# Patient Record
Sex: Male | Born: 1935
Health system: Southern US, Community
[De-identification: ages and names within clinical notes are randomized; demographics above are authoritative.]

## PROBLEM LIST (undated history)

## (undated) DIAGNOSIS — I10 Essential (primary) hypertension: Secondary | ICD-10-CM

## (undated) DIAGNOSIS — J479 Bronchiectasis, uncomplicated: Secondary | ICD-10-CM

## (undated) DIAGNOSIS — M75101 Unspecified rotator cuff tear or rupture of right shoulder, not specified as traumatic: Secondary | ICD-10-CM

## (undated) DIAGNOSIS — K649 Unspecified hemorrhoids: Secondary | ICD-10-CM

## (undated) DIAGNOSIS — K219 Gastro-esophageal reflux disease without esophagitis: Secondary | ICD-10-CM

## (undated) DIAGNOSIS — M7501 Adhesive capsulitis of right shoulder: Secondary | ICD-10-CM

## (undated) DIAGNOSIS — Z8619 Personal history of other infectious and parasitic diseases: Secondary | ICD-10-CM

## (undated) DIAGNOSIS — E119 Type 2 diabetes mellitus without complications: Secondary | ICD-10-CM

## (undated) DIAGNOSIS — M1990 Unspecified osteoarthritis, unspecified site: Secondary | ICD-10-CM

## (undated) DIAGNOSIS — I499 Cardiac arrhythmia, unspecified: Secondary | ICD-10-CM

## (undated) DIAGNOSIS — N402 Nodular prostate without lower urinary tract symptoms: Secondary | ICD-10-CM

## (undated) DIAGNOSIS — F419 Anxiety disorder, unspecified: Secondary | ICD-10-CM

## (undated) DIAGNOSIS — E785 Hyperlipidemia, unspecified: Secondary | ICD-10-CM

## (undated) HISTORY — DX: Nodular prostate without lower urinary tract symptoms: N40.2

## (undated) HISTORY — DX: Type 2 diabetes mellitus without complications: E11.9

## (undated) HISTORY — DX: Unspecified hemorrhoids: K64.9

## (undated) HISTORY — DX: Bronchiectasis, uncomplicated: J47.9

## (undated) HISTORY — DX: Unspecified rotator cuff tear or rupture of right shoulder, not specified as traumatic: M75.101

## (undated) HISTORY — DX: Anxiety disorder, unspecified: F41.9

## (undated) HISTORY — PX: HAND RECONSTRUCTION: SHX1730

## (undated) HISTORY — DX: Essential (primary) hypertension: I10

## (undated) HISTORY — DX: Personal history of other infectious and parasitic diseases: Z86.19

## (undated) HISTORY — DX: Adhesive capsulitis of right shoulder: M75.01

## (undated) HISTORY — DX: Hyperlipidemia, unspecified: E78.5

## (undated) HISTORY — DX: Unspecified osteoarthritis, unspecified site: M19.90

## (undated) HISTORY — DX: Gastro-esophageal reflux disease without esophagitis: K21.9

---

## 1993-06-23 HISTORY — PX: ROTATOR CUFF REPAIR: SHX139

## 1996-06-23 HISTORY — PX: COLONOSCOPY: SHX174

## 1998-04-27 ENCOUNTER — Other Ambulatory Visit: Admission: RE | Admit: 1998-04-27 | Discharge: 1998-04-27 | Payer: Self-pay | Admitting: Urology

## 2003-06-21 ENCOUNTER — Emergency Department (HOSPITAL_COMMUNITY): Admission: EM | Admit: 2003-06-21 | Discharge: 2003-06-21 | Payer: Self-pay | Admitting: Family Medicine

## 2004-06-23 HISTORY — PX: INGUINAL HERNIA REPAIR: SUR1180

## 2004-06-27 ENCOUNTER — Ambulatory Visit: Payer: Self-pay | Admitting: Pulmonary Disease

## 2004-10-24 ENCOUNTER — Ambulatory Visit (HOSPITAL_COMMUNITY): Admission: RE | Admit: 2004-10-24 | Discharge: 2004-10-24 | Payer: Self-pay | Admitting: General Surgery

## 2004-12-12 ENCOUNTER — Ambulatory Visit: Payer: Self-pay | Admitting: Pulmonary Disease

## 2005-05-01 ENCOUNTER — Ambulatory Visit: Payer: Self-pay | Admitting: Pulmonary Disease

## 2006-01-13 ENCOUNTER — Ambulatory Visit: Payer: Self-pay | Admitting: Pulmonary Disease

## 2006-10-21 ENCOUNTER — Ambulatory Visit: Payer: Self-pay | Admitting: Pulmonary Disease

## 2006-10-23 ENCOUNTER — Ambulatory Visit: Payer: Self-pay | Admitting: Pulmonary Disease

## 2006-10-23 LAB — CONVERTED CEMR LAB
ALT: 43 units/L — ABNORMAL HIGH (ref 0–40)
Alkaline Phosphatase: 60 units/L (ref 39–117)
Chloride: 109 meq/L (ref 96–112)
Creatinine, Ser: 1.1 mg/dL (ref 0.4–1.5)
Eosinophils Relative: 2.9 % (ref 0.0–5.0)
GFR calc Af Amer: 84 mL/min
Glucose, Bld: 121 mg/dL — ABNORMAL HIGH (ref 70–99)
HCT: 37.6 % — ABNORMAL LOW (ref 39.0–52.0)
Hemoglobin: 13.2 g/dL (ref 13.0–17.0)
MCHC: 35.2 g/dL (ref 30.0–36.0)
Monocytes Relative: 9 % (ref 3.0–11.0)
Neutro Abs: 3.2 10*3/uL (ref 1.4–7.7)
Neutrophils Relative %: 64.6 % (ref 43.0–77.0)
PSA: 1.91 ng/mL (ref 0.10–4.00)
Potassium: 4.3 meq/L (ref 3.5–5.1)
RBC: 4.1 M/uL — ABNORMAL LOW (ref 4.22–5.81)
RDW: 11.9 % (ref 11.5–14.6)
TSH: 3.03 microintl units/mL (ref 0.35–5.50)
Total Bilirubin: 0.8 mg/dL (ref 0.3–1.2)
Total Protein: 6.7 g/dL (ref 6.0–8.3)
VLDL: 8 mg/dL (ref 0–40)
WBC: 4.8 10*3/uL (ref 4.5–10.5)

## 2007-08-11 ENCOUNTER — Encounter: Payer: Self-pay | Admitting: Pulmonary Disease

## 2007-10-13 ENCOUNTER — Ambulatory Visit: Payer: Self-pay | Admitting: Internal Medicine

## 2007-10-13 DIAGNOSIS — I10 Essential (primary) hypertension: Secondary | ICD-10-CM | POA: Insufficient documentation

## 2007-10-13 DIAGNOSIS — E119 Type 2 diabetes mellitus without complications: Secondary | ICD-10-CM | POA: Insufficient documentation

## 2007-10-13 DIAGNOSIS — Z87898 Personal history of other specified conditions: Secondary | ICD-10-CM | POA: Insufficient documentation

## 2007-11-22 ENCOUNTER — Encounter: Payer: Self-pay | Admitting: Adult Health

## 2007-11-22 ENCOUNTER — Ambulatory Visit: Payer: Self-pay | Admitting: Internal Medicine

## 2007-11-22 LAB — CONVERTED CEMR LAB
Bilirubin Urine: NEGATIVE
Crystals: NEGATIVE
Ketones, ur: NEGATIVE mg/dL
Nitrite: NEGATIVE
Specific Gravity, Urine: 1.02 (ref 1.000–1.03)
pH: 5.5 (ref 5.0–8.0)

## 2007-12-21 ENCOUNTER — Ambulatory Visit: Payer: Self-pay | Admitting: Pulmonary Disease

## 2007-12-21 ENCOUNTER — Telehealth (INDEPENDENT_AMBULATORY_CARE_PROVIDER_SITE_OTHER): Payer: Self-pay | Admitting: *Deleted

## 2007-12-25 DIAGNOSIS — J42 Unspecified chronic bronchitis: Secondary | ICD-10-CM | POA: Insufficient documentation

## 2007-12-25 DIAGNOSIS — K649 Unspecified hemorrhoids: Secondary | ICD-10-CM | POA: Insufficient documentation

## 2007-12-25 DIAGNOSIS — N402 Nodular prostate without lower urinary tract symptoms: Secondary | ICD-10-CM | POA: Insufficient documentation

## 2007-12-25 LAB — CONVERTED CEMR LAB
AST: 29 units/L (ref 0–37)
Albumin: 4 g/dL (ref 3.5–5.2)
Alkaline Phosphatase: 53 units/L (ref 39–117)
BUN: 20 mg/dL (ref 6–23)
Basophils Relative: 0.2 % (ref 0.0–1.0)
Bilirubin, Direct: 0.1 mg/dL (ref 0.0–0.3)
Cholesterol: 130 mg/dL (ref 0–200)
Creatinine, Ser: 1.1 mg/dL (ref 0.4–1.5)
Eosinophils Absolute: 0.2 10*3/uL (ref 0.0–0.7)
Eosinophils Relative: 2.5 % (ref 0.0–5.0)
Glucose, Bld: 96 mg/dL (ref 70–99)
HCT: 39.1 % (ref 39.0–52.0)
Hemoglobin: 13.3 g/dL (ref 13.0–17.0)
Hgb A1c MFr Bld: 6.3 % — ABNORMAL HIGH (ref 4.6–6.0)
Monocytes Absolute: 0.4 10*3/uL (ref 0.1–1.0)
Monocytes Relative: 7 % (ref 3.0–12.0)
Neutro Abs: 4.7 10*3/uL (ref 1.4–7.7)
Neutrophils Relative %: 73.8 % (ref 43.0–77.0)
RDW: 12.8 % (ref 11.5–14.6)
TSH: 1.57 microintl units/mL (ref 0.35–5.50)
Total Protein: 7.3 g/dL (ref 6.0–8.3)

## 2008-07-11 ENCOUNTER — Telehealth (INDEPENDENT_AMBULATORY_CARE_PROVIDER_SITE_OTHER): Payer: Self-pay | Admitting: *Deleted

## 2008-10-13 ENCOUNTER — Encounter: Payer: Self-pay | Admitting: Pulmonary Disease

## 2009-02-12 ENCOUNTER — Ambulatory Visit: Payer: Self-pay | Admitting: Internal Medicine

## 2009-02-22 ENCOUNTER — Ambulatory Visit (HOSPITAL_COMMUNITY): Admission: RE | Admit: 2009-02-22 | Discharge: 2009-02-22 | Payer: Self-pay | Admitting: Orthopedic Surgery

## 2009-06-21 ENCOUNTER — Emergency Department (HOSPITAL_COMMUNITY): Admission: EM | Admit: 2009-06-21 | Discharge: 2009-06-21 | Payer: Self-pay | Admitting: Family Medicine

## 2009-07-09 ENCOUNTER — Telehealth (INDEPENDENT_AMBULATORY_CARE_PROVIDER_SITE_OTHER): Payer: Self-pay | Admitting: *Deleted

## 2009-07-10 ENCOUNTER — Ambulatory Visit: Payer: Self-pay | Admitting: Pulmonary Disease

## 2009-07-11 ENCOUNTER — Encounter: Payer: Self-pay | Admitting: Adult Health

## 2009-07-12 LAB — CONVERTED CEMR LAB
Alkaline Phosphatase: 63 units/L (ref 39–117)
Bilirubin Urine: NEGATIVE
Bilirubin, Direct: 0.1 mg/dL (ref 0.0–0.3)
Calcium: 9.3 mg/dL (ref 8.4–10.5)
Eosinophils Absolute: 0.1 10*3/uL (ref 0.0–0.7)
GFR calc non Af Amer: 83.44 mL/min (ref >60)
HDL: 40.3 mg/dL (ref >39.00)
Leukocytes, UA: NEGATIVE
Lymphocytes Relative: 16.9 % (ref 12.0–46.0)
Lymphs Abs: 0.8 10*3/uL (ref 0.7–4.0)
MCHC: 33.7 g/dL (ref 30.0–36.0)
MCV: 92.9 fL (ref 78.0–100.0)
Neutrophils Relative %: 70.5 % (ref 43.0–77.0)
Platelets: 196 10*3/uL (ref 150.0–400.0)
RBC: 4.04 M/uL — ABNORMAL LOW (ref 4.22–5.81)
Sodium: 139 meq/L (ref 135–145)
Total Bilirubin: 0.8 mg/dL (ref 0.3–1.2)
Total Protein, Urine: NEGATIVE mg/dL
Total Protein: 7.6 g/dL (ref 6.0–8.3)
Triglycerides: 141 mg/dL (ref 0.0–149.0)
Urine Glucose: NEGATIVE mg/dL
Urobilinogen, UA: 0.2 (ref 0.0–1.0)
VLDL: 28.2 mg/dL (ref 0.0–40.0)

## 2009-09-11 ENCOUNTER — Encounter: Payer: Self-pay | Admitting: Pulmonary Disease

## 2009-10-08 ENCOUNTER — Encounter: Admission: RE | Admit: 2009-10-08 | Discharge: 2010-01-06 | Payer: Self-pay | Admitting: Pulmonary Disease

## 2009-10-09 ENCOUNTER — Encounter: Payer: Self-pay | Admitting: Pulmonary Disease

## 2010-02-26 ENCOUNTER — Telehealth (INDEPENDENT_AMBULATORY_CARE_PROVIDER_SITE_OTHER): Payer: Self-pay | Admitting: *Deleted

## 2010-02-28 ENCOUNTER — Ambulatory Visit: Payer: Self-pay | Admitting: Pulmonary Disease

## 2010-03-01 ENCOUNTER — Ambulatory Visit: Payer: Self-pay | Admitting: Pulmonary Disease

## 2010-03-02 LAB — CONVERTED CEMR LAB
ALT: 26 units/L (ref 0–53)
Albumin: 4.1 g/dL (ref 3.5–5.2)
BUN: 24 mg/dL — ABNORMAL HIGH (ref 6–23)
Basophils Absolute: 0 10*3/uL (ref 0.0–0.1)
Bilirubin Urine: NEGATIVE
Chloride: 106 meq/L (ref 96–112)
Creatinine, Ser: 1.1 mg/dL (ref 0.4–1.5)
Eosinophils Relative: 2.7 % (ref 0.0–5.0)
HCT: 36.6 % — ABNORMAL LOW (ref 39.0–52.0)
Hemoglobin, Urine: NEGATIVE
Hemoglobin: 12.6 g/dL — ABNORMAL LOW (ref 13.0–17.0)
Ketones, ur: NEGATIVE mg/dL
Lymphocytes Relative: 22.6 % (ref 12.0–46.0)
Lymphs Abs: 1.2 10*3/uL (ref 0.7–4.0)
MCHC: 34.3 g/dL (ref 30.0–36.0)
MCV: 92.3 fL (ref 78.0–100.0)
Monocytes Relative: 8.8 % (ref 3.0–12.0)
Neutro Abs: 3.4 10*3/uL (ref 1.4–7.7)
Potassium: 4.7 meq/L (ref 3.5–5.1)
RBC: 3.96 M/uL — ABNORMAL LOW (ref 4.22–5.81)
Sodium: 140 meq/L (ref 135–145)
Specific Gravity, Urine: 1.025 (ref 1.000–1.030)
TSH: 1.3 microintl units/mL (ref 0.35–5.50)
Total Protein: 7.1 g/dL (ref 6.0–8.3)
Triglycerides: 66 mg/dL (ref 0.0–149.0)
Urine Glucose: NEGATIVE mg/dL

## 2010-03-04 ENCOUNTER — Encounter: Payer: Self-pay | Admitting: Pulmonary Disease

## 2010-05-02 ENCOUNTER — Telehealth (INDEPENDENT_AMBULATORY_CARE_PROVIDER_SITE_OTHER): Payer: Self-pay | Admitting: *Deleted

## 2010-05-03 ENCOUNTER — Ambulatory Visit: Payer: Self-pay | Admitting: Pulmonary Disease

## 2010-07-01 ENCOUNTER — Encounter: Payer: Self-pay | Admitting: Pulmonary Disease

## 2010-07-23 NOTE — Letter (Signed)
Summary: [facility]   Imported By: Lennie Odor 10/16/2009 14:00:32  _____________________________________________________________________  External Attachment:    Type:   Image     Comment:   External Document

## 2010-07-23 NOTE — Letter (Signed)
Summary: [facility]  [facility]   Imported By: Lester Lititz 03/12/2010 09:41:24  _____________________________________________________________________  External Attachment:    Type:   Image     Comment:   External Document

## 2010-07-23 NOTE — Letter (Signed)
Summary: [facility]   Imported By: Sherian Rein 09/19/2009 13:56:51  _____________________________________________________________________  External Attachment:    Type:   Image     Comment:   External Document

## 2010-07-23 NOTE — Assessment & Plan Note (Signed)
Summary: rov/ mbw   CC:  follow up for health assesment for [facility] insurance.  c/o dry cough x2days.  Pt is fasting today.Marland Kitchen  History of Present Illness: 77   year old male with known history of DM, HTN, GERD    07/10/09--Presents for routine follow up. Needs work [employer] education [employer]. Has not been in for some time. Says he has been doing very well. Still working full time. Loves to work he says. Does not check blood sugars at home. Labs are due today- he is fasting. Denies chest pain, dyspnea, orthopnea, hemoptysis, fever, n/v/d, edema, headache.   Current Medications (verified): 1)  Bayer Childrens Aspirin 81 Mg Chew (Aspirin) .... Once Daily 2)  Lisinopril 10 Mg  Tabs (Lisinopril) .Marland Kitchen.. 1 By Mouth Once Daily 3)  Lipitor 20 Mg  Tabs (Atorvastatin Calcium) .... Take 1 Tablet By Mouth Once A Day 4)  Glucophage 500 Mg  Tabs (Metformin Hcl) .... Take 1 Tablet By Mouth Two Times A Day 5)  Gnp Omeprazole 20 Mg Tbec (Omeprazole) .... Take 1 Tablet By Mouth Once A Day 6)  Multivitamins   Tabs (Multiple Vitamin) .... Once Daily  Allergies (verified): No Known Drug Allergies  Past History:  Past Surgical History: Last updated: 12/21/2007 S/P left rotator cuff surgery in 1995 by DrPresson S/P right inguinal hernia repair in 2006 by DrBallen  Family History: Last updated: 02/12/2009 emphysema - cousin heart disease - father died from MI at 46  Social History: Last updated: 02/12/2009 Works for security, [employer] full time Married  never smoked no alcohol no caffeine  Risk Factors: Smoking Status: never (10/13/2007)  Past Medical History: Hx of BRONCHITIS, RECURRENT (ICD-491.9) - he is a never smoker... no recent bronchitic symptoms...  HYPERTENSION (ICD-401.9) - on LISINOPRIL 10mg /d...    HYPERLIPIDEMIA (ICD-272.4) - on LIPITOR 20mg /d... tolerating well...  ~  FLP 7/07 showed TChol 114, TG 47, HDL 41, LDL 64...  ~  FLP 5/08 showed TChol 107, TG 39, HDL 37, LDL 63...  DIABETES  MELLITUS (ICD-250.00) - on METFORMIN 500mg Bid...   ~  labs 6/06 showed BS= 126, HgA1c= 6.1.Marland Kitchen.  ~  labs 5/08 showed BS= 121, HgA1c= 6.8...  GERD (ICD-530.81) - on PRILOSEC 20mg /d...   Hx of HEMORRHOIDS (ICD-455.6) - last colonoscopy 6/98 by Dorris Singh was a normal study and f/u suggested in 10 yrs.  Hx of NODULAR PROSTATE WITHOUT URINARY OBSTRUCTION (ICD-600.10) - hx of nodular prostate w/ neg biopsies by DrNesi in 1999...  DEGENERATIVE JOINT DISEASE (ICD-715.90)  ~  s/p left rotator cuff repair 1995 by DrPresson...  ~  hx of distal left radius fracture in 2000 set by DrDuda...  ~  eval for back pain by DrBrooks 8/08... rec phys therapy & improved...  ANXIETY (ICD-300.00)  SHINGLES, HX OF (ICD-V13.8) - hx of right L1 shingles in 2004... also hx of seb dermatitis [facility] in [facility]...    Review of Systems      See HPI  Vital Signs:  Patient profile:   77 year old male Height:      71 inches Weight:      179.13 pounds BMI:     25.07 O2 Sat:      98 % on Room air Temp:     97.6 degrees F oral Pulse rate:   81 / minute BP sitting:   126 / 70  (left arm) Cuff size:   regular  Vitals Entered By: Gweneth Dimitri RN (July 10, 2009 2:59 PM)  O2 Flow:  Room air CC: follow up for health assesment for [facility] insurance.  c/o dry cough x2days.  Pt is fasting today. Is Patient Diabetic? Yes Comments Medications reviewed with patient phone number verified with pt Gweneth Dimitri RN  July 10, 2009 3:00 PM    Physical Exam  Additional Exam:  WD, WN, 77y/o BM in NAD... GENERAL:  Alert & oriented; [place] & cooperative... HEENT:  [place]/AT, EOM-wnl, PERRLA, EACs-clear, TMs-wnl, NOSE-clear, THROAT-clear & wnl. NECK:  Supple w/ full ROM; no JVD; normal carotid impulses w/o bruits; no thyromegaly or nodules palpated; no lymphadenopathy. CHEST:  Clear to P & A; without wheezes/ rales/ or rhonchi. HEART:  Regular Rhythm; without murmurs/ rubs/ or gallops. ABDOMEN:  Soft & nontender; normal  bowel sounds; no organomegaly or masses detected.  EXT: without deformities, mild arthritic changes; no varicose veins/ venous insuffic/ or edema. NEURO:  CN's intact; no focal neuro deficits... DERM:  No lesions noted; no rash etc...     Impression & Recommendations:  Problem # 1:  DIABETES MELLITUS (ICD-250.00) diet rx  labs pending.  health assessment sheet completed.  His updated medication list for this problem includes:    Bayer Childrens Aspirin 81 Mg Chew (Aspirin) ..... Once daily    Glucophage 500 Mg Tabs (Metformin hcl) .Marland Kitchen... Take 1 tablet by mouth two times a day  Orders: TLB-A1C / Hgb A1C (Glycohemoglobin) (83036-A1C) TLB-Udip ONLY (81003-UDIP) TLB-PSA (Prostate Specific Antigen) (84153-PSA) Est. Patient Level IV (78295)  Problem # 2:  HYPERTENSION (ICD-401.9) controlled on rx  His updated medication list for this problem includes:    Lisinopril 10 Mg Tabs (Lisinopril) .Marland Kitchen... 1 by mouth once daily  Orders: TLB-BMP (Basic Metabolic Panel-BMET) (80048-METABOL) TLB-CBC Platelet - w/Differential (85025-CBCD) TLB-PSA (Prostate Specific Antigen) (84153-PSA) Est. Patient Level IV (62130)  BP today: 126/70 Prior BP: 134/74 (02/12/2009)  Labs Reviewed: K+: 4.2 (12/21/2007) Creat: : 1.1 (12/21/2007)   Chol: 130 (12/21/2007)   HDL: 51.8 (12/21/2007)   LDL: 69 (12/21/2007)   TG: 46 (12/21/2007)  Problem # 3:  HYPERLIPIDEMIA (ICD-272.4) LDL goal <70-100 at goal cont on same meds along diet and exercise.  His updated medication list for this problem includes:    Lipitor 20 Mg Tabs (Atorvastatin calcium) .Marland Kitchen... Take 1 tablet by mouth once a day  Orders: TLB-Hepatic/Liver Function Pnl (80076-HEPATIC) TLB-Lipid Panel (80061-LIPID) TLB-PSA (Prostate Specific Antigen) (84153-PSA) Est. Patient Level IV (86578)  Labs Reviewed: SGOT: 29 (12/21/2007)   SGPT: 29 (12/21/2007)   HDL:51.8 (12/21/2007), 36.6 (10/23/2006)  LDL:69 (12/21/2007), 63 (10/23/2006)  Chol:130  (12/21/2007), 107 (10/23/2006)  Trig:46 (12/21/2007), 39 (10/23/2006)  Problem # 4:  DIABETES MELLITUS (ICD-250.00) at goal labs pending  diet and exercise.  His updated medication list for this problem includes:    Bayer Childrens Aspirin 81 Mg Chew (Aspirin) ..... Once daily    Glucophage 500 Mg Tabs (Metformin hcl) .Marland Kitchen... Take 1 tablet by mouth two times a day  Orders: TLB-A1C / Hgb A1C (Glycohemoglobin) (83036-A1C) TLB-Udip ONLY (81003-UDIP) TLB-PSA (Prostate Specific Antigen) (84153-PSA) Est. Patient Level IV (46962)  Labs Reviewed: Creat: 1.1 (12/21/2007)    Reviewed HgBA1c results: 6.3 (12/21/2007)  6.8 (10/23/2006)  Medications Added to Medication List This Visit: 1)  Bayer Childrens Aspirin 81 Mg Chew (Aspirin) .... Once daily  Complete Medication List: 1)  Bayer Childrens Aspirin 81 Mg Chew (Aspirin) .... Once daily 2)  Lisinopril 10 Mg Tabs (Lisinopril) .Marland Kitchen.. 1 by mouth once daily 3)  Lipitor 20 Mg Tabs (Atorvastatin calcium) .... Take 1 tablet by mouth  once a day 4)  Glucophage 500 Mg Tabs (Metformin hcl) .... Take 1 tablet by mouth two times a day 5)  Gnp Omeprazole 20 Mg Tbec (Omeprazole) .... Take 1 tablet by mouth once a day 6)  Multivitamins Tabs (Multiple vitamin) .... Once daily  Patient Instructions: 1)  Continue on same meds.  2)  Mucinex DM two times a day as needed cough/congestion 3)  I will call with lab results.  4)  Please contact office for sooner follow up if symptoms do not improve or worsen

## 2010-07-23 NOTE — Progress Notes (Signed)
Summary: ELEVATED bun  Phone Note Call from Patient   Caller: Patient Call For: NADEL Summary of Call: PT CALLED BUT HAD HIS CO-WORKER LYNN BERRYHIL SPEAK FOR HIM RE: RECENT LABS DONE AT [facility] (A MONTH AGO) FOR EMPLOYEE HEALTH SCREENING. BUN WAS ELEVATED- 120.0. CALL PT ON HIS CELL [phone]. LYNN CAN BE REACHED AT [phone] OR CELL [phone] (AGAIN- SHE IS NOT A NURSE BUT PT'S CO WORKER WHO ENCOURAGED HIM TO GET DR NADEL TO CHECK THIS OUT).  Initial call taken by: Tivis Ringer, CNA,  May 02, 2010 10:36 AM  Follow-up for Phone Call        Pt reports that his BUN was 120 at his health screening lab appt for [facility].  Last BUN in 02/2010 was 24.  Pt denies problems.  Pt sees Dr Brunilda Payor for BPH .  Please advise if pt to see Kriste Basque or needs f/u with Nesi. Abigail Miyamoto RN  May 02, 2010 10:50 AM   Additional Follow-up for Phone Call Additional follow up Details #1::        per SN---recs are for pt to bring [place] a copy of his health screening and while he is here we will send him to the lab for repeat BMP--401.9  thanks Randell Loop [facility]  May 02, 2010 12:13 PM     Additional Follow-up for Phone Call Additional follow up Details #2::    Clarification:  Pt had health screening labs faxed to our office (see attached) and BUN was ony 28 and Glucose was 120.  Pt got the two mixed up.  Please advise if pt should still have repeat BMP. Abigail Miyamoto RN  May 02, 2010 1:28 PM   Per Dr Kriste Basque - No need to have labs repeated.  Levels okay.  Pt informed. Abigail Miyamoto RN  May 02, 2010 1:33 PM

## 2010-07-23 NOTE — Progress Notes (Signed)
Summary: needs an apt to see nadel  Phone Note Call from Patient Call back at Home Phone [phone]   Caller: Patient Call For: nadel Summary of Call: patient works for [employer]. he did not get the health assesment through [place] (the buss). his insurance went up. he was told he had to come here and get his health assement. is there any way we can get him in any sooner than march.  Initial call taken by: Valinda Hoar,  July 09, 2009 12:16 PM  Follow-up for Phone Call        Please advise thanks Vernie Murders  July 09, 2009 12:18 PM'   Additional Follow-up for Phone Call Additional follow up Details #1::        ok to come in on 2-21 at 3---tel him to call 1 week prior to ov and he can come in for labs. Randell Loop [facility]  July 09, 2009 12:31 PM   pt states he has to have paperwork in to [employer] by Feb. 3rd. Is there and earlier appt. Thanks. Carron Curie CMA  July 09, 2009 12:54 PM     Additional Follow-up for Phone Call Additional follow up Details #2::    the only other day that i can offer is 2-2 at 3:30 unless he wants to see TP.  thanks Randell Loop CMA  July 09, 2009 1:00 PM   Called to offer appt.  Pt's spouse had already sched appt for tommorrow 1/18 with TP. Follow-up by: Vernie Murders,  July 09, 2009 1:54 PM

## 2010-07-23 NOTE — Assessment & Plan Note (Signed)
Summary: ov to get meds refilled//td   CC:  2 year ROV & review of medical problems....  History of Present Illness: 77 y/o BM here for a follow up visit... he has multiple medical problems as noted below...    ~  March 01, 2010:  2 yr ROV- doing well he says w/o new complaints or concerns... mild arthritic symptoms but still works full time at 58 as [employer]- walking, stairs, etc... denies CP, palpit, SOB, cough, phlegm, GI or GU symptoms... he states that BP, Chol, BS, etc are all doing well... he will ret for fasting labs & CXR>    Current Problem List:  Hx of BRONCHITIS, RECURRENT (ICD-491.9) - he is a never smoker... no recent bronchitic symptoms...  ~  CXR 6/09 showed hyperaeration, clear, NAD- DJD in spine.  ~  CXR 9/11> pt forgot to go to basement for film (too busy & had to get back to work).  HYPERTENSION (ICD-401.9) - on LISINOPRIL 10mg /d... BP= 142/70 and doing well... denies HA, fatigue, visual changes, CP, palipit, dizziness, syncope, dyspnea, edema, etc...  HYPERLIPIDEMIA (ICD-272.4) - on LIPITOR 20mg /d... tolerating well...  ~  FLP 7/07 showed TChol 114, TG 47, HDL 41, LDL 64  ~  FLP 5/08 showed TChol 107, TG 39, HDL 37, LDL 63  ~  FLP 6/09 showed TChol 130, TG 46, HDL 52, LDL 69  ~  FLP 1/11 showed TChol 160, TG 141, HDL 40, LDL 92  ~  FLP 9/11 showed TChol 123, TG 66, HDL 37, LDL 73  DIABETES MELLITUS (ICD-250.00) - on METFORMIN 500mg Bid...   ~  labs 6/06 showed BS= 126, HgA1c= 6.1  ~  labs 5/08 showed BS= 121, HgA1c= 6.8  ~  labs 6/09 showed BS= 96, A1c= 6.3  ~  labs 1/11 showed BS= 111, A1c= 7.2  ~  labs 9/11 showed BS= 94, A1c= 7.5.Marland KitchenMarland Kitchen needs better diet, get wt down, or incr meds!  GERD (ICD-530.81) - prev Prilosec OTC changed to NEXIUM 40mg /d ("it's free at hosp pharm").  Hx of HEMORRHOIDS (ICD-455.6) - last colonoscopy 6/98 by Dorris Singh was a normal study and f/u suggested in 10 yrs & he is overdue> refer to GI.  Hx of NODULAR PROSTATE  WITHOUT URINARY OBSTRUCTION (ICD-600.10) - hx of nodular prostate w/ neg biopsies by DrNesi in 1999...   ~  9/11: he reports yearly f/u DrNesi due next week, ?on FLOMAX 0.4mg /d- we don't have records.  DEGENERATIVE JOINT DISEASE (ICD-715.90) - he uses OTC anti-inflamm meds Prn.  ~  s/p left rotator cuff repair 1995 by DrPresson...  ~  hx of distal left radius fracture in 2000 set by DrDuda...  ~  eval for back pain by DrBrooks 8/08... rec phys therapy & improved...  ANXIETY (ICD-300.00)  SHINGLES, HX OF (ICD-V13.8) - hx of right L1 shingles in 2004... also hx of seb dermatitis eval at [facility] in 2002...   Preventive Screening-Counseling & Management  Alcohol-Tobacco     Smoking Status: never  Allergies (verified): No Known Drug Allergies  Comments:  Nurse/Medical Assistant: The patient's medications and allergies were reviewed with the patient and were updated in the Medication and Allergy Lists.  Past History:  Past Medical History: Hx of BRONCHITIS, RECURRENT (ICD-491.9) HYPERTENSION (ICD-401.9) HYPERLIPIDEMIA (ICD-272.4) DIABETES MELLITUS (ICD-250.00) GERD (ICD-530.81) Hx of HEMORRHOIDS (ICD-455.6) Hx of NODULAR PROSTATE WITHOUT URINARY OBSTRUCTION (ICD-600.10) DEGENERATIVE JOINT DISEASE (ICD-715.90) ANXIETY (ICD-300.00) SHINGLES, HX OF (ICD-V13.8)  Past Surgical History: S/P left rotator cuff surgery in 1995 by  DrPresson S/P right inguinal hernia repair in 2006 by DrBallen  Family History: Reviewed history from 02/12/2009 and no changes required. emphysema - cousin heart disease - father died from MI at 57  Social History: Reviewed history from 02/12/2009 and no changes required. Works for security, [employer] full time Married  never smoked no alcohol no caffeine  Review of Systems       The patient complains of nocturia and arthritis.  The patient denies fever, chills, sweats, anorexia, fatigue, weakness, malaise, weight loss, sleep disorder, blurring,  diplopia, eye irritation, eye discharge, vision loss, eye pain, photophobia, earache, ear discharge, tinnitus, decreased hearing, nasal congestion, nosebleeds, sore throat, hoarseness, chest pain, palpitations, syncope, dyspnea on exertion, orthopnea, PND, peripheral edema, cough, dyspnea at rest, excessive sputum, hemoptysis, wheezing, pleurisy, nausea, vomiting, diarrhea, constipation, change in bowel habits, abdominal pain, melena, hematochezia, jaundice, gas/bloating, indigestion/heartburn, dysphagia, odynophagia, dysuria, hematuria, urinary frequency, urinary hesitancy, incontinence, back pain, joint pain, joint swelling, muscle cramps, muscle weakness, stiffness, sciatica, restless legs, leg pain at night, leg pain with exertion, rash, itching, dryness, suspicious lesions, paralysis, paresthesias, seizures, tremors, vertigo, transient blindness, frequent falls, frequent headaches, difficulty walking, depression, anxiety, memory loss, confusion, cold intolerance, heat intolerance, polydipsia, polyphagia, polyuria, unusual weight change, abnormal bruising, bleeding, enlarged lymph nodes, urticaria, allergic rash, hay fever, and recurrent infections.    Vital Signs:  Patient profile:   77 year old male Height:      71 inches Weight:      178.50 pounds BMI:     24.99 O2 Sat:      97 % on Room air Temp:     97.0 degrees F oral Pulse rate:   95 / minute BP sitting:   142 / 70  (left arm) Cuff size:   regular  Vitals Entered By: Randell Loop CMA (February 28, 2010 11:37 AM)  O2 Sat at Rest %:  97 O2 Flow:  Room air CC: 2 year ROV & review of medical problems... Is Patient Diabetic? Yes Pain Assessment Patient in pain? no      Comments meds updated today with pt   Physical Exam  Additional Exam:  WD, WN, 77 y/o BM in NAD... GENERAL:  Alert & oriented; [place] & cooperative... HEENT:  [place]/AT, EOM-wnl, PERRLA, EACs-clear, TMs-wnl, NOSE-clear, THROAT-clear & wnl. NECK:  Supple w/  fairROM; no JVD; normal carotid impulses w/o bruits; no thyromegaly or nodules palpated; no lymphadenopathy. CHEST:  Clear to P & A; without wheezes/ rales/ or rhonchi. HEART:  Regular Rhythm; without murmurs/ rubs/ or gallops. ABDOMEN:  Soft & nontender; normal bowel sounds; no organomegaly or masses detected.  EXT: without deformities, mild arthritic changes; no varicose veins/ venous insuffic/ or edema. NEURO:  CN's intact; no focal neuro deficits... DERM:  No lesions noted; no rash etc...    MISC. Report  Procedure date:  03/01/2010  Findings:      Lipid Panel (LIPID)   Cholesterol               123 mg/dL                   9-563   Triglycerides             66.0 mg/dL                  8.7-564.3   HDL                  [L]  32.95 mg/dL                 >  39.00   LDL Cholesterol           73 mg/dL                    1-69  Hepatic/Liver Function Panel (HEPATIC)   Total Bilirubin           1.0 mg/dL                   6.7-8.9   Direct Bilirubin          0.2 mg/dL                   3.8-1.0   Alkaline Phosphatase      59 U/L                      39-117   AST                       28 U/L                      0-37   ALT                       26 U/L                      0-53   Total Protein             7.1 g/dL                    1.7-5.1   Albumin                   4.1 g/dL                    0.2-5.8  BMP (METABOL)   Sodium                    140 mEq/L                   135-145   Potassium                 4.7 mEq/L                   3.5-5.1   Chloride                  106 mEq/L                   96-112   Carbon Dioxide            27 mEq/L                    19-32   Glucose                   94 mg/dL                    52-77   BUN                  [H]  24 mg/dL                    8-24   Creatinine                1.1 mg/dL  0.4-1.5   Calcium                   9.2 mg/dL                   1.6-10.9   GFR                       79.94 mL/min                >60   Hemoglobin A1C  (A1C)   Hemoglobin A1C       [H]  7.5 %                       4.6-6.5  Comments:      CBC Platelet w/Diff (CBCD)   White Cell Count          5.2 K/uL                    4.5-10.5   Red Cell Count       [L]  3.96 Mil/uL                 4.22-5.81   Hemoglobin           [L]  12.6 g/dL                   60.4-54.0   Hematocrit           [L]  36.6 %                      39.0-52.0   MCV                       92.3 fl                     78.0-100.0   Platelet Count            176.0 K/uL                  150.0-400.0   Neutrophil %              65.5 %                      43.0-77.0   Lymphocyte %              22.6 %                      12.0-46.0   Monocyte %                8.8 %                       3.0-12.0   Eosinophils%              2.7 %                       0.0-5.0   Basophils %               0.4 %                       0.0-3.0   TSH (TSH)   FastTSH  1.30 uIU/mL                 0.35-5.50  Prostate Specific Antigen (PSA)   PSA-Hyb                   1.27 ng/mL                  0.10-4.00  UDip Only (UDIP)   Color                     LT. YELLOW   Clarity                   CLEAR                       Clear   Specific Gravity          1.025                       1.000 - 1.030   Urine Ph                  5.5                         5.0-8.0   Protein                   NEGATIVE                    Negative   Urine Glucose             NEGATIVE                    Negative   Ketones                   NEGATIVE                    Negative   Urine Bilirubin           NEGATIVE                    Negative   Blood                     NEGATIVE                    Negative   Urobilinogen              0.2                         0.0 - 1.0   Leukocyte Esterace        NEGATIVE                    Negative   Nitrite                   NEGATIVE                    Negative   Impression & Recommendations:  Problem # 1:  DEGENERATIVE JOINT DISEASE (ICD-715.90) 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 he uses OTC anti-inflamm Prn & doesn't want stronger med. His updated medication list for this problem includes:    Bayer Childrens Aspirin 81 Mg Chew (Aspirin) .Marland KitchenMarland KitchenMarland KitchenMarland Kitchen  Once daily  Problem # 2:  HYPERTENSION (ICD-401.9) Controlled>  same meds + diet (no salt, get wt down)... His updated medication list for this problem includes:    Lisinopril 10 Mg Tabs (Lisinopril) .Marland Kitchen... 1 by mouth once daily  Problem # 3:  HYPERLIPIDEMIA (ICD-272.4) Lipids well controlled on diet + Lip20... His updated medication list for this problem includes:    Lipitor 20 Mg Tabs (Atorvastatin calcium) .Marland Kitchen... Take 1 tablet by mouth once a day  Problem # 4:  DIABETES MELLITUS (ICD-250.00) His BS/ A1c were up w/ weight gain> hopefully won't need incr meds, just diet/ exerc/ get wt down... His updated medication list for this problem includes:    Bayer Childrens Aspirin 81 Mg Chew (Aspirin) ..... Once daily    Lisinopril 10 Mg Tabs (Lisinopril) .Marland Kitchen... 1 by mouth once daily    Glucophage 500 Mg Tabs (Metformin hcl) .Marland Kitchen... Take 1 tablet by mouth two times a day  Problem # 5:  GERD (ICD-530.81) Stable>  continue Nexium... His updated medication list for this problem includes:    Nexium 40 Mg Cpdr (Esomeprazole magnesium) .Marland Kitchen... Take one tablet by mouth once daily  Problem # 6:  Hx of NODULAR PROSTATE WITHOUT URINARY OBSTRUCTION (ICD-600.10) Followed by Thermon Leyland w/ appt due next week he says... asked to request notes to [place] for review. His updated medication list for this problem includes:    Tamsulosin Hcl 0.4 Mg Caps (Tamsulosin hcl) .Marland Kitchen... Take 1 tab by mouth once daily as directed by drnesi...  Problem # 7:  ANXIETY (ICD-300.00) He manages quite well & does not want anxiolytic med.  Problem # 8:  OTHER MEDICAL PROBLEMS AS NOTED>>>  Complete Medication List: 1)  Bayer Childrens Aspirin 81 Mg Chew (Aspirin) .... Once daily 2)  Lisinopril 10 Mg Tabs (Lisinopril) .Marland Kitchen.. 1 by mouth once  daily 3)  Lipitor 20 Mg Tabs (Atorvastatin calcium) .... Take 1 tablet by mouth once a day 4)  Glucophage 500 Mg Tabs (Metformin hcl) .... Take 1 tablet by mouth two times a day 5)  Nexium 40 Mg Cpdr (Esomeprazole magnesium) .... Take one tablet by mouth once daily 6)  Multivitamins Tabs (Multiple vitamin) .... Once daily 7)  Vitamin D 1000 Unit Tabs (Cholecalciferol) .... Take 1 tablet by mouth once a day 8)  Fluocinonide 0.05 % Gel (Fluocinonide) .... Apply as directed 9)  Tamsulosin Hcl 0.4 Mg Caps (Tamsulosin hcl) .... Take 1 tab by mouth once daily as directed by drnesi...  Patient Instructions: 1)  Today we updated your med list- see below.... 2)  We refilled your meds per request... 3)  Please return to our lab [facility] for your FASTING blood work, and your CXR.Marland KitchenMarland Kitchen  4)  Thwen please call the "phone tree" in a few days for your lab results.Marland KitchenMarland Kitchen  5)  Let's work on getting those 20 lbs back off... 6)  Call for any questions.Marland KitchenMarland Kitchen 7)  Please schedule a follow-up appointment in 1 year, sooner as needed... Prescriptions: NEXIUM 40 MG CPDR (ESOMEPRAZOLE MAGNESIUM) take one tablet by mouth once daily  #90 x 4   Entered and Authorized by:   Michele Mcalpine MD   Signed by:   Michele Mcalpine MD on 02/28/2010   Method used:   Print then Give to Patient   RxID:   [id number] GLUCOPHAGE 500 MG  TABS (METFORMIN HCL) Take 1 tablet by mouth two times a day  #180 x 4   Entered and Authorized by:   Lonzo Cloud  Kriste Basque MD   Signed by:   Michele Mcalpine MD on 02/28/2010   Method used:   Print then Give to Patient   RxID:   [id number] LIPITOR 20 MG  TABS (ATORVASTATIN CALCIUM) Take 1 tablet by mouth once a day  #90 x 4   Entered and Authorized by:   Michele Mcalpine MD   Signed by:   Michele Mcalpine MD on 02/28/2010   Method used:   Print then Give to Patient   RxID:   [id number] LISINOPRIL 10 MG  TABS (LISINOPRIL) 1 by mouth once daily  #90 x 4   Entered and Authorized by:   Michele Mcalpine MD   Signed  by:   Michele Mcalpine MD on 02/28/2010   Method used:   Print then Give to Patient   RxID:   [id number]    Immunization History:  Influenza Immunization History:    Influenza:  historical (05/16/2009)  Pneumovax Immunization History:    Pneumovax:  historical (03/18/2006)

## 2010-07-23 NOTE — Letter (Signed)
Summary: [facility]  [facility]   Imported By: Lester Grasston 07/17/2009 12:25:52  _____________________________________________________________________  External Attachment:    Type:   Image     Comment:   External Document

## 2010-07-23 NOTE — Progress Notes (Signed)
Summary: refill appt needed asap  Phone Note Call from Patient Call back at [phone]   Caller: Patient Call For: nadel Summary of Call: pt needs an appt to see nadel cant do morning of 02/27/2010  needs refills on his diabetic meds Initial call taken by: Lacinda Axon,  February 26, 2010 12:18 PM  Follow-up for Phone Call        Pt was last seen by TP 07/10/2009 but hasn't seen SN since 12/21/2007.  Per append to refill request on Metformin, pt needs ov with SN before refills will be given.  No appts avail with SN.  Please advise where we can add pt on to his schedule.  Thank you.  Aundra Millet Reynolds LPN  February 26, 2010 12:24 PM   Additional Follow-up for Phone Call Additional follow up Details #1::        SN has 2 openings on thursday of this week at 11:30 and 12.  either one is fine to use.  thanks Randell Loop CMA  February 26, 2010 12:28 PM   scheduled pt to see dr Kriste Basque thursday at 11:30 Additional Follow-up by: Philipp Deputy CMA,  February 26, 2010 12:34 PM

## 2010-07-25 NOTE — Letter (Signed)
Summary: [facility]  [facility]   Imported By: Sherian Rein 07/10/2010 08:01:26  _____________________________________________________________________  External Attachment:    Type:   Image     Comment:   External Document

## 2010-10-18 ENCOUNTER — Other Ambulatory Visit: Payer: Self-pay | Admitting: Pulmonary Disease

## 2010-12-02 ENCOUNTER — Other Ambulatory Visit: Payer: Self-pay | Admitting: Pulmonary Disease

## 2011-01-16 ENCOUNTER — Other Ambulatory Visit (INDEPENDENT_AMBULATORY_CARE_PROVIDER_SITE_OTHER): Payer: 59

## 2011-01-16 ENCOUNTER — Encounter: Payer: Self-pay | Admitting: Adult Health

## 2011-01-16 ENCOUNTER — Ambulatory Visit (INDEPENDENT_AMBULATORY_CARE_PROVIDER_SITE_OTHER): Payer: 59 | Admitting: Adult Health

## 2011-01-16 DIAGNOSIS — E119 Type 2 diabetes mellitus without complications: Secondary | ICD-10-CM

## 2011-01-16 DIAGNOSIS — T148 Other injury of unspecified body region: Secondary | ICD-10-CM

## 2011-01-16 DIAGNOSIS — T148XXA Other injury of unspecified body region, initial encounter: Secondary | ICD-10-CM

## 2011-01-16 DIAGNOSIS — E785 Hyperlipidemia, unspecified: Secondary | ICD-10-CM

## 2011-01-16 DIAGNOSIS — I10 Essential (primary) hypertension: Secondary | ICD-10-CM

## 2011-01-16 DIAGNOSIS — W57XXXA Bitten or stung by nonvenomous insect and other nonvenomous arthropods, initial encounter: Secondary | ICD-10-CM

## 2011-01-16 LAB — BASIC METABOLIC PANEL
CO2: 25 mEq/L (ref 19–32)
Calcium: 8.9 mg/dL (ref 8.4–10.5)
Chloride: 104 mEq/L (ref 96–112)
Creatinine, Ser: 1.4 mg/dL (ref 0.4–1.5)
GFR: 62.92 mL/min (ref >60.00)
Glucose, Bld: 139 mg/dL — ABNORMAL HIGH (ref 70–99)
Potassium: 4.5 mEq/L (ref 3.5–5.1)
Sodium: 135 mEq/L (ref 135–145)

## 2011-01-16 LAB — LIPID PANEL
LDL Cholesterol: 77 mg/dL (ref 0–99)
Total CHOL/HDL Ratio: 4
Triglycerides: 89 mg/dL (ref 0.0–149.0)
VLDL: 17.8 mg/dL (ref 0.0–40.0)

## 2011-01-16 LAB — HEPATIC FUNCTION PANEL
Albumin: 4.2 g/dL (ref 3.5–5.2)
Total Bilirubin: 1.2 mg/dL (ref 0.3–1.2)

## 2011-01-16 LAB — CBC WITH DIFFERENTIAL/PLATELET
Eosinophils Absolute: 0.1 10*3/uL (ref 0.0–0.7)
HCT: 37 % — ABNORMAL LOW (ref 39.0–52.0)
Hemoglobin: 12.6 g/dL — ABNORMAL LOW (ref 13.0–17.0)
Lymphocytes Relative: 20.9 % (ref 12.0–46.0)
MCV: 92 fl (ref 78.0–100.0)
Neutro Abs: 2.6 10*3/uL (ref 1.4–7.7)
Neutrophils Relative %: 59.9 % (ref 43.0–77.0)

## 2011-01-16 LAB — HEMOGLOBIN A1C: Hgb A1c MFr Bld: 7.9 % — ABNORMAL HIGH (ref 4.6–6.5)

## 2011-01-16 MED ORDER — DOXYCYCLINE HYCLATE 100 MG PO CAPS: 100.0000 mg | ORAL_CAPSULE | Freq: Two times a day (BID) | ORAL | Status: AC

## 2011-01-16 NOTE — Patient Instructions (Signed)
Doxycycline 100mg  Twice daily  For 7 days  I will call with lab results.  Low sweet diet . Low cholestrol  Follow up with Dr. Kriste Basque  In 3 months and As needed

## 2011-01-16 NOTE — Assessment & Plan Note (Signed)
Diet and exercise discussed  Labs pending.

## 2011-01-16 NOTE — Assessment & Plan Note (Signed)
Labs pending Diet and exercise discussed.

## 2011-01-16 NOTE — Assessment & Plan Note (Signed)
Controlled on rx  Labs pending  

## 2011-01-16 NOTE — Assessment & Plan Note (Signed)
No obvious rash  Will tx empirically with doxycycline x 7 days

## 2011-01-16 NOTE — Progress Notes (Signed)
Subjective:    Patient ID: Dale Anderson, male    DOB: [birth year], 77 y.o.   MRN: [id number]  HPI 77 yo AAM with known hx of HTN, GERD, and DM   01/16/2011 Acute OV  Pt complains of pt states he removed 2 ticks- R shoulder and R side of abd. Was working in garden. Tick was not engorged but attached.  Pt states bite on abd is red  but denies pain or swelling.  C/o body "stiffness/weakness" and body chills.  Denies fever or sweats.  He says he is feeling better. Wife wants him checked out. Has not had labs since last year. Says blood sugars average ~100.  No rash , joint swelling or fever. No neck stiffness.    PMH:   Hx of BRONCHITIS, RECURRENT (ICD-491.9) - he is a never smoker... no recent bronchitic symptoms...  ~ CXR 6/09 showed hyperaeration, clear, NAD- DJD in spine.  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   HYPERTENSION (ICD-401.9) - on LISINOPRIL 10mg /d...    HYPERLIPIDEMIA (ICD-272.4) - on LIPITOR 20mg /d... tolerating well...  ~ FLP 7/07 showed TChol 114, TG 47, HDL 41, LDL 64  ~ FLP 5/08 showed TChol 107, TG 39, HDL 37, LDL 63  ~ FLP 6/09 showed TChol 130, TG 46, HDL 52, LDL 69  ~ FLP 1/11 showed TChol 160, TG 141, HDL 40, LDL 92  ~ FLP 9/11 showed TChol 123, TG 66, HDL 37, LDL 73   DIABETES MELLITUS (ICD-250.00) - on METFORMIN 500mg Bid...  ~ labs 6/06 showed BS= 126, HgA1c= 6.1  ~ labs 5/08 showed BS= 121, HgA1c= 6.8  ~ labs 6/09 showed BS= 96, A1c= 6.3  ~ labs 1/11 showed BS= 111, A1c= 7.2  ~ labs 9/11 showed BS= 94, A1c= 7.5.Marland KitchenMarland Kitchen needs better diet, get wt down, or incr meds!   GERD (ICD-530.81) - prev Prilosec OTC changed to NEXIUM 40mg /d ("it's free at hosp pharm").   Hx of HEMORRHOIDS (ICD-455.6) - last colonoscopy 6/98 by Dorris Singh was a normal study and f/u suggested in 10 yrs & he is overdue> refer to GI.   Hx of NODULAR PROSTATE WITHOUT URINARY OBSTRUCTION (ICD-600.10) - hx of nodular prostate w/ neg biopsies by  DrNesi in 1999...  ~ 9/11: he reports yearly f/u DrNesi due next week, ?on FLOMAX 0.4mg /d- we don't have records.   DEGENERATIVE JOINT DISEASE (ICD-715.90) - he uses OTC anti-inflamm meds Prn.  ~ s/p left rotator cuff repair 1995 by DrPresson...  ~ hx of distal left radius fracture in 2000 set by DrDuda...  ~ eval for back pain by DrBrooks 8/08... rec phys therapy & improved...     Review of Systems Constitutional:   No  weight loss, night sweats,  Fevers, chills, fatigue, or  lassitude.  HEENT:   No headaches,  Difficulty swallowing,  Tooth/dental problems, or  Sore throat,                No sneezing, itching, ear ache, nasal congestion, post nasal drip,   CV:  No chest pain,  Orthopnea, PND, swelling in lower extremities, anasarca, dizziness, palpitations, syncope.   GI  No heartburn, indigestion, abdominal pain, nausea, vomiting, diarrhea, change in bowel habits, loss of appetite, bloody stools.   Resp: No shortness of breath with exertion or at rest.  No excess mucus, no productive cough,  No non-productive cough,  No coughing up of blood.  No change in  color of mucus.  No wheezing.  No chest wall deformity  Skin: no rash or lesions.  GU: no dysuria, change in color of urine, no urgency or frequency.  No flank pain, no hematuria   MS:  No joint   swelling.  No decreased range of motion.  No back pain.  Psych:  No change in mood or affect. No depression or anxiety.  No memory loss.         Objective:   Physical Exam GEN: A/Ox3; [place] , NAD, thin male   HEENT:  [place]/AT,  EACs-clear, TMs-wnl, NOSE-clear, THROAT-clear, no lesions, no postnasal drip or exudate noted.   NECK:  Supple w/ fair ROM; no JVD; normal carotid impulses w/o bruits; no thyromegaly or nodules palpated; no lymphadenopathy.  RESP  Clear  P & A; w/o, wheezes/ rales/ or rhonchi.no accessory muscle use, no dullness to percussion  CARD:  RRR, no m/r/g  , no peripheral edema, pulses intact, no cyanosis or  clubbing.  GI:   Soft & nt; nml bowel sounds; no organomegaly or masses detected.  Musco: Warm bil, no deformities or joint swelling noted.   Neuro: alert, no focal deficits noted.    Skin: Warm, along right upper back/shoulder blade area is a small papule, no significant redness or streaking, no induration Along right lower waistline w/ small papule no sign redness or swelling.          Assessment & Plan:

## 2011-02-10 ENCOUNTER — Other Ambulatory Visit: Payer: Self-pay | Admitting: Pulmonary Disease

## 2011-03-07 ENCOUNTER — Other Ambulatory Visit: Payer: Self-pay | Admitting: Pulmonary Disease

## 2011-04-16 ENCOUNTER — Other Ambulatory Visit (INDEPENDENT_AMBULATORY_CARE_PROVIDER_SITE_OTHER): Payer: 59

## 2011-04-16 ENCOUNTER — Encounter: Payer: Self-pay | Admitting: Pulmonary Disease

## 2011-04-16 ENCOUNTER — Ambulatory Visit (INDEPENDENT_AMBULATORY_CARE_PROVIDER_SITE_OTHER): Payer: 59 | Admitting: Pulmonary Disease

## 2011-04-16 DIAGNOSIS — E119 Type 2 diabetes mellitus without complications: Secondary | ICD-10-CM

## 2011-04-16 DIAGNOSIS — N402 Nodular prostate without lower urinary tract symptoms: Secondary | ICD-10-CM

## 2011-04-16 DIAGNOSIS — K219 Gastro-esophageal reflux disease without esophagitis: Secondary | ICD-10-CM

## 2011-04-16 DIAGNOSIS — I10 Essential (primary) hypertension: Secondary | ICD-10-CM

## 2011-04-16 DIAGNOSIS — M1990 Unspecified osteoarthritis, unspecified site: Secondary | ICD-10-CM

## 2011-04-16 DIAGNOSIS — E785 Hyperlipidemia, unspecified: Secondary | ICD-10-CM

## 2011-04-16 MED ORDER — FLUOCINONIDE 0.05 % EX SOLN: CUTANEOUS | Status: DC

## 2011-04-16 NOTE — Patient Instructions (Signed)
Today we updated your med list in our EPIC system...    Continue your current medications the same...  Today we did your follow up A1c...    Please call the PHONE TREE in a few days for your results...    Dial [id number] & when prompted enter your patient number followed by the # symbol...    Your patient number is:  [id number]#  If your A1c is continuing to rise then we will add additional medication...    If it is improved we will continue the current Metformin & stress DIET & EXERCISE for better control...  Call for any questions...  Let's plan a follow up visit w/ fasting blood work next year.Marland KitchenMarland Kitchen

## 2011-04-16 NOTE — Progress Notes (Signed)
Subjective:    Patient ID: Dale Anderson, male    DOB: 1929/10/29, 77 y.o.   MRN: [id number]  HPI 77 y/o BM here for a follow up visit... he has multiple medical problems as noted below...   ~  March 01, 2010:  2 yr ROV- doing well he says w/o new complaints or concerns... mild arthritic symptoms but still works full time at 38 as [employer]- walking, stairs, etc... denies CP, palpit, SOB, cough, phlegm, GI or GU symptoms... he states that BP, Chol, BS, etc are all doing well... he will ret for fasting labs & CXR>   ~  April 16, 2011:  48mo ROV & he reports that he is doing well & feels good "all the time" just some stiffness in his joints; he continues to work full time security at [employer], very active & doing well;  He saw TP in July after tick exposure, had lab work done==> all looked good (see below) x A1c was 7.9 on his MetforminBid (no changes made other than rec for better low carb diet)...          Problem List:  Hx of BRONCHITIS, RECURRENT (ICD-491.9) - he is a never smoker... no recent bronchitic symptoms... ~  CXR 6/09 showed hyperaeration, clear, NAD- DJD in spine. ~  CXR 9/11> pt forgot to go to basement for film (too busy & had to get back to work).  HYPERTENSION (ICD-401.9) - on LISINOPRIL 10mg /d... BP= 116/68 and doing well... denies HA, fatigue, visual changes, CP, palipit, dizziness, syncope, dyspnea, edema, etc...  HYPERLIPIDEMIA (ICD-272.4) - on LIPITOR 20mg /d... tolerating well... ~  FLP 7/07 showed TChol 114, TG 47, HDL 41, LDL 64 ~  FLP 5/08 showed TChol 107, TG 39, HDL 37, LDL 63 ~  FLP 6/09 showed TChol 130, TG 46, HDL 52, LDL 69 ~  FLP 1/11 showed TChol 160, TG 141, HDL 40, LDL 92 ~  FLP 9/11 showed TChol 123, TG 66, HDL 37, LDL 73 ~  FLP 7/12 showed TChol 133, TG 89, HDL 38, LDL 77  DIABETES MELLITUS (ICD-250.00) - on METFORMIN 500mg Bid...  ~  labs 6/06 showed BS= 126, HgA1c= 6.1 ~  labs 5/08 showed BS= 121, HgA1c= 6.8 ~  labs 6/09  showed BS= 96, A1c= 6.3 ~  labs 1/11 showed BS= 111, A1c= 7.2 ~  labs 9/11 showed BS= 94, A1c= 7.5.Marland KitchenMarland Kitchen needs better diet, get wt down, or incr meds! ~  Labs 7/12 showed BS= 139, A1c= 7.9 ~  Labs 10/12 showed A1c= 7.2... Ok to continue Metform monotherapy but needs better diet.  GERD (ICD-530.81) - prev Prilosec OTC changed to NEXIUM 40mg /d ("it's free at hosp pharm").  Hx of HEMORRHOIDS (ICD-455.6) - last colonoscopy 6/98 by Dorris Singh was a normal study and f/u suggested in 10 yrs & he is overdue> refer to GI.  Hx of NODULAR PROSTATE WITHOUT URINARY OBSTRUCTION (ICD-600.10) - hx of nodular prostate w/ neg biopsies by DrNesi in 1999...  ~  9/11: he reports yearly f/u DrNesi due next week, ?on FLOMAX 0.4mg /d- but he only uses it Prn... ~  10/12:  He reports recent f/u DrNesi was "good" but we don't have records...  DEGENERATIVE JOINT DISEASE (ICD-715.90) - he uses OTC anti-inflamm meds Prn. ~  s/p left rotator cuff repair 1995 by DrPresson... ~  hx of distal left radius fracture in 2000 set by DrDuda... ~  eval for back pain by DrBrooks 8/08... rec phys therapy & improved...  ANXIETY (  ICD-300.00)  SHINGLES, HX OF (ICD-V13.8) - hx of right L1 shingles in 2004... also hx of seb dermatitis eval at [facility] in 2002...  HEALTH MAINTENANCE: ~  GI:  Followed by Dorris Singh & he is overdue for f/u colonoscopy... ~  GU:  Followed by [facility] w/ PSAs but we don't have any recent notes... ~  Immuniz:  He gets the seasonal flu vaccine yearly at the [facility]; Employee health should be keeping up w/ his vaccinations, he says...   Past Surgical History  Procedure Date  . Rotator cuff repair 1995    Left.  Dr. Meade Maw  . Inguinal hernia repair 2006    Right.  Dr. Lurene Shadow    Outpatient Encounter Prescriptions as of 04/16/2011  Medication Sig Dispense Refill  . ASPIRIN LOW DOSE 81 MG EC tablet TAKE 1 TABLET BY MOUTH EVERY DAY  90 tablet  3  . atorvastatin (LIPITOR) 20 MG tablet Take 20 mg by mouth daily.          . Cholecalciferol (VITAMIN D) 1000 UNITS capsule Take 1,000 Units by mouth daily.        Marland Kitchen esomeprazole (NEXIUM) 40 MG capsule Take 40 mg by mouth daily before breakfast.        . fluocinonide (LIDEX) 0.05 % external solution APPLY TO THE AFFECTED AREA AS DIRECTED  60 mL  0  . lisinopril (PRINIVIL,ZESTRIL) 10 MG tablet Take 10 mg by mouth daily.        . metFORMIN (GLUCOPHAGE) 500 MG tablet Take 500 mg by mouth 2 (two) times daily.        . Multiple Vitamin (MULTIVITAMIN) capsule Take 1 capsule by mouth daily.        . Tamsulosin HCl (FLOMAX) 0.4 MG CAPS Take 0.4 mg by mouth daily.        Gilman Schmidt TEST test strip TEST BLOOD SUGAR ONCE DAILY  1 each  3    No Known Allergies   Current Medications, Allergies, Past Medical History, Past Surgical History, Family History, and Social History were reviewed in [facility] record.    Review of Systems        The patient complains of nocturia and arthritis.  The patient denies fever, chills, sweats, anorexia, fatigue, weakness, malaise, weight loss, sleep disorder, blurring, diplopia, eye irritation, eye discharge, vision loss, eye pain, photophobia, earache, ear discharge, tinnitus, decreased hearing, nasal congestion, nosebleeds, sore throat, hoarseness, chest pain, palpitations, syncope, dyspnea on exertion, orthopnea, PND, peripheral edema, cough, dyspnea at rest, excessive sputum, hemoptysis, wheezing, pleurisy, nausea, vomiting, diarrhea, constipation, change in bowel habits, abdominal pain, melena, hematochezia, jaundice, gas/bloating, indigestion/heartburn, dysphagia, odynophagia, dysuria, hematuria, urinary frequency, urinary hesitancy, incontinence, back pain, joint pain, joint swelling, muscle cramps, muscle weakness, stiffness, sciatica, restless legs, leg pain at night, leg pain with exertion, rash, itching, dryness, suspicious lesions, paralysis, paresthesias, seizures, tremors, vertigo, transient blindness, frequent  falls, frequent headaches, difficulty walking, depression, anxiety, memory loss, confusion, cold intolerance, heat intolerance, polydipsia, polyphagia, polyuria, unusual weight change, abnormal bruising, bleeding, enlarged lymph nodes, urticaria, allergic rash, hay fever, and recurrent infections.     Objective:   Physical Exam     WD, WN, 77 y/o BM in NAD... GENERAL:  Alert & oriented; Demeo & cooperative... HEENT:  [place]/AT, EOM-wnl, PERRLA, EACs-clear, TMs-wnl, NOSE-clear, THROAT-clear & wnl. NECK:  Supple w/ fairROM; no JVD; normal carotid impulses w/o bruits; no thyromegaly or nodules palpated; no lymphadenopathy. CHEST:  Clear to P & A; without wheezes/ rales/ or rhonchi.  HEART:  Regular Rhythm; without murmurs/ rubs/ or gallops. ABDOMEN:  Soft & nontender; normal bowel sounds; no organomegaly or masses detected.  EXT: without deformities, mild arthritic changes; no varicose veins/ venous insuffic/ or edema. NEURO:  CN's intact; no focal neuro deficits... DERM:  No lesions noted; no rash etc...   Assessment & Plan:   HBP>  Controlled on Lisinopril + diet;  Continue same...  HYPERLIPID>  FLP looks good on Lip20 + diet;  Continue same...  DM>  On Metformin monotherapy + diet; his A1c is improved to 7.2 & we can hold off on new meds for now; admonished to stay on diet + exercise, etc...  GERD>  Stable on PPI Rx  Hx Hems>  He is actually overdue for his routine screening colonoscopy; we will send referral to DrKaplan to review...  GU> nodular prostate, neg bx, BPH>  Followed by [facility] & we will call for his notes...  DJD>  Aware; he uses OTC analgesics, going satis...  Other medical problems as noted.Marland KitchenMarland KitchenMarland Kitchen

## 2011-04-26 ENCOUNTER — Encounter: Payer: Self-pay | Admitting: Pulmonary Disease

## 2011-04-28 ENCOUNTER — Other Ambulatory Visit: Payer: Self-pay | Admitting: Pulmonary Disease

## 2011-04-28 DIAGNOSIS — K649 Unspecified hemorrhoids: Secondary | ICD-10-CM

## 2011-04-28 DIAGNOSIS — K219 Gastro-esophageal reflux disease without esophagitis: Secondary | ICD-10-CM

## 2011-05-07 ENCOUNTER — Telehealth: Payer: Self-pay | Admitting: Pulmonary Disease

## 2011-05-07 MED ORDER — ATORVASTATIN CALCIUM 20 MG PO TABS: 20.0000 mg | ORAL_TABLET | Freq: Every day | ORAL | Status: DC

## 2011-05-07 MED ORDER — ASPIRIN 81 MG PO TBEC: 81.0000 mg | DELAYED_RELEASE_TABLET | Freq: Every day | ORAL | Status: DC

## 2011-05-07 MED ORDER — LISINOPRIL 10 MG PO TABS: 10.0000 mg | ORAL_TABLET | Freq: Every day | ORAL | Status: DC

## 2011-05-07 MED ORDER — METFORMIN HCL 500 MG PO TABS: 500.0000 mg | ORAL_TABLET | Freq: Two times a day (BID) | ORAL | Status: DC

## 2011-05-07 NOTE — Telephone Encounter (Signed)
Left message that Rx's have been sent to pharmacy-if any questions or concerns please call the office.

## 2011-05-08 ENCOUNTER — Other Ambulatory Visit: Payer: Self-pay | Admitting: *Deleted

## 2011-05-08 ENCOUNTER — Telehealth: Payer: Self-pay | Admitting: Pulmonary Disease

## 2011-05-08 MED ORDER — ESOMEPRAZOLE MAGNESIUM 40 MG PO CPDR: 40.0000 mg | DELAYED_RELEASE_CAPSULE | Freq: Every day | ORAL | Status: DC

## 2011-05-08 MED ORDER — METFORMIN HCL 500 MG PO TABS: 500.0000 mg | ORAL_TABLET | Freq: Two times a day (BID) | ORAL | Status: DC

## 2011-05-08 MED ORDER — LISINOPRIL 10 MG PO TABS: 10.0000 mg | ORAL_TABLET | Freq: Every day | ORAL | Status: DC

## 2011-05-08 NOTE — Telephone Encounter (Signed)
Called and spoke with pt.  Informed him rx sent to pharmacy.

## 2011-05-26 ENCOUNTER — Ambulatory Visit (AMBULATORY_SURGERY_CENTER): Payer: 59 | Admitting: *Deleted

## 2011-05-26 VITALS — Ht 71.0 in | Wt 166.3 lb

## 2011-05-26 DIAGNOSIS — Z1211 Encounter for screening for malignant neoplasm of colon: Secondary | ICD-10-CM

## 2011-05-26 MED ORDER — PEG-KCL-NACL-NASULF-NA ASC-C 100 G PO SOLR: ORAL | Status: DC

## 2011-06-09 ENCOUNTER — Ambulatory Visit (AMBULATORY_SURGERY_CENTER): Payer: 59 | Admitting: Gastroenterology

## 2011-06-09 ENCOUNTER — Encounter: Payer: Self-pay | Admitting: Gastroenterology

## 2011-06-09 VITALS — BP 141/80 | HR 67 | Temp 97.9°F | Resp 20 | Ht 71.0 in | Wt 166.0 lb

## 2011-06-09 DIAGNOSIS — D126 Benign neoplasm of colon, unspecified: Secondary | ICD-10-CM

## 2011-06-09 DIAGNOSIS — Z1211 Encounter for screening for malignant neoplasm of colon: Secondary | ICD-10-CM

## 2011-06-09 LAB — HM COLONOSCOPY: HM Colonoscopy: 2

## 2011-06-09 LAB — GLUCOSE, CAPILLARY
Glucose-Capillary: 91 mg/dL (ref 70–99)
Glucose-Capillary: 99 mg/dL (ref 70–99)

## 2011-06-09 MED ORDER — SODIUM CHLORIDE 0.9 % IV SOLN: 500.0000 mL | INTRAVENOUS | Status: DC

## 2011-06-09 NOTE — Progress Notes (Signed)
Patient did not experience any of the following events: a burn prior to discharge; a fall within the facility; wrong site/side/patient/procedure/implant event; or a hospital transfer or hospital admission upon discharge from the facility. (G8907) Patient did not have preoperative order for IV antibiotic SSI prophylaxis. (G8918)  

## 2011-06-09 NOTE — Op Note (Signed)
[facility] [address] [place], [place]  [address]  COLONOSCOPY PROCEDURE REPORT  PATIENT:  Dale, Anderson  MR#:  [id number] BIRTHDATE:  10/03/1929, 81 yrs. old  GENDER:  male ENDOSCOPIST:  Barbette Hair. Arlyce Dice, MD REF. BY: PROCEDURE DATE:  06/09/2011 PROCEDURE:  Colon with cold biopsy polypectomy ASA CLASS:  Class II INDICATIONS:  Routine Risk Screening MEDICATIONS:   These medications were titrated to patient response per physician's verbal order, Fentanyl 50 mcg IV, Versed 5 mg IV  DESCRIPTION OF PROCEDURE:   After the risks benefits and alternatives of the procedure were thoroughly explained, informed consent was obtained.  Digital rectal exam was performed and revealed no abnormalities.   The LB 180AL [id number] endoscope was introduced through the anus and advanced to the cecum, which was identified by both the appendix and ileocecal valve, without limitations.  The quality of the prep was excellent, using MoviPrep.  The instrument was then slowly withdrawn as the colon was fully examined. <<PROCEDUREIMAGES>>  FINDINGS:  A diminutive polyp was found in the sigmoid colon. It was 1 - 2 mm in size. It was found 18 cm from the point of entry. The polyp was removed using cold biopsy forceps.  Mild diverticulosis was found in the sigmoid colon (see image1).  This was otherwise a normal examination of the colon (see image3 and image4).   Retroflexed views in the rectum revealed no abnormalities.    The time to cecum =  1) 3.75  minutes. The scope was then withdrawn in  1) 7.0  minutes from the cecum and the procedure completed. COMPLICATIONS:  None ENDOSCOPIC IMPRESSION: 1) 1 - 2 mm diminutive polyp in the sigmoid colon 2) Mild diverticulosis in the sigmoid colon 3) Otherwise normal examination RECOMMENDATIONS: 1) If the polyp(s) removed today are proven to be adenomatous (pre-cancerous) polyps, you will need a repeat colonoscopy in 5 years. Otherwise you should  continue to follow colorectal cancer screening guidelines for "routine risk" patients with colonoscopy in 10 years. REPEAT EXAM:   You will receive a letter from Dr. Arlyce Dice in 1-2 weeks, after reviewing the final pathology, with followup recommendations.  ______________________________ Barbette Hair Arlyce Dice, MD  CC:  Michele Mcalpine, MD  n. Rosalie Doctor:   Barbette Hair. Jandel Patriarca at 06/09/2011 11:05 AM  Klipfel, Janann August, [phone]

## 2011-06-09 NOTE — Patient Instructions (Signed)
Please refer to blue and green discharge instruction sheets. 

## 2011-06-10 ENCOUNTER — Telehealth: Payer: Self-pay | Admitting: *Deleted

## 2011-06-10 NOTE — Telephone Encounter (Signed)

## 2011-06-13 ENCOUNTER — Encounter: Payer: Self-pay | Admitting: Internal Medicine

## 2011-06-20 ENCOUNTER — Encounter (HOSPITAL_COMMUNITY): Payer: Self-pay

## 2011-06-20 ENCOUNTER — Emergency Department (INDEPENDENT_AMBULATORY_CARE_PROVIDER_SITE_OTHER)
Admission: EM | Admit: 2011-06-20 | Discharge: 2011-06-20 | Disposition: A | Discharge status: Home / Self Care | Payer: 59 | Source: Home / Self Care | Attending: Emergency Medicine | Admitting: Emergency Medicine

## 2011-06-20 DIAGNOSIS — S46001A Unspecified injury of muscle(s) and tendon(s) of the rotator cuff of right shoulder, initial encounter: Secondary | ICD-10-CM

## 2011-06-20 DIAGNOSIS — S4980XA Other specified injuries of shoulder and upper arm, unspecified arm, initial encounter: Secondary | ICD-10-CM

## 2011-06-20 MED ORDER — TRAMADOL HCL 50 MG PO TABS: 50.0000 mg | ORAL_TABLET | Freq: Every evening | ORAL | Status: AC | PRN

## 2011-06-20 NOTE — ED Provider Notes (Signed)
History     CSN: [id number]  Arrival date & time 06/20/11  1235   First MD Initiated Contact with Patient 06/20/11 1247      Chief Complaint  Patient presents with  . Shoulder Pain    (Consider location/radiation/quality/duration/timing/severity/associated sxs/prior treatment) HPI Comments: Pt states he was lifting some cases of water 3 days ago stacking them. He lifted the last one and as he placed it on top of the others he suddenly had pain in his Rt shoulder. Since then the pain has persisted and he has weakness lifting his arm. He has to use his Lt arm to help raise his Rt arm over his head. No numbness or tingling. He states the pain is tolerable during the day but is worse at night and disruptive to his sleep. He is not taking anything for pain.    Past Medical History  Diagnosis Date  . Recurrent bronchiectasis   . Hypertension   . Hyperlipidemia   . Diabetes mellitus   . GERD (gastroesophageal reflux disease)   . Hemorrhoid   . Nodular prostate without urinary obstruction   . DJD (degenerative joint disease)   . Anxiety   . History of shingles     Past Surgical History  Procedure Date  . Rotator cuff repair 1995    Left.  Dr. Meade Maw  . Inguinal hernia repair 2006    Right.  Dr. Lurene Shadow  . Colonoscopy 1998  . Hand reconstruction     right    Family History  Problem Relation Age of Onset  . Emphysema Other     cousin  . Heart disease Father     MI at age 56  . Colon cancer Neg Hx     History  Substance Use Topics  . Smoking status: Never Smoker   . Smokeless tobacco: Never Used  . Alcohol Use: No      Review of Systems  Musculoskeletal: Negative for myalgias, back pain and joint swelling.  Neurological: Positive for weakness. Negative for numbness.    Allergies  Review of patient's allergies indicates no known allergies.  Home Medications   Current Outpatient Rx  Name Route Sig Dispense Refill  . ASPIRIN 81 MG PO TBEC Oral Take 1 tablet  (81 mg total) by mouth daily. Swallow whole. 90 tablet 3  . ATORVASTATIN CALCIUM 20 MG PO TABS Oral Take 1 tablet (20 mg total) by mouth daily. 90 tablet 3  . VITAMIN D 1000 UNITS PO CAPS Oral Take 1,000 Units by mouth daily.      Marland Kitchen ESOMEPRAZOLE MAGNESIUM 40 MG PO CPDR Oral Take 1 capsule (40 mg total) by mouth daily before breakfast. 90 capsule 3  . FLUOCINONIDE 0.05 % EX SOLN  Apply to the affected area as directed 80 mL 11  . LISINOPRIL 10 MG PO TABS Oral Take 1 tablet (10 mg total) by mouth daily. 90 tablet 3  . METFORMIN HCL 500 MG PO TABS Oral Take 1 tablet (500 mg total) by mouth 2 (two) times daily. 180 tablet 3  . MULTIVITAMINS PO CAPS Oral Take 1 capsule by mouth daily.      Marland Kitchen TAMSULOSIN HCL 0.4 MG PO CAPS Oral Take 0.4 mg by mouth daily.      Gilman Schmidt TEST VI STRP  TEST BLOOD SUGAR ONCE DAILY 1 each 3  . ZIRGAN 0.15 % OP GEL Apply externally Apply 1 application topically Daily.    . TRAMADOL HCL 50 MG PO TABS Oral Take 1  tablet (50 mg total) by mouth at bedtime as needed for pain. Maximum dose= 8 tablets per day 10 tablet 0    BP 121/87  Pulse 95  Temp(Src) 97.9 F (36.6 C) (Oral)  Resp 18  SpO2 100%  Physical Exam  Nursing note and vitals reviewed. Constitutional: He appears well-developed and well-nourished. No distress.  Cardiovascular: Normal rate, regular rhythm and normal heart sounds.   Pulses:      Radial pulses are 2+ on the right side, and 2+ on the left side.  Pulmonary/Chest: Effort normal and breath sounds normal. No respiratory distress.  Musculoskeletal:       Right shoulder: He exhibits decreased range of motion (Pt able to abduct Rt shoulder to approx 70 degrees actively.  Full passive ROM), tenderness (TTP anterior joint line.) and decreased strength. He exhibits no swelling, no effusion, no crepitus, no deformity, no laceration, no spasm and normal pulse.  Neurological: He is alert. No sensory deficit.  Reflex Scores:      Bicep reflexes are 2+ on the  right side and 2+ on the left side. Skin: Skin is warm and dry. No rash noted. No erythema.  Psychiatric: He has a normal mood and affect.    ED Course  Procedures (including critical care time)  Labs Reviewed - No data to display No results found.   1. Injury of tendon of right rotator cuff       MDM   Probable Rt rotator cuff injury. Pt referred to ortho for follow up.        Melody Comas, [place] 06/20/11 [phone]

## 2011-06-20 NOTE — ED Notes (Signed)
Pt states he was lifting cases of water 3 weeks ago and felt pain in the rt shoulder.  Rt shoulder continues to be painful and he has limited ability to raise rt arm.

## 2011-06-24 NOTE — ED Provider Notes (Signed)
Medical screening examination/treatment/procedure(s) were performed by non-physician practitioner and as supervising physician I was immediately available for consultation/collaboration.   KINDL,JAMES DOUGLAS MD.    James Douglas Kindl, MD 06/24/11 1401 

## 2011-07-07 ENCOUNTER — Other Ambulatory Visit (HOSPITAL_COMMUNITY): Payer: Self-pay | Admitting: Orthopedic Surgery

## 2011-07-07 DIAGNOSIS — M25511 Pain in right shoulder: Secondary | ICD-10-CM

## 2011-07-14 ENCOUNTER — Ambulatory Visit (HOSPITAL_COMMUNITY)
Admission: RE | Admit: 2011-07-14 | Discharge: 2011-07-14 | Disposition: A | Discharge status: Home / Self Care | Payer: 59 | Source: Ambulatory Visit | Attending: Orthopedic Surgery | Admitting: Orthopedic Surgery

## 2011-07-14 DIAGNOSIS — M25511 Pain in right shoulder: Secondary | ICD-10-CM

## 2011-07-14 DIAGNOSIS — X58XXXA Exposure to other specified factors, initial encounter: Secondary | ICD-10-CM | POA: Insufficient documentation

## 2011-07-14 DIAGNOSIS — S43429A Sprain of unspecified rotator cuff capsule, initial encounter: Secondary | ICD-10-CM | POA: Insufficient documentation

## 2011-11-12 ENCOUNTER — Other Ambulatory Visit: Payer: Self-pay | Admitting: Pulmonary Disease

## 2012-03-10 ENCOUNTER — Other Ambulatory Visit (INDEPENDENT_AMBULATORY_CARE_PROVIDER_SITE_OTHER): Payer: 59

## 2012-03-10 ENCOUNTER — Other Ambulatory Visit: Payer: Self-pay | Admitting: Pulmonary Disease

## 2012-03-10 DIAGNOSIS — N402 Nodular prostate without lower urinary tract symptoms: Secondary | ICD-10-CM

## 2012-03-10 DIAGNOSIS — E785 Hyperlipidemia, unspecified: Secondary | ICD-10-CM

## 2012-03-10 DIAGNOSIS — I10 Essential (primary) hypertension: Secondary | ICD-10-CM

## 2012-03-10 DIAGNOSIS — F411 Generalized anxiety disorder: Secondary | ICD-10-CM

## 2012-03-10 DIAGNOSIS — E119 Type 2 diabetes mellitus without complications: Secondary | ICD-10-CM

## 2012-03-10 LAB — CBC WITH DIFFERENTIAL/PLATELET
Basophils Absolute: 0 10*3/uL (ref 0.0–0.1)
HCT: 35.4 % — ABNORMAL LOW (ref 39.0–52.0)
Lymphs Abs: 1.4 10*3/uL (ref 0.7–4.0)
Monocytes Absolute: 0.5 10*3/uL (ref 0.1–1.0)
Monocytes Relative: 10.2 % (ref 3.0–12.0)
Platelets: 185 10*3/uL (ref 150.0–400.0)
RDW: 13.8 % (ref 11.5–14.6)

## 2012-03-10 LAB — HEPATIC FUNCTION PANEL
AST: 21 U/L (ref 0–37)
Albumin: 3.9 g/dL (ref 3.5–5.2)
Alkaline Phosphatase: 54 U/L (ref 39–117)
Total Protein: 6.8 g/dL (ref 6.0–8.3)

## 2012-03-10 LAB — BASIC METABOLIC PANEL
CO2: 28 mEq/L (ref 19–32)
Chloride: 106 mEq/L (ref 96–112)
GFR: 89.4 mL/min (ref >60.00)
Glucose, Bld: 110 mg/dL — ABNORMAL HIGH (ref 70–99)
Potassium: 4.2 mEq/L (ref 3.5–5.1)
Sodium: 141 mEq/L (ref 135–145)

## 2012-03-10 LAB — TSH: TSH: 1.71 u[IU]/mL (ref 0.35–5.50)

## 2012-04-12 ENCOUNTER — Encounter: Payer: Self-pay | Admitting: *Deleted

## 2012-04-13 ENCOUNTER — Encounter: Payer: Self-pay | Admitting: Pulmonary Disease

## 2012-04-13 ENCOUNTER — Ambulatory Visit (INDEPENDENT_AMBULATORY_CARE_PROVIDER_SITE_OTHER): Payer: 59 | Admitting: Pulmonary Disease

## 2012-04-13 VITALS — BP 132/62 | HR 85 | Temp 96.9°F | Ht 71.0 in | Wt 165.5 lb

## 2012-04-13 DIAGNOSIS — M1990 Unspecified osteoarthritis, unspecified site: Secondary | ICD-10-CM

## 2012-04-13 DIAGNOSIS — E119 Type 2 diabetes mellitus without complications: Secondary | ICD-10-CM

## 2012-04-13 DIAGNOSIS — K219 Gastro-esophageal reflux disease without esophagitis: Secondary | ICD-10-CM

## 2012-04-13 DIAGNOSIS — N402 Nodular prostate without lower urinary tract symptoms: Secondary | ICD-10-CM

## 2012-04-13 DIAGNOSIS — E785 Hyperlipidemia, unspecified: Secondary | ICD-10-CM

## 2012-04-13 DIAGNOSIS — F411 Generalized anxiety disorder: Secondary | ICD-10-CM

## 2012-04-13 DIAGNOSIS — K649 Unspecified hemorrhoids: Secondary | ICD-10-CM

## 2012-04-13 DIAGNOSIS — I10 Essential (primary) hypertension: Secondary | ICD-10-CM

## 2012-04-13 MED ORDER — ALPRAZOLAM 0.5 MG PO TABS: ORAL_TABLET | ORAL | Status: DC

## 2012-04-13 NOTE — Progress Notes (Signed)
Subjective:    Patient ID: Dale Anderson, male    DOB: Oct 19, 1929, 78 y.o.   MRN: [id number]  HPI 78 y/o BM here for a follow up visit... he has multiple medical problems as noted below...   ~  March 01, 2010:  2 yr ROV- doing well he says w/o new complaints or concerns... mild arthritic symptoms but still works full time at 43 [employer]- walking, stairs, etc... denies CP, palpit, SOB, cough, phlegm, GI or GU symptoms... he states that BP, Chol, BS, etc are all doing well... 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  ~  April 13, 2012:  Yearly ROV & Dale Anderson tells me that DrWainer is planning rotator cuff surg 12/13... His CC is anxiety 7 jitteriness that keeps him from sleeping, requests Rx for Xanax to take at night for sleep- OK...     HxBronchitis> no recent exac & he does not require regular meds...    HBP> on Lisin10mg /d w/ good control & BP= 132/62 today; he denies HA, CP, palpit, SOB, edema, etc; still works full time security at [employer] no prob w/ walking, stairs, etc...    Others> he remains on Lip20& his FLP 9/13 showed TChol 128, TG 40, HDL 44, LDL 76;  Still on DM diet + Metform500Bid w/ BS=110, A1c=7.0;  He remains on Nexium for the GERD & Flomax for his Prostate (see below)...    We reviewed prob list, meds, xrays and labs> see below for updates >> he had the Flu shot recently & requests Rx for Shingles vaccine.          Problem List:  Hx of BRONCHITIS, RECURRENT (ICD-491.9) - he is a never smoker... no recent bronchitic symptoms... ~  CXR 6/09 showed hyperaeration,  clear, NAD- DJD in spine. ~  CXR 9/11> pt forgot to go to basement for film (too busy & had to get back to work).  HYPERTENSION (ICD-401.9) - on LISINOPRIL 10mg /d...  ~  10/12:  BP= 116/68 and doing well... denies HA, fatigue, visual changes, CP, palipit, dizziness, syncope, dyspnea, edema, etc... ~  10/13:  BP= 1342/62 & he remains largely asymptomatic...  HYPERLIPIDEMIA (ICD-272.4) - on LIPITOR 20mg /d... tolerating well... ~  FLP 7/07 showed TChol 114, TG 47, HDL 41, LDL 64 ~  FLP 5/08 showed TChol 107, TG 39, HDL 37, LDL 63 ~  FLP 6/09 showed TChol 130, TG 46, HDL 52, LDL 69 ~  FLP 1/11 showed TChol 160, TG 141, HDL 40, LDL 92 ~  FLP 9/11 showed TChol 123, TG 66, HDL 37, LDL 73 ~  FLP 7/12 showed TChol 133, TG 89, HDL 38, LDL 77 ~  FLP 9/13 on Lip20 showed TChol 128, TG 40, HDL 44, LDL 76   DIABETES MELLITUS (ICD-250.00) - on METFORMIN 500mg Bid...  ~  labs 6/06 showed BS= 126, HgA1c= 6.1 ~  labs 5/08 showed BS= 121, HgA1c= 6.8 ~  labs 6/09 showed BS= 96, A1c= 6.3 ~  labs 1/11 showed BS= 111,  A1c= 7.2 ~  labs 9/11 showed BS= 94, A1c= 7.5.Marland KitchenMarland Kitchen needs better diet, get wt down, or incr meds! ~  Labs 7/12 showed BS= 139, A1c= 7.9 ~  Labs 10/12 showed A1c= 7.2... Ok to continue Metform monotherapy but needs better diet. ~  Labs 9/13 on Metform500Bid showed BS=110, A1c=7.0  GERD (ICD-530.81) - prev Prilosec OTC changed to NEXIUM 40mg /d ("it's free at hosp pharm").  Hx of HEMORRHOIDS (ICD-455.6) - last colonoscopy 6/98 by Dorris Singh was a normal study and f/u suggested in 10 yrs & he is overdue> refer to GI. ~  12/12: he had f/u colonoscopy by DrKaplan w/ mild divertics & 1-4mm diminutive polyp in sigmoid= hyperplastic...  Hx of NODULAR PROSTATE WITHOUT URINARY OBSTRUCTION (ICD-600.10) - hx of nodular prostate w/ neg biopsies by DrNesi in 1999...  ~  9/11: he reports yearly f/u DrNesi due next week, ?on FLOMAX 0.4mg /d- but he only uses it Prn... ~  10/12:  He reports recent f/u DrNesi was  "good" but we don't have records... ~  10/13:  He tells me recent check up by [facility] was good but we still don't have their records...  DEGENERATIVE JOINT DISEASE (ICD-715.90) - he uses OTC anti-inflamm meds Prn. ~  s/p left rotator cuff repair 1995 by DrPresson... ~  hx of distal left radius fracture in 2000 set by DrDuda... ~  eval for back pain by DrBrooks 8/08... rec phys therapy & improved... ~  Right rotator cuff tear & DrWainer plans repair 12/13...  ANXIETY (ICD-300.00) - he requests ALPRAZOLAM 0.5mg - 1/2 to 1 tab Tid as needed...  SHINGLES, HX OF (ICD-V13.8) - hx of right L1 shingles in 2004... also hx of seb dermatitis eval at [facility] in 2002... Rx written for vaccine at local pharm...  HEALTH MAINTENANCE: ~  GI:  Followed by Dorris Singh & he is overdue for f/u colonoscopy... ~  GU:  Followed by [facility] w/ PSAs but we don't have any recent notes... ~  Immuniz:  He gets the seasonal flu vaccine yearly at the [facility]; Employee health should be keeping up w/ his vaccinations, he says...   Past Surgical History  Procedure Date  . Rotator cuff repair 1995    Left.  Dr. Meade Maw  . Inguinal hernia repair 2006    Right.  Dr. Lurene Shadow  . Colonoscopy 1998  . Hand reconstruction     right    Outpatient Encounter Prescriptions as of 04/13/2012  Medication Sig Dispense Refill  . ASPIRIN LOW DOSE 81 MG EC tablet TAKE 1 TABLET BY MOUTH EVERY DAY  90 tablet  3  . atorvastatin (LIPITOR) 20 MG tablet Take 1 tablet (20 mg total) by mouth daily.  90 tablet  3  . Cholecalciferol (VITAMIN D) 1000 UNITS capsule Take 1,000 Units by mouth daily.        Marland Kitchen esomeprazole (NEXIUM) 40 MG capsule Take 1 capsule (40 mg total) by mouth daily before breakfast.  90 capsule  3  . fluocinonide (LIDEX) 0.05 % external solution Apply to the affected area as directed  80 mL  11  . lisinopril (PRINIVIL,ZESTRIL) 10 MG tablet Take 1 tablet (10 mg total) by mouth daily.  90 tablet  3  . metFORMIN (GLUCOPHAGE) 500 MG tablet  Take 1 tablet (500 mg total) by mouth 2 (two) times daily.  180 tablet  3  . Multiple Vitamin (MULTIVITAMIN) capsule Take 1 capsule by mouth daily.        . Tamsulosin HCl (FLOMAX) 0.4 MG CAPS Take 0.4  mg by mouth daily.        Gilman Schmidt TEST test strip TEST BLOOD SUGAR ONCE DAILY  1 each  3  . ZIRGAN 0.15 % GEL Apply 1 application topically Daily.      Marland Kitchen ALPRAZolam (XANAX) 0.5 MG tablet Take 1/2 to 1 tablet by mouth three times daily as needed for nerves  90 tablet  5   Facility-Administered Encounter Medications as of 04/13/2012  Medication Dose Route Frequency Provider Last Rate Last Dose  . 0.9 %  sodium chloride infusion  500 mL Intravenous Continuous Louis Meckel, MD        No Known Allergies   Current Medications, Allergies, Past Medical History, Past Surgical History, Family History, and Social History were reviewed in [employer] record.    Review of Systems        The patient complains of nocturia and arthritis.  The patient denies fever, chills, sweats, anorexia, fatigue, weakness, malaise, weight loss, sleep disorder, blurring, diplopia, eye irritation, eye discharge, vision loss, eye pain, photophobia, earache, ear discharge, tinnitus, decreased hearing, nasal congestion, nosebleeds, sore throat, hoarseness, chest pain, palpitations, syncope, dyspnea on exertion, orthopnea, PND, peripheral edema, cough, dyspnea at rest, excessive sputum, hemoptysis, wheezing, pleurisy, nausea, vomiting, diarrhea, constipation, change in bowel habits, abdominal pain, melena, hematochezia, jaundice, gas/bloating, indigestion/heartburn, dysphagia, odynophagia, dysuria, hematuria, urinary frequency, urinary hesitancy, incontinence, back pain, joint pain, joint swelling, muscle cramps, muscle weakness, stiffness, sciatica, restless legs, leg pain at night, leg pain with exertion, rash, itching, dryness, suspicious lesions, paralysis, paresthesias, seizures, tremors, vertigo,  transient blindness, frequent falls, frequent headaches, difficulty walking, depression, anxiety, memory loss, confusion, cold intolerance, heat intolerance, polydipsia, polyphagia, polyuria, unusual weight change, abnormal bruising, bleeding, enlarged lymph nodes, urticaria, allergic rash, hay fever, and recurrent infections.     Objective:   Physical Exam     WD, WN, 78 y/o BM in NAD... GENERAL:  Alert & oriented; [place] & cooperative... HEENT:  [place]/AT, EOM-wnl, PERRLA, EACs-clear, TMs-wnl, NOSE-clear, THROAT-clear & wnl. NECK:  Supple w/ fairROM; no JVD; normal carotid impulses w/o bruits; no thyromegaly or nodules palpated; no lymphadenopathy. CHEST:  Clear to P & A; without wheezes/ rales/ or rhonchi. HEART:  Regular Rhythm; without murmurs/ rubs/ or gallops. ABDOMEN:  Soft & nontender; normal bowel sounds; no organomegaly or masses detected. EXT: without deformities, mild arthritic changes; decr ROM shoulders; no varicose veins/ venous insuffic/ or edema. NEURO:  CN's intact; no focal neuro deficits... DERM:  No lesions noted; no rash etc...  RADIOLOGY DATA:  Reviewed in the EPIC EMR & discussed w/ the patient...  LABORATORY DATA:  Reviewed in the EPIC EMR & discussed w/ the patient...   Assessment & Plan:    Rotator cuff tear>  OK for surg...  HBP>  Controlled on Lisinopril + diet;  Continue same...  HYPERLIPID>  FLP looks good on Lip20 + diet;  Continue same...  DM>  On Metformin monotherapy + diet; his A1c is improved to 7.0 & we can hold off on new meds for now; admonished to stay on diet + exercise, etc...  GERD>  Stable on PPI Rx  Hx Hems, Divertics, Polyp>  DrKaplan did f/u colon 12/12 w/ divertics & diminutive hyperplastic polyp removed...  GU> nodular prostate, neg bx, BPH>  Followed by [facility] & pt reports all neg- doing well...  DJD>  Aware; he uses OTC analgesics, going satis...  Other medical problems as noted....   Patient's Medications  New  Prescriptions  ALPRAZOLAM (XANAX) 0.5 MG TABLET    Take 1/2 to 1 tablet by mouth three times daily as needed for nerves  Previous Medications   ASPIRIN LOW DOSE 81 MG EC TABLET    TAKE 1 TABLET BY MOUTH EVERY DAY   ATORVASTATIN (LIPITOR) 20 MG TABLET    Take 1 tablet (20 mg total) by mouth daily.   CHOLECALCIFEROL (VITAMIN D) 1000 UNITS CAPSULE    Take 1,000 Units by mouth daily.     ESOMEPRAZOLE (NEXIUM) 40 MG CAPSULE    Take 1 capsule (40 mg total) by mouth daily before breakfast.   FLUOCINONIDE (LIDEX) 0.05 % EXTERNAL SOLUTION    Apply to the affected area as directed   LISINOPRIL (PRINIVIL,ZESTRIL) 10 MG TABLET    Take 1 tablet (10 mg total) by mouth daily.   METFORMIN (GLUCOPHAGE) 500 MG TABLET    Take 1 tablet (500 mg total) by mouth 2 (two) times daily.   MULTIPLE VITAMIN (MULTIVITAMIN) CAPSULE    Take 1 capsule by mouth daily.     TAMSULOSIN HCL (FLOMAX) 0.4 MG CAPS    Take 0.4 mg by mouth daily.     TRUETRACK TEST TEST STRIP    TEST BLOOD SUGAR ONCE DAILY   ZIRGAN 0.15 % GEL    Apply 1 application topically Daily.  Modified Medications   No medications on file  Discontinued Medications   No medications on file

## 2012-04-13 NOTE — Patient Instructions (Addendum)
Today we updated your med list in our EPIC system...    Continue your current medications the same...    We wrote a new prescription for Alprazolam 0.5mg - take 1/2 to 1 tab as needed for nerves or sleep...  Good luck w/ the rotator cuff surg...  Call for any questions..  Let's continue our yearly check ups, but call sooner anytime if needed for problems.Marland KitchenMarland Kitchen

## 2012-05-19 ENCOUNTER — Other Ambulatory Visit: Payer: Self-pay | Admitting: Pulmonary Disease

## 2012-05-19 ENCOUNTER — Encounter (HOSPITAL_BASED_OUTPATIENT_CLINIC_OR_DEPARTMENT_OTHER)
Admission: RE | Admit: 2012-05-19 | Discharge: 2012-05-19 | Disposition: A | Discharge status: Home / Self Care | Payer: 59 | Source: Ambulatory Visit | Attending: Orthopedic Surgery | Admitting: Orthopedic Surgery

## 2012-05-19 ENCOUNTER — Other Ambulatory Visit: Payer: Self-pay

## 2012-05-19 LAB — BASIC METABOLIC PANEL
Chloride: 104 mEq/L (ref 96–112)
Creatinine, Ser: 1.19 mg/dL (ref 0.50–1.35)
GFR calc Af Amer: 65 mL/min — ABNORMAL LOW (ref >90)
Potassium: 4.3 mEq/L (ref 3.5–5.1)
Sodium: 140 mEq/L (ref 135–145)

## 2012-05-19 NOTE — Progress Notes (Signed)
Dr Gypsy Balsam reviewed EKG. Asked that a repeat EKG be done the morning of surgery.

## 2012-05-19 NOTE — Progress Notes (Signed)
Here for bmet-ekg-to bring all meds dos and overnight bag just in case he needs to stay clearance  for surgery dr Kriste Basque

## 2012-05-25 ENCOUNTER — Encounter (HOSPITAL_BASED_OUTPATIENT_CLINIC_OR_DEPARTMENT_OTHER): Payer: Self-pay | Admitting: *Deleted

## 2012-05-25 ENCOUNTER — Encounter: Payer: Self-pay | Admitting: Physician Assistant

## 2012-05-25 ENCOUNTER — Encounter (HOSPITAL_BASED_OUTPATIENT_CLINIC_OR_DEPARTMENT_OTHER)
Admission: RE | Disposition: A | Discharge status: Home / Self Care | Payer: Self-pay | Source: Ambulatory Visit | Attending: Orthopedic Surgery

## 2012-05-25 ENCOUNTER — Other Ambulatory Visit: Payer: Self-pay | Admitting: Physician Assistant

## 2012-05-25 ENCOUNTER — Encounter (HOSPITAL_BASED_OUTPATIENT_CLINIC_OR_DEPARTMENT_OTHER): Payer: Self-pay | Admitting: Anesthesiology

## 2012-05-25 ENCOUNTER — Ambulatory Visit (HOSPITAL_BASED_OUTPATIENT_CLINIC_OR_DEPARTMENT_OTHER): Payer: 59 | Admitting: Anesthesiology

## 2012-05-25 ENCOUNTER — Ambulatory Visit (HOSPITAL_BASED_OUTPATIENT_CLINIC_OR_DEPARTMENT_OTHER)
Admission: RE | Admit: 2012-05-25 | Discharge: 2012-05-25 | Disposition: A | Discharge status: Home / Self Care | Payer: 59 | Source: Ambulatory Visit | Attending: Orthopedic Surgery | Admitting: Orthopedic Surgery

## 2012-05-25 DIAGNOSIS — M75101 Unspecified rotator cuff tear or rupture of right shoulder, not specified as traumatic: Secondary | ICD-10-CM | POA: Diagnosis present

## 2012-05-25 DIAGNOSIS — M7501 Adhesive capsulitis of right shoulder: Secondary | ICD-10-CM

## 2012-05-25 DIAGNOSIS — M24119 Other articular cartilage disorders, unspecified shoulder: Secondary | ICD-10-CM | POA: Insufficient documentation

## 2012-05-25 DIAGNOSIS — M7500 Adhesive capsulitis of unspecified shoulder: Secondary | ICD-10-CM | POA: Insufficient documentation

## 2012-05-25 DIAGNOSIS — E118 Type 2 diabetes mellitus with unspecified complications: Secondary | ICD-10-CM | POA: Insufficient documentation

## 2012-05-25 DIAGNOSIS — M66329 Spontaneous rupture of flexor tendons, unspecified upper arm: Secondary | ICD-10-CM | POA: Insufficient documentation

## 2012-05-25 DIAGNOSIS — Z01812 Encounter for preprocedural laboratory examination: Secondary | ICD-10-CM | POA: Insufficient documentation

## 2012-05-25 DIAGNOSIS — Z8619 Personal history of other infectious and parasitic diseases: Secondary | ICD-10-CM

## 2012-05-25 DIAGNOSIS — K219 Gastro-esophageal reflux disease without esophagitis: Secondary | ICD-10-CM

## 2012-05-25 DIAGNOSIS — M1990 Unspecified osteoarthritis, unspecified site: Secondary | ICD-10-CM

## 2012-05-25 DIAGNOSIS — M25819 Other specified joint disorders, unspecified shoulder: Secondary | ICD-10-CM | POA: Insufficient documentation

## 2012-05-25 DIAGNOSIS — F419 Anxiety disorder, unspecified: Secondary | ICD-10-CM

## 2012-05-25 DIAGNOSIS — M719 Bursopathy, unspecified: Secondary | ICD-10-CM | POA: Insufficient documentation

## 2012-05-25 DIAGNOSIS — Z01810 Encounter for preprocedural cardiovascular examination: Secondary | ICD-10-CM | POA: Insufficient documentation

## 2012-05-25 DIAGNOSIS — M67919 Unspecified disorder of synovium and tendon, unspecified shoulder: Secondary | ICD-10-CM | POA: Insufficient documentation

## 2012-05-25 DIAGNOSIS — E785 Hyperlipidemia, unspecified: Secondary | ICD-10-CM

## 2012-05-25 DIAGNOSIS — I10 Essential (primary) hypertension: Secondary | ICD-10-CM

## 2012-05-25 DIAGNOSIS — E119 Type 2 diabetes mellitus without complications: Secondary | ICD-10-CM | POA: Diagnosis present

## 2012-05-25 DIAGNOSIS — K649 Unspecified hemorrhoids: Secondary | ICD-10-CM | POA: Insufficient documentation

## 2012-05-25 HISTORY — PX: SHOULDER ARTHROSCOPY WITH ROTATOR CUFF REPAIR AND SUBACROMIAL DECOMPRESSION: SHX5686

## 2012-05-25 LAB — GLUCOSE, CAPILLARY
Glucose-Capillary: 112 mg/dL — ABNORMAL HIGH (ref 70–99)
Glucose-Capillary: 110 mg/dL — ABNORMAL HIGH (ref 70–99)

## 2012-05-25 LAB — POCT HEMOGLOBIN-HEMACUE: Hemoglobin: 13.5 g/dL (ref 13.0–17.0)

## 2012-05-25 SURGERY — SHOULDER ARTHROSCOPY WITH ROTATOR CUFF REPAIR AND SUBACROMIAL DECOMPRESSION
Anesthesia: General | Site: Shoulder | Laterality: Right | Wound class: Clean

## 2012-05-25 MED ORDER — EPINEPHRINE HCL 1 MG/ML IJ SOLN
INTRAMUSCULAR | Status: DC | PRN
  Administered 2012-05-25: 1 mg

## 2012-05-25 MED ORDER — MIDAZOLAM HCL 2 MG/2ML IJ SOLN
1.0000 mg | INTRAMUSCULAR | Status: DC | PRN
  Administered 2012-05-25: 2 mg via INTRAVENOUS

## 2012-05-25 MED ORDER — FENTANYL CITRATE 0.05 MG/ML IJ SOLN
INTRAMUSCULAR | Status: DC | PRN
  Administered 2012-05-25: 100 ug via INTRAVENOUS

## 2012-05-25 MED ORDER — SUCCINYLCHOLINE CHLORIDE 20 MG/ML IJ SOLN
INTRAMUSCULAR | Status: DC | PRN
  Administered 2012-05-25: 100 mg via INTRAVENOUS

## 2012-05-25 MED ORDER — EPHEDRINE SULFATE 50 MG/ML IJ SOLN
INTRAMUSCULAR | Status: DC | PRN
  Administered 2012-05-25 (×3): 10 mg via INTRAVENOUS

## 2012-05-25 MED ORDER — ONDANSETRON HCL 4 MG/2ML IJ SOLN
INTRAMUSCULAR | Status: DC | PRN
  Administered 2012-05-25: 4 mg via INTRAVENOUS

## 2012-05-25 MED ORDER — LACTATED RINGERS IV SOLN
INTRAVENOUS | Status: DC
  Administered 2012-05-25 (×2): via INTRAVENOUS

## 2012-05-25 MED ORDER — HYDROMORPHONE HCL PF 1 MG/ML IJ SOLN: 0.2500 mg | INTRAMUSCULAR | Status: DC | PRN

## 2012-05-25 MED ORDER — CHLORHEXIDINE GLUCONATE 4 % EX LIQD: 60.0000 mL | Freq: Once | CUTANEOUS | Status: DC

## 2012-05-25 MED ORDER — PROMETHAZINE HCL 25 MG/ML IJ SOLN: 6.2500 mg | INTRAMUSCULAR | Status: DC | PRN

## 2012-05-25 MED ORDER — CEFAZOLIN SODIUM-DEXTROSE 2-3 GM-% IV SOLR: 2.0000 g | INTRAVENOUS | Status: DC

## 2012-05-25 MED ORDER — MIDAZOLAM HCL 2 MG/2ML IJ SOLN: 0.5000 mg | Freq: Once | INTRAMUSCULAR | Status: DC | PRN

## 2012-05-25 MED ORDER — PROPOFOL 10 MG/ML IV BOLUS
INTRAVENOUS | Status: DC | PRN
  Administered 2012-05-25: 150 mg via INTRAVENOUS

## 2012-05-25 MED ORDER — BUPIVACAINE-EPINEPHRINE PF 0.5-1:200000 % IJ SOLN
INTRAMUSCULAR | Status: DC | PRN
  Administered 2012-05-25: 30 mL

## 2012-05-25 MED ORDER — SODIUM CHLORIDE 0.9 % IR SOLN
Status: DC | PRN
  Administered 2012-05-25: 3000 mL

## 2012-05-25 MED ORDER — MIDAZOLAM HCL 2 MG/ML PO SYRP: 0.5000 mg/kg | ORAL_SOLUTION | Freq: Once | ORAL | Status: DC | PRN

## 2012-05-25 MED ORDER — OXYCODONE-ACETAMINOPHEN 10-325 MG PO TABS: ORAL_TABLET | ORAL | Status: DC

## 2012-05-25 MED ORDER — LACTATED RINGERS IV SOLN
INTRAVENOUS | Status: DC
  Administered 2012-05-25 (×2): via INTRAVENOUS

## 2012-05-25 MED ORDER — MEPERIDINE HCL 25 MG/ML IJ SOLN: 6.2500 mg | INTRAMUSCULAR | Status: DC | PRN

## 2012-05-25 MED ORDER — OXYCODONE HCL 5 MG PO TABS: 5.0000 mg | ORAL_TABLET | Freq: Once | ORAL | Status: DC | PRN

## 2012-05-25 MED ORDER — DEXAMETHASONE SODIUM PHOSPHATE 4 MG/ML IJ SOLN: INTRAMUSCULAR | Status: DC | PRN

## 2012-05-25 MED ORDER — OXYCODONE HCL 5 MG/5ML PO SOLN: 5.0000 mg | Freq: Once | ORAL | Status: DC | PRN

## 2012-05-25 MED ORDER — FENTANYL CITRATE 0.05 MG/ML IJ SOLN
50.0000 ug | INTRAMUSCULAR | Status: DC | PRN
  Administered 2012-05-25: 50 ug via INTRAVENOUS

## 2012-05-25 SURGICAL SUPPLY — 81 items
ANCH SUT SWLK 19.1X5.5 CLS EL (Anchor) ×1 IMPLANT
ANCHOR PEEK SWIVEL LOCK 5.5 (Anchor) ×1 IMPLANT
APL SKNCLS STERI-STRIP NONHPOA (GAUZE/BANDAGES/DRESSINGS)
BENZOIN TINCTURE PRP APPL 2/3 (GAUZE/BANDAGES/DRESSINGS) IMPLANT
BLADE CUDA 5.5 (BLADE) IMPLANT
BLADE CUTTER GATOR 3.5 (BLADE) ×2 IMPLANT
BLADE GREAT WHITE 4.2 (BLADE) IMPLANT
BLADE SURG 15 STRL LF DISP TIS (BLADE) IMPLANT
BLADE SURG 15 STRL SS (BLADE)
BLADE SURG ROTATE 9660 (MISCELLANEOUS) IMPLANT
BNDG COHESIVE 4X5 TAN STRL (GAUZE/BANDAGES/DRESSINGS) ×2 IMPLANT
BUR OVAL 6.0 (BURR) ×3 IMPLANT
CANISTER OMNI JUG 16 LITER (MISCELLANEOUS) ×2 IMPLANT
CANISTER SUCTION 2500CC (MISCELLANEOUS) IMPLANT
CANNULA TWIST IN 8.25X7CM (CANNULA) ×2 IMPLANT
DECANTER SPIKE VIAL GLASS SM (MISCELLANEOUS) IMPLANT
DRAPE SHOULDER BEACH CHAIR (DRAPES) ×2 IMPLANT
DRAPE U-SHAPE 47X51 STRL (DRAPES) ×4 IMPLANT
DRSG PAD ABDOMINAL 8X10 ST (GAUZE/BANDAGES/DRESSINGS) ×2 IMPLANT
DURAPREP 26ML APPLICATOR (WOUND CARE) ×2 IMPLANT
ELECT REM PT RETURN 9FT ADLT (ELECTROSURGICAL) ×2
ELECTRODE REM PT RTRN 9FT ADLT (ELECTROSURGICAL) ×1 IMPLANT
GAUZE XEROFORM 1X8 LF (GAUZE/BANDAGES/DRESSINGS) ×2 IMPLANT
GLOVE BIO SURGEON STRL SZ7 (GLOVE) ×1 IMPLANT
GLOVE BIOGEL M 7.0 STRL (GLOVE) ×1 IMPLANT
GLOVE BIOGEL PI IND STRL 7.0 (GLOVE) IMPLANT
GLOVE BIOGEL PI IND STRL 7.5 (GLOVE) ×1 IMPLANT
GLOVE BIOGEL PI INDICATOR 7.0 (GLOVE) ×1
GLOVE BIOGEL PI INDICATOR 7.5 (GLOVE) ×2
GLOVE SS BIOGEL STRL SZ 7.5 (GLOVE) ×1 IMPLANT
GLOVE SUPERSENSE BIOGEL SZ 7.5 (GLOVE) ×1
GOWN PREVENTION PLUS XLARGE (GOWN DISPOSABLE) ×3 IMPLANT
GOWN PREVENTION PLUS XXLARGE (GOWN DISPOSABLE) ×1 IMPLANT
LOOP 2 FIBERLINK CLOSED (SUTURE) ×1 IMPLANT
NDL 1/2 CIR CATGUT .05X1.09 (NEEDLE) IMPLANT
NDL SAFETY ECLIPSE 18X1.5 (NEEDLE) ×1 IMPLANT
NDL SCORPION MULTI FIRE (NEEDLE) IMPLANT
NDL SUT 6 .5 CRC .975X.05 MAYO (NEEDLE) IMPLANT
NEEDLE 1/2 CIR CATGUT .05X1.09 (NEEDLE) IMPLANT
NEEDLE HYPO 18GX1.5 SHARP (NEEDLE) ×2
NEEDLE MAYO TAPER (NEEDLE)
NEEDLE SCORPION MULTI FIRE (NEEDLE) ×2 IMPLANT
PACK ARTHROSCOPY DSU (CUSTOM PROCEDURE TRAY) ×2 IMPLANT
PACK BASIN DAY SURGERY FS (CUSTOM PROCEDURE TRAY) ×2 IMPLANT
PAD ALCOHOL SWAB (MISCELLANEOUS) ×4 IMPLANT
PENCIL BUTTON HOLSTER BLD 10FT (ELECTRODE) IMPLANT
SET ARTHROSCOPY TUBING (MISCELLANEOUS) ×2
SET ARTHROSCOPY TUBING LN (MISCELLANEOUS) ×1 IMPLANT
SHEET MEDIUM DRAPE 40X70 STRL (DRAPES) IMPLANT
SLEEVE SCD COMPRESS KNEE MED (MISCELLANEOUS) ×1 IMPLANT
SLING ARM FOAM STRAP LRG (SOFTGOODS) IMPLANT
SLING ARM FOAM STRAP MED (SOFTGOODS) IMPLANT
SLING ARM FOAM STRAP XLG (SOFTGOODS) IMPLANT
SLING ARM IMMOBILIZER MED (SOFTGOODS) IMPLANT
SLING ULTRA II LARGE (SOFTGOODS) ×1 IMPLANT
SLING ULTRA III MED (ORTHOPEDIC SUPPLIES) IMPLANT
SPONGE GAUZE 4X4 12PLY (GAUZE/BANDAGES/DRESSINGS) ×2 IMPLANT
SPONGE LAP 4X18 X RAY DECT (DISPOSABLE) IMPLANT
STRIP CLOSURE SKIN 1/2X4 (GAUZE/BANDAGES/DRESSINGS) IMPLANT
SUCTION FRAZIER TIP 10 FR DISP (SUCTIONS) IMPLANT
SUT ETHILON 3 0 PS 1 (SUTURE) ×2 IMPLANT
SUT FIBERWIRE #2 38 T-5 BLUE (SUTURE)
SUT PDS AB 2-0 CT2 27 (SUTURE) IMPLANT
SUT PROLENE 3 0 PS 2 (SUTURE) IMPLANT
SUT TIGER TAPE 7 IN WHITE (SUTURE) ×1 IMPLANT
SUT VIC AB 0 SH 27 (SUTURE) IMPLANT
SUT VIC AB 2-0 PS2 27 (SUTURE) IMPLANT
SUT VIC AB 2-0 SH 27 (SUTURE)
SUT VIC AB 2-0 SH 27XBRD (SUTURE) IMPLANT
SUTURE FIBERWR #2 38 T-5 BLUE (SUTURE) IMPLANT
SYR 20CC LL (SYRINGE) IMPLANT
SYR 5ML LL (SYRINGE) ×2 IMPLANT
SYR BULB 3OZ (MISCELLANEOUS) IMPLANT
TAPE CLOTH SURG 6X10 WHT LF (GAUZE/BANDAGES/DRESSINGS) ×1 IMPLANT
TAPE FIBER 2MM 7IN #2 BLUE (SUTURE) IMPLANT
TAPE HYPAFIX 6X30 (GAUZE/BANDAGES/DRESSINGS) IMPLANT
TAPE STRIPS DRAPE STRL (GAUZE/BANDAGES/DRESSINGS) ×2 IMPLANT
TOWEL OR 17X24 6PK STRL BLUE (TOWEL DISPOSABLE) ×2 IMPLANT
TUBE CONNECTING 20X1/4 (TUBING) IMPLANT
WAND STAR VAC 90 (SURGICAL WAND) ×2 IMPLANT
WATER STERILE IRR 1000ML POUR (IV SOLUTION) ×2 IMPLANT

## 2012-05-25 NOTE — Brief Op Note (Signed)
05/25/2012  2:15 PM  PATIENT:  Dale Anderson  78 y.o. male  PRE-OPERATIVE DIAGNOSIS:  right shoulder impingement syndrome, degenerative arthritis shoulder A/C joint complete rupture of rotator cuff adhesive capsulitis frozen shoulder   POST-OPERATIVE DIAGNOSIS:  * No post-op diagnosis entered *  PROCEDURE:  Right shoulder EUA  Scope with manipulation with RTC repair with lysis of adhesions and partial labrum tear debridement and SAD  SURGEON:  Surgeon(s) and Role:    * Nilda Simmer, MD - Primary  PHYSICIAN ASSISTANT: Kirstin Shepperson PA-C  ASSISTANTS: Kirstin Shepperson PA-C   ANESTHESIA:   general  EBL:  Total I/O In: 1000 [I.V.:1000] Out: -   BLOOD ADMINISTERED:none  DRAINS: none   LOCAL MEDICATIONS USED:  NONE  SPECIMEN:  No Specimen  DISPOSITION OF SPECIMEN:  N/A  COUNTS:  YES  TOURNIQUET:  * No tourniquets in log *  DICTATION: .Note written in EPIC  PLAN OF CARE: Discharge to home after PACU  PATIENT DISPOSITION:  PACU - hemodynamically stable.   Delay start of Pharmacological VTE agent (>24hrs) due to surgical blood loss or risk of bleeding: not applicable

## 2012-05-25 NOTE — Anesthesia Procedure Notes (Addendum)
Anesthesia Regional Block:  Interscalene brachial plexus block  Pre-Anesthetic Checklist: ,, timeout performed, Correct Patient, Correct Site, Correct Laterality, Correct Procedure, Correct Position, site marked, Risks and benefits discussed,  Surgical consent,  Pre-op evaluation,  At surgeon's request and post-op pain management  Laterality: Right  Prep: chloraprep       Needles:  Injection technique: Single-shot  Needle Type: Stimulator Needle - 40     Needle Length: 4cm  Needle Gauge: 22 and 22 G    Additional Needles:  Procedures: nerve stimulator Interscalene brachial plexus block  Nerve Stimulator or Paresthesia:  Response: forearm twitch, 0.4 mA, 0.1 ms,   Additional Responses:   Narrative:  Start time: 05/25/2012 11:40 AM End time: 05/25/2012 11:47 AM Injection made incrementally with aspirations every 5 mL.  Performed by: Personally  Anesthesiologist: Sandford Craze, MD  Additional Notes: Pt identified in Holding room.  Monitors applied. Working IV access confirmed. Sterile prep R neck.  #22ga PNS to forearm twitch at 0.42mA threshold.  30cc 0.5% Bupivacaine with 1:200k epi injected incrementally after negative test dose.  Patient asymptomatic, VSS, no heme aspirated, tolerated well.   Sandford Craze, MD  Interscalene brachial plexus block Procedure Name: Intubation Date/Time: 05/25/2012 1:00 PM Performed by: Zenia Resides D Pre-anesthesia Checklist: Patient identified, Emergency Drugs available, Suction available and Patient being monitored Patient Re-evaluated:Patient Re-evaluated prior to inductionOxygen Delivery Method: Circle System Utilized Preoxygenation: Pre-oxygenation with 100% oxygen Intubation Type: IV induction Ventilation: Mask ventilation without difficulty Laryngoscope Size: Mac and 3 Grade View: Grade I Tube type: Oral Tube size: 8.0 mm Number of attempts: 1 Airway Equipment and Method: stylet and oral airway Placement Confirmation: ETT inserted  through vocal cords under direct vision,  positive ETCO2 and breath sounds checked- equal and bilateral Tube secured with: Tape Dental Injury: Teeth and Oropharynx as per pre-operative assessment

## 2012-05-25 NOTE — H&P (Signed)
Dale Anderson is an 78 y.o. male.   Chief Complaint: right shoulder pain, weakness, and stiffness HPI: Dale Anderson is a 78 year old seen for follow-up from his persistent right shoulder pain. He had a right shoulder MRI on 07/14/11 that showed a high grade partial versus complete rotator cuff tear without muscular atrophy with impingement He's been trying to live with this. We injected his shoulder previously and placed him on a Sterapred dose pack in June but this did not help and he's having decreased range of motion and increased pain.  Pain is worse with overhead activity and relieved by rest but he has night pain as well  Past Medical History  Diagnosis Date  . Recurrent bronchiectasis   . Hypertension   . Hyperlipidemia   . Diabetes mellitus   . GERD (gastroesophageal reflux disease)   . Hemorrhoid   . Nodular prostate without urinary obstruction   . DJD (degenerative joint disease)   . Anxiety   . History of shingles   . Tear of right rotator cuff   . Adhesive capsulitis of right shoulder     Past Surgical History  Procedure Date  . Rotator cuff repair 1995    Left.  Dr. Meade Maw  . Inguinal hernia repair 2006    Right.  Dr. Lurene Shadow  . Colonoscopy 1998  . Hand reconstruction     right    Family History  Problem Relation Age of Onset  . Emphysema Other     cousin  . Heart disease Father     MI at age 49  . Colon cancer Neg Hx    Social History:  reports that he has never smoked. He has never used smokeless tobacco. He reports that he does not drink alcohol or use illicit drugs.  Allergies: No Known Allergies  Current Outpatient Prescriptions on File Prior to Visit  Medication Sig Dispense Refill  . ALPRAZolam (XANAX) 0.5 MG tablet Take 1/2 to 1 tablet by mouth three times daily as needed for nerves  90 tablet  5  . ASPIRIN LOW DOSE 81 MG EC tablet TAKE 1 TABLET BY MOUTH EVERY DAY  90 tablet  3  . atorvastatin (LIPITOR) 20 MG tablet TAKE 1 TABLET BY MOUTH DAILY.   90 tablet  3  . Cholecalciferol (VITAMIN D) 1000 UNITS capsule Take 1,000 Units by mouth daily.        . fluocinonide (LIDEX) 0.05 % external solution Apply to the affected area as directed  80 mL  11  . lisinopril (PRINIVIL,ZESTRIL) 10 MG tablet Take 1 tablet (10 mg total) by mouth daily.  90 tablet  3  . metFORMIN (GLUCOPHAGE) 500 MG tablet Take 1 tablet (500 mg total) by mouth 2 (two) times daily.  180 tablet  3  . metFORMIN (GLUCOPHAGE) 500 MG tablet TAKE 1 TABLET BY MOUTH 2 TIMES DAILY.  180 tablet  3  . Multiple Vitamin (MULTIVITAMIN) capsule Take 1 capsule by mouth daily.        Marland Kitchen NEXIUM 40 MG capsule TAKE 1 CAPSULE BY MOUTH ONCE DAILY BEFORE BREAKFAST  90 capsule  3  . Tamsulosin HCl (FLOMAX) 0.4 MG CAPS Take 0.4 mg by mouth daily.        Gilman Schmidt TEST test strip TEST BLOOD SUGAR ONCE DAILY  1 each  3  . ZIRGAN 0.15 % GEL Apply 1 application topically Daily.       Current Facility-Administered Medications on File Prior to Visit  Medication Dose  Route Frequency Provider Last Rate Last Dose  . [DISCONTINUED] 0.9 %  sodium chloride infusion  500 mL Intravenous Continuous Louis Meckel, MD        (Not in a hospital admission)  No results found for this or any previous visit (from the past 48 hour(s)). No results found.  Review of Systems  Constitutional: Negative.   HENT: Negative.   Eyes: Negative.   Respiratory: Negative.   Cardiovascular: Negative.   Gastrointestinal: Negative.   Genitourinary: Negative.   Musculoskeletal: Positive for joint pain.       Right shoulder pain and stiffness  Skin: Negative.   Neurological: Negative.   Endo/Heme/Allergies: Negative.   Psychiatric/Behavioral: Negative.     Blood pressure 151/80, pulse 70, height 5\' 11"  (1.803 m), weight 73.936 kg (163 lb). Physical Exam  Constitutional: He is oriented to person, place, and time. He appears well-developed and well-nourished.  HENT:  Head: Normocephalic and atraumatic.  Mouth/Throat:  Oropharynx is clear and moist.  Eyes: EOM are normal. Pupils are equal, round, and reactive to light.  Neck: Neck supple.  Cardiovascular: Normal rate and regular rhythm.   Respiratory: Effort normal.  GI: Soft.  Genitourinary:       Not pertinent to current symptomatology therefore not examined.  Musculoskeletal:       Examination of his right shoulder reveals forward flexion of 160 abduction of 150, internal and external rotation of 50 degrees with pain and mild weakness, no instability. Exam of the left shoulder reveals full range of motion without pain swelling weakness or instability. Vascular exam: pulses 2+ and symmetric.  Neurological: He is alert and oriented to person, place, and time.  Skin: Skin is warm and dry.  Psychiatric: He has a normal mood and affect. His behavior is normal. Judgment and thought content normal.     Assessment Patient Active Problem List  Diagnosis  . DIABETES MELLITUS  . HYPERLIPIDEMIA  . ANXIETY  . HYPERTENSION  . HEMORRHOIDS  . BRONCHITIS, RECURRENT  . GERD  . URETHRITIS  . NODULAR PROSTATE WITHOUT URINARY OBSTRUCTION  . DEGENERATIVE JOINT DISEASE  . SHINGLES, HX OF  . Tick bite  . Hypertension  . Hyperlipidemia  . Diabetes mellitus  . GERD (gastroesophageal reflux disease)  . Hemorrhoid  . DJD (degenerative joint disease)  . Anxiety  . History of shingles  . Tear of right rotator cuff  . Adhesive capsulitis of right shoulder     Plan I talk to him and his wife about this in detail.  I do think with these findings and lack of response to conservative treatment that we need to proceed with right shoulder exam under anesthesia manipulation with arthroscopic rotator cuff tear versus debridement with subacromial decompression and distal clavicle excision. Discussed risks benefits and possible complications in detail and they understand this completely. He has been cleared preoperatively by Dr. Kriste Basque. We'll plan on setting him up for this  when he is cleared.   Dale Anderson J 05/25/2012, 10:19 AM

## 2012-05-25 NOTE — Anesthesia Postprocedure Evaluation (Signed)
  Anesthesia Post-op Note  Patient: Dale Anderson  Procedure(s) Performed: Procedure(s) (LRB) with comments: SHOULDER ARTHROSCOPY WITH ROTATOR CUFF REPAIR AND SUBACROMIAL DECOMPRESSION (Right) - Right Shoulder Arthroscopy shoulder decompression subacromial partial acromioplasty with coracoaromial release, arthroscopy shoulder distal claviculectomy, arthroscopy shoulder with rotator cuff repair, manipulation shoulder under anesthesia   Patient Location: PACU  Anesthesia Type:GA combined with regional for post-op pain  Level of Consciousness: awake, alert , oriented and patient cooperative  Airway and Oxygen Therapy: Patient Spontanous Breathing  Post-op Pain: none  Post-op Assessment: Post-op Vital signs reviewed, Patient's Cardiovascular Status Stable, Respiratory Function Stable, Patent Airway, No signs of Nausea or vomiting and Pain level controlled  Post-op Vital Signs: Reviewed and stable  Complications: No apparent anesthesia complications

## 2012-05-25 NOTE — Progress Notes (Signed)
Assisted Dr. Jackson with right, ultrasound guided, interscalene  block. Side rails up, monitors on throughout procedure. See vital signs in flow sheet. Tolerated Procedure well.  

## 2012-05-25 NOTE — Anesthesia Preprocedure Evaluation (Signed)
Anesthesia Evaluation  Patient identified by MRN, date of birth, ID band Patient awake    Reviewed: Allergy & Precautions, H&P , NPO status , Patient's Chart, lab work & pertinent test results  History of Anesthesia Complications Negative for: history of anesthetic complications  Airway Mallampati: II TM Distance: >3 FB Neck ROM: Full    Dental  (+) Teeth Intact, Implants, Caps, Dental Advisory Given and Missing   Pulmonary neg pulmonary ROS,  breath sounds clear to auscultation  Pulmonary exam normal       Cardiovascular hypertension, Pt. on medications Rhythm:Regular Rate:Normal     Neuro/Psych negative neurological ROS     GI/Hepatic Neg liver ROS, GERD-  Medicated and Controlled,  Endo/Other  diabetes (glu 112), Well Controlled, Type 2, Oral Hypoglycemic Agents  Renal/GU negative Renal ROS     Musculoskeletal   Abdominal   Peds  Hematology negative hematology ROS (+)   Anesthesia Other Findings   Reproductive/Obstetrics                           Anesthesia Physical Anesthesia Plan  ASA: II  Anesthesia Plan: General   Post-op Pain Management:    Induction: Intravenous  Airway Management Planned: Oral ETT  Additional Equipment:   Intra-op Plan:   Post-operative Plan: Extubation in OR  Informed Consent: I have reviewed the patients History and Physical, chart, labs and discussed the procedure including the risks, benefits and alternatives for the proposed anesthesia with the patient or authorized representative who has indicated his/her understanding and acceptance.   Dental advisory given  Plan Discussed with: Surgeon and CRNA  Anesthesia Plan Comments: (Plan routine monitors, GETA with interscalene block for post op analgesia)        Anesthesia Quick Evaluation

## 2012-05-25 NOTE — H&P (View-Only) (Signed)
Dale Anderson is an 78 y.o. male.   Chief Complaint: right shoulder pain, weakness, and stiffness HPI: Dale Anderson is a 78 year old seen for follow-up from his persistent right shoulder pain. He had a right shoulder MRI on 07/14/11 that showed a high grade partial versus complete rotator cuff tear without muscular atrophy with impingement He's been trying to live with this. We injected his shoulder previously and placed him on a Sterapred dose pack in June but this did not help and he's having decreased range of motion and increased pain.  Pain is worse with overhead activity and relieved by rest but he has night pain as well  Past Medical History  Diagnosis Date  . Recurrent bronchiectasis   . Hypertension   . Hyperlipidemia   . Diabetes mellitus   . GERD (gastroesophageal reflux disease)   . Hemorrhoid   . Nodular prostate without urinary obstruction   . DJD (degenerative joint disease)   . Anxiety   . History of shingles   . Tear of right rotator cuff   . Adhesive capsulitis of right shoulder     Past Surgical History  Procedure Date  . Rotator cuff repair 1995    Left.  Dr. Presson  . Inguinal hernia repair 2006    Right.  Dr. Ballen  . Colonoscopy 1998  . Hand reconstruction     right    Family History  Problem Relation Age of Onset  . Emphysema Other     cousin  . Heart disease Father     MI at age 89  . Colon cancer Neg Hx    Social History:  reports that he has never smoked. He has never used smokeless tobacco. He reports that he does not drink alcohol or use illicit drugs.  Allergies: No Known Allergies  Current Outpatient Prescriptions on File Prior to Visit  Medication Sig Dispense Refill  . ALPRAZolam (XANAX) 0.5 MG tablet Take 1/2 to 1 tablet by mouth three times daily as needed for nerves  90 tablet  5  . ASPIRIN LOW DOSE 81 MG EC tablet TAKE 1 TABLET BY MOUTH EVERY DAY  90 tablet  3  . atorvastatin (LIPITOR) 20 MG tablet TAKE 1 TABLET BY MOUTH DAILY.   90 tablet  3  . Cholecalciferol (VITAMIN D) 1000 UNITS capsule Take 1,000 Units by mouth daily.        . fluocinonide (LIDEX) 0.05 % external solution Apply to the affected area as directed  80 mL  11  . lisinopril (PRINIVIL,ZESTRIL) 10 MG tablet Take 1 tablet (10 mg total) by mouth daily.  90 tablet  3  . metFORMIN (GLUCOPHAGE) 500 MG tablet Take 1 tablet (500 mg total) by mouth 2 (two) times daily.  180 tablet  3  . metFORMIN (GLUCOPHAGE) 500 MG tablet TAKE 1 TABLET BY MOUTH 2 TIMES DAILY.  180 tablet  3  . Multiple Vitamin (MULTIVITAMIN) capsule Take 1 capsule by mouth daily.        . NEXIUM 40 MG capsule TAKE 1 CAPSULE BY MOUTH ONCE DAILY BEFORE BREAKFAST  90 capsule  3  . Tamsulosin HCl (FLOMAX) 0.4 MG CAPS Take 0.4 mg by mouth daily.        . TRUETRACK TEST test strip TEST BLOOD SUGAR ONCE DAILY  1 each  3  . ZIRGAN 0.15 % GEL Apply 1 application topically Daily.       Current Facility-Administered Medications on File Prior to Visit  Medication Dose   Route Frequency Provider Last Rate Last Dose  . [DISCONTINUED] 0.9 %  sodium chloride infusion  500 mL Intravenous Continuous Robert D Kaplan, MD        (Not in a hospital admission)  No results found for this or any previous visit (from the past 48 hour(s)). No results found.  Review of Systems  Constitutional: Negative.   HENT: Negative.   Eyes: Negative.   Respiratory: Negative.   Cardiovascular: Negative.   Gastrointestinal: Negative.   Genitourinary: Negative.   Musculoskeletal: Positive for joint pain.       Right shoulder pain and stiffness  Skin: Negative.   Neurological: Negative.   Endo/Heme/Allergies: Negative.   Psychiatric/Behavioral: Negative.     Blood pressure 151/80, pulse 70, height 5' 11" (1.803 m), weight 73.936 kg (163 lb). Physical Exam  Constitutional: He is oriented to person, place, and time. He appears well-developed and well-nourished.  HENT:  Head: Normocephalic and atraumatic.  Mouth/Throat:  Oropharynx is clear and moist.  Eyes: EOM are normal. Pupils are equal, round, and reactive to light.  Neck: Neck supple.  Cardiovascular: Normal rate and regular rhythm.   Respiratory: Effort normal.  GI: Soft.  Genitourinary:       Not pertinent to current symptomatology therefore not examined.  Musculoskeletal:       Examination of his right shoulder reveals forward flexion of 160 abduction of 150, internal and external rotation of 50 degrees with pain and mild weakness, no instability. Exam of the left shoulder reveals full range of motion without pain swelling weakness or instability. Vascular exam: pulses 2+ and symmetric.  Neurological: He is alert and oriented to person, place, and time.  Skin: Skin is warm and dry.  Psychiatric: He has a normal mood and affect. His behavior is normal. Judgment and thought content normal.     Assessment Patient Active Problem List  Diagnosis  . DIABETES MELLITUS  . HYPERLIPIDEMIA  . ANXIETY  . HYPERTENSION  . HEMORRHOIDS  . BRONCHITIS, RECURRENT  . GERD  . URETHRITIS  . NODULAR PROSTATE WITHOUT URINARY OBSTRUCTION  . DEGENERATIVE JOINT DISEASE  . SHINGLES, HX OF  . Tick bite  . Hypertension  . Hyperlipidemia  . Diabetes mellitus  . GERD (gastroesophageal reflux disease)  . Hemorrhoid  . DJD (degenerative joint disease)  . Anxiety  . History of shingles  . Tear of right rotator cuff  . Adhesive capsulitis of right shoulder     Plan I talk to him and his wife about this in detail.  I do think with these findings and lack of response to conservative treatment that we need to proceed with right shoulder exam under anesthesia manipulation with arthroscopic rotator cuff tear versus debridement with subacromial decompression and distal clavicle excision. Discussed risks benefits and possible complications in detail and they understand this completely. He has been cleared preoperatively by Dr. Nadel. We'll plan on setting him up for this  when he is cleared.   Thoma Paulsen J 05/25/2012, 10:19 AM    

## 2012-05-25 NOTE — Interval H&P Note (Signed)
History and Physical Interval Note:  05/25/2012 12:29 PM  Dale Anderson  has presented today for surgery, with the diagnosis of right shoulder impingement syndrome shoulder degenerative arthristis shoulder A/C joint complete rupture of rotator cuff adhesive capsulitis frozen shoulder   The various methods of treatment have been discussed with the patient and family. After consideration of risks, benefits and other options for treatment, the patient has consented to  Procedure(s) (LRB) with comments: SHOULDER ARTHROSCOPY WITH ROTATOR CUFF REPAIR AND SUBACROMIAL DECOMPRESSION (Right) - Right Shoulder Arthroscopy shoulder decompression subacromial partial acromioplasty with coracoaromial release, arthroscopy shoulder distal claviculectomy, arthroscopy shoulder with rotator cuff repair, manipulation shoulder under anesthesia  as a surgical intervention .  The patient's history has been reviewed, patient examined, no change in status, stable for surgery.  I have reviewed the patient's chart and labs.  Questions were answered to the patient's satisfaction.     Salvatore Marvel A

## 2012-05-25 NOTE — Transfer of Care (Signed)
Immediate Anesthesia Transfer of Care Note  Patient: Dale Anderson  Procedure(s) Performed: Procedure(s) (LRB) with comments: SHOULDER ARTHROSCOPY WITH ROTATOR CUFF REPAIR AND SUBACROMIAL DECOMPRESSION (Right) - Right Shoulder Arthroscopy shoulder decompression subacromial partial acromioplasty with coracoaromial release, arthroscopy shoulder distal claviculectomy, arthroscopy shoulder with rotator cuff repair, manipulation shoulder under anesthesia   Patient Location: PACU  Anesthesia Type:General and Regional  Level of Consciousness: awake, alert  and oriented  Airway & Oxygen Therapy: Patient Spontanous Breathing and Patient connected to face mask oxygen  Post-op Assessment: Report given to PACU RN and Post -op Vital signs reviewed and stable  Post vital signs: Reviewed and stable  Complications: No apparent anesthesia complications

## 2012-05-26 ENCOUNTER — Encounter (HOSPITAL_BASED_OUTPATIENT_CLINIC_OR_DEPARTMENT_OTHER): Payer: Self-pay | Admitting: Orthopedic Surgery

## 2012-05-27 NOTE — Op Note (Signed)
NAMEJAWANZA, ZAMBITO            ACCOUNT NO.:  [id number]  MEDICAL RECORD NO.:  [id number]  LOCATION:                                 FACILITY:  PHYSICIAN:  Elana Alm. Thurston Hole, M.D. DATE OF BIRTH:  26-Oct-1929  DATE OF PROCEDURE:  05/25/2012 DATE OF DISCHARGE:                              OPERATIVE REPORT   PREOPERATIVE DIAGNOSES: 1. Right shoulder rotator cuff tear. 2. Right shoulder partial labrum tear. 3. Right shoulder biceps tendon rupture. 4. Right shoulder impingement. 5. Right shoulder adhesive capsulitis.  POSTOPERATIVE DIAGNOSES: 1. Right shoulder rotator cuff tear. 2. Right shoulder partial labrum tear. 3. Right shoulder biceps tendon rupture. 4. Right shoulder impingement. 5. Right shoulder adhesive capsulitis.  PROCEDURE: 1. Right shoulder EUA followed by manipulation. 2. Right shoulder arthroscopic lysis of adhesions. 3. Right shoulder rotator cuff repair using Arthrex swivel lock x1+     fiber tape and suture link. 4. Right shoulder debridement, partial labrum tear. 5. Right shoulder subacromial decompression.  SURGEON:  Elana Alm. Thurston Hole, MD  ASSISTANT:  Julien Girt, PA-C  ANESTHESIA:  General.  OPERATIVE TIME:  One hour and 15 minutes.  COMPLICATIONS:  None.  INDICATION FOR PROCEDURE:  Mr. Maring is an 78 year old gentleman who has had significant right shoulder pain for a year increasing in nature with exam and MRI documenting rotator cuff tear, adhesive capsulitis, partial labrum tear, biceps tendon rupture with impingement.  He has failed multiple conservative modalities and is now to undergo arthroscopy.  DESCRIPTION:  Mr. Turay was brought to the operating room on May 25, 2012 after an interscalene block was placed in the holding room by anesthesia.  He was placed on operative table in supine position.  He received antibiotics preoperatively for prophylaxis.  After being placed under general anesthesia, his right shoulder was  examined.  Initial range of motion showed forward flexion of 160, abduction of 150, internal and rotation of 60 degrees.  A gentle manipulation was carried out breaking up soft adhesions and improving range of motion to near full range of motion.  Shoulder remained stable ligamentous exam.  He was then placed in beach chair position and his shoulder and arm was prepped using sterile DuraPrep and draped using sterile technique.  Time- out procedure was called and the correct right shoulder identified. Initially, through a posterior arthroscopic portal, the arthroscope with a pump attached was placed into an anterior portal and arthroscopic probe was placed.  On initial inspection, the articular cartilage in the glenohumeral joint showed grade 1 and 2 chondromalacia.  He had partial tearing of the anterior, superior, and posterior labrum 25-30%, which was debrided.  The anterior-inferior labrum and anterior-inferior glenohumeral ligament complex was intact.  The biceps tendon was non- existent in the joint and a small stump was debrided.  The superior labrum was otherwise intact.  Rotator cuff showed a complete tear of the supraspinatus and the anterior 50% in the infraspinatus.  The rest of rotator cuff was intact.  Inferior capsular recess free of pathology. Subacromial space was entered and a lateral arthroscopic portal was made.  Moderately thickened bursitis was resected.  Impingement was noted and a subacromial decompression was carried out removing 6-8 mm of  the undersurface of the anterior, anterolateral, and anteromedial acromion and CA ligament release carried out as well.  The [facility] joint was not pathologic and thus was not resected.  The rotator cuff was then repaired through an accessory lateral portal using Arthrex fiber tape in a mattress suture technique and a fiber link.  All of these were placed through the swivel lock which was deployed in the lateral aspect of the greater  tuberosity thus giving a firm and tight fixation to the rotator cuff tear back to its near anatomic position on the greater tuberosity with firm and tight fixation.  The shoulder could be brought to a satisfactory range of motion with no excessive tension but excellent stability on the repair.  At this point, it is felt that all pathology have been satisfactorily addressed.  The instruments removed.  Portals closed with 3-0 nylon suture.  Sterile dressings and a sling applied and the patient awakened and taken to recovery room in stable condition.  FOLLOWUP CARE:  Mr. Hoeffner will be followed as an outpatient on Percocet and Valium.  He will be seen back in the office in a week for sutures out and followup.     Cheskel Silverio A. Thurston Hole, M.D.     RAW/MEDQ  D:  05/26/2012  T:  05/26/2012  Job:  [id number]

## 2012-06-04 ENCOUNTER — Ambulatory Visit (INDEPENDENT_AMBULATORY_CARE_PROVIDER_SITE_OTHER): Payer: Self-pay | Admitting: Family Medicine

## 2012-06-04 DIAGNOSIS — E119 Type 2 diabetes mellitus without complications: Secondary | ICD-10-CM

## 2012-06-04 NOTE — Progress Notes (Signed)
Patient presents today for 3 month DM follow-up as part of the employer-sponsored Link to Wellness program. Medications and glucose readings have been reviewed. I have also discussed with patient lifestyle interventions such as healthy eating choices and physical activity. Details of this visit can be found in [facility] documenting program available through [facility] ([facility]). Patient has set a series of personal goals and will follow-up in 3 months for further review of DM.  

## 2012-06-14 ENCOUNTER — Encounter: Payer: Self-pay | Admitting: Family Medicine

## 2012-06-14 NOTE — Progress Notes (Signed)
Patient ID: Dale Anderson, male   DOB: 18-Nov-1929, 78 y.o.   MRN: [id number] Reviewed: Agree with documentation and management.

## 2012-06-15 ENCOUNTER — Other Ambulatory Visit: Payer: Self-pay | Admitting: Pulmonary Disease

## 2012-09-03 ENCOUNTER — Ambulatory Visit (INDEPENDENT_AMBULATORY_CARE_PROVIDER_SITE_OTHER): Payer: Self-pay | Admitting: Family Medicine

## 2012-09-03 DIAGNOSIS — E119 Type 2 diabetes mellitus without complications: Secondary | ICD-10-CM

## 2012-09-03 NOTE — Progress Notes (Signed)
Patient presents today for 3 month DM follow-up as part of the employer-sponsored Link to Wellness program. He states that he has been doing well since his last appointment with me in December. He had rotator cuff surgery on his right arm in December. He attends physical therapy once or twice a week. New medications- he is taking alprazolam and oxycodone prior to physical therapy. He feels like he has come a long way but still does not have full range of motion with that arm. His last appointment with Dr. Kriste Basque was in October 2013.   Physical Activity- Since he had surgery on his arm, most of his physical activity is the prescribed exercises from the physical therapist. He hasn't been using the treadmill as much as he was previously. 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 Since the weather has been bad he hasn't been walking outside as much.   Nutrition- 24 hour recall B- gravy biscuit at the hospital cafeteria L- bowl of fruit  D- [employer] hamburger, rice pudding (states that he doesn't eat this very often) He states that this is a typical day for him, except that he doesn't always get fast food. He acknowledges that he didn't eat much yesterday. He feels like he is getting enough to eat.  He avoids sweet snacks. When he goes to the [employer] in [place] he will sometimes get dessert. Drinking water and milk.   Disease Assessments:  Diabetes: Type of Diabetes: Type 2; Year of diagnosis 2007; Sees Diabetes Chrissie Dacquisto 1 time a year; MD managing Diabetes Dr. Alroy Dust; checks feet daily; checks blood glucose 2-6 times a week; uses glucometer; takes medications as prescribed; takes an aspirin a day; hypoglycemia frequency never; Highest CBG 138; Lowest CBG 78; Other Diabetes History: He is still checking his blood sugar three times a week. Usually this is a fasting check. He did not bring his blood glucose meter with him.   Patient reported- highest CBG- 138 (this is  after eating pie; he states that this will return to around 100 when he checks it again) lowest CBG- around 78.   Preventive Care:  Hemoglobin A1c: 09/03/12  Dilated Eye Exam: 07/25/2011  Flu vaccine: 03/23/2012  Foot Exam: 04/13/2012  Other Preventive Care Notes:   He states that he is due for an eye exam. His last dental exam was February 2014.   DM Assessment: A1C today was 6.6% (drop from 7.0% in September 2013). He is meeting glycemic goal of A1C less than 7%. It seems like patient has decreased the amount of food he is eating, and based on a 24 hour recall, he is not getting a variety of foods in his diet. He states he feels like he is getting enough to eat. I encouraged him to eat a variety of foods, including vegetables.   Physical activity has decreased following his surgery and also secondary to weather concerns. Encouraged patient to restart physical activity and return to walking outside and on the treadmill, with a goal of 150 mintues weekly.   Will fax A1C results to Dr. Jodelle Green office. Follow up with patient in 3 months.   Goals for Next Visit-  Great job with lowering your A1C!  1. As the weather improves, start walking again and also start riding your bicycle. Aim for 3-4 days a week for 60 minutes. Goal is to get 150 minutes of physical activity each week.  2. Continue making healthy eating choices and get a  variety of foods, including vegetables.   Next appointment is Friday June 20th at 8:30 AM.  Ellender Hose. Vivia Ewing, PharmD, BCPS

## 2012-09-21 NOTE — Progress Notes (Signed)
Patient ID: Dale Anderson, male   DOB: 1929-08-17, 79 y.o.   MRN: [id number] ATTENDING PHYSICIAN NOTE: I have reviewed the chart and agree with the plan as detailed above. Denny Levy MD Pager [phone]

## 2012-11-11 ENCOUNTER — Telehealth: Payer: Self-pay | Admitting: Pulmonary Disease

## 2012-11-11 ENCOUNTER — Other Ambulatory Visit: Payer: Self-pay | Admitting: Pulmonary Disease

## 2012-11-11 NOTE — Telephone Encounter (Signed)
lmntcb

## 2012-11-12 ENCOUNTER — Telehealth: Payer: Self-pay | Admitting: Pulmonary Disease

## 2012-11-12 ENCOUNTER — Other Ambulatory Visit: Payer: Self-pay | Admitting: Pulmonary Disease

## 2012-11-12 MED ORDER — PANTOPRAZOLE SODIUM 40 MG PO TBEC: 40.0000 mg | DELAYED_RELEASE_TABLET | Freq: Every day | ORAL | Status: DC

## 2012-11-12 MED ORDER — ASPIRIN 81 MG PO TBEC: DELAYED_RELEASE_TABLET | ORAL | Status: DC

## 2012-11-12 MED ORDER — ALPRAZOLAM 0.5 MG PO TABS: ORAL_TABLET | ORAL | Status: DC

## 2012-11-12 NOTE — Telephone Encounter (Signed)
Pt came to office to check on his refills he can be reached at [phone] and [phone].Dale Anderson

## 2012-11-12 NOTE — Telephone Encounter (Signed)
LMOM TCB x2 Only cell and work number on file for pt

## 2012-11-12 NOTE — Telephone Encounter (Signed)
Called spoke with patient, verified that he is needing refills on the BASA, protonix 40mg , xanax 0.5mg  Last ov 10.22.13, follow up in 1 yr >> scheduled for 10.23.14 for yearly follow up Per Leigh, ok to fill the xanax as before Pt stated no call back needed [facility], spoke with pharmacist Thayer Ohm and gave verbal order for the: Aspirin 81mg  1 po QD  #90 with 3 refills Protonix 40mg  1 po QD #90 with 3 refills Alprazolam 0.5mg  1/2 - 1 po TID prn #90 with 5 refills  Med list updated Nothing further needed; will sign off

## 2012-11-12 NOTE — Telephone Encounter (Signed)
Error.Dale Anderson ° °

## 2013-01-14 ENCOUNTER — Ambulatory Visit (INDEPENDENT_AMBULATORY_CARE_PROVIDER_SITE_OTHER): Payer: Self-pay | Admitting: Family Medicine

## 2013-01-14 VITALS — BP 154/67 | HR 66 | Ht 71.0 in | Wt 171.0 lb

## 2013-01-14 DIAGNOSIS — E119 Type 2 diabetes mellitus without complications: Secondary | ICD-10-CM

## 2013-01-14 NOTE — Progress Notes (Signed)
Subjective:  Patient presents today for 3 month diabetes follow-up as part of the employer-sponsored Link to Wellness program. Current diabetes regimen includes metformin 500 mg twice daily. Patient also continues on daily ASA, ACEi, and statin. Most recent MD follow-up was several months ago. Patient has a pending appt for October with Dr. Kriste Basque. No med changes or major health changes at this time.   Patient has gained approximately 10 pounds since his last appointment with me. He attributes this to decreased physical activity and an increased appetite. He has been travelling frequently over the past few weeks and he admits to eating more.    Disease Assessments:  Diabetes:  Type of Diabetes: Type 2; Year of diagnosis 2007; Sees Diabetes provider 1 time a year; MD managing Diabetes Dr. Alroy Dust; checks feet daily; checks blood glucose 2-6 times a week; uses glucometer; takes medications as prescribed; takes an aspirin a day; hypoglycemia frequency never;   Highest CBG 133; Lowest CBG 89;   Other Diabetes History: He states that usually he is checking his CBG a handful of times each week. Since he has returned from vacation he has been checking his blood sugar every morning because it has been elevated. Before going on vacation he was averaging 100 fasting; since he has returned he is averaging 115-133. He states that he is worried that his blood sugar has been elevated.   Patient did not bring meter with him to this appointment.      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 Previously he was walking on the treadmill.   Nutrition- He states that he has been eating more lately because of the travelling and the parties he has attended.     Learning Preference Assessment: Learner: Patient Readiness to Learn Barriers: None Teaching Method: Explanation Evaluation of Learning: Can function  independently and verbalize knowledge  Preventive Care:    Hemoglobin A1c: 09/03/12    Dilated Eye Exam: 07/25/2011  Flu vaccine: 03/23/2012  Foot Exam: 04/13/2012  Other Preventive Care Notes:  He states that he is due for an eye exam. His last dental exam was February 2014.       Vital Signs:  01/14/2013 10:31 AM (EST) BMI 23.8; Height 5 ft 11 in; Weight 171 lbs    Testing:  Blood Sugar Tests:  Hemoglobin A1c: 7.1    Assessment/Plan: Patient is an 79 year old male with DM2. A1C today was 7.1%. This is higher than his previous reading of 6.6% at his last appointment with me, and is higher than his goal of less than 7%. This increase in A1C is likely secondary to weight gain and patient increasing portion sizes.   Patient states that he knows what he needs to do to get back on track. He agreed to start physical activity again and to cut back on portion sizes.   I will fax A1C reading to Dr. Jodelle Green office.   Follow up with patient in 3 months..    Goals for Next Visit-  1. Make an appointment to get your annual eye appointment.  2. Cut back on portion sizes, especially when you are out at a party. Get back to the way you were eating before.  3. Start exercising again. Aim for 3-4 times a week on the treadmill for 30-45 minutes.   Next appointment with me is Monday October 27th at 8:30 AM.

## 2013-01-25 NOTE — Progress Notes (Signed)
Patient ID: Dale Anderson, male   DOB: 1930/06/15, 79 y.o.   MRN: [id number] ATTENDING PHYSICIAN NOTE: I have reviewed the chart and agree with the plan as detailed above. Denny Levy MD Pager [phone]

## 2013-02-08 ENCOUNTER — Telehealth: Payer: Self-pay | Admitting: Pulmonary Disease

## 2013-02-08 NOTE — Telephone Encounter (Signed)
I spoke with spouse. Since TP if off tomorrow SN had opening at 3 PM. He is scheduled to come in at that time. Nothing further needed

## 2013-02-09 ENCOUNTER — Encounter: Payer: Self-pay | Admitting: Pulmonary Disease

## 2013-02-09 ENCOUNTER — Ambulatory Visit (INDEPENDENT_AMBULATORY_CARE_PROVIDER_SITE_OTHER): Payer: 59 | Admitting: Pulmonary Disease

## 2013-02-09 VITALS — BP 120/70 | HR 66 | Temp 98.0°F | Ht 71.0 in | Wt 166.6 lb

## 2013-02-09 DIAGNOSIS — R21 Rash and other nonspecific skin eruption: Secondary | ICD-10-CM

## 2013-02-09 DIAGNOSIS — K219 Gastro-esophageal reflux disease without esophagitis: Secondary | ICD-10-CM

## 2013-02-09 DIAGNOSIS — E785 Hyperlipidemia, unspecified: Secondary | ICD-10-CM

## 2013-02-09 DIAGNOSIS — M1990 Unspecified osteoarthritis, unspecified site: Secondary | ICD-10-CM

## 2013-02-09 DIAGNOSIS — N402 Nodular prostate without lower urinary tract symptoms: Secondary | ICD-10-CM

## 2013-02-09 DIAGNOSIS — F411 Generalized anxiety disorder: Secondary | ICD-10-CM

## 2013-02-09 DIAGNOSIS — E119 Type 2 diabetes mellitus without complications: Secondary | ICD-10-CM

## 2013-02-09 DIAGNOSIS — F419 Anxiety disorder, unspecified: Secondary | ICD-10-CM

## 2013-02-09 DIAGNOSIS — I10 Essential (primary) hypertension: Secondary | ICD-10-CM

## 2013-02-09 MED ORDER — CLOTRIMAZOLE-BETAMETHASONE 1-0.05 % EX CREA: TOPICAL_CREAM | CUTANEOUS | Status: DC

## 2013-02-09 NOTE — Patient Instructions (Addendum)
Today we updated your med list in our EPIC system...    Continue your current medications the same...    We refilled your meds today...  For the rash in your groin>>    Try the new Lotrisone cream once or twice daily..  Please ret to our lab in the AM for your FASTING blood work...    We will contact you w/ the results when available...   Call for any questions...  Let's plan a follow up visit in 65mo, sooner if needed for problems.Marland KitchenMarland Kitchen

## 2013-02-09 NOTE — Progress Notes (Signed)
Subjective:    Patient ID: Dale Anderson, male    DOB: 05-Dec-1929, 79 y.o.   MRN: [id number]  HPI 79 y/o BM here for a follow up visit... he has multiple medical problems as noted below...   ~  March 01, 2010:  2 yr ROV- doing well he says w/o new complaints or concerns... 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 denies CP, palpit, SOB, cough, phlegm, GI or GU symptoms... he states that BP, Chol, BS, etc are all doing well... 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  ~  April 13, 2012:  Yearly ROV & [place] tells me that DrWainer is planning rotator cuff surg 12/13... His CC is anxiety & jitteriness that keeps him from sleeping, requests Rx for Xanax to take at night for sleep- OK...     HxBronchitis> no recent exac & he does not require regular meds...    HBP> on Lisin10mg /d w/ good control & BP= 132/62 today; he denies HA, CP, palpit, SOB, edema, etc; still works full time security at [employer] no prob w/ walking, stairs, etc...    Others> he remains on Lip20& his FLP 9/13 showed TChol 128, TG 40, HDL 44, LDL 76;  Still on DM diet + Metform500Bid w/ BS=110, A1c=7.0;  He remains on Nexium for the GERD & Flomax for his Prostate (see below)...    We reviewed prob list, meds, xrays and labs> see below for updates >> he had the Flu shot recently & requests Rx for Shingles vaccine.  ~  February 09, 2013:  67mo ROV & Samul is still working full time security at [employer]  a real inspiration to all;  His CC= rash in groin that he wonders may  be from tearing down a fence &using chlorox but more likely an intertriginous rash related to his DM & exam shows mild inguinal candida rash- we discussed Rx w/ Lotrisone cream;  He has been seeing Dr Denny Levy in [facility] as part of their employee sponsored "Link to Wellness" program & doing satis... We reviewed the following medical problems during today's office visit >>     Hx Bronchitis> never smoked, no recent bronchial infection, he denies cough, sput, hemoptysis, SOB, etc...    HBP> on Lisinopril10;  BP= 120/70 & he denies CP, palpit, SOB, dizzy, edema, etc...     Hyperlipidemia> on Lip20;  Labs 8/14 showed TChol 119, TG 42, HDL 47, LDL 64    DM> on Metform500Bid;  Labs show BS= 93, A1c= 7.1; Rec- continue same...    GERD, colon polyp> on Protonix40- takes it daily & asymptomatic w/o abd pain, n/v, c/d, blood seen; last colon was 12/12 by DrKaplan- mild divertics & 1-42mm hyperplastic polyps...    BPH> on Tamsulosin0.4 per [facility] but he only takes it prn voiding problems; states he's been voiding satis recently; PSA=     DJD> he had extensive right shoulder surg 12/13 by DrWainer- much improved after  PT etc & using OTC analgesics as needed now...    Anxiety> on Alpraz0.5 prn which really helps he says... We reviewed prob list, meds, xrays and labs> see below for updates >>  LABS 8/14:  FLP- at goals on Lip20;  Chems- wnl w/ BS=93 A1c=7.1 Cr=1.3;  CBC- wnl;  TSH=2.25;  PSA=2.02...            Problem List:  Hx of BRONCHITIS, RECURRENT (ICD-491.9) - he is a never smoker... no recent bronchitic symptoms... ~  CXR 6/09 showed hyperaeration, clear, NAD- DJD in spine. ~  CXR 9/11> pt forgot to go to basement for film (too busy & had to get back to work).  HYPERTENSION (ICD-401.9) - on LISINOPRIL 10mg /d...  ~  10/12:  BP= 116/68 and doing well... denies HA, fatigue, visual changes, CP, palipit, dizziness, syncope, dyspnea, edema, etc... ~  10/13:  BP= 1342/62 & he remains largely  asymptomatic... ~  8/14:  BP= 120/70 on Lisin10 & pt remains asymptomatic w/o CP, palpit, SOB, dizzy, edema, etc...  HYPERLIPIDEMIA (ICD-272.4) - on LIPITOR 20mg /d... tolerating well... ~  FLP 7/07 showed TChol 114, TG 47, HDL 41, LDL 64 ~  FLP 5/08 showed TChol 107, TG 39, HDL 37, LDL 63 ~  FLP 6/09 showed TChol 130, TG 46, HDL 52, LDL 69 ~  FLP 1/11 showed TChol 160, TG 141, HDL 40, LDL 92 ~  FLP 9/11 showed TChol 123, TG 66, HDL 37, LDL 73 ~  FLP 7/12 showed TChol 133, TG 89, HDL 38, LDL 77 ~  FLP 9/13 on Lip20 showed TChol 128, TG 40, HDL 44, LDL 76  ~  FLP 8/14 on Lip20 showed TChol 119, TG 42, HDL 47, LDL 64  DIABETES MELLITUS (ICD-250.00) - on METFORMIN 500mg Bid...  ~  labs 6/06 showed BS= 126, HgA1c= 6.1 ~  labs 5/08 showed BS= 121, HgA1c= 6.8 ~  labs 6/09 showed BS= 96, A1c= 6.3 ~  labs 1/11 showed BS= 111, A1c= 7.2 ~  labs 9/11 showed BS= 94, A1c= 7.5.Marland KitchenMarland Kitchen needs better diet, get wt down, or incr meds! ~  Labs 7/12 showed BS= 139, A1c= 7.9 ~  Labs 10/12 showed A1c= 7.2... Ok to continue Metform monotherapy but needs better diet. ~  Labs 9/13 on Metform500Bid showed BS=110, A1c=7.0, continue same... ~  Labs 8/14 on Metform500Bid showed BS= 93, A1c= 7.1, continue same...  GERD (ICD-530.81) - prev Prilosec OTC changed to NEXIUM 40mg /d ("it's free at hosp pharm"). ~  Once again changed to PROTONIX 40mg /d based on hosp formulary; he remains asymptomatic on the PPI...  Hx of HEMORRHOIDS (ICD-455.6) - last colonoscopy 6/98 by Dorris Singh was a normal study and f/u suggested in 10 yrs & he is overdue> refer to GI. ~  12/12: he had f/u colonoscopy by DrKaplan w/ mild divertics & 1-64mm diminutive polyp in sigmoid= hyperplastic...  Hx of NODULAR PROSTATE WITHOUT URINARY OBSTRUCTION (ICD-600.10) - hx of nodular prostate w/ neg biopsies by DrNesi in 1999...  ~  9/11: he reports yearly f/u DrNesi due next week, ?on FLOMAX 0.4mg /d- but he only uses it Prn... ~  10/12:  He reports recent f/u  DrNesi was "good" but we don't have records... ~  10/13:  He tells me recent check up by [facility] was good but we still don't have their records... ~  8/14:  He notes voiding satis & only uses the Flomax0.4 as needed; Labs showed PSA= 2.02  DEGENERATIVE JOINT DISEASE (ICD-715.90) - he uses OTC anti-inflamm meds Prn. ~  s/p left rotator cuff repair 1995 by DrPresson... ~  hx of distal left radius fracture in 2000 set by DrDuda... ~  eval for back pain by DrBrooks 8/08... rec phys therapy & improved... ~  Right rotator cuff tear & DrWainer did extensive right shoulder surg 12/13, pt reports much improved...   ANXIETY (ICD-300.00) - he requests ALPRAZOLAM 0.5mg - 1/2 to 1 tab Tid as needed...  SHINGLES, HX OF (ICD-V13.8) - hx of right L1 shingles in 2004... also hx of seb dermatitis eval at [facility] in 2002... Rx written for vaccine at local pharm...  HEALTH MAINTENANCE: ~  GI:  Followed by Dorris Singh & he is overdue for f/u colonoscopy... ~  GU:  Followed by [facility] w/ PSAs but we don't have any recent notes... ~  Immuniz:  He gets the seasonal flu vaccine yearly at [facility]; Employee health should be keeping up w/ his vaccinations, he says...   Past Surgical History  Procedure Laterality Date  . Rotator cuff repair  1995    Left.  Dr. Meade Maw  . Inguinal hernia repair  2006    Right.  Dr. Lurene Shadow  . Colonoscopy  1998  . Hand reconstruction      right  . Shoulder arthroscopy with rotator cuff repair and subacromial decompression  05/25/2012    Procedure: SHOULDER ARTHROSCOPY WITH ROTATOR CUFF REPAIR AND SUBACROMIAL DECOMPRESSION;  Surgeon: Nilda Simmer, MD;  Location: [facility];  Service: Orthopedics;  Laterality: Right;  Right Shoulder Arthroscopy shoulder decompression subacromial partial acromioplasty with coracoaromial release, arthroscopy shoulder distal claviculectomy, arthroscopy shoulder with rotator cuff repai    Outpatient Encounter Prescriptions as of 02/09/2013   Medication Sig Dispense Refill  . ALPRAZolam (XANAX) 0.5 MG tablet Take 1/2 to 1 tablet by mouth three times daily as needed for nerves  90 tablet  5  . aspirin (ASPIRIN LOW DOSE) 81 MG EC tablet TAKE 1 TABLET BY MOUTH EVERY DAY  90 tablet  3  . atorvastatin (LIPITOR) 20 MG tablet TAKE 1 TABLET BY MOUTH DAILY.  90 tablet  3  . Cholecalciferol (VITAMIN D) 1000 UNITS capsule Take 1,000 Units by mouth daily.        . fluocinonide (LIDEX) 0.05 % external solution APPLY TO THE AFFECTED AREA AS DIRECTED  60 mL  5  . lisinopril (PRINIVIL,ZESTRIL) 10 MG tablet Take 10 mg by mouth daily.      . metFORMIN (GLUCOPHAGE) 500 MG tablet TAKE 1 TABLET BY MOUTH 2 TIMES DAILY.  180 tablet  3  . Multiple Vitamin (MULTIVITAMIN) capsule Take 1 capsule by mouth daily.        . pantoprazole (PROTONIX) 40 MG tablet Take 1 tablet (40 mg total) by mouth daily.  90 tablet  3  . TRUETRACK TEST test strip TEST BLOOD SUGAR ONCE DAILY  1 each  3  . clotrimazole-betamethasone (LOTRISONE) cream Apply to rash once or twice daily  30 g  6   No facility-administered encounter medications on file as of 02/09/2013.    No Known Allergies   Current Medications, Allergies, Past Medical History, Past Surgical History, Family History, and Social History were reviewed in [employer] record.    Review of Systems        The patient complains of nocturia and arthritis.  The patient denies fever, chills, sweats, anorexia, fatigue, weakness, malaise, weight loss, sleep disorder, blurring, diplopia, eye irritation, eye discharge, vision loss, eye pain, photophobia, earache, ear discharge, tinnitus, decreased hearing, nasal congestion,  nosebleeds, sore throat, hoarseness, chest pain, palpitations, syncope, dyspnea on exertion, orthopnea, PND, peripheral edema, cough, dyspnea at rest, excessive sputum, hemoptysis, wheezing, pleurisy, nausea, vomiting, diarrhea, constipation, change in bowel habits, abdominal pain,  melena, hematochezia, jaundice, gas/bloating, indigestion/heartburn, dysphagia, odynophagia, dysuria, hematuria, urinary frequency, urinary hesitancy, incontinence, back pain, joint pain, joint swelling, muscle cramps, muscle weakness, stiffness, sciatica, restless legs, leg pain at night, leg pain with exertion, rash, itching, dryness, suspicious lesions, paralysis, paresthesias, seizures, tremors, vertigo, transient blindness, frequent falls, frequent headaches, difficulty walking, depression, anxiety, memory loss, confusion, cold intolerance, heat intolerance, polydipsia, polyphagia, polyuria, unusual weight change, abnormal bruising, bleeding, enlarged lymph nodes, urticaria, allergic rash, hay fever, and recurrent infections.     Objective:   Physical Exam     WD, WN, 79 y/o BM in NAD... GENERAL:  Alert & oriented; [place] & cooperative... HEENT:  [place]/AT, EOM-wnl, PERRLA, EACs-clear, TMs-wnl, NOSE-clear, THROAT-clear & wnl. NECK:  Supple w/ fairROM; no JVD; normal carotid impulses w/o bruits; no thyromegaly or nodules palpated; no lymphadenopathy. CHEST:  Clear to P & A; without wheezes/ rales/ or rhonchi. HEART:  Regular Rhythm; without murmurs/ rubs/ or gallops. ABDOMEN:  Soft & nontender; normal bowel sounds; no organomegaly or masses detected. EXT: without deformities, mild arthritic changes; decr ROM shoulders; no varicose veins/ venous insuffic/ or edema. NEURO:  CN's intact; no focal neuro deficits... DERM:  No lesions noted; no rash etc...  RADIOLOGY DATA:  Reviewed in the EPIC EMR & discussed w/ the patient...  LABORATORY DATA:  Reviewed in the EPIC EMR & discussed w/ the patient...   Assessment & Plan:    HBP>  Controlled on Lisinopril + diet;  Continue same...  HYPERLIPID>  FLP looks good on Lip20 + diet;  Continue same...  DM>  On Metformin monotherapy + diet; his A1c is stable at 7.1 & we can hold off on new meds for now; admonished to stay on diet + exercise,  etc...  GERD>  Stable on PPI Rx  Hx Hems, Divertics, Polyp>  DrKaplan did f/u colon 12/12 w/ divertics & diminutive hyperplastic polyp removed...  GU> nodular prostate, neg bx, BPH>  Followed by Thermon Leyland on Tamsulosin prn & pt reports all neg- doing well...  DJD>  Aware; he uses OTC analgesics, going satis after drWainers last surg 12/13...  Other medical problems as noted....   Patient's Medications  New Prescriptions   CLOTRIMAZOLE-BETAMETHASONE (LOTRISONE) CREAM    Apply to rash once or twice daily  Previous Medications   ALPRAZOLAM (XANAX) 0.5 MG TABLET    Take 1/2 to 1 tablet by mouth three times daily as needed for nerves   ASPIRIN (ASPIRIN LOW DOSE) 81 MG EC TABLET    TAKE 1 TABLET BY MOUTH EVERY DAY   ATORVASTATIN (LIPITOR) 20 MG TABLET    TAKE 1 TABLET BY MOUTH DAILY.   CHOLECALCIFEROL (VITAMIN D) 1000 UNITS CAPSULE    Take 1,000 Units by mouth daily.     FLUOCINONIDE (LIDEX) 0.05 % EXTERNAL SOLUTION    APPLY TO THE AFFECTED AREA AS DIRECTED   LISINOPRIL (PRINIVIL,ZESTRIL) 10 MG TABLET    Take 10 mg by mouth daily.   METFORMIN (GLUCOPHAGE) 500 MG TABLET    TAKE 1 TABLET BY MOUTH 2 TIMES DAILY.   MULTIPLE VITAMIN (MULTIVITAMIN) CAPSULE    Take 1 capsule by mouth daily.     PANTOPRAZOLE (PROTONIX) 40 MG TABLET    Take 1 tablet (40 mg total) by mouth daily.   TRUETRACK TEST TEST STRIP  TEST BLOOD SUGAR ONCE DAILY  Modified Medications   No medications on file  Discontinued Medications   No medications on file

## 2013-02-10 ENCOUNTER — Other Ambulatory Visit (INDEPENDENT_AMBULATORY_CARE_PROVIDER_SITE_OTHER): Payer: 59

## 2013-02-10 DIAGNOSIS — E119 Type 2 diabetes mellitus without complications: Secondary | ICD-10-CM

## 2013-02-10 DIAGNOSIS — N402 Nodular prostate without lower urinary tract symptoms: Secondary | ICD-10-CM

## 2013-02-10 DIAGNOSIS — E785 Hyperlipidemia, unspecified: Secondary | ICD-10-CM

## 2013-02-10 DIAGNOSIS — K219 Gastro-esophageal reflux disease without esophagitis: Secondary | ICD-10-CM

## 2013-02-10 DIAGNOSIS — I10 Essential (primary) hypertension: Secondary | ICD-10-CM

## 2013-02-10 DIAGNOSIS — F419 Anxiety disorder, unspecified: Secondary | ICD-10-CM

## 2013-02-10 DIAGNOSIS — F411 Generalized anxiety disorder: Secondary | ICD-10-CM

## 2013-02-10 LAB — CBC WITH DIFFERENTIAL/PLATELET
Basophils Absolute: 0 10*3/uL (ref 0.0–0.1)
Basophils Relative: 0.4 % (ref 0.0–3.0)
Eosinophils Relative: 3.4 % (ref 0.0–5.0)
HCT: 38.2 % — ABNORMAL LOW (ref 39.0–52.0)
Hemoglobin: 13 g/dL (ref 13.0–17.0)
Lymphocytes Relative: 31.6 % (ref 12.0–46.0)
Monocytes Relative: 8.8 % (ref 3.0–12.0)
Neutro Abs: 2.9 10*3/uL (ref 1.4–7.7)
RBC: 4.14 Mil/uL — ABNORMAL LOW (ref 4.22–5.81)
RDW: 13.6 % (ref 11.5–14.6)
WBC: 5.2 10*3/uL (ref 4.5–10.5)

## 2013-02-10 LAB — LIPID PANEL
HDL: 47 mg/dL (ref >39.00)
Triglycerides: 42 mg/dL (ref 0.0–149.0)
VLDL: 8.4 mg/dL (ref 0.0–40.0)

## 2013-02-10 LAB — BASIC METABOLIC PANEL
CO2: 26 mEq/L (ref 19–32)
Calcium: 9.5 mg/dL (ref 8.4–10.5)
Creatinine, Ser: 1.3 mg/dL (ref 0.4–1.5)
GFR: 70.68 mL/min (ref >60.00)
Glucose, Bld: 93 mg/dL (ref 70–99)

## 2013-02-10 LAB — HEPATIC FUNCTION PANEL
Albumin: 4.2 g/dL (ref 3.5–5.2)
Total Protein: 7.4 g/dL (ref 6.0–8.3)

## 2013-02-10 LAB — PSA: PSA: 2.02 ng/mL (ref 0.10–4.00)

## 2013-04-11 ENCOUNTER — Ambulatory Visit (INDEPENDENT_AMBULATORY_CARE_PROVIDER_SITE_OTHER): Payer: Self-pay | Admitting: Family Medicine

## 2013-04-11 VITALS — BP 151/73 | HR 93 | Ht 71.0 in | Wt 160.4 lb

## 2013-04-11 DIAGNOSIS — E119 Type 2 diabetes mellitus without complications: Secondary | ICD-10-CM

## 2013-04-11 NOTE — Progress Notes (Signed)
Subjective:  Patient presents today for 3 month diabetes follow-up as part of the employer-sponsored Link to Wellness program. Current diabetes regimen includes metformin 500 mg BID. Patient also continues on daily ASA, ACEi, and statin.   Patient reports that he is doing well, except for a mild cold for the past few days. BP is elevated today- he was cutting the grass immediately prior to coming to his appointment and he has not taken his BP medication yet today.   No medication changes since his last appointment with me. His physical was in August with Dr. Kriste Basque. His last A1C was 7.1%.    Disease Assessments:  Diabetes:  Type of Diabetes: Type 2; Year of diagnosis 2007; Sees Diabetes provider 1 time a year; MD managing Diabetes Dr. Alroy Dust; checks feet daily; checks blood glucose 2-6 times a week; uses glucometer; takes medications as prescribed; takes an aspirin a day; hypoglycemia frequency never;   Highest CBG 126; Lowest CBG 89; Other Diabetes History: Patient did not bring meter with him to this appointment. He states that he is checking his blood sugar a few times a week.   He reports that it is usually around 100. He is checking a fasting reading. He states that when he eats sweets (pie) it is elevated. Highs and lows are patient reported. He denies hypoglycemia.   Physical Activity-   He is using the treadmill four times a week for 35-40 minutes. 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   Nutrition- He states he is eating less than he was at his last appointment with me. His weight is down about 10 pounds since July, but his weight in July was increased about 6-7 pounds following a cruise and vacation.     Hemoglobin A1c: 02/10/2013 7.1    Dilated Eye Exam: 03/28/2013  Flu vaccine: 03/23/2012  Foot Exam: 03/16/2013  Other Preventive Care Notes: Dental Visit- he went in August 2014.       Vital Signs:  04/11/2013 8:57 AM (EST) Blood  Pressure 151 / 73 mm/HgBMI 22.4; Height 5 ft 11 in; Pulse Rate 93 bpm; Weight 160.4 lbs    Testing:  Blood Sugar Tests:  Hemoglobin A1c: 7.1 resulted on 02/10/2013    Objective:  Musculoskeletal: feet and toes normal.    Assessment/Plan: Patient is a 79 year old male with DM2. Last A1C was 7.1% and is slightly above goal of less than 7%. Weight has dropped to 160 pounds today, down from 171 pounds at his last appointment with me. Based on self reported blood sugars patient appears to be doing well. He has started physical activity again and is meeting goal of at least 150 minutes of physical activity each week. He has dropped back on his portion sizes from what he was eating at his last appointment with me. I encouraged him to maintain his weight and to not attempt to lose more.   I showed him how to log in to the new wellness site for employees and encouraged him to claim his wellness rewards.   Follow up with patient in 3 months..    Goals for Next Visit-  1. Continue making healthy eating choices. Try to maintain the weight you are now.  2. Continue physical activity- aim for 3-4 times a week.  3. Weigh yourself a few times a week. 4. Log in to the new wellness website (you can get there through [facility]) and claim  your wellness rewards.  Next appointment to see me is Monday January 19th at 8:30 AM.

## 2013-04-14 ENCOUNTER — Ambulatory Visit: Payer: 59 | Admitting: Pulmonary Disease

## 2013-05-04 LAB — HM DIABETES EYE EXAM

## 2013-05-12 ENCOUNTER — Other Ambulatory Visit: Payer: Self-pay | Admitting: *Deleted

## 2013-05-12 MED ORDER — LISINOPRIL 10 MG PO TABS: 10.0000 mg | ORAL_TABLET | Freq: Every day | ORAL | Status: DC

## 2013-05-12 MED ORDER — ATORVASTATIN CALCIUM 20 MG PO TABS: ORAL_TABLET | ORAL | Status: DC

## 2013-05-12 MED ORDER — METFORMIN HCL 500 MG PO TABS: ORAL_TABLET | ORAL | Status: DC

## 2013-07-12 ENCOUNTER — Other Ambulatory Visit: Payer: Self-pay | Admitting: Pulmonary Disease

## 2013-08-10 ENCOUNTER — Ambulatory Visit (INDEPENDENT_AMBULATORY_CARE_PROVIDER_SITE_OTHER): Payer: Medicare Other | Admitting: Pulmonary Disease

## 2013-08-10 ENCOUNTER — Other Ambulatory Visit (INDEPENDENT_AMBULATORY_CARE_PROVIDER_SITE_OTHER): Payer: Medicare Other

## 2013-08-10 ENCOUNTER — Ambulatory Visit (INDEPENDENT_AMBULATORY_CARE_PROVIDER_SITE_OTHER)
Admission: RE | Admit: 2013-08-10 | Discharge: 2013-08-10 | Disposition: A | Discharge status: Home / Self Care | Payer: Medicare Other | Source: Ambulatory Visit | Attending: Pulmonary Disease | Admitting: Pulmonary Disease

## 2013-08-10 ENCOUNTER — Encounter: Payer: Self-pay | Admitting: Pulmonary Disease

## 2013-08-10 VITALS — BP 126/64 | HR 67 | Temp 96.9°F | Ht 71.0 in | Wt 168.4 lb

## 2013-08-10 DIAGNOSIS — E119 Type 2 diabetes mellitus without complications: Secondary | ICD-10-CM

## 2013-08-10 DIAGNOSIS — K219 Gastro-esophageal reflux disease without esophagitis: Secondary | ICD-10-CM

## 2013-08-10 DIAGNOSIS — F411 Generalized anxiety disorder: Secondary | ICD-10-CM

## 2013-08-10 DIAGNOSIS — N402 Nodular prostate without lower urinary tract symptoms: Secondary | ICD-10-CM

## 2013-08-10 DIAGNOSIS — I10 Essential (primary) hypertension: Secondary | ICD-10-CM

## 2013-08-10 DIAGNOSIS — F419 Anxiety disorder, unspecified: Secondary | ICD-10-CM

## 2013-08-10 DIAGNOSIS — E785 Hyperlipidemia, unspecified: Secondary | ICD-10-CM

## 2013-08-10 DIAGNOSIS — M1990 Unspecified osteoarthritis, unspecified site: Secondary | ICD-10-CM

## 2013-08-10 LAB — BASIC METABOLIC PANEL WITH GFR
BUN: 37 mg/dL — ABNORMAL HIGH (ref 6–23)
CO2: 25 meq/L (ref 19–32)
Calcium: 9.8 mg/dL (ref 8.4–10.5)
Chloride: 105 meq/L (ref 96–112)
Creatinine, Ser: 1.4 mg/dL (ref 0.4–1.5)
GFR: 64.64 mL/min
Glucose, Bld: 105 mg/dL — ABNORMAL HIGH (ref 70–99)
Potassium: 5.1 meq/L (ref 3.5–5.1)
Sodium: 136 meq/L (ref 135–145)

## 2013-08-10 LAB — HEMOGLOBIN A1C: HEMOGLOBIN A1C: 7.3 % — AB (ref 4.6–6.5)

## 2013-08-10 MED ORDER — GLUCOSE BLOOD VI STRP: ORAL_STRIP | Status: DC

## 2013-08-10 MED ORDER — FLUOCINONIDE 0.05 % EX SOLN: CUTANEOUS | Status: DC

## 2013-08-10 NOTE — Progress Notes (Signed)
Subjective:    Patient ID: Dale Anderson, male    DOB: 01-21-1930, 80 y.o.   MRN: [id number]  HPI 80 y/o BM here for a follow up visit... he has multiple medical problems as noted below...   ~  April 13, 2012:  Yearly ROV & [place] tells me that DrWainer is planning rotator cuff surg 12/13... His CC is anxiety & jitteriness that keeps him from sleeping, requests Rx for Xanax to take at night for sleep- OK...     HxBronchitis> no recent exac & he does not require regular meds...    HBP> on Lisin10mg /d w/ good control & BP= 132/62 today; he denies HA, CP, palpit, SOB, edema, etc; still works full time security at [employer] no prob w/ walking, stairs, etc...    Others> he remains on Lip20& his FLP 9/13 showed TChol 128, TG 40, HDL 44, LDL 76;  Still on DM diet + Metform500Bid w/ BS=110, A1c=7.0;  He remains on Nexium for the GERD & Flomax for his Prostate (see below)...    We reviewed prob list, meds, xrays and labs> see below for updates >> he had the Flu shot recently & requests Rx for Shingles vaccine.  ~  February 09, 2013:  70mo ROV & Keng is still working full time security at [employer] a real inspiration to all;  His CC= rash in groin that he wonders may be from tearing down a fence &using chlorox but more likely an intertriginous rash related to his DM & exam shows mild inguinal candida rash- we discussed Rx w/ Lotrisone cream;  He has been seeing Dr Dorcas Mcmurray in [place] as part of their employee sponsored "Link to Wellness" program & doing satis... We reviewed the following medical problems during today's office visit >>     Hx Bronchitis> never smoked, no recent bronchial infection, he denies cough, sput, hemoptysis, SOB, etc...    HBP> on Lisinopril10;  BP= 120/70 & he denies CP, palpit, SOB, dizzy, edema, etc...     Hyperlipidemia> on Lip20;  Labs 8/14 showed TChol 119, TG 42, HDL 47, LDL 64    DM> on Metform500Bid;  Labs show BS= 93, A1c= 7.1; Rec- continue same...    GERD,  colon polyp> on Protonix40- takes it daily & asymptomatic w/o abd pain, n/v, c/d, blood seen; last colon was 12/12 by DrKaplan- mild divertics & 1-5mm hyperplastic polyps...    BPH> on Tamsulosin0.4 per [facility] but he only takes it prn voiding problems; states he's been voiding satis recently; PSA=     DJD> he had extensive right shoulder surg 12/13 by DrWainer- much improved after PT etc & using OTC analgesics as needed now...    Anxiety> on Alpraz0.5 prn which really helps he says... We reviewed prob list, meds, xrays and labs> see below for updates >>  LABS 8/14:  FLP- at goals on Lip20;  Chems- wnl w/ BS=93 A1c=7.1 Cr=1.3;  CBC- wnl;  TSH=2.25;  PSA=2.02...   ~  August 10, 2013:  73mo ROV & Dale Anderson is doing well- "I feel good every day" he says, still working full time in [employer]; his CC is "GAS" & he thinks it may be related to milk, we discussed lactose intol & asked him to switch for 81mo to SoyMilk & Rx w/ Mylicon & Phazyme both Qid... He also had some heel pain & went to podiatry for eval w/ heel shots that have helped a lot, we reviewed stretching exercises... We reviewed the  following medical problems during today's office visit >>     Hx Bronchitis> never smoked, no recent bronchial infection, he denies cough, sput, hemoptysis, SOB, etc...    HBP> on Lisinopril10;  BP= 126/64 & he denies CP, palpit, SOB, dizzy, edema, etc...     Hyperlipidemia> on Lip20;  Labs 8/14 showed TChol 119, TG 42, HDL 47, LDL 64    DM> on Metform500Bid;  Labs today showed BS= , A1c= ; Rec- continue same...     GERD, colon polyp> on Protonix40- takes it daily & asymptomatic w/o abd pain, n/v, c/d, blood seen; last colon was 12/12 by DrKaplan- mild divertics & 1-26mm hyperplastic polyps...    BPH> on Tamsulosin0.4 per [facility] but he only takes it prn voiding problems; states he's been voiding satis recently; [id number] PSA= 2.02    DJD> he had extensive right shoulder surg 12/13 by DrWainer- much improved after PT etc &  using OTC analgesics as needed now...    Anxiety> on Alpraz0.5 prn which really helps he says... We reviewed prob list, meds, xrays and labs> see below for updates >>  CXR 2/15 showed norm heart size, calcif toracic Ao, mild peribronch thickening & prob nipple shadow at right base, mild DJD Tspine... LABS 2/15:  Chems- ok x BUN=37 Cr=1.4 BS=105 A1c=7.3           Problem List:  Hx of BRONCHITIS, RECURRENT (ICD-491.9) - he is a never smoker... no recent bronchitic symptoms... ~  CXR 6/09 showed hyperaeration, clear, NAD- DJD in spine. ~  CXR 9/11> pt forgot to go to basement for film (too busy & had to get back to work). ~  CXR 2/15 showed norm heart size, calcif toracic Ao, mild peribronch thickening & prob nipple shadow at right base, mild DJD Tspine  HYPERTENSION (ICD-401.9) - on LISINOPRIL 10mg /d...  ~  10/12:  BP= 116/68 and doing well... denies HA, fatigue, visual changes, CP, palipit, dizziness, syncope, dyspnea, edema, etc... ~  10/13:  BP= 1342/62 & he remains largely asymptomatic... ~  8/14:  BP= 120/70 on Lisin10 & pt remains asymptomatic w/o CP, palpit, SOB, dizzy, edema, etc... ~  2/15:  on Lisinopril10;  BP= 126/64 & he denies CP, palpit, SOB, dizzy, edema, etc...  HYPERLIPIDEMIA (ICD-272.4) - on LIPITOR 20mg /d... tolerating well... ~  Gerrard 7/07 showed TChol 114, TG 47, HDL 41, LDL 64 ~  FLP 5/08 showed TChol 107, TG 39, HDL 37, LDL 63 ~  FLP 6/09 showed TChol 130, TG 46, HDL 52, LDL 69 ~  FLP 1/11 showed TChol 160, TG 141, HDL 40, LDL 92 ~  FLP 9/11 showed TChol 123, TG 66, HDL 37, LDL 73 ~  FLP 7/12 showed TChol 133, TG 89, HDL 38, LDL 77 ~  FLP 9/13 on Lip20 showed TChol 128, TG 40, HDL 44, LDL 76  ~  FLP 8/14 on Lip20 showed TChol 119, TG 42, HDL 47, LDL 64  DIABETES MELLITUS (ICD-250.00) - on METFORMIN 500mg Bid...  ~  labs 6/06 showed BS= 126, HgA1c= 6.1 ~  labs 5/08 showed BS= 121, HgA1c= 6.8 ~  labs 6/09 showed BS= 96, A1c= 6.3 ~  labs 1/11 showed BS= 111, A1c=  7.2 ~  labs 9/11 showed BS= 94, A1c= 7.5.Marland KitchenMarland Kitchen needs better diet, get wt down, or incr meds! ~  Labs 7/12 showed BS= 139, A1c= 7.9 ~  Labs 10/12 showed A1c= 7.2... Ok to continue Metform monotherapy but needs better diet. ~  Labs 9/13 on Metform500Bid showed BS=110, A1c=7.0,  continue same... ~  Labs 8/14 on Metform500Bid showed BS= 93, A1c= 7.1, continue same... ~  He is followed in the [place] [facility] Wellness program by DrSaraNeal & her note from 10/14 is reviewed... ~  Labs 2/15 on Metform500Bid showed BS=105 A1c=7.3  GERD (ICD-530.81) - prev Prilosec OTC changed to [place] 40mg /d ("it's free at [place]"). ~  Once again changed to [place] 40mg /d based on hosp formulary; he remains asymptomatic on the PPI...  Hx of HEMORRHOIDS (ICD-455.6) - last colonoscopy 6/98 by Demetra Shiner was a normal study and f/u suggested in 10 yrs & he is overdue> refer to GI. ~  12/12: he had f/u colonoscopy by DrKaplan w/ mild divertics & 1-65mm diminutive polyp in sigmoid= hyperplastic...  Hx of NODULAR PROSTATE WITHOUT URINARY OBSTRUCTION (ICD-600.10) - hx of nodular prostate w/ neg biopsies by DrNesi in 1999...  ~  9/11: he reports yearly f/u DrNesi due next week, ?on FLOMAX 0.4mg /d- but he only uses it Prn... ~  10/12:  He reports recent f/u DrNesi was "good" but we don't have records... ~  10/13:  He tells me recent check up by [facility] was good but we still don't have their records... ~  8/14:  He notes voiding satis & only uses the Flomax0.4 as needed; Labs showed PSA= 2.02 ~  He saw DrNesi 10/14> BPH & ED- voiding satis, uses Tamsulosin just as needed & says this helps, no change in meds...  DEGENERATIVE JOINT DISEASE (ICD-715.90) - he uses OTC anti-inflamm meds Prn. ~  s/p left rotator cuff repair 1995 by DrPresson... ~  hx of distal left radius fracture in 2000 set by DrDuda... ~  eval for back pain by DrBrooks 8/08... rec phys therapy & improved... ~  Right rotator cuff tear & DrWainer  did extensive right shoulder surg 12/13, pt reports much improved...   ANXIETY (ICD-300.00) - he requests ALPRAZOLAM 0.5mg - 1/2 to 1 tab Tid as needed...  SHINGLES, HX OF (ICD-V13.8) - hx of right L1 shingles in 2004... also hx of seb dermatitis eval at [facility] in 2002... Rx written for vaccine at local pharm...  HEALTH MAINTENANCE: ~  GI:  Followed by Demetra Shiner & he is overdue for f/u colonoscopy... ~  GU:  Followed by [facility] w/ PSAs but we don't have any recent notes... ~  Immuniz:  He gets the seasonal flu vaccine yearly at the [facility]; Employee health should be keeping up w/ his vaccinations, he says...   Past Surgical History  Procedure Laterality Date  . Rotator cuff repair  1995    Left.  Dr. Wonda Olds  . Inguinal hernia repair  2006    Right.  Dr. Bubba Camp  . Colonoscopy  1998  . Hand reconstruction      right  . Shoulder arthroscopy with rotator cuff repair and subacromial decompression  05/25/2012    Procedure: SHOULDER ARTHROSCOPY WITH ROTATOR CUFF REPAIR AND SUBACROMIAL DECOMPRESSION;  Surgeon: Lorn Junes, MD;  Location: [place];  Service: Orthopedics;  Laterality: Right;  Right Shoulder Arthroscopy shoulder decompression subacromial partial acromioplasty with coracoaromial release, arthroscopy shoulder distal claviculectomy, arthroscopy shoulder with rotator cuff repai    Outpatient Encounter Prescriptions as of 08/10/2013  Medication Sig  . ALPRAZolam (XANAX) 0.5 MG tablet Take 1/2 to 1 tablet by mouth three times daily as needed for nerves  . aspirin (ASPIRIN LOW DOSE) 81 MG EC tablet TAKE 1 TABLET BY MOUTH EVERY DAY  . atorvastatin (LIPITOR) 20 MG tablet TAKE 1  TABLET BY MOUTH DAILY.  Marland Kitchen Cholecalciferol (VITAMIN D) 1000 UNITS capsule Take 1,000 Units by mouth daily.    . clotrimazole-betamethasone (LOTRISONE) cream Apply to rash once or twice daily  . fluocinonide (LIDEX) 0.05 % external solution APPLY TO AFFECTED AREA AS DIRECTED  . lisinopril  (PRINIVIL,ZESTRIL) 10 MG tablet Take 1 tablet (10 mg total) by mouth daily.  . metFORMIN (GLUCOPHAGE) 500 MG tablet TAKE 1 TABLET BY MOUTH 2 TIMES DAILY.  . Multiple Vitamin (MULTIVITAMIN) capsule Take 1 capsule by mouth daily.    . pantoprazole (PROTONIX) 40 MG tablet Take 1 tablet (40 mg total) by mouth daily.  Angelia Mould TEST test strip TEST BLOOD SUGAR ONCE DAILY    No Known Allergies   Current Medications, Allergies, Past Medical History, Past Surgical History, Family History, and Social History were reviewed in [employer] record.    Review of Systems        The patient complains of nocturia and arthritis.  The patient denies fever, chills, sweats, anorexia, fatigue, weakness, malaise, weight loss, sleep disorder, blurring, diplopia, eye irritation, eye discharge, vision loss, eye pain, photophobia, earache, ear discharge, tinnitus, decreased hearing, nasal congestion, nosebleeds, sore throat, hoarseness, chest pain, palpitations, syncope, dyspnea on exertion, orthopnea, PND, peripheral edema, cough, dyspnea at rest, excessive sputum, hemoptysis, wheezing, pleurisy, nausea, vomiting, diarrhea, constipation, change in bowel habits, abdominal pain, melena, hematochezia, jaundice, gas/bloating, indigestion/heartburn, dysphagia, odynophagia, dysuria, hematuria, urinary frequency, urinary hesitancy, incontinence, back pain, joint pain, joint swelling, muscle cramps, muscle weakness, stiffness, sciatica, restless legs, leg pain at night, leg pain with exertion, rash, itching, dryness, suspicious lesions, paralysis, paresthesias, seizures, tremors, vertigo, transient blindness, frequent falls, frequent headaches, difficulty walking, depression, anxiety, memory loss, confusion, cold intolerance, heat intolerance, polydipsia, polyphagia, polyuria, unusual weight change, abnormal bruising, bleeding, enlarged lymph nodes, urticaria, allergic rash, hay fever, and recurrent  infections.     Objective:   Physical Exam     WD, WN, 80 y/o BM in NAD... GENERAL:  Alert & oriented; [place] & cooperative... HEENT:  [place]/AT, EOM-wnl, PERRLA, EACs-clear, TMs-wnl, NOSE-clear, THROAT-clear & wnl. NECK:  Supple w/ fairROM; no JVD; normal carotid impulses w/o bruits; no thyromegaly or nodules palpated; no lymphadenopathy. CHEST:  Clear to P & A; without wheezes/ rales/ or rhonchi. HEART:  Regular Rhythm; without murmurs/ rubs/ or gallops. ABDOMEN:  Soft & nontender; normal bowel sounds; no organomegaly or masses detected. EXT: without deformities, mild arthritic changes; decr ROM shoulders; no varicose veins/ venous insuffic/ or edema. NEURO:  CN's intact; no focal neuro deficits... DERM:  No lesions noted; no rash etc...  RADIOLOGY DATA:  Reviewed in the EPIC EMR & discussed w/ the patient...  LABORATORY DATA:  Reviewed in the EPIC EMR & discussed w/ the patient...   Assessment & Plan:    HBP>  Controlled on Lisinopril + diet;  Continue same...  HYPERLIPID>  FLP looks good on Lip20 + diet;  Continue same...  DM>  On Metformin monotherapy + diet; his A1c is stable at 7.1 & we can hold off on new meds for now; admonished to stay on diet + exercise, etc...  GERD>  Stable on PPI Rx  Hx Hems, Divertics, Polyp>  DrKaplan did f/u colon 12/12 w/ divertics & diminutive hyperplastic polyp removed...  GU> nodular prostate, neg bx, BPH>  Followed by Gerilyn Pilgrim on Tamsulosin prn & pt reports all neg- doing well...  DJD>  Aware; he uses OTC analgesics, going satis after drWainers last surg 12/13...  Other medical  problems as noted....   Patient's Medications  New Prescriptions   No medications on file  Previous Medications   ALPRAZOLAM (XANAX) 0.5 MG TABLET    Take 1/2 to 1 tablet by mouth three times daily as needed for nerves   ASPIRIN (ASPIRIN LOW DOSE) 81 MG EC TABLET    TAKE 1 TABLET BY MOUTH EVERY DAY   ATORVASTATIN (LIPITOR) 20 MG TABLET    TAKE 1 TABLET BY MOUTH  DAILY.   CHOLECALCIFEROL (VITAMIN D) 1000 UNITS CAPSULE    Take 1,000 Units by mouth daily.     CLOTRIMAZOLE-BETAMETHASONE (LOTRISONE) CREAM    Apply to rash once or twice daily   LISINOPRIL (PRINIVIL,ZESTRIL) 10 MG TABLET    Take 1 tablet (10 mg total) by mouth daily.   METFORMIN (GLUCOPHAGE) 500 MG TABLET    TAKE 1 TABLET BY MOUTH 2 TIMES DAILY.   MULTIPLE VITAMIN (MULTIVITAMIN) CAPSULE    Take 1 capsule by mouth daily.     PANTOPRAZOLE (PROTONIX) 40 MG TABLET    Take 1 tablet (40 mg total) by mouth daily.  Modified Medications   Modified Medication Previous Medication   FLUOCINONIDE (LIDEX) 0.05 % EXTERNAL SOLUTION fluocinonide (LIDEX) 0.05 % external solution      APPLY TO AFFECTED AREA AS DIRECTED    APPLY TO AFFECTED AREA AS DIRECTED   GLUCOSE BLOOD (TRUETRACK TEST) TEST STRIP TRUETRACK TEST test strip      TEST BLOOD SUGAR ONCE DAILY    TEST BLOOD SUGAR ONCE DAILY  Discontinued Medications   No medications on file

## 2013-08-10 NOTE — Patient Instructions (Signed)
Today we updated your med list in our EPIC system...    Continue your current medications the same...  For your GAS>>    Switch from regular milk to SOY MILK & see if it makes a difference..    Take BOTH- MYLICON tabs and PHAZYME tabs 4 times daily...  Today we did your follow up CXR 7 DM labs...    We will contact you w/ the results when available...   Call for any questions.Marland KitchenMarland Kitchen

## 2013-08-29 ENCOUNTER — Ambulatory Visit (INDEPENDENT_AMBULATORY_CARE_PROVIDER_SITE_OTHER): Payer: Self-pay | Admitting: Family Medicine

## 2013-08-29 ENCOUNTER — Ambulatory Visit (INDEPENDENT_AMBULATORY_CARE_PROVIDER_SITE_OTHER): Payer: Medicare Other | Admitting: Internal Medicine

## 2013-08-29 ENCOUNTER — Encounter: Payer: Self-pay | Admitting: Internal Medicine

## 2013-08-29 VITALS — BP 142/78 | HR 70 | Temp 97.5°F | Resp 16 | Ht 71.0 in | Wt 166.0 lb

## 2013-08-29 VITALS — BP 116/76 | HR 90 | Ht 71.0 in | Wt 167.0 lb

## 2013-08-29 DIAGNOSIS — IMO0001 Reserved for inherently not codable concepts without codable children: Secondary | ICD-10-CM

## 2013-08-29 DIAGNOSIS — E1165 Type 2 diabetes mellitus with hyperglycemia: Secondary | ICD-10-CM

## 2013-08-29 DIAGNOSIS — E119 Type 2 diabetes mellitus without complications: Secondary | ICD-10-CM

## 2013-08-29 DIAGNOSIS — E785 Hyperlipidemia, unspecified: Secondary | ICD-10-CM

## 2013-08-29 DIAGNOSIS — I10 Essential (primary) hypertension: Secondary | ICD-10-CM

## 2013-08-29 LAB — HM DIABETES FOOT EXAM

## 2013-08-29 NOTE — Progress Notes (Signed)
Subjective:  Patient presents today for 3 month diabetes follow-up as part of the employer-sponsored Link to Wellness program. Current diabetes regimen includes metformin 500 mg BID. Patient also continues on daily ASA, ACEi, and statin.   Patient had a complete physical last week with Dr. Lenna Gilford. A1C was 7.3 (increased slightly from 7.1%). According to patient he has to find a new PCP since Dr. Lenna Gilford will be considered a specialist (he is also a pulmonologist). Patient wants to stay with [facility] but hasn't chosen a PCP yet. No changes to his medications or medical history.    Disease Assessments:  Diabetes:  Type of Diabetes: Type 2; Year of diagnosis 2007; Sees Diabetes provider 1 time a year; MD managing Diabetes Dr. Teressa Lower; checks feet daily; checks blood glucose 2-6 times a week; uses glucometer; takes medications as prescribed; takes an aspirin a day; hypoglycemia frequency never;   Highest CBG 115; Lowest CBG 80; Other Diabetes History: He got a refill for testing supplies but they did not fit his old meter (True Track). Patient received a new meter today for the True Result. He had run out of test strips so he hasn't checked his blood sugar for a few days.   He is usually testing once or twice a week in the morning, fasting. He states that it normally runs about 90-110. He states that if he eats a larger portion or sweets it will be greater than 110. I had patient check his blood sugar in the office to demonstrate competency with his new meter and it was 84 mg/dL today.   Patient reported high- 115; low- 80.   Physical Activity-   He is still using the treadmill four times a week for 30-45 minutes. He is also walking quite a bit as his job as a Presenter, broadcasting at [employer].   Nutrition-  States he is eating a lot of vegetables. He goes out to [employer] frequently. Eats fish on Fridays. Doesn't drink sodas. States that he is trying to drink more water recently. His last BMet shows an  elevated BUN and Dr. Lenna Gilford recommended to drink more water.       Hemoglobin A1c: 08/10/2013 via dr. Jeannine Kitten office 7.3    Dilated Eye Exam: 03/28/2013  Flu vaccine: 03/23/2013  Foot Exam: 03/16/2013  Other Preventive Care Notes: Dental Visit- he went in August 2014.       Vital Signs:  08/19/2013 3:06 PM (EST) Blood Pressure 155 / 78 mm/HgBMI 23.3; Height 5 ft 11 in; Pulse Rate 70 bpm; Weight 167 lbs    Testing:  Blood Sugar Tests:  Hemoglobin A1c: 7.3 via dr. Jeannine Kitten office resulted on 08/10/2013    Objective:  Musculoskeletal: feet and toes normal.    Assessment/Plan: Patient is an 80 year old male with DM2. Most recent A1C with Dr. Lenna Gilford in February was 7.3% and is close to meeting goal of less than 7%. Patient continues to do well. He is still exercising each week and continues to make healthy eating choices.   Follow up with patient when I return from maternity leave.  .    Goals for Next Visit-  1. Choose a new Primary Care Doctor and make an appointment to see him/her in the next 6 months. 2. Log into the wellness website- [email or web address] and start to login your physical activity so that you can earn the extra money. 3. Continue physical activity and continue making healthy eating choices.  Next appointment to see me is Friday  July 24th at 10 AM.

## 2013-08-29 NOTE — Assessment & Plan Note (Signed)
The A1C shows good control of the blood sugars

## 2013-08-29 NOTE — Progress Notes (Signed)
Subjective:    Patient ID: Dale Anderson, male    DOB: 1929/10/12, 80 y.o.   MRN: [id number]  Diabetes He presents for his follow-up diabetic visit. He has type 2 diabetes mellitus. His disease course has been stable. There are no hypoglycemic associated symptoms. Pertinent negatives for hypoglycemia include no dizziness. Pertinent negatives for diabetes include no blurred vision, no chest pain, no fatigue, no foot paresthesias, no foot ulcerations, no polydipsia, no polyphagia, no polyuria, no visual change, no weakness and no weight loss. There are no hypoglycemic complications. There are no diabetic complications. Current diabetic treatment includes oral agent (monotherapy). He is compliant with treatment all of the time. His weight is stable. He is following a generally healthy diet. Meal planning includes avoidance of concentrated sweets. He has not had a previous visit with a dietician. He participates in exercise daily. There is no change in his home blood glucose trend. An ACE inhibitor/angiotensin II receptor blocker is being taken. He does not see a podiatrist.Eye exam is current.      Review of Systems  Constitutional: Negative.  Negative for fever, chills, weight loss, diaphoresis, appetite change and fatigue.  HENT: Negative.   Eyes: Negative.  Negative for blurred vision.  Respiratory: Negative.  Negative for cough, choking, chest tightness, shortness of breath and stridor.   Cardiovascular: Negative.  Negative for chest pain, palpitations and leg swelling.  Gastrointestinal: Negative.  Negative for nausea, vomiting, abdominal pain and diarrhea.  Endocrine: Negative.  Negative for polydipsia, polyphagia and polyuria.  Genitourinary: Negative.   Musculoskeletal: Negative.  Negative for arthralgias, back pain, gait problem, joint swelling, myalgias, neck pain and neck stiffness.  Skin: Negative.   Allergic/Immunologic: Negative.   Neurological: Negative.  Negative for  dizziness, syncope, weakness and light-headedness.  Hematological: Negative.  Negative for adenopathy. Does not bruise/bleed easily.       Objective:   Physical Exam  Vitals reviewed. Constitutional: He is oriented to person, place, and time. He appears well-developed and well-nourished. No distress.  HENT:  Head: Normocephalic and atraumatic.  Mouth/Throat: Oropharynx is clear and moist. No oropharyngeal exudate.  Eyes: Conjunctivae are normal. Right eye exhibits no discharge. No scleral icterus.  Neck: Normal range of motion. Neck supple. No JVD present. No tracheal deviation present. No thyromegaly present.  Cardiovascular: Normal rate, regular rhythm, normal heart sounds and intact distal pulses.  Exam reveals no gallop and no friction rub.   No murmur heard. Pulmonary/Chest: Effort normal and breath sounds normal. No stridor. No respiratory distress. He has no wheezes. He has no rales. He exhibits no tenderness.  Abdominal: Soft. Bowel sounds are normal. He exhibits no distension and no mass. There is no tenderness. There is no rebound and no guarding.  Musculoskeletal: Normal range of motion. He exhibits no tenderness.  Lymphadenopathy:    He has no cervical adenopathy.  Neurological: He is oriented to person, place, and time.  Skin: Skin is warm and dry. No rash noted. He is not diaphoretic. No erythema. No pallor.     Lab Results  Component Value Date   WBC 5.2 02/10/2013   HGB 13.0 02/10/2013   HCT 38.2* 02/10/2013   PLT 198.0 02/10/2013   GLUCOSE 105* 08/10/2013   CHOL 119 02/10/2013   TRIG 42.0 02/10/2013   HDL 47.00 02/10/2013   LDLCALC 64 02/10/2013   ALT 23 02/10/2013   AST 25 02/10/2013   NA 136 08/10/2013   K 5.1 08/10/2013   CL 105 08/10/2013  CREATININE 1.4 08/10/2013   BUN 37* 08/10/2013   CO2 25 08/10/2013   TSH 2.25 02/10/2013   PSA 2.02 02/10/2013   HGBA1C 7.3* 08/10/2013       Assessment & Plan:

## 2013-08-29 NOTE — Assessment & Plan Note (Signed)
Goal achieved 

## 2013-08-29 NOTE — Patient Instructions (Signed)
Type 2 Diabetes Mellitus, Adult Type 2 diabetes mellitus, often simply referred to as type 2 diabetes, is a long-lasting (chronic) disease. In type 2 diabetes, the pancreas does not make enough insulin (a hormone), the cells are less responsive to the insulin that is made (insulin resistance), or both. Normally, insulin moves sugars from food into the tissue cells. The tissue cells use the sugars for energy. The lack of insulin or the lack of normal response to insulin causes excess sugars to build up in the blood instead of going into the tissue cells. As a result, high blood sugar (hyperglycemia) develops. The effect of high sugar (glucose) levels can cause many complications. Type 2 diabetes was also previously called adult-onset diabetes but it can occur at any age.  RISK FACTORS  A person is predisposed to developing type 2 diabetes if someone in the family has the disease and also has one or more of the following primary risk factors:  Overweight.  An inactive lifestyle.  A history of consistently eating high-calorie foods. Maintaining a normal weight and regular physical activity can reduce the chance of developing type 2 diabetes. SYMPTOMS  A person with type 2 diabetes may not show symptoms initially. The symptoms of type 2 diabetes appear slowly. The symptoms include:  Increased thirst (polydipsia).  Increased urination (polyuria).  Increased urination during the night (nocturia).  Weight loss. This weight loss may be rapid.  Frequent, recurring infections.  Tiredness (fatigue).  Weakness.  Vision changes, such as blurred vision.  Fruity smell to your breath.  Abdominal pain.  Nausea or vomiting.  Cuts or bruises which are slow to heal.  Tingling or numbness in the hands or feet. DIAGNOSIS Type 2 diabetes is frequently not diagnosed until complications of diabetes are present. Type 2 diabetes is diagnosed when symptoms or complications are present and when blood  glucose levels are increased. Your blood glucose level may be checked by one or more of the following blood tests:  A fasting blood glucose test. You will not be allowed to eat for at least 8 hours before a blood sample is taken.  A random blood glucose test. Your blood glucose is checked at any time of the day regardless of when you ate.  A hemoglobin A1c blood glucose test. A hemoglobin A1c test provides information about blood glucose control over the previous 3 months.  An oral glucose tolerance test (OGTT). Your blood glucose is measured after you have not eaten (fasted) for 2 hours and then after you drink a glucose-containing beverage. TREATMENT   You may need to take insulin or diabetes medicine daily to keep blood glucose levels in the desired range.  You will need to match insulin dosing with exercise and healthy food choices. The treatment goal is to maintain the before meal blood sugar (preprandial glucose) level at 70 130 mg/dL. HOME CARE INSTRUCTIONS   Have your hemoglobin A1c level checked twice a year.  Perform daily blood glucose monitoring as directed by your caregiver.  Monitor urine ketones when you are ill and as directed by your caregiver.  Take your diabetes medicine or insulin as directed by your caregiver to maintain your blood glucose levels in the desired range.  Never run out of diabetes medicine or insulin. It is needed every day.  Adjust insulin based on your intake of carbohydrates. Carbohydrates can raise blood glucose levels but need to be included in your diet. Carbohydrates provide vitamins, minerals, and fiber which are an essential part of   a healthy diet. Carbohydrates are found in fruits, vegetables, whole grains, dairy products, legumes, and foods containing added sugars.    Eat healthy foods. Alternate 3 meals with 3 snacks.  Lose weight if overweight.  Carry a medical alert card or wear your medical alert jewelry.  Carry a 15 gram  carbohydrate snack with you at all times to treat low blood glucose (hypoglycemia). Some examples of 15 gram carbohydrate snacks include:  Glucose tablets, 3 or 4   Glucose gel, 15 gram tube  Raisins, 2 tablespoons (24 grams)  Jelly beans, 6  Animal crackers, 8  Regular pop, 4 ounces (120 mL)  Gummy treats, 9  Recognize hypoglycemia. Hypoglycemia occurs with blood glucose levels of 70 mg/dL and below. The risk for hypoglycemia increases when fasting or skipping meals, during or after intense exercise, and during sleep. Hypoglycemia symptoms can include:  Tremors or shakes.  Decreased ability to concentrate.  Sweating.  Increased heart rate.  Headache.  Dry mouth.  Hunger.  Irritability.  Anxiety.  Restless sleep.  Altered speech or coordination.  Confusion.  Treat hypoglycemia promptly. If you are alert and able to safely swallow, follow the 15:15 rule:  Take 15 20 grams of rapid-acting glucose or carbohydrate. Rapid-acting options include glucose gel, glucose tablets, or 4 ounces (120 mL) of fruit juice, regular soda, or low fat milk.  Check your blood glucose level 15 minutes after taking the glucose.  Take 15 20 grams more of glucose if the repeat blood glucose level is still 70 mg/dL or below.  Eat a meal or snack within 1 hour once blood glucose levels return to normal.    Be alert to polyuria and polydipsia which are early signs of hyperglycemia. An early awareness of hyperglycemia allows for prompt treatment. Treat hyperglycemia as directed by your caregiver.  Engage in at least 150 minutes of moderate-intensity physical activity a week, spread over at least 3 days of the week or as directed by your caregiver. In addition, you should engage in resistance exercise at least 2 times a week or as directed by your caregiver.  Adjust your medicine and food intake as needed if you start a new exercise or sport.  Follow your sick day plan at any time you  are unable to eat or drink as usual.  Avoid tobacco use.  Limit alcohol intake to no more than 1 drink per day for nonpregnant women and 2 drinks per day for men. You should drink alcohol only when you are also eating food. Talk with your caregiver whether alcohol is safe for you. Tell your caregiver if you drink alcohol several times a week.  Follow up with your caregiver regularly.  Schedule an eye exam soon after the diagnosis of type 2 diabetes and then annually.  Perform daily skin and foot care. Examine your skin and feet daily for cuts, bruises, redness, nail problems, bleeding, blisters, or sores. A foot exam by a caregiver should be done annually.  Brush your teeth and gums at least twice a day and floss at least once a day. Follow up with your dentist regularly.  Share your diabetes management plan with your workplace or school.  Stay up-to-date with immunizations.  Learn to manage stress.  Obtain ongoing diabetes education and support as needed.  Participate in, or seek rehabilitation as needed to maintain or improve independence and quality of life. Request a physical or occupational therapy referral if you are having foot or hand numbness or difficulties with grooming,   dressing, eating, or physical activity. SEEK MEDICAL CARE IF:   You are unable to eat food or drink fluids for more than 6 hours.  You have nausea and vomiting for more than 6 hours.  Your blood glucose level is over 240 mg/dL.  There is a change in mental status.  You develop an additional serious illness.  You have diarrhea for more than 6 hours.  You have been sick or have had a fever for a couple of days and are not getting better.  You have pain during any physical activity.  SEEK IMMEDIATE MEDICAL CARE IF:  You have difficulty breathing.  You have moderate to large ketone levels. MAKE SURE YOU:  Understand these instructions.  Will watch your condition.  Will get help right away if  you are not doing well or get worse. Document Released: 06/09/2005 Document Revised: 03/03/2012 Document Reviewed: 01/06/2012 [employer] Patient Information 2014 [employer].  

## 2013-08-29 NOTE — Assessment & Plan Note (Signed)
His BP is well controlled 

## 2013-09-12 ENCOUNTER — Emergency Department (INDEPENDENT_AMBULATORY_CARE_PROVIDER_SITE_OTHER): Payer: Medicare Other

## 2013-09-12 ENCOUNTER — Emergency Department (INDEPENDENT_AMBULATORY_CARE_PROVIDER_SITE_OTHER)
Admission: EM | Admit: 2013-09-12 | Discharge: 2013-09-12 | Disposition: A | Discharge status: Home / Self Care | Payer: Medicare Other | Source: Home / Self Care | Attending: Family Medicine | Admitting: Family Medicine

## 2013-09-12 ENCOUNTER — Encounter (HOSPITAL_COMMUNITY): Payer: Self-pay | Admitting: Emergency Medicine

## 2013-09-12 DIAGNOSIS — J40 Bronchitis, not specified as acute or chronic: Secondary | ICD-10-CM

## 2013-09-12 MED ORDER — AZITHROMYCIN 250 MG PO TABS: 250.0000 mg | ORAL_TABLET | Freq: Every day | ORAL | Status: DC

## 2013-09-12 MED ORDER — ALBUTEROL SULFATE HFA 108 (90 BASE) MCG/ACT IN AERS: 2.0000 | INHALATION_SPRAY | Freq: Four times a day (QID) | RESPIRATORY_TRACT | Status: DC | PRN

## 2013-09-12 MED ORDER — PREDNISONE 10 MG PO TABS: 30.0000 mg | ORAL_TABLET | Freq: Every day | ORAL | Status: DC

## 2013-09-12 MED ORDER — GUAIFENESIN-CODEINE 100-10 MG/5ML PO SOLN: 5.0000 mL | Freq: Every evening | ORAL | Status: DC | PRN

## 2013-09-12 NOTE — ED Notes (Signed)
C/o sick x past 10 days w cough, congestion; minimal relief w OTC medication

## 2013-09-12 NOTE — ED Provider Notes (Signed)
Dale Anderson is a 80 y.o. male who presents to Urgent Care today for cough. Patient has productive cough congestion runny nose for the past 10 days. He has tried multiple over-the-counter medications which have not helped. He denies any significant shortness of breath or wheezing. He has a history of recurrent bronchiectasis. He denies any smoking history. No nausea vomiting diarrhea. No chest pains or palpitations.   Past Medical History  Diagnosis Date  . Recurrent bronchiectasis   . Hypertension   . Hyperlipidemia   . Diabetes mellitus   . GERD (gastroesophageal reflux disease)   . Hemorrhoid   . Nodular prostate without urinary obstruction   . DJD (degenerative joint disease)   . Anxiety   . History of shingles   . Tear of right rotator cuff   . Adhesive capsulitis of right shoulder    History  Substance Use Topics  . Smoking status: Never Smoker   . Smokeless tobacco: Never Used  . Alcohol Use: No   ROS as above Medications: No current facility-administered medications for this encounter.   Current Outpatient Prescriptions  Medication Sig Dispense Refill  . albuterol (PROVENTIL HFA;VENTOLIN HFA) 108 (90 BASE) MCG/ACT inhaler Inhale 2 puffs into the lungs every 6 (six) hours as needed for wheezing or shortness of breath.  1 Inhaler  2  . ALPRAZolam (XANAX) 0.5 MG tablet Take 1/2 to 1 tablet by mouth three times daily as needed for nerves  90 tablet  5  . aspirin (ASPIRIN LOW DOSE) 81 MG EC tablet TAKE 1 TABLET BY MOUTH EVERY DAY  90 tablet  3  . atorvastatin (LIPITOR) 20 MG tablet TAKE 1 TABLET BY MOUTH DAILY.  90 tablet  3  . azithromycin (ZITHROMAX) 250 MG tablet Take 1 tablet (250 mg total) by mouth daily. Take first 2 tablets together, then 1 every day until finished.  6 tablet  0  . Cholecalciferol (VITAMIN D) 1000 UNITS capsule Take 1,000 Units by mouth daily.        . clotrimazole-betamethasone (LOTRISONE) cream Apply to rash once or twice daily  30 g  6  .  fluocinonide (LIDEX) 0.05 % external solution APPLY TO AFFECTED AREA AS DIRECTED  60 mL  1  . glucose blood (TRUETRACK TEST) test strip TEST BLOOD SUGAR ONCE DAILY  50 each  3  . guaiFENesin-codeine 100-10 MG/5ML syrup Take 5 mLs by mouth at bedtime as needed for cough.  120 mL  0  . ketoconazole (NIZORAL) 2 % shampoo       . lisinopril (PRINIVIL,ZESTRIL) 10 MG tablet Take 1 tablet (10 mg total) by mouth daily.  90 tablet  3  . metFORMIN (GLUCOPHAGE) 500 MG tablet TAKE 1 TABLET BY MOUTH 2 TIMES DAILY.  180 tablet  3  . Multiple Vitamin (MULTIVITAMIN) capsule Take 1 capsule by mouth daily.        . pantoprazole (PROTONIX) 40 MG tablet Take 1 tablet (40 mg total) by mouth daily.  90 tablet  3  . predniSONE (DELTASONE) 10 MG tablet Take 3 tablets (30 mg total) by mouth daily.  15 tablet  0  . triamcinolone cream (KENALOG) 0.1 %         Exam:  BP 151/71  Pulse 77  Temp(Src) 98.1 F (36.7 C) (Oral)  Resp 18  SpO2 96% Gen: Well NAD HEENT: EOMI,  MMM Lungs: Normal work of breathing. CTABL Heart: RRR no MRG Abd: NABS, Soft. NT, ND Exts: Brisk capillary refill, warm and well  perfused.   No results found for this or any previous visit (from the past 24 hour(s)). Dg Chest 2 View  09/12/2013   CLINICAL DATA:  History of recurrent bronchitis. Hypertension. Cough and congestion with sore throat for 10 days. Diabetic.  EXAM: CHEST  2 VIEW  COMPARISON:  08/10/2013 and 12/21/2007.  FINDINGS: Minimal peribronchial thickening stable suggestive of minimal chronic bronchitis type changes.  No infiltrate, congestive heart failure or pneumothorax.  Nodularity right lung base similar to 2009 examination and most likely nipple shadow. This could be confirmed with nipple marker view.  Calcified mildly tortuous aorta.  Heart size within normal limits.  Mild degenerative changes thoracic spine.  IMPRESSION: Minimal peribronchial thickening stable suggestive of minimal chronic bronchitis type changes.  Probable  nipple shadow right lung base.  Calcified mildly tortuous aorta.  Please see above.   Electronically Signed   By: Chauncey Cruel M.D.   On: 09/12/2013 10:03    Assessment and Plan: 80 y.o. male with bronchitis. Plan to treat with albuterol, prednisone, and azithromycin, and codeine containing cough medication. Recommend repeat chest x-ray with primary care provider in about a month.  Discussed warning signs or symptoms. Please see discharge instructions. Patient expresses understanding.    Gregor Hams, MD 09/12/13 1034

## 2013-09-12 NOTE — Discharge Instructions (Signed)
Thank you for coming in today. Take prednisone and azithromycin daily for 5 days Use the albuterol inhaler as needed. Take cough medication as needed at bedtime. Followup with your Dr. in a few weeks for repeat chest x-ray. Call or go to the emergency room if you get worse, have trouble breathing, have chest pains, or palpitations.   Bronchitis Bronchitis is inflammation of the airways that extend from the windpipe into the lungs (bronchi). The inflammation often causes mucus to develop, which leads to a cough. If the inflammation becomes severe, it may cause shortness of breath. CAUSES  Bronchitis may be caused by:   Viral infections.   Bacteria.   Cigarette smoke.   Allergens, pollutants, and other irritants.  SIGNS AND SYMPTOMS  The most common symptom of bronchitis is a frequent cough that produces mucus. Other symptoms include:  Fever.   Body aches.   Chest congestion.   Chills.   Shortness of breath.   Sore throat.  DIAGNOSIS  Bronchitis is usually diagnosed through a medical history and physical exam. Tests, such as chest X-rays, are sometimes done to rule out other conditions.  TREATMENT  You may need to avoid contact with whatever caused the problem (smoking, for example). Medicines are sometimes needed. These may include:  Antibiotics. These may be prescribed if the condition is caused by bacteria.  Cough suppressants. These may be prescribed for relief of cough symptoms.   Inhaled medicines. These may be prescribed to help open your airways and make it easier for you to breathe.   Steroid medicines. These may be prescribed for those with recurrent (chronic) bronchitis. HOME CARE INSTRUCTIONS  Get plenty of rest.   Drink enough fluids to keep your urine clear or pale yellow (unless you have a medical condition that requires fluid restriction). Increasing fluids may help thin your secretions and will prevent dehydration.   Only take  over-the-counter or prescription medicines as directed by your health care provider.  Only take antibiotics as directed. Make sure you finish them even if you start to feel better.  Avoid secondhand smoke, irritating chemicals, and strong fumes. These will make bronchitis worse. If you are a smoker, quit smoking. Consider using nicotine gum or skin patches to help control withdrawal symptoms. Quitting smoking will help your lungs heal faster.   Put a cool-mist humidifier in your bedroom at night to moisten the air. This may help loosen mucus. Change the water in the humidifier daily. You can also run the hot water in your shower and sit in the bathroom with the door closed for 5 10 minutes.   Follow up with your health care provider as directed.   Wash your hands frequently to avoid catching bronchitis again or spreading an infection to others.  SEEK MEDICAL CARE IF: Your symptoms do not improve after 1 week of treatment.  SEEK IMMEDIATE MEDICAL CARE IF:  Your fever increases.  You have chills.   You have chest pain.   You have worsening shortness of breath.   You have bloody sputum.  You faint.  You have lightheadedness.  You have a severe headache.   You vomit repeatedly. MAKE SURE YOU:   Understand these instructions.  Will watch your condition.  Will get help right away if you are not doing well or get worse. Document Released: 06/09/2005 Document Revised: 03/30/2013 Document Reviewed: 02/01/2013 [facility] Patient Information 2014 [place].

## 2013-10-06 ENCOUNTER — Other Ambulatory Visit: Payer: Self-pay

## 2013-10-06 MED ORDER — PANTOPRAZOLE SODIUM 40 MG PO TBEC: 40.0000 mg | DELAYED_RELEASE_TABLET | Freq: Every day | ORAL | Status: DC

## 2013-11-28 ENCOUNTER — Other Ambulatory Visit: Payer: Self-pay | Admitting: Pulmonary Disease

## 2013-12-19 ENCOUNTER — Encounter (HOSPITAL_COMMUNITY): Payer: Self-pay | Admitting: Emergency Medicine

## 2013-12-19 ENCOUNTER — Emergency Department (INDEPENDENT_AMBULATORY_CARE_PROVIDER_SITE_OTHER)
Admission: EM | Admit: 2013-12-19 | Discharge: 2013-12-19 | Disposition: A | Discharge status: Home / Self Care | Payer: Medicare Other | Source: Home / Self Care | Attending: Emergency Medicine | Admitting: Emergency Medicine

## 2013-12-19 DIAGNOSIS — S139XXA Sprain of joints and ligaments of unspecified parts of neck, initial encounter: Secondary | ICD-10-CM

## 2013-12-19 DIAGNOSIS — X58XXXA Exposure to other specified factors, initial encounter: Secondary | ICD-10-CM

## 2013-12-19 DIAGNOSIS — S161XXA Strain of muscle, fascia and tendon at neck level, initial encounter: Secondary | ICD-10-CM

## 2013-12-19 MED ORDER — METHOCARBAMOL 500 MG PO TABS: 500.0000 mg | ORAL_TABLET | Freq: Three times a day (TID) | ORAL | Status: DC

## 2013-12-19 MED ORDER — TRAMADOL HCL 50 MG PO TABS: 100.0000 mg | ORAL_TABLET | Freq: Three times a day (TID) | ORAL | Status: DC | PRN

## 2013-12-19 NOTE — Discharge Instructions (Signed)
TREATMENT  °Treatment initially involves the use of ice and medication to help reduce pain and inflammation. It is also important to perform strengthening and stretching exercises and modify activities that worsen symptoms so the injury does not get worse. These exercises may be performed at home or with a therapist. For patients who experience severe symptoms, a soft padded collar may be recommended to be worn around the neck.  °Improving your posture may help reduce symptoms. Posture improvement includes pulling your chin and abdomen in while sitting or standing. If you are sitting, sit in a firm chair with your buttocks against the back of the chair. While sleeping, try replacing your pillow with a small towel rolled to 2 inches in diameter, or use a cervical pillow. Poor sleeping positions delay healing.  ° °MEDICATION  °· If pain medication is necessary, nonsteroidal anti-inflammatory medications, such as aspirin and ibuprofen, or other minor pain relievers, such as acetaminophen, are often recommended. °· Do not take pain medication for 7 days before surgery. °· Prescription pain relievers may be given if deemed necessary by your caregiver. Use only as directed and only as much as you need. ° °HEAT AND COLD:  °· Cold treatment (icing) relieves pain and reduces inflammation. Cold treatment should be applied for 10 to 15 minutes every 2 to 3 hours for inflammation and pain and immediately after any activity that aggravates your symptoms. Use ice packs or an ice massage. °· Heat treatment may be used prior to performing the stretching and strengthening activities prescribed by your caregiver, physical therapist, or athletic trainer. Use a heat pack or a warm soak. ° °SEEK MEDICAL CARE IF:  °· Symptoms get worse or do not improve in 2 weeks despite treatment. °· New, unexplained symptoms develop (drugs used in treatment may produce side effects). ° °EXERCISES °RANGE OF MOTION (ROM) AND STRETCHING EXERCISES -  Cervical Strain and Sprain °These exercises may help you when beginning to rehabilitate your injury. In order to successfully resolve your symptoms, you must improve your posture. These exercises are designed to help reduce the forward-head and rounded-shoulder posture which contributes to this condition. Your symptoms may resolve with or without further involvement from your physician, physical therapist or athletic trainer. While completing these exercises, remember:  °· Restoring tissue flexibility helps normal motion to return to the joints. This allows healthier, less painful movement and activity. °· An effective stretch should be held for at least 20 seconds, although you may need to begin with shorter hold times for comfort. °· A stretch should never be painful. You should only feel a gentle lengthening or release in the stretched tissue. ° °STRETCH- Axial Extensors °· Lie on your back on the floor. You may bend your knees for comfort. Place a rolled up hand towel or dish towel, about 2 inches in diameter, under the part of your head that makes contact with the floor. °· Gently tuck your chin, as if trying to make a "double chin," until you feel a gentle stretch at the base of your head. °· Hold _____10_____ seconds. °Repeat _____10_____ times. Complete this exercise _____2_____ times per day.  ° °STRETECH - Axial Extension  °· Stand or sit on a firm surface. Assume a good posture: chest up, shoulders drawn back, abdominal muscles slightly tense, knees unlocked (if standing) and feet hip width apart. °· Slowly retract your chin so your head slides back and your chin slightly lowers.Continue to look straight ahead. °· You should feel a gentle stretch   in the back of your head. Be certain not to feel an aggressive stretch since this can cause headaches later. °· Hold for ____10______ seconds. °Repeat _____10_____ times. Complete this exercise ____2______ times per day. ° °STRETCH  Cervical Side Bend  °· Stand  or sit on a firm surface. Assume a good posture: chest up, shoulders drawn back, abdominal muscles slightly tense, knees unlocked (if standing) and feet hip width apart. °· Without letting your nose or shoulders move, slowly tip your right / left ear to your shoulder until your feel a gentle stretch in the muscles on the opposite side of your neck. °· Hold _____10_____ seconds. °Repeat _____10_____ times. Complete this exercise _____2_____ times per day. ° °STRETCH  Cervical Rotators  °· Stand or sit on a firm surface. Assume a good posture: chest up, shoulders drawn back, abdominal muscles slightly tense, knees unlocked (if standing) and feet hip width apart. °· Keeping your eyes level with the ground, slowly turn your head until you feel a gentle stretch along the back and opposite side of your neck. °· Hold _____10_____ seconds. °Repeat ____10______ times. Complete this exercise ____2______ times per day. ° °RANGE OF MOTION - Neck Circles  °· Stand or sit on a firm surface. Assume a good posture: chest up, shoulders drawn back, abdominal muscles slightly tense, knees unlocked (if standing) and feet hip width apart. °· Gently roll your head down and around from the back of one shoulder to the back of the other. The motion should never be forced or painful. °· Repeat the motion 10-20 times, or until you feel the neck muscles relax and loosen. °Repeat ____10______ times. Complete the exercise _____2_____ times per day. ° °STRENGTHENING EXERCISES - Cervical Strain and Sprain °These exercises may help you when beginning to rehabilitate your injury. They may resolve your symptoms with or without further involvement from your physician, physical therapist or athletic trainer. While completing these exercises, remember:  °· Muscles can gain both the endurance and the strength needed for everyday activities through controlled exercises. °· Complete these exercises as instructed by your physician, physical therapist or  athletic trainer. Progress the resistance and repetitions only as guided. °· You may experience muscle soreness or fatigue, but the pain or discomfort you are trying to eliminate should never worsen during these exercises. If this pain does worsen, stop and make certain you are following the directions exactly. If the pain is still present after adjustments, discontinue the exercise until you can discuss the trouble with your clinician. ° °STRENGTH Cervical Flexors, Isometric °· Face a wall, standing about 6 inches away. Place a small pillow, a ball about 6-8 inches in diameter, or a folded towel between your forehead and the wall. °· Slightly tuck your chin and gently push your forehead into the soft object. Push only with mild to moderate intensity, building up tension gradually. Keep your jaw and forehead relaxed. °· Hold 10 to 20 seconds. Keep your breathing relaxed. °· Release the tension slowly. Relax your neck muscles completely before you start the next repetition. °Repeat _____10_____ times. Complete this exercise _____2_____ times per day. ° °STRENGTH- Cervical Lateral Flexors, Isometric  °· Stand about 6 inches away from a wall. Place a small pillow, a ball about 6-8 inches in diameter, or a folded towel between the side of your head and the wall. °· Slightly tuck your chin and gently tilt your head into the soft object. Push only with mild to moderate intensity, building up tension gradually. Keep   your jaw and forehead relaxed. °· Hold 10 to 20 seconds. Keep your breathing relaxed. °· Release the tension slowly. Relax your neck muscles completely before you start the next repetition. °Repeat _____10_____ times. Complete this exercise ____2______ times per day. ° °STRENGTH  Cervical Extensors, Isometric  °· Stand about 6 inches away from a wall. Place a small pillow, a ball about 6-8 inches in diameter, or a folded towel between the back of your head and the wall. °· Slightly tuck your chin and gently  tilt your head back into the soft object. Push only with mild to moderate intensity, building up tension gradually. Keep your jaw and forehead relaxed. °· Hold 10 to 20 seconds. Keep your breathing relaxed. °· Release the tension slowly. Relax your neck muscles completely before you start the next repetition. °Repeat _____10_____ times. Complete this exercise _____2_____ times per day. ° °POSTURE AND BODY MECHANICS CONSIDERATIONS - Cervical Strain and Sprain °Keeping correct posture when sitting, standing or completing your activities will reduce the stress put on different body tissues, allowing injured tissues a chance to heal and limiting painful experiences. The following are general guidelines for improved posture. Your physician or physical therapist will provide you with any instructions specific to your needs. While reading these guidelines, remember: °· The exercises prescribed by your provider will help you have the flexibility and strength to maintain correct postures. °· The correct posture provides the optimal environment for your joints to work. All of your joints have less wear and tear when properly supported by a spine with good posture. This means you will experience a healthier, less painful body. °· Correct posture must be practiced with all of your activities, especially prolonged sitting and standing. Correct posture is as important when doing repetitive low-stress activities (typing) as it is when doing a single heavy-load activity (lifting). °PROLONGED STANDING WHILE SLIGHTLY LEANING FORWARD °When completing a task that requires you to lean forward while standing in one place for a long time, place either foot up on a stationary 2-4 inch high object to help maintain the best posture. When both feet are on the ground, the low back tends to lose its slight inward curve. If this curve flattens (or becomes too large), then the back and your other joints will experience too much stress, fatigue  more quickly and can cause pain.  °RESTING POSITIONS °Consider which positions are most painful for you when choosing a resting position. If you have pain with flexion-based activities (sitting, bending, stooping, squatting), choose a position that allows you to rest in a less flexed posture. You would want to avoid curling into a fetal position on your side. If your pain worsens with extension-based activities (prolonged standing, working overhead), avoid resting in an extended position such as sleeping on your stomach. Most people will find more comfort when they rest with their spine in a more neutral position, neither too rounded nor too arched. Lying on a non-sagging bed on your side with a pillow between your knees, or on your back with a pillow under your knees will often provide some relief. Keep in mind, being in any one position for a prolonged period of time, no matter how correct your posture, can still lead to stiffness. °WALKING °Walk with an upright posture. Your ears, shoulders and hips should all line-up. °OFFICE WORK °When working at a desk, create an environment that supports good, upright posture. Without extra support, muscles fatigue and lead to excessive strain on joints and other tissues. °  CHAIR: °· A chair should be able to slide under your desk when your back makes contact with the back of the chair. This allows you to work closely. °· The chair's height should allow your eyes to be level with the upper part of your monitor and your hands to be slightly lower than your elbows. °· Body position: °· Your feet should make contact with the floor. If this is not possible, use a foot rest. °· Keep your ears over your shoulders. This will reduce stress on your neck and low back. °Document Released: 06/09/2005 Document Revised: 09/01/2011 Document Reviewed: 09/21/2008 °[employer]® Patient Information ©2013 [employer]. ° ° °

## 2013-12-19 NOTE — ED Provider Notes (Signed)
  Chief Complaint   Chief Complaint  Patient presents with  . 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 He presents with a three-day history of pain along his right trapezius ridge. He denies any injury to the area. It hurts to bend the neck to the right or to rotate to the right. He's had no headache. He denies any radiation of the pain down the arm, no numbness, tingling, or weakness. No chest pain or shortness of breath.  Review of Systems   Other than noted above, the patient denies any of the following symptoms: Constitutional:  No fever or chills. Neck:  No swelling, or adenopathy.   Cardiac:  No chest pain, tightness, or pressure. Respiratory:  No cough or dyspnea. M-S:  No joint pain, muscle pain, or back problems. Neuro:  No headache, muscle weakness, or paresthesias.  [place]   Past medical history, family history, social history, meds, and allergies were reviewed.  He has diabetes and takes a variety of medications including alprazolam, aspirin, Lipitor, metformin, and pantoprazole.  Physical Examination    Vital signs:  BP 123/56  Pulse 81  Temp(Src) 98 F (36.7 C) (Oral)  Resp 16  SpO2 97% General:  Alert, oriented and in no distress. Eye:  PERRL, full EOMs. ENT:  Pharynx clear, no oral lesions. Neck:  There was mild pain to palpation over the right trapezius ridge. The neck had a full range of motion with pain on lateral bending to the right and rotation to the right.Marland Kitchen  Spurling's test was negative. Lungs:  No respiratory distress.  Breath sounds clear and equal bilaterally.  No wheezes, rales or rhonchi. Heart:  Regular rhythm.  No gallops, murmers, or rubs. Ext:  No upper extremity edema, pulses full.  Full ROM of joints with no joint or muscle pain to palpation. Neuro:  Alert and oriented times 3.  No focal muscle weakness.  DTRs symmetric.  Sensation intact to light  touch. Skin: Clear, warm and dry.  No rash.  Good capillary refill.  Assessment   The encounter diagnosis was Cervical strain, initial encounter.  Differential diagnosis is muscular strain versus arthritis.  Plan    1.  Meds:  The following meds were prescribed:   Discharge Medication List as of 12/19/2013  8:41 AM    START taking these medications   Details  methocarbamol (ROBAXIN) 500 MG tablet Take 1 tablet (500 mg total) by mouth 3 (three) times daily., Starting 12/19/2013, Until Discontinued, Normal    traMADol (ULTRAM) 50 MG tablet Take 2 tablets (100 mg total) by mouth every 8 (eight) hours as needed., Starting 12/19/2013, Until Discontinued, Print        2.  Patient Education/Counseling:  The patient was given appropriate handouts, self care instructions, and instructed in symptomatic relief.  Suggested exercises, heat or cold, and massage.  3.  Follow up:  The patient was told to follow up here if no better in 3 to 4 days, or sooner if becoming worse in any way, and given some red flag symptoms such as worsening pain or new neurological symptoms which would prompt immediate return.       Harden Mo, MD 12/19/13 [phone]

## 2013-12-19 NOTE — ED Notes (Signed)
No known injury, pain in right shoulder, into neck

## 2013-12-20 ENCOUNTER — Telehealth: Payer: Self-pay

## 2013-12-20 NOTE — Telephone Encounter (Signed)
Please advise if ok to refill alprazolam, pt last seen 08/29/13

## 2013-12-20 NOTE — Telephone Encounter (Signed)
ok 

## 2013-12-21 MED ORDER — ALPRAZOLAM 0.5 MG PO TABS: ORAL_TABLET | ORAL | Status: DC

## 2013-12-21 NOTE — Telephone Encounter (Signed)
RX called in .

## 2013-12-21 NOTE — Addendum Note (Signed)
Addended by: Estell Harpin T on: 12/21/2013 08:34 AM   Modules accepted: Orders

## 2013-12-26 ENCOUNTER — Ambulatory Visit (INDEPENDENT_AMBULATORY_CARE_PROVIDER_SITE_OTHER)
Admission: RE | Admit: 2013-12-26 | Discharge: 2013-12-26 | Disposition: A | Discharge status: Home / Self Care | Payer: Medicare Other | Source: Ambulatory Visit | Attending: Internal Medicine | Admitting: Internal Medicine

## 2013-12-26 ENCOUNTER — Encounter: Payer: Self-pay | Admitting: Internal Medicine

## 2013-12-26 ENCOUNTER — Other Ambulatory Visit (INDEPENDENT_AMBULATORY_CARE_PROVIDER_SITE_OTHER): Payer: Medicare Other

## 2013-12-26 ENCOUNTER — Ambulatory Visit (INDEPENDENT_AMBULATORY_CARE_PROVIDER_SITE_OTHER): Payer: Medicare Other | Admitting: Internal Medicine

## 2013-12-26 VITALS — BP 122/66 | HR 77 | Temp 97.7°F | Resp 16 | Ht 71.0 in | Wt 167.0 lb

## 2013-12-26 DIAGNOSIS — M5030 Other cervical disc degeneration, unspecified cervical region: Secondary | ICD-10-CM | POA: Insufficient documentation

## 2013-12-26 DIAGNOSIS — Z0000 Encounter for general adult medical examination without abnormal findings: Secondary | ICD-10-CM | POA: Insufficient documentation

## 2013-12-26 DIAGNOSIS — E785 Hyperlipidemia, unspecified: Secondary | ICD-10-CM

## 2013-12-26 DIAGNOSIS — M542 Cervicalgia: Secondary | ICD-10-CM

## 2013-12-26 DIAGNOSIS — E1165 Type 2 diabetes mellitus with hyperglycemia: Secondary | ICD-10-CM

## 2013-12-26 DIAGNOSIS — Z23 Encounter for immunization: Secondary | ICD-10-CM

## 2013-12-26 DIAGNOSIS — IMO0001 Reserved for inherently not codable concepts without codable children: Secondary | ICD-10-CM

## 2013-12-26 DIAGNOSIS — I10 Essential (primary) hypertension: Secondary | ICD-10-CM

## 2013-12-26 LAB — COMPREHENSIVE METABOLIC PANEL
ALK PHOS: 61 U/L (ref 39–117)
ALT: 23 U/L (ref 0–53)
AST: 25 U/L (ref 0–37)
Albumin: 4.2 g/dL (ref 3.5–5.2)
BILIRUBIN TOTAL: 0.6 mg/dL (ref 0.2–1.2)
BUN: 29 mg/dL — ABNORMAL HIGH (ref 6–23)
CO2: 27 mEq/L (ref 19–32)
Calcium: 9.8 mg/dL (ref 8.4–10.5)
Chloride: 107 mEq/L (ref 96–112)
Creatinine, Ser: 1.5 mg/dL (ref 0.4–1.5)
GFR: 58.58 mL/min — ABNORMAL LOW (ref >60.00)
Glucose, Bld: 114 mg/dL — ABNORMAL HIGH (ref 70–99)
Potassium: 4.3 mEq/L (ref 3.5–5.1)
SODIUM: 140 meq/L (ref 135–145)
TOTAL PROTEIN: 7.6 g/dL (ref 6.0–8.3)

## 2013-12-26 LAB — CBC WITH DIFFERENTIAL/PLATELET
BASOS ABS: 0 10*3/uL (ref 0.0–0.1)
Basophils Relative: 0.4 % (ref 0.0–3.0)
EOS PCT: 1.9 % (ref 0.0–5.0)
Eosinophils Absolute: 0.1 10*3/uL (ref 0.0–0.7)
HEMATOCRIT: 39.3 % (ref 39.0–52.0)
Hemoglobin: 13.3 g/dL (ref 13.0–17.0)
LYMPHS ABS: 1.3 10*3/uL (ref 0.7–4.0)
Lymphocytes Relative: 22.9 % (ref 12.0–46.0)
MCHC: 33.8 g/dL (ref 30.0–36.0)
MCV: 93.5 fl (ref 78.0–100.0)
Monocytes Absolute: 0.5 10*3/uL (ref 0.1–1.0)
Monocytes Relative: 8.8 % (ref 3.0–12.0)
Neutro Abs: 3.7 10*3/uL (ref 1.4–7.7)
Neutrophils Relative %: 66 % (ref 43.0–77.0)
PLATELETS: 191 10*3/uL (ref 150.0–400.0)
RBC: 4.21 Mil/uL — ABNORMAL LOW (ref 4.22–5.81)
RDW: 13.9 % (ref 11.5–15.5)
WBC: 5.6 10*3/uL (ref 4.0–10.5)

## 2013-12-26 LAB — TSH: TSH: 1.51 u[IU]/mL (ref 0.35–4.50)

## 2013-12-26 LAB — LIPID PANEL
Cholesterol: 152 mg/dL (ref 0–200)
HDL: 44.8 mg/dL (ref >39.00)
LDL CALC: 78 mg/dL (ref 0–99)
NonHDL: 107.2
Total CHOL/HDL Ratio: 3
Triglycerides: 147 mg/dL (ref 0.0–149.0)
VLDL: 29.4 mg/dL (ref 0.0–40.0)

## 2013-12-26 LAB — FECAL OCCULT BLOOD, GUAIAC: FECAL OCCULT BLD: NEGATIVE

## 2013-12-26 LAB — HEMOGLOBIN A1C: Hgb A1c MFr Bld: 7.1 % — ABNORMAL HIGH (ref 4.6–6.5)

## 2013-12-26 NOTE — Progress Notes (Signed)
Pre visit review using our clinic review tool, if applicable. No additional management support is needed unless otherwise documented below in the visit note. 

## 2013-12-26 NOTE — Assessment & Plan Note (Signed)
I will check his A1C and will address if needed Will also monitor his renal function

## 2013-12-26 NOTE — Progress Notes (Signed)
Subjective:    Patient ID: Dale Anderson, male    DOB: 28-Jun-1929, 80 y.o.   MRN: [id number]  Neck Pain  This is a recurrent problem. The current episode started 1 to 4 weeks ago. The problem occurs intermittently. The problem has been unchanged. The pain is associated with nothing. The pain is present in the midline. The quality of the pain is described as aching. The pain is at a severity of 3/10. The pain is mild. Nothing aggravates the symptoms. The pain is same all the time. Pertinent negatives include no chest pain, fever, headaches, numbness, pain with swallowing, paresis, photophobia, tingling, trouble swallowing, visual change or weakness. He has tried muscle relaxants and NSAIDs for the symptoms. The treatment provided moderate relief.      Review of Systems  Constitutional: Negative.  Negative for fever, chills, diaphoresis, appetite change and fatigue.  HENT: Negative.  Negative for trouble swallowing and voice change.   Eyes: Negative.  Negative for photophobia.  Respiratory: Negative.  Negative for apnea, cough, choking, chest tightness, shortness of breath, wheezing and stridor.   Cardiovascular: Negative.  Negative for chest pain, palpitations and leg swelling.  Gastrointestinal: Negative.  Negative for nausea, vomiting, abdominal pain, diarrhea, constipation and blood in stool.  Endocrine: Negative.   Genitourinary: Negative.  Negative for dysuria, urgency, hematuria, decreased urine volume and difficulty urinating.  Musculoskeletal: Positive for neck pain. Negative for arthralgias, back pain, gait problem, joint swelling, myalgias and neck stiffness.  Skin: Negative.  Negative for rash.  Allergic/Immunologic: Negative.   Neurological: Negative.  Negative for dizziness, tingling, tremors, weakness, light-headedness, numbness and headaches.  Hematological: Negative.  Negative for adenopathy. Does not bruise/bleed easily.  Psychiatric/Behavioral: Negative.          Objective:   Physical Exam  Vitals reviewed. Constitutional: He is oriented to person, place, and time. He appears well-developed and well-nourished. No distress.  HENT:  Head: Normocephalic and atraumatic.  Mouth/Throat: Oropharynx is clear and moist.  Eyes: Conjunctivae are normal. Right eye exhibits no discharge. Left eye exhibits no discharge. No scleral icterus.  Neck: Normal range of motion. Neck supple. No JVD present. No tracheal deviation present. No thyromegaly present.  Cardiovascular: Normal rate, regular rhythm, normal heart sounds and intact distal pulses.  Exam reveals no gallop and no friction rub.   No murmur heard. Pulmonary/Chest: Effort normal and breath sounds normal. No stridor. No respiratory distress. He has no wheezes. He has no rales. He exhibits no tenderness.  Abdominal: Soft. Bowel sounds are normal. He exhibits no distension. There is no tenderness. There is no rebound and no guarding. Hernia confirmed negative in the right inguinal area and confirmed negative in the left inguinal area.  Genitourinary: Rectum normal, testes normal and penis normal. Rectal exam shows no external hemorrhoid, no internal hemorrhoid, no fissure, no mass, no tenderness and anal tone normal. Guaiac negative stool. Prostate is enlarged (1+ smooth symm BPH). Prostate is not tender. Right testis shows no mass, no swelling and no tenderness. Right testis is descended. Left testis shows no mass, no swelling and no tenderness. Left testis is descended. Circumcised. No penile erythema or penile tenderness. No discharge found.  Musculoskeletal: Normal range of motion. He exhibits no edema and no tenderness.       Cervical back: Normal. He exhibits normal range of motion, no tenderness, no bony tenderness, no swelling, no edema, no deformity, no laceration, no pain, no spasm and normal pulse.  Lymphadenopathy:    He has  no cervical adenopathy.       Right: No inguinal adenopathy present.        Left: No inguinal adenopathy present.  Neurological: He is oriented to person, place, and time.  Skin: Skin is warm and dry. No rash noted. He is not diaphoretic. No erythema. No pallor.  Psychiatric: He has a normal mood and affect. His behavior is normal. Judgment and thought content normal.     Lab Results  Component Value Date   WBC 5.2 02/10/2013   HGB 13.0 02/10/2013   HCT 38.2* 02/10/2013   PLT 198.0 02/10/2013   GLUCOSE 105* 08/10/2013   CHOL 119 02/10/2013   TRIG 42.0 02/10/2013   HDL 47.00 02/10/2013   LDLCALC 64 02/10/2013   ALT 23 02/10/2013   AST 25 02/10/2013   NA 136 08/10/2013   K 5.1 08/10/2013   CL 105 08/10/2013   CREATININE 1.4 08/10/2013   BUN 37* 08/10/2013   CO2 25 08/10/2013   TSH 2.25 02/10/2013   PSA 2.02 02/10/2013   HGBA1C 7.3* 08/10/2013       Assessment & Plan:

## 2013-12-26 NOTE — Patient Instructions (Signed)
Health Maintenance A healthy lifestyle and preventative care can promote health and wellness.  Maintain regular health, dental, and eye exams.  Eat a healthy diet. Foods like vegetables, fruits, whole grains, low-fat dairy products, and lean protein foods contain the nutrients you need and are low in calories. Decrease your intake of foods high in solid fats, added sugars, and salt. Get information about a proper diet from your health care provider, if necessary.  Regular physical exercise is one of the most important things you can do for your health. Most adults should get at least 150 minutes of moderate-intensity exercise (any activity that increases your heart rate and causes you to sweat) each week. In addition, most adults need muscle-strengthening exercises on 2 or more days a week.   Maintain a healthy weight. The body mass index (BMI) is a screening tool to identify possible weight problems. It provides an estimate of body fat based on height and weight. Your health care provider can find your BMI and can help you achieve or maintain a healthy weight. For males 20 years and older:  A BMI below 18.5 is considered underweight.  A BMI of 18.5 to 24.9 is normal.  A BMI of 25 to 29.9 is considered overweight.  A BMI of 30 and above is considered obese.  Maintain normal blood lipids and cholesterol by exercising and minimizing your intake of saturated fat. Eat a balanced diet with plenty of fruits and vegetables. Blood tests for lipids and cholesterol should begin at age 20 and be repeated every 5 years. If your lipid or cholesterol levels are high, you are over age 50, or you are at high risk for heart disease, you may need your cholesterol levels checked more frequently.Ongoing high lipid and cholesterol levels should be treated with medicines if diet and exercise are not working.  If you smoke, find out from your health care provider how to quit. If you do not use tobacco, do not  start.  Lung cancer screening is recommended for adults aged 55-80 years who are at high risk for developing lung cancer because of a history of smoking. A yearly low-dose CT scan of the lungs is recommended for people who have at least a 30-pack-year history of smoking and are current smokers or have quit within the past 15 years. A pack year of smoking is smoking an average of 1 pack of cigarettes a day for 1 year (for example, a 30-pack-year history of smoking could mean smoking 1 pack a day for 30 years or 2 packs a day for 15 years). Yearly screening should continue until the smoker has stopped smoking for at least 15 years. Yearly screening should be stopped for people who develop a health problem that would prevent them from having lung cancer treatment.  If you choose to drink alcohol, do not have more than 2 drinks per day. One drink is considered to be 12 oz (360 mL) of beer, 5 oz (150 mL) of wine, or 1.5 oz (45 mL) of liquor.  Avoid the use of street drugs. Do not share needles with anyone. Ask for help if you need support or instructions about stopping the use of drugs.  High blood pressure causes heart disease and increases the risk of stroke. Blood pressure should be checked at least every 1-2 years. Ongoing high blood pressure should be treated with medicines if weight loss and exercise are not effective.  If you are 45-79 years old, ask your health care provider if   you should take aspirin to prevent heart disease.  Diabetes screening involves taking a blood sample to check your fasting blood sugar level. This should be done once every 3 years after age 45 if you are at a normal weight and without risk factors for diabetes. Testing should be considered at a younger age or be carried out more frequently if you are overweight and have at least 1 risk factor for diabetes.  Colorectal cancer can be detected and often prevented. Most routine colorectal cancer screening begins at the age of 50  and continues through age 75. However, your health care provider may recommend screening at an earlier age if you have risk factors for colon cancer. On a yearly basis, your health care provider may provide home test kits to check for hidden blood in the stool. A small camera at the end of a tube may be used to directly examine the colon (sigmoidoscopy or colonoscopy) to detect the earliest forms of colorectal cancer. Talk to your health care provider about this at age 50 when routine screening begins. A direct exam of the colon should be repeated every 5-10 years through age 75, unless early forms of precancerous polyps or small growths are found.  People who are at an increased risk for hepatitis B should be screened for this virus. You are considered at high risk for hepatitis B if:  You were born in a country where hepatitis B occurs often. Talk with your health care provider about which countries are considered high risk.  Your parents were born in a high-risk country and you have not received a shot to protect against hepatitis B (hepatitis B vaccine).  You have HIV or AIDS.  You use needles to inject street drugs.  You live with, or have sex with, someone who has hepatitis B.  You are a man who has sex with other men (MSM).  You get hemodialysis treatment.  You take certain medicines for conditions like cancer, organ transplantation, and autoimmune conditions.  Hepatitis C blood testing is recommended for all people born from 1945 through 1965 and any individual with known risk factors for hepatitis C.  Healthy men should no longer receive prostate-specific antigen (PSA) blood tests as part of routine cancer screening. Talk to your health care provider about prostate cancer screening.  Testicular cancer screening is not recommended for adolescents or adult males who have no symptoms. Screening includes self-exam, a health care provider exam, and other screening tests. Consult with your  health care provider about any symptoms you have or any concerns you have about testicular cancer.  Practice safe sex. Use condoms and avoid high-risk sexual practices to reduce the spread of sexually transmitted infections (STIs).  You should be screened for STIs, including gonorrhea and chlamydia if:  You are sexually active and are younger than 24 years.  You are older than 24 years, and your health care provider tells you that you are at risk for this type of infection.  Your sexual activity has changed since you were last screened, and you are at an increased risk for chlamydia or gonorrhea. Ask your health care provider if you are at risk.  If you are at risk of being infected with HIV, it is recommended that you take a prescription medicine daily to prevent HIV infection. This is called pre-exposure prophylaxis (PrEP). You are considered at risk if:  You are a man who has sex with other men (MSM).  You are a heterosexual man who   is sexually active with multiple partners.  You take drugs by injection.  You are sexually active with a partner who has HIV.  Talk with your health care provider about whether you are at high risk of being infected with HIV. If you choose to begin PrEP, you should first be tested for HIV. You should then be tested every 3 months for as long as you are taking PrEP.  Use sunscreen. Apply sunscreen liberally and repeatedly throughout the day. You should seek shade when your shadow is shorter than you. Protect yourself by wearing long sleeves, pants, a wide-brimmed hat, and sunglasses year round whenever you are outdoors.  Tell your health care provider of new moles or changes in moles, especially if there is a change in shape or color. Also, tell your health care provider if a mole is larger than the size of a pencil eraser.  A one-time screening for abdominal aortic aneurysm (AAA) and surgical repair of large AAAs by ultrasound is recommended for men aged  65-75 years who are current or former smokers.  Stay current with your vaccines (immunizations). Document Released: 12/06/2007 Document Revised: 06/14/2013 Document Reviewed: 11/04/2010 Exit[employer] Patient Information 2015 [employer]. This information is not intended to replace advice given to you by your health care provider. Make sure you discuss any questions you have with your health care provider. Torticollis, Acute You have suddenly (acutely) developed a twisted neck (torticollis). This is usually a self-limited condition. CAUSES  Acute torticollis may be caused by malposition, trauma or infection. Most commonly, acute torticollis is caused by sleeping in an awkward position. Torticollis may also be caused by the flexion, extension or twisting of the neck muscles beyond their normal position. Sometimes, the exact cause may not be known. SYMPTOMS  Usually, there is pain and limited movement of the neck. Your neck may twist to one side. DIAGNOSIS  The diagnosis is often made by physical examination. X-rays, CT scans or MRIs may be done if there is a history of trauma or concern of infection. TREATMENT  For a common, stiff neck that develops during sleep, treatment is focused on relaxing the contracted neck muscle. Medications (including shots) may be used to treat the problem. Most cases resolve in several days. Torticollis usually responds to conservative physical therapy. If left untreated, the shortened and spastic neck muscle can cause deformities in the face and neck. Rarely, surgery is required. HOME CARE INSTRUCTIONS   Use over-the-counter and prescription medications as directed by your caregiver.  Do stretching exercises and massage the neck as directed by your caregiver.  Follow up with physical therapy if needed and as directed by your caregiver. SEEK IMMEDIATE MEDICAL CARE IF:   You develop difficulty breathing or noisy breathing (stridor).  You drool, develop trouble  swallowing or have pain with swallowing.  You develop numbness or weakness in the hands or feet.  You have changes in speech or vision.  You have problems with urination or bowel movements.  You have difficulty walking.  You have a fever.  You have increased pain. MAKE SURE YOU:   Understand these instructions.  Will watch your condition.  Will get help right away if you are not doing well or get worse. Document Released: 06/06/2000 Document Revised: 09/01/2011 Document Reviewed: 07/18/2009 Exit[employer] Patient Information 2015 [employer]. This information is not intended to replace advice given to you by your health care provider. Make sure you discuss any questions you have with your health care provider.  

## 2013-12-26 NOTE — Assessment & Plan Note (Signed)
He is doing well on lipitor Will recheck his FLP today

## 2013-12-26 NOTE — Assessment & Plan Note (Signed)
His exam is normal Will check plain films to look for DDD, spurring, etc. He will cont the current meds for pain

## 2013-12-26 NOTE — Assessment & Plan Note (Addendum)

## 2013-12-26 NOTE — Assessment & Plan Note (Signed)
His BP is well controlled I will monitor his lytes and renal function today 

## 2013-12-27 ENCOUNTER — Encounter: Payer: Self-pay | Admitting: Internal Medicine

## 2013-12-27 ENCOUNTER — Telehealth: Payer: Self-pay | Admitting: Internal Medicine

## 2013-12-27 NOTE — Telephone Encounter (Signed)
Relevant patient education assigned to patient using Emmi. ° °

## 2014-01-13 ENCOUNTER — Ambulatory Visit (INDEPENDENT_AMBULATORY_CARE_PROVIDER_SITE_OTHER): Payer: Self-pay | Admitting: Family Medicine

## 2014-01-13 VITALS — BP 154/78 | HR 62 | Ht 71.0 in | Wt 166.6 lb

## 2014-01-13 DIAGNOSIS — E119 Type 2 diabetes mellitus without complications: Secondary | ICD-10-CM

## 2014-01-13 DIAGNOSIS — E1165 Type 2 diabetes mellitus with hyperglycemia: Secondary | ICD-10-CM

## 2014-01-13 NOTE — Progress Notes (Signed)
Subjective:  Patient presents today for a Link to Wellness appointment in good spirits. He reports that he is doing well. He had some trouble with neck pain but that has resolved. His last visit with Dr. Ronnald Ramp was in July. 1 medication change- methocarbomal was added, but patient reports he is no longer taking that. Last A1C was 7.1%.   Disease Assessments:  Diabetes: Type of Diabetes: Type 2; Year of diagnosis 2007; Sees Diabetes provider 1 time a year; checks feet daily; checks blood glucose 2-6 times a week; uses glucometer; takes medications as prescribed; takes an aspirin a day; hypoglycemia frequency never; MD managing Diabetes Dr. Scarlette Calico;   Highest CBG 158; Lowest CBG 84; 7 day CBG average 126; 14 day CBG average 126; 30 day CBG average 130;   Other Diabetes History: Patient brought his meter to the appointment. Patient denies hypoglycemia. Based on his meter he is checking CBG a few times monthly. A1C was 7.1% earlier this month.     Physical Activity-  He is still using the treadmill at least three times a week for almost an hour. He is also walking quite a bit as his job as a Presenter, broadcasting at [employer]. He does a lot of yardwork.  Nutrition- He reports that he isn't eating as much as he was before. He states that he noticed he was gaining weight so he has cut back lately. His clothes weren't fitting as well as before. This has all been in the last few weeks. He is now eating breakfast and lunch, and then fruit for dinner. He eats at the cafeteria at the hospital and will get food from the salad bar. He states he is eating a lot of vegetables. He states he feels like he is getting enough to eat.    Preventive Care:    Hemoglobin A1c: 12/26/2013 Via epic 7.1    Dilated Eye Exam: 03/28/2013  Flu vaccine: 03/23/2013  Foot Exam: 12/26/2013  Other Preventive Care Notes:  Dental Visit- he went in March 2015      Vital Signs:  01/13/2014 10:08 AM (EST)Blood Pressure 154 / 78  mm/HgBMI 23.2; Height 5 ft 11 in; Pulse Rate 62 bpm; Weight 166.6 lbs   Testing:  Blood Sugar Tests: Hemoglobin A1c: 7.1 via Epic Dr. Ronnald Ramp resulted on 12/26/2013   Assessment/Plan: Patient is an 80 year old male with DM2. Most recent A1C with Dr Ronnald Ramp this past month was 7.1% and is close to meeting goal of less than 7%. Weight is stable since last visit. No hypoglycemia noted. Patient is checking blood sugar 1-2 times a a week. I tried to get patient logged in to the Wellness Website so that he could claim his wellness rewards but we could not get his password reset. Follow up with patient in 4 months. .       Goals for Next Visit- 1. call Terri Skains [phone] to see if you have claimed your badges for the wellness program. You could be earning $200.  2. Continue making healthy eating choices.  Next appointment to see me is Monday November 23rd at 8:30 AM.

## 2014-01-16 ENCOUNTER — Ambulatory Visit (INDEPENDENT_AMBULATORY_CARE_PROVIDER_SITE_OTHER): Payer: Medicare Other | Admitting: Internal Medicine

## 2014-01-16 ENCOUNTER — Encounter: Payer: Self-pay | Admitting: Internal Medicine

## 2014-01-16 VITALS — BP 150/60 | HR 78 | Temp 98.0°F | Resp 16 | Ht 71.0 in | Wt 170.0 lb

## 2014-01-16 DIAGNOSIS — I10 Essential (primary) hypertension: Secondary | ICD-10-CM

## 2014-01-16 DIAGNOSIS — M5030 Other cervical disc degeneration, unspecified cervical region: Secondary | ICD-10-CM

## 2014-01-16 DIAGNOSIS — IMO0001 Reserved for inherently not codable concepts without codable children: Secondary | ICD-10-CM

## 2014-01-16 DIAGNOSIS — E1165 Type 2 diabetes mellitus with hyperglycemia: Secondary | ICD-10-CM

## 2014-01-16 NOTE — Progress Notes (Signed)
   Subjective:    Patient ID: Dale Anderson, male    DOB: 1929/08/02, 80 y.o.   MRN: [id number]  Diabetes He presents for his follow-up diabetic visit. He has type 2 diabetes mellitus. His disease course has been stable. There are no hypoglycemic associated symptoms. Pertinent negatives for diabetes include no blurred vision, no chest pain, no fatigue, no foot paresthesias, no foot ulcerations, no polydipsia, no polyphagia, no polyuria, no visual change, no weakness and no weight loss. There are no hypoglycemic complications. There are no diabetic complications. Current diabetic treatment includes oral agent (monotherapy). He is compliant with treatment all of the time. He is following a generally healthy diet. Meal planning includes avoidance of concentrated sweets. He has not had a previous visit with a dietician. He participates in exercise intermittently. There is no change in his home blood glucose trend. An ACE inhibitor/angiotensin II receptor blocker is being taken. He does not see a podiatrist.Eye exam is current.      Review of Systems  Constitutional: Negative.  Negative for fever, chills, weight loss, diaphoresis, appetite change and fatigue.  HENT: Negative.   Eyes: Negative.  Negative for blurred vision.  Respiratory: Negative.   Cardiovascular: Negative.  Negative for chest pain, palpitations and leg swelling.  Gastrointestinal: Negative.  Negative for nausea, vomiting, abdominal pain, diarrhea, constipation and blood in stool.  Endocrine: Negative.  Negative for polydipsia, polyphagia and polyuria.  Genitourinary: Negative.   Musculoskeletal: Negative.  Negative for arthralgias, back pain, gait problem, joint swelling, myalgias, neck pain and neck stiffness.  Skin: Negative.  Negative for rash.  Allergic/Immunologic: Negative.   Neurological: Negative.  Negative for weakness.  Hematological: Negative.  Negative for adenopathy. Does not bruise/bleed easily.    Psychiatric/Behavioral: Negative.        Objective:   Physical Exam  Vitals reviewed. Constitutional: He is oriented to person, place, and time. He appears well-developed and well-nourished. No distress.  HENT:  Head: Normocephalic and atraumatic.  Mouth/Throat: Oropharynx is clear and moist. No oropharyngeal exudate.  Eyes: Conjunctivae are normal. Right eye exhibits no discharge. Left eye exhibits no discharge. No scleral icterus.  Neck: Normal range of motion. Neck supple. No JVD present. No tracheal deviation present. No thyromegaly present.  Cardiovascular: Normal rate, regular rhythm, normal heart sounds and intact distal pulses.  Exam reveals no gallop and no friction rub.   No murmur heard. Pulmonary/Chest: Effort normal and breath sounds normal. No stridor. No respiratory distress. He has no wheezes. He has no rales. He exhibits no tenderness.  Abdominal: Soft. Bowel sounds are normal. He exhibits no distension and no mass. There is no tenderness. There is no rebound and no guarding.  Musculoskeletal: Normal range of motion. He exhibits no edema and no tenderness.  Lymphadenopathy:    He has no cervical adenopathy.  Neurological: He is oriented to person, place, and time.  Skin: Skin is warm and dry. No rash noted. He is not diaphoretic. No erythema. No pallor.          Assessment & Plan:

## 2014-01-16 NOTE — Assessment & Plan Note (Signed)
The pain he had from this has resolved

## 2014-01-16 NOTE — Assessment & Plan Note (Signed)
His BP is well controlled 

## 2014-01-16 NOTE — Assessment & Plan Note (Signed)
His blood sugars are well controlled 

## 2014-01-16 NOTE — Progress Notes (Signed)
Pre visit review using our clinic review tool, if applicable. No additional management support is needed unless otherwise documented below in the visit note. 

## 2014-01-16 NOTE — Patient Instructions (Signed)

## 2014-01-25 ENCOUNTER — Encounter: Payer: Self-pay | Admitting: Family Medicine

## 2014-01-25 NOTE — Progress Notes (Signed)
Patient ID: Dale Anderson, male   DOB: Nov 20, 1929, 80 y.o.   MRN: [id number] Reviewed: Agree with our pharmacologist's documentation and management.

## 2014-01-25 NOTE — Progress Notes (Signed)
Patient ID: Dale Anderson, male   DOB: 07-05-1929, 80 y.o.   MRN: [id number] Reviewed: Agree with our pharmacologist's documentation and management.

## 2014-01-26 ENCOUNTER — Ambulatory Visit: Payer: Medicare Other | Admitting: Internal Medicine

## 2014-03-01 ENCOUNTER — Encounter: Payer: Self-pay | Admitting: Family Medicine

## 2014-03-01 NOTE — Progress Notes (Signed)
Patient ID: Dale Anderson, male   DOB: 1930-02-07, 80 y.o.   MRN: [id number] Reviewed: Agree with the documentation and management of our [facility] pharmacologist.

## 2014-05-15 ENCOUNTER — Ambulatory Visit: Payer: Medicare Other | Admitting: Internal Medicine

## 2014-05-19 ENCOUNTER — Ambulatory Visit (INDEPENDENT_AMBULATORY_CARE_PROVIDER_SITE_OTHER): Payer: Self-pay | Admitting: Family Medicine

## 2014-05-19 VITALS — BP 132/72 | Wt 167.0 lb

## 2014-05-19 DIAGNOSIS — E119 Type 2 diabetes mellitus without complications: Secondary | ICD-10-CM

## 2014-05-19 NOTE — Assessment & Plan Note (Signed)
Subjective:  Patient presents today for 3 month diabetes follow-up as part of the employer-sponsored Link to Wellness program. Current diabetes regimen includes metformin 500 mg twice daily. Patient also continues on daily ASA, ACEi, and statin.   Patient states that things have been going well. He last saw PCP 3-4 months ago. He does not have any appointments pending to see PCP. Last A1C in July was 7.1%.   Disease Assessments:  Diabetes: Type of Diabetes: Type 2; Year of diagnosis 2007; Sees Diabetes provider 1 time a year; checks feet daily; uses glucometer; takes medications as prescribed; takes an aspirin a day; hypoglycemia frequency never; MD managing Diabetes Dr. Scarlette Calico;   7 day CBG average 119; 14 day CBG average 119; 30 day CBG average 118; checks blood glucose less than 1 time a week; Highest CBG 129; Lowest CBG 106;   Other Diabetes History:  CBG averages are about 5-10 points lower than his last visit with me. He has a few checks for this past month, no checks for October and a couple of checks for September.  A1C today was 6.9%.          Tobacco Assessment: Smoking Status: Never smoker; Last Reviewed: 05/15/2014   Social History:  Denies alcohol use; Denies caffeine use; Social work consult was not done; No drug use; Medication adherence adherent; sexually active; Patient can afford medications; Patient knows the purpose/use of medications; Exercise adherence 3-4 days a week; 150 minutes of exercise per week.  Occupation: Presenter, broadcasting  Physical Activity-  Still using the treadmill in the employee gym. He is walking 20-30 minutes 5 days a week. Also doing yard work at home.  Nutrition- Weight is stable since last visit. He reports no changes. He is eating in the cafeteria while he is at work. He states that lately he has been eating more clementine oranges during the day as a snack.    Preventive Care:      Dilated Eye Exam: 03/23/2014  Flu vaccine: 03/23/2014   Foot Exam: 12/26/2013  Other Preventive Care Notes:  Dental Visit- apt pending 06/22/2014      Overall Health Assessments:  Vision:  Dilated Eye Exam: 03/23/2014   Vital Signs:  05/19/2014 4:46 PM (EST)Blood Pressure 132 / 72 mm/HgBMI 23.3; Height 5 ft 11 in; Weight 167 lbs   Testing:  Blood Sugar Tests: Hemoglobin A1c: 6.9 via POCT resulted on 05/15/2014   Care Planning:  Learning Preference Assessment:  Learner: Patient  Readiness to Learn Barriers: None  Teaching Method: Explanation  Evaluation of Learning: Can function independently and verbalize knowledge  Readiness to Change:  How important is your health to you? 10  How confident are you in working to improve your health? 10  How ready are you to change to improve your health? 10  Total Score: 10     Assessment/Plan: Patient is an 80 year old male with DM2. A1C today was 6.9% which is meeting goal of less than 7%. Patient continues to do extremely well. Weight is stable since his last visit and he is exercising 5 days out of the week.  Helped patient claim badges through the [employer] portal.  I will fax A1C results to Dr. Ronnald Ramp.  Goals for Next Visit- 1. Continue physical activity. 2. Continue making healthy eating choices. Next appointment with me is February 29th at 8 AM.

## 2014-06-12 NOTE — Progress Notes (Signed)
Patient ID: Dale Anderson, male   DOB: 1930/04/18, 80 y.o.   MRN: [id number] Reviewed: Agree with the documentation and management of our [name].

## 2014-06-30 ENCOUNTER — Other Ambulatory Visit: Payer: Self-pay | Admitting: Internal Medicine

## 2014-06-30 ENCOUNTER — Other Ambulatory Visit: Payer: Self-pay | Admitting: Pulmonary Disease

## 2014-06-30 NOTE — Telephone Encounter (Signed)
Per [facility] rx fax to pharmacy...[facility]

## 2014-07-03 ENCOUNTER — Other Ambulatory Visit: Payer: Self-pay

## 2014-07-03 MED ORDER — ATORVASTATIN CALCIUM 20 MG PO TABS: ORAL_TABLET | ORAL | Status: DC

## 2014-07-03 MED ORDER — LISINOPRIL 10 MG PO TABS: 10.0000 mg | ORAL_TABLET | Freq: Every day | ORAL | Status: DC

## 2014-07-03 MED ORDER — METFORMIN HCL 500 MG PO TABS: ORAL_TABLET | ORAL | Status: DC

## 2014-08-04 ENCOUNTER — Telehealth: Payer: Self-pay

## 2014-08-04 NOTE — Telephone Encounter (Signed)
LVM for pt to call back.   RE: Flu vaccine for 2015/2016

## 2014-08-08 NOTE — Telephone Encounter (Signed)
Patient called in. Advised of note for flu shot. He states that he is a [employer] employee and got his flu shot @ the hospital

## 2014-08-21 ENCOUNTER — Ambulatory Visit (INDEPENDENT_AMBULATORY_CARE_PROVIDER_SITE_OTHER): Payer: Self-pay | Admitting: Family Medicine

## 2014-08-21 VITALS — BP 128/66 | Wt 165.6 lb

## 2014-08-21 DIAGNOSIS — E119 Type 2 diabetes mellitus without complications: Secondary | ICD-10-CM

## 2014-08-21 LAB — HEMOGLOBIN A1C: HEMOGLOBIN A1C: 7 % — AB (ref 4.0–6.0)

## 2014-08-21 NOTE — Assessment & Plan Note (Signed)
Subjective:  Patient presents today for 3 month diabetes follow-up as part of the employer-sponsored Link to Wellness program. Current diabetes regimen includes metformin 500 mg twice daily. Patient also continues on daily ASA, ACEi, and statin. Most recent MD follow-up was in July with Dr. Ronnald Ramp. Patient hjas no pending appointment to see PCP. No med changes or major health changes at this time.    Disease Assessments:  Diabetes: Type of Diabetes: Type 2; Year of diagnosis 2007; Sees Diabetes provider 1 time a year; checks feet daily; uses glucometer; takes medications as prescribed; takes an aspirin a day; hypoglycemia frequency never; MD managing Diabetes Dr. Scarlette Calico; checks blood glucose less than 1 time a week;   Highest CBG 148; Lowest CBG 99; 30 day CBG average 116; 14 day CBG average 114; 7 day CBG average 114;   Other Diabetes History: He reports that he has not been checking blood sugar frequently. He is checking less than once a week. He has a handful of checks between January and today. Patient states he wants to start checking on a more regular basis.          Tobacco Assessment: Smoking Status: Never smoker; Last Reviewed: 08/21/2014   Social History:  Denies alcohol use; Denies caffeine use; Social work consult was not done; No drug use; Medication adherence adherent; sexually active; Patient can afford medications; Patient knows the purpose/use of medications; Exercise adherence 3-4 days a week; 120 minutes of exercise per week.  Occupation: Presenter, broadcasting  Physical Activity-  Still using the treadmill in the employee gym. He is walking 3-4 days a week on the treadmill for 30 minutes. He is walking also with his job as a Presenter, broadcasting.  Nutrition- Weight is down 2 pounds since his last visit with me. He reports that he is eating a lot of fruit. He likes to eat clementine oranges.    Preventive Care:    Hemoglobin A1c: 08/21/2014 via POCT    Dilated Eye Exam:  03/23/2014  Flu vaccine: 03/23/2014  Foot Exam: 12/26/2013  Other Preventive Care Notes:  Dental Visit-December 2015      Vital Signs:  08/21/2014 8:16 AM (EST)Blood Pressure 128 / 66 mm/HgBMI 23.1; Height 5 ft 11 in; Weight 165.6 lbs   Testing:  Blood Sugar Tests: Hemoglobin A1c: 7.0 via POCT resulted on 08/21/2014   Care Planning:  Learning Preference Assessment:  Learner: Patient  Readiness to Learn Barriers: None  Teaching Method: Explanation  Evaluation of Learning: Can function independently and verbalize knowledge  Readiness to Change:  How important is your health to you? 10  How confident are you in working to improve your health? 10  How ready are you to change to improve your health? 10  Total Score: 10  Care Plan:  08/21/2014 8:16 AM (EST) (1)  Problem: Glucose Self Monitoring  Role: Clinical Pharmacist  Long Term Goal Check blood sugar at least once a week  Date Started: 08/21/2014   :  Assessment/Plan: Patient is an 81 year old male with DM2. A1C today was 7.0% which is at goal of 7% or less. Patient continues to do extremely well. Weight is down 2 pounds since his last appointment with me. I explained to patient that he was at a healthy weight (BMI 23) and the goal for him now is to maintain his weight. Patient is exercising 3-4 days a week and is making healthy eating choices. I will fax A1C results to Dr. Ronnald Ramp. Follow up with patient  in 3 months.   Goals for Next Visit- 1. In your calendar each week, pick a day to check your blood sugar first thing in the morning before you eat anything. Mark off when you check your blood sugar. 2. Log into [email or web address] to start tracking the days that you are walking on the treadmill. 3. Make an appointment to see Dr. Ronnald Ramp. Next appointment to see me is Friday June 3rd at 8 AM.

## 2014-08-22 NOTE — Progress Notes (Signed)
Not seen

## 2014-08-29 NOTE — Progress Notes (Signed)
Patient ID: Dale Anderson, male   DOB: 1929/07/15, 81 y.o.   MRN: [id number] Reviewed: Agree with the documentation and management of our [place].

## 2014-09-01 ENCOUNTER — Other Ambulatory Visit (INDEPENDENT_AMBULATORY_CARE_PROVIDER_SITE_OTHER): Payer: 59

## 2014-09-01 ENCOUNTER — Encounter: Payer: Self-pay | Admitting: Internal Medicine

## 2014-09-01 ENCOUNTER — Ambulatory Visit (INDEPENDENT_AMBULATORY_CARE_PROVIDER_SITE_OTHER): Payer: 59 | Admitting: Internal Medicine

## 2014-09-01 VITALS — BP 138/72 | HR 69 | Temp 98.5°F | Resp 16 | Ht 71.0 in | Wt 165.0 lb

## 2014-09-01 DIAGNOSIS — I10 Essential (primary) hypertension: Secondary | ICD-10-CM

## 2014-09-01 DIAGNOSIS — E118 Type 2 diabetes mellitus with unspecified complications: Secondary | ICD-10-CM

## 2014-09-01 DIAGNOSIS — E785 Hyperlipidemia, unspecified: Secondary | ICD-10-CM

## 2014-09-01 DIAGNOSIS — Z0000 Encounter for general adult medical examination without abnormal findings: Secondary | ICD-10-CM | POA: Diagnosis not present

## 2014-09-01 LAB — URINALYSIS, ROUTINE W REFLEX MICROSCOPIC
Bilirubin Urine: NEGATIVE
HGB URINE DIPSTICK: NEGATIVE
KETONES UR: NEGATIVE
Leukocytes, UA: NEGATIVE
Nitrite: NEGATIVE
Specific Gravity, Urine: >=1.03 — AB (ref 1.000–1.030)
Total Protein, Urine: NEGATIVE
UROBILINOGEN UA: 0.2 (ref 0.0–1.0)
Urine Glucose: NEGATIVE
pH: 6 (ref 5.0–8.0)

## 2014-09-01 LAB — LIPID PANEL
Cholesterol: 188 mg/dL (ref 0–200)
HDL: 45.4 mg/dL (ref >39.00)
LDL Cholesterol: 126 mg/dL — ABNORMAL HIGH (ref 0–99)
NONHDL: 142.6
Total CHOL/HDL Ratio: 4
Triglycerides: 84 mg/dL (ref 0.0–149.0)
VLDL: 16.8 mg/dL (ref 0.0–40.0)

## 2014-09-01 LAB — CBC WITH DIFFERENTIAL/PLATELET
BASOS ABS: 0 10*3/uL (ref 0.0–0.1)
Basophils Relative: 0.4 % (ref 0.0–3.0)
Eosinophils Absolute: 0.1 10*3/uL (ref 0.0–0.7)
Eosinophils Relative: 2.7 % (ref 0.0–5.0)
HEMATOCRIT: 38.3 % — AB (ref 39.0–52.0)
HEMOGLOBIN: 13.1 g/dL (ref 13.0–17.0)
Lymphocytes Relative: 22.6 % (ref 12.0–46.0)
Lymphs Abs: 1 10*3/uL (ref 0.7–4.0)
MCHC: 34.3 g/dL (ref 30.0–36.0)
MCV: 90.3 fl (ref 78.0–100.0)
Monocytes Absolute: 0.4 10*3/uL (ref 0.1–1.0)
Monocytes Relative: 8.4 % (ref 3.0–12.0)
Neutro Abs: 2.9 10*3/uL (ref 1.4–7.7)
Neutrophils Relative %: 65.9 % (ref 43.0–77.0)
Platelets: 192 10*3/uL (ref 150.0–400.0)
RBC: 4.24 Mil/uL (ref 4.22–5.81)
RDW: 13.8 % (ref 11.5–15.5)
WBC: 4.5 10*3/uL (ref 4.0–10.5)

## 2014-09-01 LAB — MICROALBUMIN / CREATININE URINE RATIO
CREATININE, U: 194.3 mg/dL
MICROALB/CREAT RATIO: 2.6 mg/g (ref 0.0–30.0)
Microalb, Ur: 5.1 mg/dL — ABNORMAL HIGH (ref 0.0–1.9)

## 2014-09-01 LAB — COMPREHENSIVE METABOLIC PANEL
ALT: 17 U/L (ref 0–53)
AST: 16 U/L (ref 0–37)
Albumin: 4.1 g/dL (ref 3.5–5.2)
Alkaline Phosphatase: 60 U/L (ref 39–117)
BUN: 22 mg/dL (ref 6–23)
CALCIUM: 9.5 mg/dL (ref 8.4–10.5)
CHLORIDE: 106 meq/L (ref 96–112)
CO2: 28 mEq/L (ref 19–32)
Creatinine, Ser: 1.21 mg/dL (ref 0.40–1.50)
GFR: 73.78 mL/min (ref >60.00)
Glucose, Bld: 142 mg/dL — ABNORMAL HIGH (ref 70–99)
Potassium: 4.1 mEq/L (ref 3.5–5.1)
SODIUM: 139 meq/L (ref 135–145)
TOTAL PROTEIN: 7.4 g/dL (ref 6.0–8.3)
Total Bilirubin: 0.7 mg/dL (ref 0.2–1.2)

## 2014-09-01 LAB — TSH: TSH: 2.08 u[IU]/mL (ref 0.35–4.50)

## 2014-09-01 MED ORDER — FLUOCINONIDE 0.05 % EX SOLN: CUTANEOUS | Status: DC

## 2014-09-01 MED ORDER — EZETIMIBE 10 MG PO TABS: 10.0000 mg | ORAL_TABLET | Freq: Every day | ORAL | Status: DC

## 2014-09-01 NOTE — Assessment & Plan Note (Signed)
His blood sugars are well controlled His renal function is stable 

## 2014-09-01 NOTE — Assessment & Plan Note (Signed)

## 2014-09-01 NOTE — Patient Instructions (Signed)

## 2014-09-01 NOTE — Assessment & Plan Note (Signed)
He is doing well on the statin but has not achieved his LDL goal Will add zetia

## 2014-09-01 NOTE — Progress Notes (Signed)
Subjective:    Patient ID: Dale Anderson, male    DOB: [birth year], 81 y.o.   MRN: [id number]  Diabetes He presents for his follow-up diabetic visit. He has type 2 diabetes mellitus. His disease course has been stable. There are no hypoglycemic associated symptoms. There are no diabetic associated symptoms. Pertinent negatives for diabetes include no blurred vision, no chest pain, no fatigue, no foot paresthesias, no foot ulcerations, no polydipsia, no polyphagia, no polyuria, no visual change, no weakness and no weight loss. There are no hypoglycemic complications. Symptoms are stable. There are no diabetic complications. Current diabetic treatment includes oral agent (monotherapy). He is compliant with treatment all of the time. His weight is stable. He is following a generally healthy diet. Meal planning includes avoidance of concentrated sweets. He participates in exercise intermittently. There is no change in his home blood glucose trend. An ACE inhibitor/angiotensin II receptor blocker is being taken. Eye exam is current.      Review of Systems  Constitutional: Negative.  Negative for fever, chills, weight loss, diaphoresis, appetite change and fatigue.  HENT: Negative.  Negative for trouble swallowing.   Eyes: Negative.  Negative for blurred vision.  Respiratory: Negative.  Negative for cough, chest tightness, shortness of breath and stridor.   Cardiovascular: Negative.  Negative for chest pain, palpitations and leg swelling.  Gastrointestinal: Negative.  Negative for nausea, vomiting, abdominal pain, diarrhea, constipation and blood in stool.  Endocrine: Negative.  Negative for polydipsia, polyphagia and polyuria.  Genitourinary: Negative.  Negative for dysuria, urgency, hematuria, decreased urine volume and difficulty urinating.  Musculoskeletal: Negative.  Negative for myalgias, back pain, joint swelling and arthralgias.  Skin: Negative.  Negative for rash.    Allergic/Immunologic: Negative.   Neurological: Negative.  Negative for weakness.  Hematological: Negative.  Negative for adenopathy. Does not bruise/bleed easily.  Psychiatric/Behavioral: Negative.        Objective:   Physical Exam  Constitutional: He is oriented to person, place, and time. He appears well-developed and well-nourished. No distress.  HENT:  Head: Normocephalic and atraumatic.  Mouth/Throat: Oropharynx is clear and moist. No oropharyngeal exudate.  Eyes: Conjunctivae are normal. Right eye exhibits no discharge. Left eye exhibits no discharge. No scleral icterus.  Neck: Normal range of motion. Neck supple. No JVD present. No tracheal deviation present. No thyromegaly present.  Cardiovascular: Normal rate, regular rhythm, normal heart sounds and intact distal pulses.  Exam reveals no gallop and no friction rub.   No murmur heard. Pulmonary/Chest: Effort normal and breath sounds normal. No stridor. No respiratory distress. He has no wheezes. He has no rales. He exhibits no tenderness.  Abdominal: Soft. Bowel sounds are normal. He exhibits no distension and no mass. There is no tenderness. There is no rebound and no guarding. Hernia confirmed negative in the right inguinal area and confirmed negative in the left inguinal area.  Genitourinary: Testes normal and penis normal. Right testis shows no mass, no swelling and no tenderness. Right testis is descended. Left testis shows no mass, no swelling and no tenderness. Left testis is descended. Circumcised. No penile erythema or penile tenderness. No discharge found.  Musculoskeletal: Normal range of motion. He exhibits no edema or tenderness.  Lymphadenopathy:    He has no cervical adenopathy.       Right: No inguinal adenopathy present.       Left: No inguinal adenopathy present.  Neurological: He is oriented to person, place, and time.  Skin: Skin is warm and dry. No  rash noted. He is not diaphoretic. No erythema. No pallor.   Psychiatric: He has a normal mood and affect. His behavior is normal. Judgment and thought content normal.  Nursing note and vitals reviewed.    Lab Results  Component Value Date   WBC 5.6 12/26/2013   HGB 13.3 12/26/2013   HCT 39.3 12/26/2013   PLT 191.0 12/26/2013   GLUCOSE 114* 12/26/2013   CHOL 152 12/26/2013   TRIG 147.0 12/26/2013   HDL 44.80 12/26/2013   LDLCALC 78 12/26/2013   ALT 23 12/26/2013   AST 25 12/26/2013   NA 140 12/26/2013   K 4.3 12/26/2013   CL 107 12/26/2013   CREATININE 1.5 12/26/2013   BUN 29* 12/26/2013   CO2 27 12/26/2013   TSH 1.51 12/26/2013   PSA 2.02 02/10/2013   HGBA1C 7.0* 08/21/2014       Assessment & Plan:

## 2014-09-01 NOTE — Progress Notes (Signed)
Pre visit review using our clinic review tool, if applicable. No additional management support is needed unless otherwise documented below in the visit note. 

## 2014-09-01 NOTE — Assessment & Plan Note (Signed)
His BP is well controlled Lytes and renal function are stable 

## 2014-09-07 ENCOUNTER — Telehealth: Payer: Self-pay | Admitting: Internal Medicine

## 2014-09-07 NOTE — Telephone Encounter (Signed)
Pt called in and had several question about his med.... And he also wanted to know result of his cpe    [phone] Cell [phone]

## 2014-09-08 NOTE — Telephone Encounter (Signed)
Patient wife notified.

## 2014-10-20 ENCOUNTER — Other Ambulatory Visit: Payer: Self-pay | Admitting: Internal Medicine

## 2014-10-30 LAB — HM DIABETES EYE EXAM

## 2014-11-15 ENCOUNTER — Encounter: Payer: Self-pay | Admitting: Internal Medicine

## 2014-11-17 ENCOUNTER — Encounter: Payer: Self-pay | Admitting: Pharmacist

## 2014-11-24 ENCOUNTER — Ambulatory Visit: Payer: 59 | Admitting: Pharmacist

## 2014-11-24 ENCOUNTER — Ambulatory Visit (INDEPENDENT_AMBULATORY_CARE_PROVIDER_SITE_OTHER): Payer: Self-pay | Admitting: Family Medicine

## 2014-11-24 VITALS — BP 122/66 | Ht 71.0 in | Wt 161.2 lb

## 2014-11-24 DIAGNOSIS — E119 Type 2 diabetes mellitus without complications: Secondary | ICD-10-CM

## 2014-11-24 LAB — POCT GLYCOSYLATED HEMOGLOBIN (HGB A1C): Hemoglobin A1C: 6.7

## 2014-11-24 NOTE — Progress Notes (Signed)
Subjective:  Patient presents today for 3 month diabetes follow-up as part of the employer-sponsored Link to Wellness program.  Current diabetes regimen includes metformin 500 mg BID. Patient also continues on daily ASA, ACE Inhibitor and statin.  Most recent MD follow-up was in March with Dr. Ronnald Ramp. No major health changes since his last appointment with me. He was recently started on Zetia 10 mg daily at his last appointment with Dr. Ronnald Ramp. He states that he has difficulty affording this medication as it is a $75 copay.     Assessment/Plan:  Patient is a 81  yo male with DM 2. A1C today was 6.7 % which is at goal of less than 7%. Weight is decreased from last visit with me by 3 pounds.  CBG Review: Patient is sporadically checking CBG. In the past month he has checked a couple of times a week. No CBG checks in April. He denies hypoglycemia. CBG fasting range 97-132.  Lifestyle improvements:  Physical Activity-  He is walking on the treadmill daily for 30 minutes.   Nutrition-  He states that he was eating sausage biscuits every day but he has now cut back to just eating a couple of times a week. He states that he has been eating out more lately and this combined with the biscuits is the reason his cholesterol was elevated. He also states that he has cut back on vegetable and fruit intake.    Patient is having difficulty affording Zetia copay. Last LDL in March was 128 (increased from 63). He is taking Atorvastatin 20 mg daily. I will send a message to Dr. Ronnald Ramp to see if we can increase Atorvastatin dose to 40 mg instead of adding Zetia.    Follow up with me in 3 months.    Goals for Next Visit:  1. Increase vegetable intake to at least 2 servings every day.  2. Keep up physical activity. You are doing great.   I will send Dr. Ronnald Ramp a message to see if we can get your cholesterol medicine changed.     Next appointment to see me is: Friday September 2nd at 8 AM.    Dale Blossom D.  Dale Anderson, PharmD, BCPS, CDE [name] to [place] Coordinator [phone]

## 2014-11-28 NOTE — Progress Notes (Signed)
ATTENDING PHYSICIAN NOTE: I have reviewed the chart and agree with the plan as detailed above. Nashika Coker MD Pager [phone]  

## 2014-12-12 NOTE — Progress Notes (Signed)
ATTENDING PHYSICIAN NOTE:Link to wellness Program I have reviewed the chart and agree with the plan as detailed above. Dorcas Mcmurray MD Pager [phone]

## 2015-02-23 ENCOUNTER — Ambulatory Visit: Payer: 59 | Admitting: Family Medicine

## 2015-02-23 ENCOUNTER — Ambulatory Visit: Payer: 59 | Admitting: Pharmacist

## 2015-02-23 ENCOUNTER — Encounter: Payer: Self-pay | Admitting: Pharmacist

## 2015-03-09 ENCOUNTER — Ambulatory Visit (INDEPENDENT_AMBULATORY_CARE_PROVIDER_SITE_OTHER): Payer: Self-pay | Admitting: Family Medicine

## 2015-03-09 ENCOUNTER — Encounter: Payer: Self-pay | Admitting: Pharmacist

## 2015-03-09 VITALS — BP 138/78 | Ht 71.0 in | Wt 168.4 lb

## 2015-03-09 DIAGNOSIS — E119 Type 2 diabetes mellitus without complications: Secondary | ICD-10-CM

## 2015-03-09 NOTE — Progress Notes (Signed)
Subjective:  Patient presents today for 3 month diabetes follow-up as part of the employer-sponsored Link to Wellness program.  Current diabetes regimen includes metformin 500 mg BID.  Patient also continues on daily ASA  & ACE Inhibitor.  Most recent MD follow-up was in March.   Last visit with Dr. Ronnald Anderson was in March. Last A1C was in June 2016 and it was 6.7%. I am not able to check A1C at this time. I asked him to call and make an appointment with Dr. Ronnald Anderson  Patient reports no changes with his health.   Patient is not taking Zetia. It is a non preferred medication with a $75 copay. He does not qualifty for the coupon because of his age. He has not had it refilled since July. He is up to date on other refills, including testing supplies.   Assessment:  Diabetes: Most recent A1C was 6.7  % which is at goal of less than 7%. Weight is increased from last visit with me.    CBG Review: Patient states that he typically checks CBG once a week, always a fasting reading. Meter shows 2-3 checks for September and 1-2 checks for August. Denies hypoglycemia.   Low/High- 96/138 (he states this high reading was after eating a slice of cherry pie)   Lifestyle improvements:  Physical Activity-  Patient states that he is using the employee gym on the days that he works (3-4 days a week). He uses the treadmill and elliptical for 30-45 minutes. He is also walking on his days off.    Nutrition-  Patient reports no changes with his eating habits. Eating vegetables often. He reports that overall he is not eating much, usually small meals. B- fruit, L- soup D- rice and chicken .  One of his goals last visit was to eat 2 servings of vegetables    Follow up with me in 3 months.    Plan/Goals for Next Visit:  1. Call Dr. Ronnald Anderson and make an appointment to have your A1C checked.  2. Keep up with eating fruits and vegetables every day.  3. Continue physical activity.     Next appointment to see me is:  Friday December 16th at 8 AM.    Dale Anderson. Dale Anderson, PharmD, BCPS, CDE [facility] to [place] Coordinator [phone]

## 2015-03-20 ENCOUNTER — Ambulatory Visit (INDEPENDENT_AMBULATORY_CARE_PROVIDER_SITE_OTHER)
Admission: RE | Admit: 2015-03-20 | Discharge: 2015-03-20 | Disposition: A | Discharge status: Home / Self Care | Payer: 59 | Source: Ambulatory Visit | Attending: Internal Medicine | Admitting: Internal Medicine

## 2015-03-20 ENCOUNTER — Encounter: Payer: Self-pay | Admitting: Internal Medicine

## 2015-03-20 ENCOUNTER — Other Ambulatory Visit (INDEPENDENT_AMBULATORY_CARE_PROVIDER_SITE_OTHER): Payer: 59

## 2015-03-20 ENCOUNTER — Ambulatory Visit (INDEPENDENT_AMBULATORY_CARE_PROVIDER_SITE_OTHER): Payer: 59 | Admitting: Internal Medicine

## 2015-03-20 VITALS — BP 140/78 | HR 83 | Temp 98.0°F | Resp 16 | Ht 71.0 in | Wt 162.0 lb

## 2015-03-20 DIAGNOSIS — I10 Essential (primary) hypertension: Secondary | ICD-10-CM

## 2015-03-20 DIAGNOSIS — E118 Type 2 diabetes mellitus with unspecified complications: Secondary | ICD-10-CM

## 2015-03-20 DIAGNOSIS — R059 Cough, unspecified: Secondary | ICD-10-CM

## 2015-03-20 DIAGNOSIS — R05 Cough: Secondary | ICD-10-CM

## 2015-03-20 DIAGNOSIS — J209 Acute bronchitis, unspecified: Secondary | ICD-10-CM | POA: Diagnosis not present

## 2015-03-20 LAB — BASIC METABOLIC PANEL
BUN: 25 mg/dL — ABNORMAL HIGH (ref 6–23)
CALCIUM: 9.5 mg/dL (ref 8.4–10.5)
CHLORIDE: 103 meq/L (ref 96–112)
CO2: 29 meq/L (ref 19–32)
Creatinine, Ser: 1.21 mg/dL (ref 0.40–1.50)
GFR: 73.68 mL/min (ref >60.00)
GLUCOSE: 108 mg/dL — AB (ref 70–99)
Potassium: 4.4 mEq/L (ref 3.5–5.1)
SODIUM: 138 meq/L (ref 135–145)

## 2015-03-20 LAB — HEMOGLOBIN A1C: Hgb A1c MFr Bld: 7.2 % — ABNORMAL HIGH (ref 4.6–6.5)

## 2015-03-20 MED ORDER — AZITHROMYCIN 500 MG PO TABS
500.0000 mg | ORAL_TABLET | Freq: Every day | ORAL | Status: DC
Start: 2015-03-20 — End: 2015-06-08

## 2015-03-20 MED ORDER — PROMETHAZINE-DM 6.25-15 MG/5ML PO SYRP: 5.0000 mL | ORAL_SOLUTION | Freq: Four times a day (QID) | ORAL | Status: DC | PRN

## 2015-03-20 NOTE — Progress Notes (Signed)
Subjective:  Patient ID: Dale Anderson, male    DOB: August 22, 1929  Age: 81 y.o. MRN: [id number]  CC: Diabetes; Hypertension; and Cough   HPI [name] presents for f/up but he complains of a 5 day hx of sore throat, cough productive of yellow phlegm, runny nose, nausea, one episode of diarrhea, and fatigue.   Outpatient Prescriptions Prior to Visit  Medication Sig Dispense Refill  . ALPRAZolam (XANAX) 0.5 MG tablet TAKE 1/2- 1 TABLET BY MOUTH THREE TIMES DAILY AS NEEDED 90 tablet 5  . aspirin (ASPIRIN LOW DOSE) 81 MG EC tablet TAKE 1 TABLET BY MOUTH EVERY DAY 90 tablet 3  . atorvastatin (LIPITOR) 20 MG tablet TAKE 1 TABLET BY MOUTH DAILY. 90 tablet 3  . Cholecalciferol (VITAMIN D) 1000 UNITS capsule Take 1,000 Units by mouth daily.      Marland Kitchen ezetimibe (ZETIA) 10 MG tablet Take 1 tablet (10 mg total) by mouth daily. 90 tablet 3  . fluocinonide (LIDEX) 0.05 % external solution APPLY TO AFFECTED AREA AS DIRECTED 60 mL 2  . glucose blood (TRUETRACK TEST) test strip TEST BLOOD SUGAR ONCE DAILY 50 each 3  . lisinopril (PRINIVIL,ZESTRIL) 10 MG tablet Take 1 tablet (10 mg total) by mouth daily. 90 tablet 3  . metFORMIN (GLUCOPHAGE) 500 MG tablet TAKE 1 TABLET BY MOUTH 2 TIMES DAILY. 180 tablet 3  . Multiple Vitamin (MULTIVITAMIN) capsule Take 1 capsule by mouth daily.      . pantoprazole (PROTONIX) 40 MG tablet TAKE 1 TABLET BY MOUTH DAILY. 90 tablet PRN   No facility-administered medications prior to visit.    ROS Review of Systems  Constitutional: Positive for fatigue. Negative for fever, chills, diaphoresis, appetite change and unexpected weight change.  HENT: Negative.   Eyes: Negative.   Respiratory: Positive for cough. Negative for choking, chest tightness, shortness of breath and stridor.   Cardiovascular: Negative.  Negative for chest pain, palpitations and leg swelling.  Gastrointestinal: Positive for nausea and diarrhea. Negative for vomiting, abdominal pain,  constipation and blood in stool.  Endocrine: Negative.   Genitourinary: Negative.   Musculoskeletal: Negative.   Skin: Negative.  Negative for rash.  Allergic/Immunologic: Negative.   Neurological: Negative.  Negative for dizziness and weakness.  Hematological: Negative.  Negative for adenopathy. Does not bruise/bleed easily.  Psychiatric/Behavioral: Negative.     Objective:  BP 140/78 mmHg  Pulse 83  Temp(Src) 98 F (36.7 C) (Oral)  Resp 16  Ht 5\' 11"  (1.803 m)  Wt 162 lb (73.483 kg)  BMI 22.60 kg/m2  SpO2 98%  BP Readings from Last 3 Encounters:  03/20/15 140/78  03/09/15 138/78  11/24/14 122/66    Wt Readings from Last 3 Encounters:  03/20/15 162 lb (73.483 kg)  03/09/15 168 lb 6.4 oz (76.386 kg)  11/24/14 161 lb 3.2 oz (73.12 kg)    Physical Exam  Constitutional: He is oriented to person, place, and time.  Non-toxic appearance. He does not have a sickly appearance. He does not appear ill. No distress.  HENT:  Mouth/Throat: Oropharynx is clear and moist. No oropharyngeal exudate.  Eyes: Conjunctivae are normal. Right eye exhibits no discharge. Left eye exhibits no discharge. No scleral icterus.  Neck: Normal range of motion. Neck supple. No JVD present. No tracheal deviation present. No thyromegaly present.  Cardiovascular: Normal rate, regular rhythm, normal heart sounds and intact distal pulses.  Exam reveals no gallop and no friction rub.   No murmur heard. Pulmonary/Chest: Effort normal and breath sounds  normal. No stridor. No respiratory distress. He has no wheezes. He has no rales. He exhibits no tenderness.  Abdominal: Soft. Bowel sounds are normal. He exhibits no distension and no mass. There is no tenderness. There is no rebound and no guarding.  Musculoskeletal: Normal range of motion. He exhibits no edema or tenderness.  Lymphadenopathy:    He has no cervical adenopathy.  Neurological: He is oriented to person, place, and time.  Skin: Skin is warm and  dry. No rash noted. He is not diaphoretic. No erythema. No pallor.    Lab Results  Component Value Date   WBC 4.5 09/01/2014   HGB 13.1 09/01/2014   HCT 38.3* 09/01/2014   PLT 192.0 09/01/2014   GLUCOSE 108* 03/20/2015   CHOL 188 09/01/2014   TRIG 84.0 09/01/2014   HDL 45.40 09/01/2014   LDLCALC 126* 09/01/2014   ALT 17 09/01/2014   AST 16 09/01/2014   NA 138 03/20/2015   K 4.4 03/20/2015   CL 103 03/20/2015   CREATININE 1.21 03/20/2015   BUN 25* 03/20/2015   CO2 29 03/20/2015   TSH 2.08 09/01/2014   PSA 2.02 02/10/2013   HGBA1C 7.2* 03/20/2015   MICROALBUR 5.1* 09/01/2014    Dg Cervical Spine Complete  12/26/2013   CLINICAL DATA:  Neck pain.  EXAM: CERVICAL SPINE  4+ VIEWS  COMPARISON:  None.  FINDINGS: Diffuse degenerative changes cervical spine. Degenerative changes are severe. Multilevel disc degeneration, prominent endplate osteophytes and bilateral neural foraminal narrowing is present. No evidence of fracture or dislocation. Pulmonary apices are clear.  IMPRESSION: Severe diffuse degenerative change cervical spine.   Electronically Signed   By: Marcello Moores  Register   On: 12/26/2013 16:38    Assessment & Plan:   [name] was seen today for diabetes, hypertension and cough.  Diagnoses and all orders for this visit:  Essential hypertension- his BP is well controlled, lytes and renal function are stable -     Basic metabolic panel; Future  Type II diabetes mellitus with manifestations- his blood sugars are well controlled, renal function is stable -     Basic metabolic panel; Future -     Hemoglobin A1c; Future  Cough- his CXR is normal -     promethazine-dextromethorphan (PROMETHAZINE-DM) 6.25-15 MG/5ML syrup; Take 5 mLs by mouth 4 (four) times daily as needed for cough. -     DG Chest 2 View; Future  Acute bronchitis, unspecified organism- will treat the infection with zithromax -     promethazine-dextromethorphan (PROMETHAZINE-DM) 6.25-15 MG/5ML syrup; Take 5 mLs by  mouth 4 (four) times daily as needed for cough. -     azithromycin (ZITHROMAX) 500 MG tablet; Take 1 tablet (500 mg total) by mouth daily.   I am having Mr. Pizzolato start on promethazine-dextromethorphan and azithromycin. I am also having him maintain his multivitamin, Vitamin D, aspirin, glucose blood, ALPRAZolam, metFORMIN, atorvastatin, lisinopril, fluocinonide, ezetimibe, pantoprazole, and TRUEPLUS LANCETS 30G.  Meds ordered this encounter  Medications  . TRUEPLUS LANCETS 30G MISC    Sig:     Refill:  0  . promethazine-dextromethorphan (PROMETHAZINE-DM) 6.25-15 MG/5ML syrup    Sig: Take 5 mLs by mouth 4 (four) times daily as needed for cough.    Dispense:  118 mL    Refill:  0  . azithromycin (ZITHROMAX) 500 MG tablet    Sig: Take 1 tablet (500 mg total) by mouth daily.    Dispense:  3 tablet    Refill:  0  Follow-up: Return in about 3 weeks (around 04/10/2015).  Scarlette Calico, MD

## 2015-03-20 NOTE — Patient Instructions (Signed)
Cough, Adult  A cough is a reflex that helps clear your throat and airways. It can help heal the body or may be a reaction to an irritated airway. A cough may only last 2 or 3 weeks (acute) or may last more than 8 weeks (chronic).  CAUSES Acute cough:  Viral or bacterial infections. Chronic cough:  Infections.  Allergies.  Asthma.  Post-nasal drip.  Smoking.  Heartburn or acid reflux.  Some medicines.  Chronic lung problems (COPD).  Cancer. SYMPTOMS   Cough.  Fever.  Chest pain.  Increased breathing rate.  High-pitched whistling sound when breathing (wheezing).  Colored mucus that you cough up (sputum). TREATMENT   A bacterial cough may be treated with antibiotic medicine.  A viral cough must run its course and will not respond to antibiotics.  Your caregiver may recommend other treatments if you have a chronic cough. HOME CARE INSTRUCTIONS   Only take over-the-counter or prescription medicines for pain, discomfort, or fever as directed by your caregiver. Use cough suppressants only as directed by your caregiver.  Use a cold steam vaporizer or humidifier in your bedroom or home to help loosen secretions.  Sleep in a semi-upright position if your cough is worse at night.  Rest as needed.  Stop smoking if you smoke. SEEK IMMEDIATE MEDICAL CARE IF:   You have pus in your sputum.  Your cough starts to worsen.  You cannot control your cough with suppressants and are losing sleep.  You begin coughing up blood.  You have difficulty breathing.  You develop pain which is getting worse or is uncontrolled with medicine.  You have a fever. MAKE SURE YOU:   Understand these instructions.  Will watch your condition.  Will get help right away if you are not doing well or get worse. Document Released: 12/06/2010 Document Revised: 09/01/2011 Document Reviewed: 12/06/2010 Exit[employer] Patient Information 2015 [employer]. This information is not intended  to replace advice given to you by your health care provider. Make sure you discuss any questions you have with your health care provider.  

## 2015-03-20 NOTE — Progress Notes (Signed)
Pre visit review using our clinic review tool, if applicable. No additional management support is needed unless otherwise documented below in the visit note. 

## 2015-03-21 ENCOUNTER — Encounter: Payer: Self-pay | Admitting: Internal Medicine

## 2015-03-21 NOTE — Progress Notes (Signed)
Patient ID: Dale Anderson, male   DOB: [birth year], 81 y.o.   MRN: [id number] ATTENDING PHYSICIAN NOTE: I have reviewed the chart and agree with the plan as detailed above. Dorcas Mcmurray MD Pager [phone]

## 2015-03-26 ENCOUNTER — Other Ambulatory Visit: Payer: Self-pay | Admitting: Internal Medicine

## 2015-03-27 NOTE — Telephone Encounter (Signed)
Patient calling regarding the pharmacy refill request

## 2015-03-28 NOTE — Telephone Encounter (Signed)
Ok to fill? Dr. Ronnald Ramp out of office.

## 2015-03-28 NOTE — Telephone Encounter (Signed)
Will approve 15 pills only since not filled since January this is likely more than a 1 month supply.

## 2015-03-29 ENCOUNTER — Telehealth: Payer: Self-pay | Admitting: *Deleted

## 2015-03-29 MED ORDER — BENZONATATE 100 MG PO CAPS: 100.0000 mg | ORAL_CAPSULE | Freq: Two times a day (BID) | ORAL | Status: DC | PRN

## 2015-03-29 NOTE — Telephone Encounter (Signed)
No, was given rx for 117 mL last week which should not be out yet. Rx for tessalon perles sent in to pharmacy.

## 2015-03-29 NOTE — Addendum Note (Signed)
Addended by: Pricilla Holm A on: 03/29/2015 11:11 AM   Modules accepted: Orders

## 2015-03-29 NOTE — Telephone Encounter (Signed)
Please call and let him know that it is very common for cough to linger for up to 3 weeks after the lungs are clear. If he is having nasal drainage or congestion recommend zyrtec or flonase over the counter. If still coughing in 2 weeks can call back. No further antibiotics are likely indicated.

## 2015-03-29 NOTE — Telephone Encounter (Signed)
Pt is wanting his cough syrup refill. Is this ok...Dale Anderson

## 2015-03-29 NOTE — Telephone Encounter (Signed)
Notified pt with with md response...Johny Chess

## 2015-03-29 NOTE — Telephone Encounter (Signed)
Receive call from pt wife she states that pt saw Dr. Ronnald Ramp last week. He gave him 3 pills to take (Z-pac), and which he completed, but he is not any better. He is still coughing up phlegm w/greenish color, sneezing. Wife is wanting something else call to pharmacy. MD is out of office pls advise.Marland KitchenJohny Chess

## 2015-05-04 ENCOUNTER — Other Ambulatory Visit: Payer: Self-pay

## 2015-05-04 NOTE — Telephone Encounter (Signed)
Ok to fill pended med. Last provider changed # tabs on refill bc you were out of Ov. Please advise on number to send if ok to fill

## 2015-05-05 MED ORDER — ALPRAZOLAM 0.5 MG PO TABS: 0.5000 mg | ORAL_TABLET | Freq: Every day | ORAL | Status: DC | PRN

## 2015-05-14 DIAGNOSIS — N401 Enlarged prostate with lower urinary tract symptoms: Secondary | ICD-10-CM | POA: Diagnosis not present

## 2015-05-14 DIAGNOSIS — N138 Other obstructive and reflux uropathy: Secondary | ICD-10-CM | POA: Diagnosis not present

## 2015-06-08 ENCOUNTER — Encounter: Payer: Self-pay | Admitting: Pharmacist

## 2015-06-08 ENCOUNTER — Ambulatory Visit: Payer: 59 | Admitting: Pharmacist

## 2015-06-08 ENCOUNTER — Ambulatory Visit (INDEPENDENT_AMBULATORY_CARE_PROVIDER_SITE_OTHER): Payer: Self-pay | Admitting: Family Medicine

## 2015-06-08 VITALS — BP 150/72 | Ht 71.0 in | Wt 162.2 lb

## 2015-06-08 DIAGNOSIS — E119 Type 2 diabetes mellitus without complications: Secondary | ICD-10-CM

## 2015-06-08 NOTE — Progress Notes (Signed)
Subjective:  Patient presents today for 3 month diabetes follow-up as part of the employer-sponsored Link to Wellness program.  Current diabetes regimen includes metformin 500 mg BID. Patient also continues on daily ASA, ACE Inhibitor and statin.  Most recent MD follow-up was with Dr. Ronnald Ramp in September. A1C at that visit was 7.2%.   Patient has no pending appt to see PCP. No med changes or major health changes at this time.     Assessment:  Diabetes: Most recent A1C was  7.2 % which is exceeding goal of less than 7%. Weight is decreased from last visit with me.   BP elevated this morning- patient has not taken BP meds this morning yet. I asked patient to take lisinopril when he gets home.   CBG Review: Patient brings his meter to the appointment today. He is checking a few times a week, usually fasting. Denies hypoglycemia. Majority of readings fasting are between 100-130. High/Low- 87/193   Lifestyle improvements:  Physical Activity-  He has been using the employee gym at work each day that he works. He walks on the treadmill and rides the bicycle, usually spending 30 minutes total there. 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     Nutrition-  Patient admits to eating more oranges lately and has noticed that his CBGs have been running higher. No other changes since last visit.      Follow up with me in 3 months.    Plan/Goals for Next Visit:  1. Continue physical activity.  2. Start checking blood sugar every day in the morning before you have anything to eat. Fasting goal is 80-120 mg/dL; 2 hours after you eat we want your blood sugar to be less than 180 mg/dL (even better if you can get it less than 140 mg/dL).      Next appointment to see me is: Friday March 17th at 8 AM.    T[place]. [name], PharmD, BCPS, CDE [name] to [place] Coordinator [phone]

## 2015-06-19 NOTE — Progress Notes (Signed)
I have reviewed this pharmacist's note and agree  

## 2015-06-27 MED FILL — EZETIMIBE 10 MG TABLET: 10 | 90 days supply | Qty: 90 | Fill #5

## 2015-06-29 ENCOUNTER — Other Ambulatory Visit: Payer: Self-pay

## 2015-06-29 MED ORDER — LANCETS MISC: Status: DC

## 2015-06-29 MED ORDER — GLUCOSE BLOOD VI STRP: ORAL_STRIP | Status: DC

## 2015-06-29 MED FILL — TRUE METRIX GLUCOSE TEST ST: 90 days supply | Qty: 100 | Fill #0

## 2015-06-29 MED FILL — TRUEplus LANCETS 30G MISC: 90 days supply | Qty: 100 | Fill #0

## 2015-07-25 MED FILL — ALPRAZolam 0.5 MG TABS: 0.5 | 30 days supply | Qty: 30 | Fill #1

## 2015-07-30 ENCOUNTER — Other Ambulatory Visit: Payer: Self-pay | Admitting: Internal Medicine

## 2015-07-30 MED FILL — metFORMIN HCL 500 MG TABS: 500 | 90 days supply | Qty: 180 | Fill #0

## 2015-07-30 MED FILL — PANTOPRAZOLE SOD DR 40 MG T: 40 | 90 days supply | Qty: 90 | Fill #3

## 2015-07-30 MED FILL — ATORVASTATIN 20 MG TABLET: 20 | 90 days supply | Qty: 90 | Fill #0

## 2015-07-30 MED FILL — LISINOPRIL 10 MG TABLET: 10 | 90 days supply | Qty: 90 | Fill #0

## 2015-08-21 ENCOUNTER — Other Ambulatory Visit (INDEPENDENT_AMBULATORY_CARE_PROVIDER_SITE_OTHER): Payer: 59

## 2015-08-21 ENCOUNTER — Telehealth: Payer: Self-pay | Admitting: Family

## 2015-08-21 ENCOUNTER — Ambulatory Visit (INDEPENDENT_AMBULATORY_CARE_PROVIDER_SITE_OTHER): Payer: 59 | Admitting: Family

## 2015-08-21 ENCOUNTER — Encounter: Payer: Self-pay | Admitting: Family

## 2015-08-21 VITALS — BP 128/74 | HR 80 | Temp 97.8°F | Resp 14 | Ht 71.0 in | Wt 168.4 lb

## 2015-08-21 DIAGNOSIS — R339 Retention of urine, unspecified: Secondary | ICD-10-CM

## 2015-08-21 LAB — PSA: PSA: 2.55 ng/mL (ref 0.10–4.00)

## 2015-08-21 MED ORDER — TAMSULOSIN HCL 0.4 MG PO CAPS: 0.4000 mg | ORAL_CAPSULE | Freq: Every day | ORAL | Status: DC

## 2015-08-21 MED FILL — TAMSULOSIN HCL 0.4 MG CAP: 0.4 | 30 days supply | Qty: 30 | Fill #0

## 2015-08-21 NOTE — Telephone Encounter (Signed)
Please inform patient that his PSA looks good with no evidence of infection or significant inflammation. Please follow up with Dr. Ronnald Ramp as scheduled.

## 2015-08-21 NOTE — Patient Instructions (Addendum)
Thank you for choosing [facility].  Summary/Instructions:  Your prescription(s) have been submitted to your pharmacy or been printed and provided for you. Please take as directed and contact our office if you believe you are having problem(s) with the medication(s) or have any questions.  Please stop by the lab on the basement level of the building for your blood work. Your results will be released to [facility] (or called to you) after review, usually within 72 hours after test completion. If any changes need to be made, you will be notified at that same time.  If your symptoms worsen or fail to improve, please contact our office for further instruction, or in case of emergency go directly to the emergency room at the closest medical facility.   Tamsulosin capsules What is this medicine? TAMSULOSIN (tam SOO loe sin) is used to treat enlargement of the prostate gland in men, a condition called benign prostatic hyperplasia or BPH. It is not for use in women. It works by relaxing muscles in the prostate and bladder neck. This improves urine flow and reduces BPH symptoms. This medicine may be used for other purposes; ask your health care provider or pharmacist if you have questions. What should I tell my health care provider before I take this medicine? They need to know if you have any of the following conditions: -advanced kidney disease -advanced liver disease -low blood pressure -prostate cancer -an unusual or allergic reaction to tamsulosin, sulfa drugs, other medicines, foods, dyes, or preservatives -pregnant or trying to get pregnant -breast-feeding How should I use this medicine? Take this medicine by mouth about 30 minutes after the same meal every day. Follow the directions on the prescription label. Swallow the capsules whole with a glass of water. Do not crush, chew, or open capsules. Do not take your medicine more often than directed. Do not stop taking your medicine unless  your doctor tells you to. Talk to your pediatrician regarding the use of this medicine in children. Special care may be needed. Overdosage: If you think you have taken too much of this medicine contact a poison control center or emergency room at once. NOTE: This medicine is only for you. Do not share this medicine with others. What if I miss a dose? If you miss a dose, take it as soon as you can. If it is almost time for your next dose, take only that dose. Do not take double or extra doses. If you stop taking your medicine for several days or more, ask your doctor or health care professional what dose you should start back on. What may interact with this medicine? -cimetidine -fluoxetine -ketoconazole -medicines for erectile disfunction like sildenafil, tadalafil, vardenafil -medicines for high blood pressure -other alpha-blockers like alfuzosin, doxazosin, phentolamine, phenoxybenzamine, prazosin, terazosin -warfarin This list may not describe all possible interactions. Give your health care provider a list of all the medicines, herbs, non-prescription drugs, or dietary supplements you use. Also tell them if you smoke, drink alcohol, or use illegal drugs. Some items may interact with your medicine. What should I watch for while using this medicine? Visit your doctor or health care professional for regular check ups. You will need lab work done before you start this medicine and regularly while you are taking it. Check your blood pressure as directed. Ask your health care professional what your blood pressure should be, and when you should contact him or her. This medicine may make you feel dizzy or lightheaded. This is more likely  to happen after the first dose, after an increase in dose, or during hot weather or exercise. Drinking alcohol and taking some medicines can make this worse. Do not drive, use machinery, or do anything that needs mental alertness until you know how this medicine affects  you. Do not sit or stand up quickly. If you begin to feel dizzy, sit down until you feel better. These effects can decrease once your body adjusts to the medicine. Contact your doctor or health care professional right away if you have an erection that lasts longer than 4 hours or if it becomes painful. This may be a sign of a serious problem and must be treated right away to prevent permanent damage. If you are thinking of having cataract surgery, tell your eye surgeon that you have taken this medicine. What side effects may I notice from receiving this medicine? Side effects that you should report to your doctor or health care professional as soon as possible: -allergic reactions like skin rash or itching, hives, swelling of the lips, mouth, tongue, or throat -breathing problems -change in vision -feeling faint or lightheaded -irregular heartbeat -prolonged or painful erection -weakness Side effects that usually do not require medical attention (report to your doctor or health care professional if they continue or are bothersome): -back pain -change in sex drive or performance -constipation, nausea or vomiting -cough -drowsy -runny or stuffy nose -trouble sleeping This list may not describe all possible side effects. Call your doctor for medical advice about side effects. You may report side effects to FDA at 1-800-FDA-1088. Where should I keep my medicine? Keep out of the reach of children. Store at room temperature between 15 and 30 degrees C (59 and 86 degrees F). Throw away any unused medicine after the expiration date. NOTE: This sheet is a summary. It may not cover all possible information. If you have questions about this medicine, talk to your doctor, pharmacist, or health care provider.    2016, Elsevier/Gold Standard. (2012-06-09 14:11:34)

## 2015-08-21 NOTE — Assessment & Plan Note (Signed)
In office urinalysis negative for leukocytes, nitrites and hematuria. Symptoms are consistent with BPH. Obtain PSA. Start Flomax. Follow up pending trial of medication and PSA results.

## 2015-08-21 NOTE — Progress Notes (Signed)
Pre visit review using our clinic review tool, if applicable. No additional management support is needed unless otherwise documented below in the visit note. 

## 2015-08-21 NOTE — Progress Notes (Signed)
Subjective:    Patient ID: Dale Anderson, male    DOB: 06-18-1930, 82 y.o.   MRN: [id number]  Chief Complaint  Patient presents with  . Neck Pain    states his neck is doing better, wants to talk about getting something for urination    HPI:  Dale Anderson is a 82 y.o. male who  has a past medical history of Recurrent bronchiectasis ([place]); Hypertension; Hyperlipidemia; Diabetes mellitus ([place]); GERD (gastroesophageal reflux disease); Hemorrhoid; Nodular prostate without urinary obstruction; DJD (degenerative joint disease); Anxiety; History of shingles; Tear of right rotator cuff; and Adhesive capsulitis of right shoulder. and presents today for an office visit.   1.)  Urinary issues - Associated symptom of "not passing the urine" like he should and has difficulty starting a stream. This is not constant and waxes and wanes. He expresses nocturia having to go 3-4 times per night. Denies dysuria, urgency or fevers. Previously maintained on Floxmax but has not taken it in several years.   No Known Allergies   Current Outpatient Prescriptions on File Prior to Visit  Medication Sig Dispense Refill  . ALPRAZolam (XANAX) 0.5 MG tablet Take 1 tablet (0.5 mg total) by mouth daily as needed for anxiety. 30 tablet 3  . aspirin (ASPIRIN LOW DOSE) 81 MG EC tablet TAKE 1 TABLET BY MOUTH EVERY DAY 90 tablet 3  . atorvastatin (LIPITOR) 20 MG tablet TAKE 1 TABLET BY MOUTH ONCE DAILY 90 tablet 1  . Cholecalciferol (VITAMIN D) 1000 UNITS capsule Take 1,000 Units by mouth daily.      Marland Kitchen ezetimibe (ZETIA) 10 MG tablet Take 1 tablet (10 mg total) by mouth daily. 90 tablet 3  . fluocinonide (LIDEX) 0.05 % external solution APPLY TO AFFECTED AREA AS DIRECTED 60 mL 2  . glucose blood (TRUETRACK TEST) test strip TEST BLOOD SUGAR ONCE DAILY. DX E11.09 100 each 3  . Lancets MISC Use to test blood sugar once daily. DX E11.09 100 each 3  . lisinopril (PRINIVIL,ZESTRIL) 10 MG tablet TAKE 1 TABLET BY MOUTH  ONCE DAILY 90 tablet 1  . metFORMIN (GLUCOPHAGE) 500 MG tablet TAKE 1 TABLET BY MOUTH TWICE DAILY 180 tablet 1  . Multiple Vitamin (MULTIVITAMIN) capsule Take 1 capsule by mouth daily.      . pantoprazole (PROTONIX) 40 MG tablet TAKE 1 TABLET BY MOUTH DAILY. 90 tablet PRN   No current facility-administered medications on file prior to visit.     Review of Systems  Constitutional: Negative for fever and chills.  Genitourinary: Positive for frequency and difficulty urinating. Negative for dysuria, urgency, hematuria, flank pain and penile pain.      Objective:    BP 128/74 mmHg  Pulse 80  Temp(Src) 97.8 F (36.6 C) (Oral)  Resp 14  Ht 5\' 11"  (1.803 m)  Wt 168 lb 6.4 oz (76.386 kg)  BMI 23.50 kg/m2  SpO2 94% Nursing note and vital signs reviewed.  Physical Exam  Constitutional: He is oriented to person, place, and time. He appears well-developed and well-nourished. No distress.  Cardiovascular: Normal rate, regular rhythm, normal heart sounds and intact distal pulses.   Pulmonary/Chest: Effort normal and breath sounds normal.  Neurological: He is alert and oriented to person, place, and time.  Skin: Skin is warm and dry.  Psychiatric: He has a normal mood and affect. His behavior is normal. Judgment and thought content normal.       Assessment & Plan:   Problem List Items Addressed This Visit  Genitourinary   Urinary retention - Primary    In office urinalysis negative for leukocytes, nitrites and hematuria. Symptoms are consistent with BPH. Obtain PSA. Start Flomax. Follow up pending trial of medication and PSA results.       Relevant Medications   tamsulosin (FLOMAX) 0.4 MG CAPS capsule   Other Relevant Orders   POCT urinalysis dipstick   PSA

## 2015-08-23 NOTE — Telephone Encounter (Signed)
LVM letting pt know.  

## 2015-09-07 ENCOUNTER — Ambulatory Visit (INDEPENDENT_AMBULATORY_CARE_PROVIDER_SITE_OTHER): Payer: Self-pay | Admitting: Family Medicine

## 2015-09-07 ENCOUNTER — Encounter: Payer: Self-pay | Admitting: Pharmacist

## 2015-09-07 VITALS — BP 144/68 | Ht 71.0 in | Wt 172.0 lb

## 2015-09-07 DIAGNOSIS — E119 Type 2 diabetes mellitus without complications: Secondary | ICD-10-CM

## 2015-09-07 NOTE — Progress Notes (Signed)
Subjective:  Patient presents today for 3 month diabetes follow-up as part of the employer-sponsored Link to Wellness program.  Current diabetes regimen includes metformin 500 mg BID. Patient also continues on daily ASA, ACE Inhibitor and statin.  Most recent MD follow-up was with Dr. Ronnald Ramp in September of last year. Patient has no pending appt to see him.   Medication changes- he has started tamsulosin 0.4 mg daily for BPH. He has been taking this for a few weeks and states that it has been helping.    Assessment:  Diabetes: Most recent A1C was 7.4  % which is exceeding goal of less than 7%. Weight is increased from last visit with me. He reports that he has been eating out more lately and this is why his weight it up (about 10 pounds since last visit). He is still at a healthy BMI of 24.0.    CBG Review:  Patient reports that he is checking his blood sugar on average about 4 times a week. He is checking fasting in the morning. This is an improvement for the patient as he has struggled with checking in the past.   CBG averages are about 10-15 points improved from last visit.   Denies hypoglycemia.   High/Low- 86/150 Lifestyle improvements:  Physical Activity-  He states that he is walking on the treadmill 15-20 minutes in the morning when he is working or on his lunch break. He is also walking about 45 minutes on the treadmill daily at home.    Nutrition-  He reports that he has been eating out more and he can feel like his weight is up. He states that when he doesn't eat out and he is working his portion sizes are smaller and he just doesn't eat as much. While patient is still at a healthy BMI, I encouraged him to cut back on eating out and return to his previous eating habits.    Follow up with me in 3 months.   I will send A1C results to Dr. Ronnald Ramp.   Plan/Goals for Next Visit:  1. Cut back on eating out and limit to once or zero times during the week.  2. Make an appointment  to see Dr. Ronnald Ramp for a check up.  3. Continue physical activity.  4. Continue checking your blood sugar once daily in the morning.     Next appointment to see me is: Friday June 16th at [place] [facility]. [name], PharmD, BCPS, CDE [name] to [place] Coordinator [phone]

## 2015-09-25 ENCOUNTER — Ambulatory Visit (INDEPENDENT_AMBULATORY_CARE_PROVIDER_SITE_OTHER): Payer: 59 | Admitting: Internal Medicine

## 2015-09-25 ENCOUNTER — Encounter: Payer: Self-pay | Admitting: Internal Medicine

## 2015-09-25 VITALS — BP 124/60 | HR 91 | Temp 98.4°F | Resp 20 | Wt 168.0 lb

## 2015-09-25 DIAGNOSIS — I10 Essential (primary) hypertension: Secondary | ICD-10-CM | POA: Diagnosis not present

## 2015-09-25 DIAGNOSIS — R05 Cough: Secondary | ICD-10-CM

## 2015-09-25 DIAGNOSIS — R059 Cough, unspecified: Secondary | ICD-10-CM

## 2015-09-25 DIAGNOSIS — E118 Type 2 diabetes mellitus with unspecified complications: Secondary | ICD-10-CM | POA: Diagnosis not present

## 2015-09-25 MED ORDER — LEVOFLOXACIN 250 MG PO TABS: 250.0000 mg | ORAL_TABLET | Freq: Every day | ORAL | Status: DC

## 2015-09-25 MED ORDER — HYDROCODONE-HOMATROPINE 5-1.5 MG/5ML PO SYRP: 5.0000 mL | ORAL_SOLUTION | Freq: Four times a day (QID) | ORAL | Status: DC | PRN

## 2015-09-25 NOTE — Progress Notes (Signed)
Pre visit review using our clinic review tool, if applicable. No additional management support is needed unless otherwise documented below in the visit note. 

## 2015-09-25 NOTE — Patient Instructions (Signed)
Please take all new medication as prescribed - the antibiotic, and cough medicine if needed  Please continue all other medications as before, and refills have been done if requested.  Please have the pharmacy call with any other refills you may need.  Please keep your appointments with your specialists as you may have planned     

## 2015-09-26 NOTE — Progress Notes (Signed)
Subjective:    Patient ID: Dale Anderson, male    DOB: [birth year], 82 y.o.   MRN: [id number]  HPI  Here with acute onset mild to mod 2-3 days ST, HA, general weakness and malaise, with prod cough greenish sputum, but Pt denies chest pain, increased sob or doe, wheezing, orthopnea, PND, increased LE swelling, palpitations, dizziness or syncope.  Pt denies new neurological symptoms such as new headache, or facial or extremity weakness or numbness   Pt denies polydipsia, polyuria,  Past Medical History  Diagnosis Date  . Recurrent bronchiectasis ([place])   . Hypertension   . Hyperlipidemia   . Diabetes mellitus ([place])   . GERD (gastroesophageal reflux disease)   . Hemorrhoid   . Nodular prostate without urinary obstruction   . DJD (degenerative joint disease)   . Anxiety   . History of shingles   . Tear of right rotator cuff   . Adhesive capsulitis of right shoulder    Past Surgical History  Procedure Laterality Date  . Rotator cuff repair  1995    Left.  Dr. Wonda Olds  . Inguinal hernia repair  2006    Right.  Dr. Bubba Camp  . Colonoscopy  1998  . Hand reconstruction      right  . Shoulder arthroscopy with rotator cuff repair and subacromial decompression  05/25/2012    Procedure: SHOULDER ARTHROSCOPY WITH ROTATOR CUFF REPAIR AND SUBACROMIAL DECOMPRESSION;  Surgeon: Lorn Junes, MD;  Location: [facility];  Service: Orthopedics;  Laterality: Right;  Right Shoulder Arthroscopy shoulder decompression subacromial partial acromioplasty with coracoaromial release, arthroscopy shoulder distal claviculectomy, arthroscopy shoulder with rotator cuff repai    reports that he has never smoked. He has never used smokeless tobacco. He reports that he does not drink alcohol or use illicit drugs. family history includes Emphysema in his other; Heart disease in his father. There is no history of Colon cancer. No Known Allergies Current Outpatient Prescriptions on File Prior to Visit    Medication Sig Dispense Refill  . ALPRAZolam (XANAX) 0.5 MG tablet Take 1 tablet (0.5 mg total) by mouth daily as needed for anxiety. 30 tablet 3  . aspirin (ASPIRIN LOW DOSE) 81 MG EC tablet TAKE 1 TABLET BY MOUTH EVERY DAY 90 tablet 3  . atorvastatin (LIPITOR) 20 MG tablet TAKE 1 TABLET BY MOUTH ONCE DAILY 90 tablet 1  . Cholecalciferol (VITAMIN D) 1000 UNITS capsule Take 1,000 Units by mouth daily.      Marland Kitchen ezetimibe (ZETIA) 10 MG tablet Take 1 tablet (10 mg total) by mouth daily. 90 tablet 3  . fluocinonide (LIDEX) 0.05 % external solution APPLY TO AFFECTED AREA AS DIRECTED 60 mL 2  . glucose blood (TRUETRACK TEST) test strip TEST BLOOD SUGAR ONCE DAILY. DX E11.09 100 each 3  . Lancets MISC Use to test blood sugar once daily. DX E11.09 100 each 3  . lisinopril (PRINIVIL,ZESTRIL) 10 MG tablet TAKE 1 TABLET BY MOUTH ONCE DAILY 90 tablet 1  . metFORMIN (GLUCOPHAGE) 500 MG tablet TAKE 1 TABLET BY MOUTH TWICE DAILY 180 tablet 1  . Multiple Vitamin (MULTIVITAMIN) capsule Take 1 capsule by mouth daily.      . pantoprazole (PROTONIX) 40 MG tablet TAKE 1 TABLET BY MOUTH DAILY. 90 tablet PRN  . tamsulosin (FLOMAX) 0.4 MG CAPS capsule Take 1 capsule (0.4 mg total) by mouth daily. 30 capsule 2   No current facility-administered medications on file prior to visit.   Review of Systems  Constitutional:  Negative for unusual diaphoresis or night sweats HENT: Negative for ear swelling or discharge Eyes: Negative for worsening visual haziness  Respiratory: Negative for choking and stridor.   Gastrointestinal: Negative for distension or worsening eructation Genitourinary: Negative for retention or change in urine volume.  Musculoskeletal: Negative for other MSK pain or swelling Skin: Negative for color change and worsening wound Neurological: Negative for tremors and numbness other than noted  Psychiatric/Behavioral: Negative for decreased concentration or agitation other than above       Objective:    Physical Exam BP 124/60 mmHg  Pulse 91  Temp(Src) 98.4 F (36.9 C) (Oral)  Resp 20  Wt 168 lb (76.204 kg)  SpO2 97% VS noted, mild ill Constitutional: Pt appears in no apparent distress HENT: Head: NCAT.  Right Ear: External ear normal.  Left Ear: External ear normal.  Bilat tm's with mild erythema.  Max sinus areas non tender.  Pharynx with mild erythema, no exudate Eyes: . Pupils are equal, round, and reactive to light. Conjunctivae and EOM are normal Neck: Normal range of motion. Neck supple.  Cardiovascular: Normal rate and regular rhythm.   Pulmonary/Chest: Effort normal and breath sounds without rales or wheezing.  Neurological: Pt is alert. Not confused , motor grossly intact Skin: Skin is warm. No rash, no LE edema Psychiatric: Pt behavior is normal. No agitation.     Assessment & Plan:

## 2015-09-26 NOTE — Assessment & Plan Note (Signed)
stable overall by history and exam, recent data reviewed with pt, and pt to continue medical treatment as before,  to f/u any worsening symptoms or concerns BP Readings from Last 3 Encounters:  09/25/15 124/60  09/07/15 144/68  08/21/15 128/74

## 2015-09-26 NOTE — Assessment & Plan Note (Signed)
Mild to mod, c/s bronchitisi vs  CAP, for antibx course, cough med prn, declines cxr, to f/u any worsening symptoms or concerns

## 2015-09-26 NOTE — Assessment & Plan Note (Signed)
stable overall by history and exam, recent data reviewed with pt, and pt to continue medical treatment as before,  to f/u any worsening symptoms or concerns Lab Results  Component Value Date   HGBA1C 7.2* 03/20/2015

## 2015-10-04 NOTE — Progress Notes (Signed)
I have reviewed this pharmacist's note and agree  

## 2015-10-15 ENCOUNTER — Encounter: Payer: Self-pay | Admitting: Internal Medicine

## 2015-10-15 ENCOUNTER — Other Ambulatory Visit (INDEPENDENT_AMBULATORY_CARE_PROVIDER_SITE_OTHER): Payer: 59

## 2015-10-15 ENCOUNTER — Ambulatory Visit (INDEPENDENT_AMBULATORY_CARE_PROVIDER_SITE_OTHER): Payer: 59 | Admitting: Internal Medicine

## 2015-10-15 VITALS — BP 130/78 | HR 61 | Temp 97.7°F | Resp 16 | Ht 71.0 in | Wt 168.0 lb

## 2015-10-15 DIAGNOSIS — K219 Gastro-esophageal reflux disease without esophagitis: Secondary | ICD-10-CM

## 2015-10-15 DIAGNOSIS — I10 Essential (primary) hypertension: Secondary | ICD-10-CM

## 2015-10-15 DIAGNOSIS — E785 Hyperlipidemia, unspecified: Secondary | ICD-10-CM

## 2015-10-15 DIAGNOSIS — E118 Type 2 diabetes mellitus with unspecified complications: Secondary | ICD-10-CM | POA: Diagnosis not present

## 2015-10-15 LAB — COMPREHENSIVE METABOLIC PANEL
ALBUMIN: 4.1 g/dL (ref 3.5–5.2)
ALT: 25 U/L (ref 0–53)
AST: 21 U/L (ref 0–37)
Alkaline Phosphatase: 58 U/L (ref 39–117)
BILIRUBIN TOTAL: 0.8 mg/dL (ref 0.2–1.2)
BUN: 27 mg/dL — AB (ref 6–23)
CHLORIDE: 108 meq/L (ref 96–112)
CO2: 27 mEq/L (ref 19–32)
CREATININE: 1.26 mg/dL (ref 0.40–1.50)
Calcium: 9.4 mg/dL (ref 8.4–10.5)
GFR: 70.22 mL/min (ref >60.00)
Glucose, Bld: 110 mg/dL — ABNORMAL HIGH (ref 70–99)
Potassium: 4.4 mEq/L (ref 3.5–5.1)
SODIUM: 141 meq/L (ref 135–145)
TOTAL PROTEIN: 7.2 g/dL (ref 6.0–8.3)

## 2015-10-15 LAB — LIPID PANEL
CHOL/HDL RATIO: 2
CHOLESTEROL: 103 mg/dL (ref 0–200)
HDL: 46.6 mg/dL (ref >39.00)
LDL Cholesterol: 40 mg/dL (ref 0–99)
NonHDL: 56.22
TRIGLYCERIDES: 81 mg/dL (ref 0.0–149.0)
VLDL: 16.2 mg/dL (ref 0.0–40.0)

## 2015-10-15 LAB — URINALYSIS, ROUTINE W REFLEX MICROSCOPIC
Bilirubin Urine: NEGATIVE
HGB URINE DIPSTICK: NEGATIVE
Ketones, ur: NEGATIVE
Leukocytes, UA: NEGATIVE
NITRITE: NEGATIVE
RBC / HPF: NONE SEEN (ref 0–?)
Specific Gravity, Urine: 1.025 (ref 1.000–1.030)
Total Protein, Urine: NEGATIVE
URINE GLUCOSE: NEGATIVE
Urobilinogen, UA: 0.2 (ref 0.0–1.0)
WBC UA: NONE SEEN (ref 0–?)
pH: 5.5 (ref 5.0–8.0)

## 2015-10-15 LAB — CBC WITH DIFFERENTIAL/PLATELET
BASOS ABS: 0 10*3/uL (ref 0.0–0.1)
BASOS PCT: 0.2 % (ref 0.0–3.0)
EOS ABS: 0.2 10*3/uL (ref 0.0–0.7)
Eosinophils Relative: 2.8 % (ref 0.0–5.0)
HCT: 34.7 % — ABNORMAL LOW (ref 39.0–52.0)
HEMOGLOBIN: 11.8 g/dL — AB (ref 13.0–17.0)
Lymphocytes Relative: 25.7 % (ref 12.0–46.0)
Lymphs Abs: 1.4 10*3/uL (ref 0.7–4.0)
MCHC: 34.1 g/dL (ref 30.0–36.0)
MCV: 90.1 fl (ref 78.0–100.0)
MONO ABS: 0.5 10*3/uL (ref 0.1–1.0)
Monocytes Relative: 9 % (ref 3.0–12.0)
NEUTROS ABS: 3.4 10*3/uL (ref 1.4–7.7)
Neutrophils Relative %: 62.3 % (ref 43.0–77.0)
PLATELETS: 214 10*3/uL (ref 150.0–400.0)
RBC: 3.85 Mil/uL — ABNORMAL LOW (ref 4.22–5.81)
RDW: 14 % (ref 11.5–15.5)
WBC: 5.5 10*3/uL (ref 4.0–10.5)

## 2015-10-15 LAB — MICROALBUMIN / CREATININE URINE RATIO
Creatinine,U: 128.7 mg/dL
MICROALB UR: 2.6 mg/dL — AB (ref 0.0–1.9)
Microalb Creat Ratio: 2 mg/g (ref 0.0–30.0)

## 2015-10-15 LAB — HEMOGLOBIN A1C: HEMOGLOBIN A1C: 7.5 % — AB (ref 4.6–6.5)

## 2015-10-15 LAB — TSH: TSH: 1.89 u[IU]/mL (ref 0.35–4.50)

## 2015-10-15 MED ORDER — TELMISARTAN 40 MG PO TABS: 40.0000 mg | ORAL_TABLET | Freq: Every day | ORAL | Status: DC

## 2015-10-15 MED FILL — TELMISARTAN 40 MG TABLET: 40 | 90 days supply | Qty: 90 | Fill #0

## 2015-10-15 NOTE — Progress Notes (Signed)
Subjective:  Patient ID: Dale Anderson, male    DOB: [birth year]  Age: 82 y.o. MRN: [id number]  CC: Hypertension; Hyperlipidemia; and Diabetes   HPI Dale Anderson presents for follow-up -  He tells me that his blood pressure has been well controlled on lisinopril. He complains of intermittent nonproductive cough, runny nose, and constant throat clearing. He is doing well on his cholesterol medicine with no muscle or joint aches. He feels like his blood sugars up and well controlled as he has had no visual disturbance, polyuria, polydipsia, or polyphagia.  Outpatient Prescriptions Prior to Visit  Medication Sig Dispense Refill  . ALPRAZolam (XANAX) 0.5 MG tablet Take 1 tablet (0.5 mg total) by mouth daily as needed for anxiety. 30 tablet 3  . aspirin (ASPIRIN LOW DOSE) 81 MG EC tablet TAKE 1 TABLET BY MOUTH EVERY DAY 90 tablet 3  . atorvastatin (LIPITOR) 20 MG tablet TAKE 1 TABLET BY MOUTH ONCE DAILY 90 tablet 1  . Cholecalciferol (VITAMIN D) 1000 UNITS capsule Take 1,000 Units by mouth daily.      Marland Kitchen ezetimibe (ZETIA) 10 MG tablet Take 1 tablet (10 mg total) by mouth daily. 90 tablet 3  . fluocinonide (LIDEX) 0.05 % external solution APPLY TO AFFECTED AREA AS DIRECTED 60 mL 2  . glucose blood (TRUETRACK TEST) test strip TEST BLOOD SUGAR ONCE DAILY. DX E11.09 100 each 3  . Lancets MISC Use to test blood sugar once daily. DX E11.09 100 each 3  . metFORMIN (GLUCOPHAGE) 500 MG tablet TAKE 1 TABLET BY MOUTH TWICE DAILY 180 tablet 1  . Multiple Vitamin (MULTIVITAMIN) capsule Take 1 capsule by mouth daily.      . pantoprazole (PROTONIX) 40 MG tablet TAKE 1 TABLET BY MOUTH DAILY. 90 tablet PRN  . tamsulosin (FLOMAX) 0.4 MG CAPS capsule Take 1 capsule (0.4 mg total) by mouth daily. 30 capsule 2  . HYDROcodone-homatropine (HYCODAN) 5-1.5 MG/5ML syrup Take 5 mLs by mouth every 6 (six) hours as needed for cough. 180 mL 0  . lisinopril (PRINIVIL,ZESTRIL) 10 MG tablet TAKE 1 TABLET BY MOUTH  ONCE DAILY 90 tablet 1  . levofloxacin (LEVAQUIN) 250 MG tablet Take 1 tablet (250 mg total) by mouth daily. 10 tablet 0   No facility-administered medications prior to visit.    ROS Review of Systems  Constitutional: Negative.  Negative for fever, chills, diaphoresis, appetite change and fatigue.  HENT: Positive for postnasal drip and rhinorrhea. Negative for sinus pressure, sneezing and sore throat.   Eyes: Negative.   Respiratory: Positive for cough. Negative for choking, chest tightness, shortness of breath and stridor.   Cardiovascular: Negative.  Negative for chest pain, palpitations and leg swelling.  Gastrointestinal: Negative.  Negative for nausea, vomiting, abdominal pain, diarrhea and constipation.  Endocrine: Negative.  Negative for polydipsia, polyphagia and polyuria.  Genitourinary: Negative.  Negative for urgency, hematuria, flank pain, decreased urine volume and difficulty urinating.  Musculoskeletal: Negative.  Negative for myalgias, back pain, joint swelling and arthralgias.  Skin: Negative.  Negative for color change and rash.  Allergic/Immunologic: Negative.   Neurological: Negative.  Negative for dizziness, tremors, weakness, light-headedness and numbness.  Hematological: Negative.  Negative for adenopathy. Does not bruise/bleed easily.  Psychiatric/Behavioral: Negative.     Objective:  BP 130/78 mmHg  Pulse 61  Temp(Src) 97.7 F (36.5 C) (Oral)  Resp 16  Ht 5\' 11"  (1.803 m)  Wt 168 lb (76.204 kg)  BMI 23.44 kg/m2  SpO2 92%  BP Readings from Last  3 Encounters:  10/15/15 130/78  09/25/15 124/60  09/07/15 144/68    Wt Readings from Last 3 Encounters:  10/15/15 168 lb (76.204 kg)  09/25/15 168 lb (76.204 kg)  09/07/15 172 lb (78.019 kg)    Physical Exam  Constitutional: He is oriented to person, place, and time. No distress.  HENT:  Mouth/Throat: Oropharynx is clear and moist. No oropharyngeal exudate.  Eyes: Conjunctivae are normal. Right eye  exhibits no discharge. Left eye exhibits no discharge. No scleral icterus.  Neck: Normal range of motion. Neck supple. No JVD present. No tracheal deviation present. No thyromegaly present.  Cardiovascular: Normal rate, regular rhythm, normal heart sounds and intact distal pulses.  Exam reveals no gallop and no friction rub.   No murmur heard. Pulmonary/Chest: Effort normal and breath sounds normal. No stridor. No respiratory distress. He has no wheezes. He has no rales. He exhibits no tenderness.  Abdominal: Soft. Bowel sounds are normal. He exhibits no distension and no mass. There is no tenderness. There is no rebound and no guarding.  Musculoskeletal: Normal range of motion. He exhibits no edema or tenderness.  Lymphadenopathy:    He has no cervical adenopathy.  Neurological: He is oriented to person, place, and time.  Skin: Skin is warm and dry. No rash noted. He is not diaphoretic. No erythema. No pallor.  Psychiatric: He has a normal mood and affect. His behavior is normal. Judgment and thought content normal.  Vitals reviewed.   Lab Results  Component Value Date   WBC 5.5 10/15/2015   HGB 11.8* 10/15/2015   HCT 34.7* 10/15/2015   PLT 214.0 10/15/2015   GLUCOSE 110* 10/15/2015   CHOL 103 10/15/2015   TRIG 81.0 10/15/2015   HDL 46.60 10/15/2015   LDLCALC 40 10/15/2015   ALT 25 10/15/2015   AST 21 10/15/2015   NA 141 10/15/2015   K 4.4 10/15/2015   CL 108 10/15/2015   CREATININE 1.26 10/15/2015   BUN 27* 10/15/2015   CO2 27 10/15/2015   TSH 1.89 10/15/2015   PSA 2.55 08/21/2015   HGBA1C 7.5* 10/15/2015   MICROALBUR 2.6* 10/15/2015    Dg Chest 2 View  03/20/2015  CLINICAL DATA:  Four day history of cough and congestion EXAM: CHEST  2 VIEW COMPARISON:  September 12, 2013 FINDINGS: Rounded opacities in each lung base are suggestive of nipple shadows. There is no edema or consolidation. Heart size and pulmonary vascularity are normal. No adenopathy. There is degenerative change  in the thoracic spine. IMPRESSION: Probable nipple shadows on each side. Advise repeat study with nipple markers to confirm. No edema or consolidation. Electronically Signed   By: Lowella Grip III M.D.   On: 03/20/2015 15:20    Assessment & Plan:   Song was seen today for hypertension, hyperlipidemia and diabetes.  Diagnoses and all orders for this visit:  Essential hypertension- I'm concerned that he has some symptoms related to ACE inhibitor therapy so I've asked him to stop taking lisinopril and will control his blood pressure with telmisartan. -     Urinalysis, Routine w reflex microscopic (not at [facility]); Future -     Comprehensive metabolic panel; Future -     telmisartan (MICARDIS) 40 MG tablet; Take 1 tablet (40 mg total) by mouth daily.  Type 2 diabetes mellitus with complication, without long-term current use of insulin ([place])- his A1c is up to 7.5% which is a slight increase but is still relatively well controlled, will continue metformin at the current dose.  Headache he is having side effects to the ACE inhibitor so we'll change to an ARB for renal protection. -     Microalbumin / creatinine urine ratio; Future -     Comprehensive metabolic panel; Future -     Hemoglobin A1c; Future -     telmisartan (MICARDIS) 40 MG tablet; Take 1 tablet (40 mg total) by mouth daily.  Hyperlipidemia with target LDL less than 100- he is doing well on the statin and his achieved his LDL goal. -     Lipid panel; Future -     TSH; Future -     Comprehensive metabolic panel; Future  Gastroesophageal reflux disease without esophagitis- symptoms are well controlled with a proton pump inhibitor and there are no alarm features. -     CBC with Differential/Platelet; Future  I have discontinued Mr. Treadwell's lisinopril, levofloxacin, and HYDROcodone-homatropine. I am also having him start on telmisartan. Additionally, I am having him maintain his multivitamin, Vitamin D, aspirin, fluocinonide,  ezetimibe, pantoprazole, ALPRAZolam, glucose blood, Lancets, atorvastatin, metFORMIN, and tamsulosin.  Meds ordered this encounter  Medications  . telmisartan (MICARDIS) 40 MG tablet    Sig: Take 1 tablet (40 mg total) by mouth daily.    Dispense:  90 tablet    Refill:  3     Follow-up: Return in about 4 months (around 02/14/2016).  Scarlette Calico, MD

## 2015-10-15 NOTE — Progress Notes (Signed)
Pre visit review using our clinic review tool, if applicable. No additional management support is needed unless otherwise documented below in the visit note. 

## 2015-10-15 NOTE — Patient Instructions (Signed)
Hypertension Hypertension, commonly called high blood pressure, is when the force of blood pumping through your arteries is too strong. Your arteries are the blood vessels that carry blood from your heart throughout your body. A blood pressure reading consists of a higher number over a lower number, such as 110/72. The higher number (systolic) is the pressure inside your arteries when your heart pumps. The lower number (diastolic) is the pressure inside your arteries when your heart relaxes. Ideally you want your blood pressure below 120/80. Hypertension forces your heart to work harder to pump blood. Your arteries may become narrow or stiff. Having untreated or uncontrolled hypertension can cause heart attack, stroke, kidney disease, and other problems. RISK FACTORS Some risk factors for high blood pressure are controllable. Others are not.  Risk factors you cannot control include:   Race. You may be at higher risk if you are African American.  Age. Risk increases with age.  Gender. Men are at higher risk than women before age 45 years. After age 65, women are at higher risk than men. Risk factors you can control include:  Not getting enough exercise or physical activity.  Being overweight.  Getting too much fat, sugar, calories, or salt in your diet.  Drinking too much alcohol. SIGNS AND SYMPTOMS Hypertension does not usually cause signs or symptoms. Extremely high blood pressure (hypertensive crisis) may cause headache, anxiety, shortness of breath, and nosebleed. DIAGNOSIS To check if you have hypertension, your health care provider will measure your blood pressure while you are seated, with your arm held at the level of your heart. It should be measured at least twice using the same arm. Certain conditions can cause a difference in blood pressure between your right and left arms. A blood pressure reading that is higher than normal on one occasion does not mean that you need treatment. If  it is not clear whether you have high blood pressure, you may be asked to return on a different day to have your blood pressure checked again. Or, you may be asked to monitor your blood pressure at home for 1 or more weeks. TREATMENT Treating high blood pressure includes making lifestyle changes and possibly taking medicine. Living a healthy lifestyle can help lower high blood pressure. You may need to change some of your habits. Lifestyle changes may include:  Following the DASH diet. This diet is high in fruits, vegetables, and whole grains. It is low in salt, red meat, and added sugars.  Keep your sodium intake below 2,300 mg per day.  Getting at least 30-45 minutes of aerobic exercise at least 4 times per week.  Losing weight if necessary.  Not smoking.  Limiting alcoholic beverages.  Learning ways to reduce stress. Your health care provider may prescribe medicine if lifestyle changes are not enough to get your blood pressure under control, and if one of the following is true:  You are 18-59 years of age and your systolic blood pressure is above 140.  You are 60 years of age or older, and your systolic blood pressure is above 150.  Your diastolic blood pressure is above 90.  You have diabetes, and your systolic blood pressure is over 140 or your diastolic blood pressure is over 90.  You have kidney disease and your blood pressure is above 140/90.  You have heart disease and your blood pressure is above 140/90. Your personal target blood pressure may vary depending on your medical conditions, your age, and other factors. HOME CARE INSTRUCTIONS    Have your blood pressure rechecked as directed by your health care provider.   Take medicines only as directed by your health care provider. Follow the directions carefully. Blood pressure medicines must be taken as prescribed. The medicine does not work as well when you skip doses. Skipping doses also puts you at risk for  problems.  Do not smoke.   Monitor your blood pressure at home as directed by your health care provider. SEEK MEDICAL CARE IF:   You think you are having a reaction to medicines taken.  You have recurrent headaches or feel dizzy.  You have swelling in your ankles.  You have trouble with your vision. SEEK IMMEDIATE MEDICAL CARE IF:  You develop a severe headache or confusion.  You have unusual weakness, numbness, or feel faint.  You have severe chest or abdominal pain.  You vomit repeatedly.  You have trouble breathing. MAKE SURE YOU:   Understand these instructions.  Will watch your condition.  Will get help right away if you are not doing well or get worse.   This information is not intended to replace advice given to you by your health care provider. Make sure you discuss any questions you have with your health care provider.   Document Released: 06/09/2005 Document Revised: 10/24/2014 Document Reviewed: 04/01/2013 Elsevier Interactive Patient Education 2016 Elsevier Inc.  

## 2015-10-16 ENCOUNTER — Encounter: Payer: Self-pay | Admitting: Internal Medicine

## 2015-10-16 MED FILL — ALPRAZolam 0.5 MG TABS: 0.5 | 30 days supply | Qty: 30 | Fill #2

## 2015-10-16 NOTE — Progress Notes (Signed)
A user error has taken place.

## 2015-10-17 ENCOUNTER — Other Ambulatory Visit: Payer: Self-pay | Admitting: Internal Medicine

## 2015-10-17 MED FILL — EZETIMIBE 10 MG TABLET: 10 | 90 days supply | Qty: 90 | Fill #0

## 2015-10-26 ENCOUNTER — Other Ambulatory Visit: Payer: Self-pay | Admitting: Internal Medicine

## 2015-10-26 MED FILL — PANTOPRAZOLE SOD DR 40 MG T: 40 | 90 days supply | Qty: 90 | Fill #0

## 2015-11-15 MED FILL — metFORMIN HCL 500 MG TABS: 500 | 90 days supply | Qty: 180 | Fill #1

## 2015-11-15 MED FILL — ATORVASTATIN 20 MG TABLET: 20 | 90 days supply | Qty: 90 | Fill #1

## 2015-12-07 ENCOUNTER — Encounter: Payer: Self-pay | Admitting: Pharmacist

## 2015-12-07 ENCOUNTER — Other Ambulatory Visit: Payer: Self-pay | Admitting: Pharmacist

## 2015-12-07 VITALS — BP 134/72 | Wt 164.0 lb

## 2015-12-07 DIAGNOSIS — E119 Type 2 diabetes mellitus without complications: Secondary | ICD-10-CM

## 2015-12-07 NOTE — Patient Outreach (Signed)
Subjective:  Patient presents today for 3 month diabetes follow-up as part of the employer-sponsored Link to Wellness program.  Current diabetes regimen includes metformin 500mg  daily. Patient also continues on daily ASA, ACE Inhibitor and statin.  Most recent MD follow-up was Apr 2017. Patient does not have a pending appt. No major health changes at this time.  States he has been trying to eat out less overall. Still feels he needs to improve. Recently on vacation in [place] and admits to not eating properly. Gravy biscuit daily for breakfast and drinking skim milk at least 3x per day. Rarely drinks water. Exercising regularly. 35-40 min on treadmill everyday. Often goes to gym at work as well.    Medication Changes: Lisinopril switched to telmisartan at last PCP visit due to cough  Assessment:  Diabetes: Most recent A1C was 7.5% which is above goal of <7%. Weight is decreased ~8lbs from last visit. Doing very well in regard to physical activity. Eating out frequently and dietary indiscretion continues to be a large contributor to deteriorating BG control.   CBG Review: Checking BG 3-4x week in the morning. Did not bring meter to visit.   BG: High 100-110, low 90-99    Plan/Goals for Next Visit:  1. Cut down eating out to twice a week. Gravy biscuits once a week only.  2. Drink milk once a day at most. Try to increase water intake. Goal of eight 8 oz. glasses per day. 3. Continue checking blood sugar 4. Continue to exercise 30-45 minutes at least 5 days per week.    Next LTW appointment is August 4 at [facility], PharmD Clinical Pharmacy Resident 8:39 AM, 12/07/2015

## 2016-01-17 DIAGNOSIS — M25531 Pain in right wrist: Secondary | ICD-10-CM | POA: Diagnosis not present

## 2016-01-17 DIAGNOSIS — M654 Radial styloid tenosynovitis [de Quervain]: Secondary | ICD-10-CM | POA: Diagnosis not present

## 2016-01-22 ENCOUNTER — Other Ambulatory Visit: Payer: Self-pay | Admitting: Pulmonary Disease

## 2016-01-22 MED FILL — TELMISARTAN 40 MG TABLET: 40 | 90 days supply | Qty: 90 | Fill #1

## 2016-01-22 MED FILL — TAMSULOSIN HCL 0.4 MG CAP: 0.4 | 30 days supply | Qty: 30 | Fill #1

## 2016-01-22 MED FILL — EZETIMIBE 10 MG TABLET: 10 | 90 days supply | Qty: 90 | Fill #1

## 2016-01-22 MED FILL — PANTOPRAZOLE SOD DR 40 MG T: 40 | 90 days supply | Qty: 90 | Fill #1

## 2016-01-25 ENCOUNTER — Encounter: Payer: Self-pay | Admitting: Pharmacist

## 2016-01-25 ENCOUNTER — Other Ambulatory Visit: Payer: Self-pay | Admitting: Pharmacist

## 2016-01-25 VITALS — BP 138/66 | Wt 166.0 lb

## 2016-01-25 DIAGNOSIS — E119 Type 2 diabetes mellitus without complications: Secondary | ICD-10-CM

## 2016-01-25 LAB — HEMOGLOBIN A1C: Hemoglobin A1C: 7.1

## 2016-01-25 LAB — POCT GLYCOSYLATED HEMOGLOBIN (HGB A1C): Hemoglobin A1C: 7.1

## 2016-01-25 NOTE — Patient Outreach (Signed)
Subjective:  Patient presents today for 3 month diabetes follow-up as part of the employer-sponsored Link to Wellness program.  Current diabetes regimen includes metformin 500 mg BID. Patient also continues on daily ASA, ARB and statin.  Most recent MD follow-up was with Dr. Ronnald Ramp in April. A1C at that visit was 7.5%. Patient has no pending appt to see Dr. Ronnald Ramp (he usually goes every 6 months). No med changes or major health changes at this time. Patient requested that we check A1C today.     Assessment:  Diabetes: Most recent A1C was 7.1% today which is slightly exceeding goal of less than 7%. Weight is stable from last visit with me.    CBG Review: Patient reports that he has struggled with remembering to check his blood sugar because he is usually rushing in the morning to get ready for work on time. He estimates that he is checking maybe once a week. When he does check he is checking fasting in the morning.   CBG averages are similar to last visit. Patient denies hypoglycemia.   High/Low- 140/98. Majority of readings are in the 110-120s.   Lifestyle improvements:  Physical Activity-  He continues to walk on the treadmill 4-5 times a week at work for 20-25 minutes. He is also walking at home several days a week for 45 minutes.    Nutrition-  Patient reports that he has been eating a little bit more lately- he has been attending more social events with church and he usually eats more at these events. He states that he is still eating out (one of his goals from last visit). He is still eating gravy biscuits a couple times a week, but this is an improvement from last visit.    Follow up with me in 5 months when I return from maternity leave. I will send A1C results to Dr. Ronnald Ramp.    Plan/Goals for Next Visit:  1. Improve your consistency in checking your blood sugar every morning. Wake up a few minutes earlier so you can check it.   2. Schedule an appointment to see Dr. Ronnald Ramp  sometime this fall- October or November.  3. Continue walking every day on the treadmill.     Next appointment to see me is: Friday January 12th at 8 AM.    Truett Mainland. [name], PharmD, BCPS, CDE [name] to [place] Coordinator [phone]

## 2016-02-14 DIAGNOSIS — Z7984 Long term (current) use of oral hypoglycemic drugs: Secondary | ICD-10-CM | POA: Diagnosis not present

## 2016-02-14 DIAGNOSIS — H2513 Age-related nuclear cataract, bilateral: Secondary | ICD-10-CM | POA: Diagnosis not present

## 2016-02-14 DIAGNOSIS — E119 Type 2 diabetes mellitus without complications: Secondary | ICD-10-CM | POA: Diagnosis not present

## 2016-02-14 DIAGNOSIS — H524 Presbyopia: Secondary | ICD-10-CM | POA: Diagnosis not present

## 2016-02-14 DIAGNOSIS — H52223 Regular astigmatism, bilateral: Secondary | ICD-10-CM | POA: Diagnosis not present

## 2016-02-14 DIAGNOSIS — H5203 Hypermetropia, bilateral: Secondary | ICD-10-CM | POA: Diagnosis not present

## 2016-02-26 ENCOUNTER — Other Ambulatory Visit: Payer: Self-pay | Admitting: Internal Medicine

## 2016-02-26 DIAGNOSIS — E785 Hyperlipidemia, unspecified: Secondary | ICD-10-CM

## 2016-02-26 DIAGNOSIS — E118 Type 2 diabetes mellitus with unspecified complications: Secondary | ICD-10-CM

## 2016-02-26 MED ORDER — ATORVASTATIN CALCIUM 20 MG PO TABS: 20.0000 mg | ORAL_TABLET | Freq: Every day | ORAL | 1 refills | Status: DC

## 2016-02-26 MED ORDER — METFORMIN HCL 500 MG PO TABS: 500.0000 mg | ORAL_TABLET | Freq: Two times a day (BID) | ORAL | 1 refills | Status: DC

## 2016-02-26 MED FILL — TAMSULOSIN HCL 0.4 MG CAP: 0.4 | 30 days supply | Qty: 30 | Fill #2

## 2016-02-26 MED FILL — ATORVASTATIN 20 MG TABLET: 20 | 90 days supply | Qty: 90 | Fill #0

## 2016-02-26 MED FILL — metFORMIN HCL 500 MG TABS: 500 | 90 days supply | Qty: 180 | Fill #0

## 2016-04-21 MED FILL — TELMISARTAN 40 MG TABLET: 40 | 90 days supply | Qty: 90 | Fill #2

## 2016-04-22 ENCOUNTER — Other Ambulatory Visit: Payer: Self-pay | Admitting: *Deleted

## 2016-04-22 DIAGNOSIS — R339 Retention of urine, unspecified: Secondary | ICD-10-CM

## 2016-04-22 MED ORDER — TAMSULOSIN HCL 0.4 MG PO CAPS
0.4000 mg | ORAL_CAPSULE | Freq: Every day | ORAL | 1 refills | Status: DC
Start: 2016-04-22 — End: 2017-01-21

## 2016-04-22 MED FILL — TAMSULOSIN HCL 0.4 MG CAP: 0.4 | 90 days supply | Qty: 90 | Fill #0

## 2016-04-29 ENCOUNTER — Other Ambulatory Visit: Payer: Self-pay | Admitting: Emergency Medicine

## 2016-04-29 DIAGNOSIS — L309 Dermatitis, unspecified: Secondary | ICD-10-CM

## 2016-04-29 MED ORDER — FLUOCINONIDE 0.05 % EX SOLN: CUTANEOUS | 2 refills | Status: DC

## 2016-04-29 MED FILL — FLUOCINONIDE 0.05% SOLUTION: 0.05 | 30 days supply | Qty: 60 | Fill #0

## 2016-04-29 NOTE — Telephone Encounter (Signed)
Pt came in and stated he needed a refill on his fluocinonide (LIDEX) 0.05 % external solution. Please advise thanks.

## 2016-04-29 NOTE — Telephone Encounter (Signed)
Please advise if you are okay with refill. Reorder is pended in this visit LOV was 10/15/2015

## 2016-04-29 NOTE — Telephone Encounter (Signed)
lmom with details regarding rx sent

## 2016-05-08 ENCOUNTER — Other Ambulatory Visit (INDEPENDENT_AMBULATORY_CARE_PROVIDER_SITE_OTHER): Payer: 59

## 2016-05-08 ENCOUNTER — Ambulatory Visit (INDEPENDENT_AMBULATORY_CARE_PROVIDER_SITE_OTHER): Payer: 59 | Admitting: Internal Medicine

## 2016-05-08 ENCOUNTER — Encounter: Payer: Self-pay | Admitting: Internal Medicine

## 2016-05-08 VITALS — BP 190/80 | HR 64 | Temp 98.0°F | Resp 16 | Ht 71.0 in | Wt 168.2 lb

## 2016-05-08 DIAGNOSIS — E118 Type 2 diabetes mellitus with unspecified complications: Secondary | ICD-10-CM

## 2016-05-08 DIAGNOSIS — D539 Nutritional anemia, unspecified: Secondary | ICD-10-CM

## 2016-05-08 DIAGNOSIS — I10 Essential (primary) hypertension: Secondary | ICD-10-CM | POA: Diagnosis not present

## 2016-05-08 LAB — FOLATE

## 2016-05-08 LAB — IBC PANEL
IRON: 60 ug/dL (ref 42–165)
Saturation Ratios: 18.2 % — ABNORMAL LOW (ref 20.0–50.0)
TRANSFERRIN: 236 mg/dL (ref 212.0–360.0)

## 2016-05-08 LAB — CBC WITH DIFFERENTIAL/PLATELET
BASOS ABS: 0 10*3/uL (ref 0.0–0.1)
Basophils Relative: 0.3 % (ref 0.0–3.0)
EOS ABS: 0.2 10*3/uL (ref 0.0–0.7)
Eosinophils Relative: 3.4 % (ref 0.0–5.0)
HEMATOCRIT: 36.4 % — AB (ref 39.0–52.0)
HEMOGLOBIN: 12.2 g/dL — AB (ref 13.0–17.0)
LYMPHS PCT: 20.3 % (ref 12.0–46.0)
Lymphs Abs: 1.2 10*3/uL (ref 0.7–4.0)
MCHC: 33.6 g/dL (ref 30.0–36.0)
MCV: 91.8 fl (ref 78.0–100.0)
Monocytes Absolute: 0.5 10*3/uL (ref 0.1–1.0)
Monocytes Relative: 9.4 % (ref 3.0–12.0)
Neutro Abs: 3.8 10*3/uL (ref 1.4–7.7)
Neutrophils Relative %: 66.6 % (ref 43.0–77.0)
PLATELETS: 209 10*3/uL (ref 150.0–400.0)
RBC: 3.97 Mil/uL — AB (ref 4.22–5.81)
RDW: 13.7 % (ref 11.5–15.5)
WBC: 5.8 10*3/uL (ref 4.0–10.5)

## 2016-05-08 LAB — BASIC METABOLIC PANEL
BUN: 17 mg/dL (ref 6–23)
CALCIUM: 9.3 mg/dL (ref 8.4–10.5)
CO2: 27 mEq/L (ref 19–32)
CREATININE: 1.23 mg/dL (ref 0.40–1.50)
Chloride: 107 mEq/L (ref 96–112)
GFR: 72.1 mL/min (ref >60.00)
Glucose, Bld: 120 mg/dL — ABNORMAL HIGH (ref 70–99)
Potassium: 4.1 mEq/L (ref 3.5–5.1)
Sodium: 143 mEq/L (ref 135–145)

## 2016-05-08 LAB — RETICULOCYTES
ABS Retic: 35370 cells/uL (ref 25000–90000)
RBC.: 3.93 MIL/uL — AB (ref 4.20–5.80)
Retic Ct Pct: 0.9 %

## 2016-05-08 LAB — HEMOGLOBIN A1C: Hgb A1c MFr Bld: 7.2 % — ABNORMAL HIGH (ref 4.6–6.5)

## 2016-05-08 LAB — FERRITIN: Ferritin: 49.3 ng/mL (ref 22.0–322.0)

## 2016-05-08 LAB — VITAMIN B12: Vitamin B-12: 463 pg/mL (ref 211–911)

## 2016-05-08 MED ORDER — CHLORTHALIDONE 25 MG PO TABS: 25.0000 mg | ORAL_TABLET | Freq: Every day | ORAL | 1 refills | Status: DC

## 2016-05-08 MED FILL — CHLORTHALIDONE 25 MG TABLET: 25 | 90 days supply | Qty: 90 | Fill #0

## 2016-05-08 NOTE — Progress Notes (Signed)
Pre visit review using our clinic review tool, if applicable. No additional management support is needed unless otherwise documented below in the visit note. 

## 2016-05-08 NOTE — Patient Instructions (Signed)
Hypertension Hypertension, commonly called high blood pressure, is when the force of blood pumping through your arteries is too strong. Your arteries are the blood vessels that carry blood from your heart throughout your body. A blood pressure reading consists of a higher number over a lower number, such as 110/72. The higher number (systolic) is the pressure inside your arteries when your heart pumps. The lower number (diastolic) is the pressure inside your arteries when your heart relaxes. Ideally you want your blood pressure below 120/80. Hypertension forces your heart to work harder to pump blood. Your arteries may become narrow or stiff. Having untreated or uncontrolled hypertension can cause heart attack, stroke, kidney disease, and other problems. What increases the risk? Some risk factors for high blood pressure are controllable. Others are not. Risk factors you cannot control include:  Race. You may be at higher risk if you are African American.  Age. Risk increases with age.  Gender. Men are at higher risk than women before age 45 years. After age 65, women are at higher risk than men. Risk factors you can control include:  Not getting enough exercise or physical activity.  Being overweight.  Getting too much fat, sugar, calories, or salt in your diet.  Drinking too much alcohol. What are the signs or symptoms? Hypertension does not usually cause signs or symptoms. Extremely high blood pressure (hypertensive crisis) may cause headache, anxiety, shortness of breath, and nosebleed. How is this diagnosed? To check if you have hypertension, your health care provider will measure your blood pressure while you are seated, with your arm held at the level of your heart. It should be measured at least twice using the same arm. Certain conditions can cause a difference in blood pressure between your right and left arms. A blood pressure reading that is higher than normal on one occasion does  not mean that you need treatment. If it is not clear whether you have high blood pressure, you may be asked to return on a different day to have your blood pressure checked again. Or, you may be asked to monitor your blood pressure at home for 1 or more weeks. How is this treated? Treating high blood pressure includes making lifestyle changes and possibly taking medicine. Living a healthy lifestyle can help lower high blood pressure. You may need to change some of your habits. Lifestyle changes may include:  Following the DASH diet. This diet is high in fruits, vegetables, and whole grains. It is low in salt, red meat, and added sugars.  Keep your sodium intake below 2,300 mg per day.  Getting at least 30-45 minutes of aerobic exercise at least 4 times per week.  Losing weight if necessary.  Not smoking.  Limiting alcoholic beverages.  Learning ways to reduce stress. Your health care provider may prescribe medicine if lifestyle changes are not enough to get your blood pressure under control, and if one of the following is true:  You are 18-59 years of age and your systolic blood pressure is above 140.  You are 60 years of age or older, and your systolic blood pressure is above 150.  Your diastolic blood pressure is above 90.  You have diabetes, and your systolic blood pressure is over 140 or your diastolic blood pressure is over 90.  You have kidney disease and your blood pressure is above 140/90.  You have heart disease and your blood pressure is above 140/90. Your personal target blood pressure may vary depending on your medical   conditions, your age, and other factors. Follow these instructions at home:  Have your blood pressure rechecked as directed by your health care provider.  Take medicines only as directed by your health care provider. Follow the directions carefully. Blood pressure medicines must be taken as prescribed. The medicine does not work as well when you skip  doses. Skipping doses also puts you at risk for problems.  Do not smoke.  Monitor your blood pressure at home as directed by your health care provider. Contact a health care provider if:  You think you are having a reaction to medicines taken.  You have recurrent headaches or feel dizzy.  You have swelling in your ankles.  You have trouble with your vision. Get help right away if:  You develop a severe headache or confusion.  You have unusual weakness, numbness, or feel faint.  You have severe chest or abdominal pain.  You vomit repeatedly.  You have trouble breathing. This information is not intended to replace advice given to you by your health care provider. Make sure you discuss any questions you have with your health care provider. Document Released: 06/09/2005 Document Revised: 11/15/2015 Document Reviewed: 04/01/2013 Elsevier Interactive Patient Education  2017 [employer]vier Inc.  

## 2016-05-08 NOTE — Progress Notes (Signed)
Subjective:  Patient ID: NICOLE MCGOVERN, male    DOB: [birth year]  Age: 82 y.o. MRN: [id number]  CC: Hypertension; Diabetes; and Anemia   HPI Kainoah Diles Sharples presents for a blood pressure check. He tells me his blood pressure has not been well controlled but fortunately, he's had no recent episodes of headache/blurred vision/chest pain sure/shortness of breath/palpitations/edema/fatigue.  He also needs a follow-up on chronic anemia. He denies any episodes of blood loss, numbness, tingling, fatigue, or shortness of breath.  Outpatient Medications Prior to Visit  Medication Sig Dispense Refill  . ALPRAZolam (XANAX) 0.5 MG tablet Take 1 tablet (0.5 mg total) by mouth daily as needed for anxiety. 30 tablet 3  . aspirin (ASPIRIN LOW DOSE) 81 MG EC tablet TAKE 1 TABLET BY MOUTH EVERY DAY 90 tablet 3  . atorvastatin (LIPITOR) 20 MG tablet Take 1 tablet (20 mg total) by mouth daily. 90 tablet 1  . Cholecalciferol (VITAMIN D) 1000 UNITS capsule Take 1,000 Units by mouth daily.      . fluocinonide (LIDEX) 0.05 % external solution APPLY TO AFFECTED AREA AS DIRECTED 60 mL 2  . glucose blood (TRUETRACK TEST) test strip TEST BLOOD SUGAR ONCE DAILY. DX E11.09 100 each 3  . Lancets MISC Use to test blood sugar once daily. DX E11.09 100 each 3  . metFORMIN (GLUCOPHAGE) 500 MG tablet Take 1 tablet (500 mg total) by mouth 2 (two) times daily. 180 tablet 1  . pantoprazole (PROTONIX) 40 MG tablet TAKE 1 TABLET BY MOUTH ONCE DAILY 90 tablet PRN  . tamsulosin (FLOMAX) 0.4 MG CAPS capsule Take 1 capsule (0.4 mg total) by mouth daily. 90 capsule 1  . telmisartan (MICARDIS) 40 MG tablet Take 1 tablet (40 mg total) by mouth daily. 90 tablet 3  . Multiple Vitamin (MULTIVITAMIN) capsule Take 1 capsule by mouth daily.      Marland Kitchen ezetimibe (ZETIA) 10 MG tablet TAKE 1 TABLET BY MOUTH DAILY. 90 tablet 3   No facility-administered medications prior to visit.     ROS Review of Systems  Constitutional: Negative for  appetite change, diaphoresis, fatigue and unexpected weight change.  HENT: Negative.   Eyes: Negative for visual disturbance.  Respiratory: Negative for cough, choking, chest tightness, shortness of breath and stridor.   Cardiovascular: Negative.  Negative for chest pain, palpitations and leg swelling.  Gastrointestinal: Negative for abdominal pain, anal bleeding, blood in stool, constipation, diarrhea, nausea and vomiting.  Endocrine: Negative.  Negative for cold intolerance and heat intolerance.  Genitourinary: Negative.  Negative for difficulty urinating.  Musculoskeletal: Negative.  Negative for back pain, myalgias and neck pain.  Skin: Positive for rash.  Allergic/Immunologic: Negative.   Neurological: Negative.  Negative for dizziness, seizures, weakness, light-headedness and numbness.  Hematological: Negative.  Negative for adenopathy. Does not bruise/bleed easily.  Psychiatric/Behavioral: Negative.     Objective:  BP (!) 190/80 (BP Location: Left Arm, Patient Position: Sitting, Cuff Size: Normal) Comment: No meds today  Pulse 64   Temp 98 F (36.7 C) (Oral)   Resp 16   Ht 5\' 11"  (1.803 m)   Wt 168 lb 4 oz (76.3 kg)   SpO2 98%   BMI 23.47 kg/m   BP Readings from Last 3 Encounters:  05/08/16 (!) 190/80  01/25/16 138/66  12/07/15 134/72    Wt Readings from Last 3 Encounters:  05/08/16 168 lb 4 oz (76.3 kg)  01/25/16 166 lb (75.3 kg)  12/07/15 164 lb (74.4 kg)    Physical Exam  Constitutional: He is oriented to person, place, and time. He appears well-developed and well-nourished. No distress.  HENT:  Mouth/Throat: Oropharynx is clear and moist. No oropharyngeal exudate.  Eyes: Conjunctivae are normal. Right eye exhibits no discharge. Left eye exhibits no discharge. No scleral icterus.  Neck: Normal range of motion. Neck supple. No JVD present. No tracheal deviation present. No thyromegaly present.  Cardiovascular: Normal rate, regular rhythm, normal heart sounds and  intact distal pulses.  Exam reveals no gallop and no friction rub.   No murmur heard. Pulmonary/Chest: Effort normal and breath sounds normal. No stridor. No respiratory distress. He has no wheezes. He has no rales. He exhibits no tenderness.  Abdominal: Soft. Bowel sounds are normal. He exhibits no distension and no mass. There is no tenderness. There is no rebound and no guarding.  Musculoskeletal: Normal range of motion. He exhibits no edema, tenderness or deformity.  Lymphadenopathy:    He has no cervical adenopathy.  Neurological: He is oriented to person, place, and time.  Skin: Skin is warm and dry. No rash noted. He is not diaphoretic. No erythema. No pallor.  Vitals reviewed.   Lab Results  Component Value Date   WBC 5.8 05/08/2016   HGB 12.2 (L) 05/08/2016   HCT 36.4 (L) 05/08/2016   PLT 209.0 05/08/2016   GLUCOSE 120 (H) 05/08/2016   CHOL 103 10/15/2015   TRIG 81.0 10/15/2015   HDL 46.60 10/15/2015   LDLCALC 40 10/15/2015   ALT 25 10/15/2015   AST 21 10/15/2015   NA 143 05/08/2016   K 4.1 05/08/2016   CL 107 05/08/2016   CREATININE 1.23 05/08/2016   BUN 17 05/08/2016   CO2 27 05/08/2016   TSH 1.89 10/15/2015   PSA 2.55 08/21/2015   HGBA1C 7.2 (H) 05/08/2016   MICROALBUR 2.6 (H) 10/15/2015    Dg Chest 2 View  Result Date: 03/20/2015 CLINICAL DATA:  Four day history of cough and congestion EXAM: CHEST  2 VIEW COMPARISON:  September 12, 2013 FINDINGS: Rounded opacities in each lung base are suggestive of nipple shadows. There is no edema or consolidation. Heart size and pulmonary vascularity are normal. No adenopathy. There is degenerative change in the thoracic spine. IMPRESSION: Probable nipple shadows on each side. Advise repeat study with nipple markers to confirm. No edema or consolidation. Electronically Signed   By: Lowella Grip III M.D.   On: 03/20/2015 15:20    Assessment & Plan:   Ivy was seen today for hypertension, diabetes and anemia.  Diagnoses  and all orders for this visit:  Essential hypertension- His blood pressure is not controlled with the ARB so I've asked him to add a thiazide diuretic. His electrolytes and renal function are stable. -     Basic metabolic panel; Future -     chlorthalidone (HYGROTON) 25 MG tablet; Take 1 tablet (25 mg total) by mouth daily.  Type 2 diabetes mellitus with complication, without long-term current use of insulin ([place])- his A1c is at 7.2%, his blood sugars are adequately well controlled. -     Basic metabolic panel; Future -     Hemoglobin A1c; Future  Deficiency anemia- his anemia is stable over the last 2 years and he maintains normal vitamin levels. This is most likely the anemia of chronic disease and/or mild hemoglobinopathy that does not warrant any further evaluation. -     CBC with Differential/Platelet; Future -     IBC panel; Future -     Reticulocytes; Future -  Ferritin; Future -     Vitamin B12; Future -     Folate; Future   I have discontinued Mr. Greulich's multivitamin and ezetimibe. I am also having him start on chlorthalidone. Additionally, I am having him maintain his Vitamin D, aspirin, ALPRAZolam, glucose blood, Lancets, telmisartan, pantoprazole, atorvastatin, metFORMIN, tamsulosin, and fluocinonide.  Meds ordered this encounter  Medications  . chlorthalidone (HYGROTON) 25 MG tablet    Sig: Take 1 tablet (25 mg total) by mouth daily.    Dispense:  90 tablet    Refill:  1     Follow-up: Return in about 2 months (around 07/08/2016).  Scarlette Calico, MD

## 2016-05-09 ENCOUNTER — Encounter: Payer: Self-pay | Admitting: Internal Medicine

## 2016-05-13 DIAGNOSIS — N138 Other obstructive and reflux uropathy: Secondary | ICD-10-CM | POA: Diagnosis not present

## 2016-06-12 ENCOUNTER — Other Ambulatory Visit: Payer: Self-pay | Admitting: Internal Medicine

## 2016-06-12 MED FILL — FLUOCINONIDE 0.05% SOLUTION: 0.05 | 30 days supply | Qty: 60 | Fill #1

## 2016-06-12 MED FILL — ATORVASTATIN 20 MG TABLET: 20 | 90 days supply | Qty: 90 | Fill #1

## 2016-06-12 MED FILL — ALPRAZolam 0.5 MG TABS: 0.5 | 30 days supply | Qty: 30 | Fill #0

## 2016-06-12 MED FILL — PANTOPRAZOLE SOD DR 40 MG T: 40 | 90 days supply | Qty: 90 | Fill #2

## 2016-06-12 MED FILL — EZETIMIBE 10 MG TABLET: 10 | 90 days supply | Qty: 90 | Fill #2

## 2016-06-12 NOTE — Telephone Encounter (Signed)
rx faxed to [facility].  

## 2016-07-04 ENCOUNTER — Ambulatory Visit: Payer: Self-pay | Admitting: Pharmacist

## 2016-07-16 ENCOUNTER — Other Ambulatory Visit: Payer: Self-pay | Admitting: Internal Medicine

## 2016-07-16 MED FILL — TRUE METRIX GLUCOSE TEST ST: 100 days supply | Qty: 100 | Fill #0

## 2016-07-24 ENCOUNTER — Telehealth: Payer: Self-pay

## 2016-07-24 MED ORDER — BLOOD GLUCOSE MONITOR KIT: PACK | 0 refills | Status: AC

## 2016-07-24 MED ORDER — LANCETS MISC: 3 refills | Status: AC

## 2016-07-24 MED ORDER — GLUCOSE BLOOD VI STRP
ORAL_STRIP | 3 refills | Status: DC
Start: 2016-07-24 — End: 2017-10-15

## 2016-07-24 MED FILL — FREESTYLE LITE METER: 30 days supply | Qty: 1 | Fill #0

## 2016-07-24 MED FILL — FREESTYLE LANCETS: 90 days supply | Qty: 100 | Fill #0

## 2016-07-24 MED FILL — FREESTYLE LITE TEST STRIP: 90 days supply | Qty: 100 | Fill #0

## 2016-07-24 NOTE — Telephone Encounter (Signed)
Received fax that the true metrix is no longer covered.  Ins is covering the Freestyle Lite Meter, Strips and Lancets.   [place] sent to [facility]

## 2016-08-06 MED FILL — CHLORTHALIDONE 25 MG TABLET: 25 | 90 days supply | Qty: 90 | Fill #1

## 2016-08-14 ENCOUNTER — Other Ambulatory Visit: Payer: Self-pay | Admitting: *Deleted

## 2016-08-14 ENCOUNTER — Encounter: Payer: Self-pay | Admitting: Podiatry

## 2016-08-14 ENCOUNTER — Ambulatory Visit (INDEPENDENT_AMBULATORY_CARE_PROVIDER_SITE_OTHER): Payer: 59

## 2016-08-14 ENCOUNTER — Ambulatory Visit (INDEPENDENT_AMBULATORY_CARE_PROVIDER_SITE_OTHER): Payer: 59 | Admitting: Podiatry

## 2016-08-14 VITALS — BP 132/72 | HR 80 | Resp 16

## 2016-08-14 DIAGNOSIS — M2041 Other hammer toe(s) (acquired), right foot: Secondary | ICD-10-CM

## 2016-08-14 DIAGNOSIS — M2042 Other hammer toe(s) (acquired), left foot: Secondary | ICD-10-CM

## 2016-08-14 DIAGNOSIS — E119 Type 2 diabetes mellitus without complications: Secondary | ICD-10-CM

## 2016-08-14 DIAGNOSIS — S90211A Contusion of right great toe with damage to nail, initial encounter: Secondary | ICD-10-CM

## 2016-08-14 DIAGNOSIS — M79676 Pain in unspecified toe(s): Secondary | ICD-10-CM

## 2016-08-14 DIAGNOSIS — M79609 Pain in unspecified limb: Secondary | ICD-10-CM

## 2016-08-14 DIAGNOSIS — E1142 Type 2 diabetes mellitus with diabetic polyneuropathy: Secondary | ICD-10-CM

## 2016-08-14 DIAGNOSIS — B351 Tinea unguium: Secondary | ICD-10-CM

## 2016-08-14 NOTE — Progress Notes (Signed)
   Subjective:    Patient ID: Dale Anderson, male    DOB: 16-Feb-1930, 83 y.o.   MRN: [id number]  HPI: Dale Anderson presents today with a chief complaint of a bruised toenail from a tight shoe hallux right. He states it has been bothering him for the past few weeks and is concerned because he is a diabetic. He states that his last hemoglobin A1c was 7.2. He's not having a whole lot of discomfort out of the toenail but he would like to be checked anyway.    Review of Systems  Endocrine: Positive for polyuria.  All other systems reviewed and are negative.      Objective:   Physical Exam: Vital signs are stable alert and oriented 3. Pulses are palpable. Neurologic sensorium is slightly diminished per Semmes-Weinstein monofilament. Deep tendon reflexes are intact. Muscle strength +5 over 5 dorsiflexion plantar flexors and inverters and everters all to the musculature is intact. Mild hammertoe deformities bilateral. Subcutaneous/subungual hematoma hallux right. Otherwise nails are long thick yellow dystrophic onychomycotic.    Assessment & Plan:  Diabetes mellitus with diabetic peripheral neuropathy. A limb secondary to onychomycosis. Subungual hematoma healing.  Plan: Debridement of toenails bilateral. Follow-up with Dale Anderson for routine nail debridement.

## 2016-08-15 MED FILL — FLUOCINONIDE 0.05% SOLUTION: 0.05 | 30 days supply | Qty: 60 | Fill #2

## 2016-08-15 MED FILL — metFORMIN HCL 500 MG TABS: 500 | 90 days supply | Qty: 180 | Fill #1

## 2016-09-08 MED FILL — TAMSULOSIN HCL 0.4 MG CAP: 0.4 | 90 days supply | Qty: 90 | Fill #1

## 2016-09-22 MED FILL — ALPRAZolam 0.5 MG TABS: 0.5 | 30 days supply | Qty: 30 | Fill #1

## 2016-10-07 MED FILL — PANTOPRAZOLE SOD DR 40 MG T: 40 | 90 days supply | Qty: 90 | Fill #3

## 2016-10-28 ENCOUNTER — Encounter (INDEPENDENT_AMBULATORY_CARE_PROVIDER_SITE_OTHER): Payer: 59 | Admitting: Podiatry

## 2016-10-28 NOTE — Progress Notes (Signed)
This encounter was created in error - please disregard.

## 2016-10-29 ENCOUNTER — Other Ambulatory Visit: Payer: Self-pay | Admitting: Internal Medicine

## 2016-10-29 DIAGNOSIS — I10 Essential (primary) hypertension: Secondary | ICD-10-CM

## 2016-10-29 MED FILL — CHLORTHALIDONE 25 MG TABLET: 25 | 30 days supply | Qty: 30 | Fill #0

## 2016-10-31 ENCOUNTER — Telehealth: Payer: Self-pay | Admitting: Internal Medicine

## 2016-10-31 DIAGNOSIS — E785 Hyperlipidemia, unspecified: Secondary | ICD-10-CM

## 2016-10-31 DIAGNOSIS — E118 Type 2 diabetes mellitus with unspecified complications: Secondary | ICD-10-CM

## 2016-10-31 NOTE — Telephone Encounter (Signed)
Wife will get patient to call to schedule appt

## 2016-11-10 ENCOUNTER — Telehealth: Payer: Self-pay

## 2016-11-10 NOTE — Telephone Encounter (Signed)
[facility] sent rf rq for ezetimibe 10 mg tablet.   LOV 05/08/2016  Previous note stated that pt would call and schedule an appt.   Refill denial faxed back to pharmacy.

## 2016-11-24 ENCOUNTER — Ambulatory Visit (INDEPENDENT_AMBULATORY_CARE_PROVIDER_SITE_OTHER): Payer: 59 | Admitting: Internal Medicine

## 2016-11-24 ENCOUNTER — Encounter: Payer: Self-pay | Admitting: Internal Medicine

## 2016-11-24 ENCOUNTER — Other Ambulatory Visit (INDEPENDENT_AMBULATORY_CARE_PROVIDER_SITE_OTHER): Payer: 59

## 2016-11-24 VITALS — BP 150/80 | HR 56 | Temp 97.9°F | Resp 16 | Ht 71.0 in | Wt 163.0 lb

## 2016-11-24 DIAGNOSIS — D539 Nutritional anemia, unspecified: Secondary | ICD-10-CM

## 2016-11-24 DIAGNOSIS — E785 Hyperlipidemia, unspecified: Secondary | ICD-10-CM

## 2016-11-24 DIAGNOSIS — E118 Type 2 diabetes mellitus with unspecified complications: Secondary | ICD-10-CM

## 2016-11-24 DIAGNOSIS — I10 Essential (primary) hypertension: Secondary | ICD-10-CM

## 2016-11-24 LAB — CBC WITH DIFFERENTIAL/PLATELET
BASOS PCT: 0.5 % (ref 0.0–3.0)
Basophils Absolute: 0 10*3/uL (ref 0.0–0.1)
EOS PCT: 3.4 % (ref 0.0–5.0)
Eosinophils Absolute: 0.2 10*3/uL (ref 0.0–0.7)
HEMATOCRIT: 39.6 % (ref 39.0–52.0)
HEMOGLOBIN: 13.5 g/dL (ref 13.0–17.0)
LYMPHS PCT: 22.8 % (ref 12.0–46.0)
Lymphs Abs: 1.2 10*3/uL (ref 0.7–4.0)
MCHC: 34.2 g/dL (ref 30.0–36.0)
MCV: 91.4 fl (ref 78.0–100.0)
MONO ABS: 0.5 10*3/uL (ref 0.1–1.0)
Monocytes Relative: 10 % (ref 3.0–12.0)
Neutro Abs: 3.3 10*3/uL (ref 1.4–7.7)
Neutrophils Relative %: 63.3 % (ref 43.0–77.0)
Platelets: 203 10*3/uL (ref 150.0–400.0)
RBC: 4.33 Mil/uL (ref 4.22–5.81)
RDW: 13.5 % (ref 11.5–15.5)
WBC: 5.1 10*3/uL (ref 4.0–10.5)

## 2016-11-24 LAB — COMPREHENSIVE METABOLIC PANEL
ALBUMIN: 4.2 g/dL (ref 3.5–5.2)
ALK PHOS: 69 U/L (ref 39–117)
ALT: 16 U/L (ref 0–53)
AST: 18 U/L (ref 0–37)
BUN: 31 mg/dL — AB (ref 6–23)
CALCIUM: 9.6 mg/dL (ref 8.4–10.5)
CO2: 26 mEq/L (ref 19–32)
CREATININE: 1.4 mg/dL (ref 0.40–1.50)
Chloride: 104 mEq/L (ref 96–112)
GFR: 62.01 mL/min (ref >60.00)
Glucose, Bld: 159 mg/dL — ABNORMAL HIGH (ref 70–99)
POTASSIUM: 3.8 meq/L (ref 3.5–5.1)
SODIUM: 138 meq/L (ref 135–145)
Total Bilirubin: 0.8 mg/dL (ref 0.2–1.2)
Total Protein: 7.3 g/dL (ref 6.0–8.3)

## 2016-11-24 LAB — LIPID PANEL
CHOLESTEROL: 160 mg/dL (ref 0–200)
HDL: 43.7 mg/dL (ref >39.00)
LDL Cholesterol: 90 mg/dL (ref 0–99)
NonHDL: 116.07
Total CHOL/HDL Ratio: 4
Triglycerides: 129 mg/dL (ref 0.0–149.0)
VLDL: 25.8 mg/dL (ref 0.0–40.0)

## 2016-11-24 LAB — MICROALBUMIN / CREATININE URINE RATIO
Creatinine,U: 116.5 mg/dL
MICROALB/CREAT RATIO: 1.6 mg/g (ref 0.0–30.0)
Microalb, Ur: 1.9 mg/dL (ref 0.0–1.9)

## 2016-11-24 LAB — URINALYSIS, ROUTINE W REFLEX MICROSCOPIC
BILIRUBIN URINE: NEGATIVE
Hgb urine dipstick: NEGATIVE
KETONES UR: NEGATIVE
LEUKOCYTES UA: NEGATIVE
Nitrite: NEGATIVE
PH: 6.5 (ref 5.0–8.0)
Specific Gravity, Urine: 1.015 (ref 1.000–1.030)
Total Protein, Urine: NEGATIVE
URINE GLUCOSE: NEGATIVE
UROBILINOGEN UA: 0.2 (ref 0.0–1.0)

## 2016-11-24 LAB — HEMOGLOBIN A1C: HEMOGLOBIN A1C: 8.6 % — AB (ref 4.6–6.5)

## 2016-11-24 LAB — TSH: TSH: 3.35 u[IU]/mL (ref 0.35–4.50)

## 2016-11-24 MED ORDER — CHLORTHALIDONE 25 MG PO TABS: 25.0000 mg | ORAL_TABLET | Freq: Every day | ORAL | 1 refills | Status: DC

## 2016-11-24 MED FILL — CHLORTHALIDONE 25 MG TABLET: 25 | 90 days supply | Qty: 90 | Fill #0

## 2016-11-24 NOTE — Patient Instructions (Signed)

## 2016-11-24 NOTE — Progress Notes (Signed)
Subjective:  Patient ID: Dale Anderson, male    DOB: 02-Mar-1930  Age: 83 y.o. MRN: [id number]  CC: Hypertension; Hyperlipidemia; Diabetes; and Anemia   HPI Dale Anderson presents for a f/up - he feels well and offers no complaints.  Outpatient Medications Prior to Visit  Medication Sig Dispense Refill  . ALPRAZolam (XANAX) 0.5 MG tablet TAKER 1 TABLET BY MOUTH DAILY AS NEEDED FOR ANXIETY 30 tablet 3  . aspirin (ASPIRIN LOW DOSE) 81 MG EC tablet TAKE 1 TABLET BY MOUTH EVERY DAY 90 tablet 3  . atorvastatin (LIPITOR) 20 MG tablet Take 1 tablet (20 mg total) by mouth daily. 90 tablet 1  . blood glucose meter kit and supplies KIT Use to test blood sugar once daily. DX: E11.9 1 each 0  . Blood Glucose Monitoring Suppl (FREESTYLE LITE) DEVI   0  . Cholecalciferol (VITAMIN D) 1000 UNITS capsule Take 1,000 Units by mouth daily.      Marland Kitchen ezetimibe (ZETIA) 10 MG tablet   3  . fluocinonide (LIDEX) 0.05 % external solution APPLY TO AFFECTED AREA AS DIRECTED 60 mL 2  . glucose blood (COOL BLOOD GLUCOSE TEST STRIPS) test strip Use to check blood sugar once daily. DX: E11.9 100 each 3  . Lancets MISC Use to test blood sugar once daily. DX E11.09 100 each 3  . pantoprazole (PROTONIX) 40 MG tablet TAKE 1 TABLET BY MOUTH ONCE DAILY 90 tablet PRN  . tamsulosin (FLOMAX) 0.4 MG CAPS capsule Take 1 capsule (0.4 mg total) by mouth daily. 90 capsule 1  . telmisartan (MICARDIS) 40 MG tablet Take 1 tablet (40 mg total) by mouth daily. 90 tablet 3  . chlorthalidone (HYGROTON) 25 MG tablet TAKE 1 TABLET (25 MG TOTAL) BY MOUTH DAILY. 30 tablet 0  . metFORMIN (GLUCOPHAGE) 500 MG tablet Take 1 tablet (500 mg total) by mouth 2 (two) times daily. 180 tablet 1   No facility-administered medications prior to visit.     ROS Review of Systems  Constitutional: Negative.  Negative for diaphoresis and fatigue.  HENT: Negative.   Eyes: Negative.  Negative for visual disturbance.  Respiratory: Negative.  Negative  for apnea, cough, chest tightness, shortness of breath and wheezing.   Cardiovascular: Negative.  Negative for chest pain, palpitations and leg swelling.  Gastrointestinal: Negative.  Negative for abdominal pain, constipation, diarrhea, nausea and vomiting.  Endocrine: Negative.  Negative for polydipsia, polyphagia and polyuria.  Genitourinary: Negative.  Negative for difficulty urinating and urgency.  Musculoskeletal: Negative.  Negative for arthralgias and myalgias.  Skin: Negative.  Negative for rash.  Allergic/Immunologic: Negative.   Neurological: Negative.  Negative for dizziness and headaches.  Hematological: Negative for adenopathy. Does not bruise/bleed easily.  Psychiatric/Behavioral: Negative.     Objective:  BP (!) 150/80 (BP Location: Left Arm, Patient Position: Sitting, Cuff Size: Normal)   Pulse (!) 56   Temp 97.9 F (36.6 C) (Oral)   Resp 16   Ht '5\' 11"'  (1.803 m)   Wt 163 lb (73.9 kg)   SpO2 93%   BMI 22.73 kg/m   BP Readings from Last 3 Encounters:  11/24/16 (!) 150/80  08/14/16 132/72  05/08/16 (!) 190/80    Wt Readings from Last 3 Encounters:  11/24/16 163 lb (73.9 kg)  05/08/16 168 lb 4 oz (76.3 kg)  01/25/16 166 lb (75.3 kg)    Physical Exam  Constitutional: He is oriented to person, place, and time. No distress.  HENT:  Mouth/Throat: No oropharyngeal exudate.  Eyes: Conjunctivae are normal. Right eye exhibits no discharge. Left eye exhibits no discharge. No scleral icterus.  Neck: Normal range of motion. Neck supple. No thyromegaly present.  Cardiovascular: Normal rate and regular rhythm.   Murmur (1/6 SEM over LLSB) heard. Pulmonary/Chest: Effort normal and breath sounds normal. He has no wheezes. He has no rales.  Abdominal: Soft. Bowel sounds are normal. He exhibits no mass. There is no tenderness. There is no guarding.  Musculoskeletal: Normal range of motion. He exhibits no edema, tenderness or deformity.  Lymphadenopathy:    He has no  cervical adenopathy.  Neurological: He is alert and oriented to person, place, and time.  Skin: Skin is warm and dry. No rash noted. He is not diaphoretic. No erythema. No pallor.  Psychiatric: He has a normal mood and affect. His behavior is normal. Judgment and thought content normal.  Vitals reviewed.   Lab Results  Component Value Date   WBC 5.1 11/24/2016   HGB 13.5 11/24/2016   HCT 39.6 11/24/2016   PLT 203.0 11/24/2016   GLUCOSE 159 (H) 11/24/2016   CHOL 160 11/24/2016   TRIG 129.0 11/24/2016   HDL 43.70 11/24/2016   LDLCALC 90 11/24/2016   ALT 16 11/24/2016   AST 18 11/24/2016   NA 138 11/24/2016   K 3.8 11/24/2016   CL 104 11/24/2016   CREATININE 1.40 11/24/2016   BUN 31 (H) 11/24/2016   CO2 26 11/24/2016   TSH 3.35 11/24/2016   PSA 2.55 08/21/2015   HGBA1C 8.6 (H) 11/24/2016   MICROALBUR 1.9 11/24/2016    Dg Chest 2 View  Result Date: 03/20/2015 CLINICAL DATA:  Four day history of cough and congestion EXAM: CHEST  2 VIEW COMPARISON:  September 12, 2013 FINDINGS: Rounded opacities in each lung base are suggestive of nipple shadows. There is no edema or consolidation. Heart size and pulmonary vascularity are normal. No adenopathy. There is degenerative change in the thoracic spine. IMPRESSION: Probable nipple shadows on each side. Advise repeat study with nipple markers to confirm. No edema or consolidation. Electronically Signed   By: Lowella Grip III M.D.   On: 03/20/2015 15:20    Assessment & Plan:   Dale Anderson was seen today for hypertension, hyperlipidemia, diabetes and anemia.  Diagnoses and all orders for this visit:  Deficiency anemia- his H&H are normal now, I will monitor his thiamine level. -     CBC with Differential/Platelet; Future -     Vitamin B1; Future  Type 2 diabetes mellitus with complication, without long-term current use of insulin ([place])- his A1c is up to 8.6%, I will continue metformin at the current dose but will also add on an SGLT2  inhibitor in the form of Synjardy. -     Comprehensive metabolic panel; Future -     Hemoglobin A1c; Future -     Microalbumin / creatinine urine ratio; Future -     Empagliflozin-Metformin HCl ER (SYNJARDY XR) 10-998 MG TB24; Take 1 tablet by mouth daily.  Hyperlipidemia with target LDL less than 100- he is achieved his LDL goal is doing well on the statin. -     Lipid panel; Future -     TSH; Future  Essential hypertension- his blood pressure is not quite adequately well controlled, I will continue the current regimen, adding on an SGLT2 inhibitor will help lower his blood pressure. His electrolytes and renal function are normal.  -     Comprehensive metabolic panel; Future -  Urinalysis, Routine w reflex microscopic; Future -     chlorthalidone (HYGROTON) 25 MG tablet; Take 1 tablet (25 mg total) by mouth daily.   I have discontinued Mr. Lopezgarcia's metFORMIN. I am also having him start on Empagliflozin-Metformin HCl ER. Additionally, I am having him maintain his Vitamin D, aspirin, telmisartan, pantoprazole, atorvastatin, tamsulosin, fluocinonide, ALPRAZolam, blood glucose meter kit and supplies, Lancets, glucose blood, ezetimibe, FREESTYLE LITE, and chlorthalidone.  Meds ordered this encounter  Medications  . chlorthalidone (HYGROTON) 25 MG tablet    Sig: Take 1 tablet (25 mg total) by mouth daily.    Dispense:  90 tablet    Refill:  1  . Empagliflozin-Metformin HCl ER (SYNJARDY XR) 10-998 MG TB24    Sig: Take 1 tablet by mouth daily.    Dispense:  30 tablet    Refill:  3     Follow-up: Return in about 6 months (around 05/26/2017).  Scarlette Calico, MD

## 2016-11-25 MED ORDER — EMPAGLIFLOZIN-METFORMIN HCL ER 5-1000 MG PO TB24: 1.0000 | ORAL_TABLET | Freq: Every day | ORAL | 3 refills | Status: DC

## 2016-11-26 ENCOUNTER — Other Ambulatory Visit: Payer: Self-pay | Admitting: Internal Medicine

## 2016-11-26 ENCOUNTER — Telehealth: Payer: Self-pay | Admitting: Internal Medicine

## 2016-11-26 DIAGNOSIS — E118 Type 2 diabetes mellitus with unspecified complications: Secondary | ICD-10-CM

## 2016-11-26 DIAGNOSIS — E785 Hyperlipidemia, unspecified: Secondary | ICD-10-CM

## 2016-11-26 MED FILL — SYNJARDY XR 5-1000 MG TB24: 5-1000 | 30 days supply | Qty: 30 | Fill #0

## 2016-11-26 MED FILL — EZETIMIBE 10 MG TAB: 10 | 90 days supply | Qty: 90 | Fill #0

## 2016-11-26 MED FILL — ATORVASTATIN 20 MG TABLET: 20 | 90 days supply | Qty: 90 | Fill #0

## 2016-11-26 NOTE — Telephone Encounter (Signed)
Pt called back regarding his blood work. I gave him MD response. He expressed understanding and had no questions at this time.

## 2016-11-28 ENCOUNTER — Telehealth: Payer: Self-pay | Admitting: *Deleted

## 2016-11-28 NOTE — Telephone Encounter (Signed)
Wife left msg on triage husband had appt on wed forgot to ask MD for refill on his alprazolam.../lmb

## 2016-11-29 LAB — VITAMIN B1: VITAMIN B1 (THIAMINE): 16 nmol/L (ref 8–30)

## 2016-12-01 MED FILL — ALPRAZolam 0.5 MG TABS: 0.5 | 30 days supply | Qty: 30 | Fill #2

## 2016-12-02 NOTE — Telephone Encounter (Signed)
pls advise on refill.../lmb 

## 2016-12-29 MED FILL — SYNJARDY XR 5-1000 MG TB24: 5-1000 | 30 days supply | Qty: 30 | Fill #1

## 2017-01-01 ENCOUNTER — Other Ambulatory Visit: Payer: Self-pay | Admitting: Internal Medicine

## 2017-01-01 MED FILL — PANTOPRAZOLE SOD DR 40 MG T: 40 | 90 days supply | Qty: 90 | Fill #0

## 2017-01-16 ENCOUNTER — Encounter: Payer: Self-pay | Admitting: Pharmacist

## 2017-01-16 ENCOUNTER — Other Ambulatory Visit: Payer: Self-pay | Admitting: Pharmacist

## 2017-01-16 VITALS — BP 117/72 | HR 67 | Wt 159.8 lb

## 2017-01-16 DIAGNOSIS — E119 Type 2 diabetes mellitus without complications: Secondary | ICD-10-CM

## 2017-01-16 NOTE — Patient Outreach (Signed)
[facility]) Care Management  01/16/2017  Dale Anderson Nov 29, 1929 [id number]  Subjective: Patient presents today for 3 month diabetes follow-up as part of the employer-sponsored Link to Wellness program.  Current diabetes regimen includes synjardy only.  Patient also continues on daily aspirin, telmisartan and atorvastatin.  Patient endorses compliance to medications. Most recent MD follow-up was 11/26/2016.  No med changes or major health changes at this time.   A1C has increased, patient attributes this to stopping exercise and poor dietary choices 2/2 increased travel with his wife to gospel choir events.  Testing BG once every other week and patient has noticed that it has started to creep up.   Patient reported dietary habits: Eats 3 meals/day Breakfast: Gravy biscuit every day, potato wedges occasionally  Lunch: soup, +/- crackers Dinner: Pizza and sweet potatoes  Snacks: milkshakes or pie every other week Drinks: Ginger Ale every 2-3 days, water  Patient reported exercise habits: walk at work all day (security), works in yard  Patient denies hypoglycemic events.  Patient reports nocturia x3.  Patient denies pain/burning upon urination.  Patient denies neuropathy. Patient denies visual changes. Patient reports self foot exams. No issues  Patient reported self monitored blood glucose frequency - once a day every other week Home fasting CBG: 130s-140s (147 today)  2 hour post-prandial/random CBG: does not check  Objective:  Lab Results  Component Value Date   HGBA1C 8.6 (H) 11/24/2016   Vitals:   01/16/17 1752  BP: 117/72  Pulse: 67   Filed Weights   01/16/17 1752  Weight: 159 lb 12.8 oz (72.5 kg)    Lipid Panel     Component Value Date/Time   CHOL 160 11/24/2016 0946   TRIG 129.0 11/24/2016 0946   HDL 43.70 11/24/2016 0946   CHOLHDL 4 11/24/2016 0946   VLDL 25.8 11/24/2016 0946   LDLCALC 90 11/24/2016 0946    Last eye exam: Feb  2018 Last dental exam: 11/2017  Assessment: Diabetes: uncontrolled, limited by poor dietary choices and decreased exercise given increase in travel - A1C 8.3% which is above goal of less than 7%.  - Weight is stable from last visit with me.    Physical Activity- below goal, limited by frequent travel - Below goal of at least 150 minutes of moderate intensity exercise weekly  Nutrition- suboptimal, limited by travel - Maintaining adherence to diabetic diet 50% of the time  Plan/Goals for Next Visit: - Counseled on s/sx hypoglycemia and appropriate treatment - Diet goal: increase Dm diet compliance first by decreasing days you are eating sausage biscuits in the morning  - Exercise goal: get back to walking on the treadmill on off-days (3x/week) - Counseled on sick day rules for [facility], patient verbalized understanding - Detailed goals and action plans are listed below - Go to pharmacy to learn how to use new BG meter  Next appointment to see me is: 04/17/2017  Deirdre Pippins, PharmD [facility] PGY2 Pharmacy Resident [phone]  [facility] CM Care Plan Problem One     Most Recent Value  Care Plan Problem One  A1C>7%  Role Documenting the Problem One  Clinical Pharmacist  Care Plan for Problem One  Active  [facility] Long Term Goal   Over the next 90 days patient will increase exercise by walking on treadmill for 30 mins on days he is not working (3 times/week) and will decrease the number of mornings he eats a sausage gravy biscuit to 2/week at most as evidenced by patient report  and A1C  [facility] Long Term Goal Start Date  01/16/17  Interventions for Problem One Long Term Goal  Counseled on healthy choices and repalcements for breakfast    [facility] CM Care Plan Problem Two     Most Recent Value  Care Plan Problem Two  Old meter, potentially inaccurate  Role Documenting the Problem Two  Clinical Pharmacist  Care Plan for Problem Two  Active  Interventions for Problem Two Long Term Goal   Counseled on  imprtance of keep meters up to date  [facility] Long Term Goal  Over the next 90 days patient will learn how to use new meter by going to the pharmacy and asking for help as evidenced by glucometer that he brings to our next visit  [place] Goal Start Date  01/16/17

## 2017-01-21 ENCOUNTER — Other Ambulatory Visit: Payer: Self-pay | Admitting: Internal Medicine

## 2017-01-21 DIAGNOSIS — R339 Retention of urine, unspecified: Secondary | ICD-10-CM

## 2017-01-21 MED FILL — TAMSULOSIN HCL 0.4 MG CAP: 0.4 | 90 days supply | Qty: 90 | Fill #0

## 2017-01-21 MED FILL — ALPRAZolam 0.5 MG TABS: 0.5 | 30 days supply | Qty: 30 | Fill #0

## 2017-02-02 MED FILL — SYNJARDY XR 5-1000 MG TB24: 5-1000 | 30 days supply | Qty: 30 | Fill #2

## 2017-03-03 MED FILL — SYNJARDY XR 5-1000 MG TB24: 5-1000 | 30 days supply | Qty: 30 | Fill #3

## 2017-03-03 MED FILL — EZETIMIBE 10 MG TAB: 10 | 90 days supply | Qty: 90 | Fill #1

## 2017-03-03 MED FILL — CHLORTHALIDONE 25 MG TABLET: 25 | 90 days supply | Qty: 90 | Fill #1

## 2017-04-16 ENCOUNTER — Other Ambulatory Visit: Payer: Self-pay | Admitting: Internal Medicine

## 2017-04-16 DIAGNOSIS — E118 Type 2 diabetes mellitus with unspecified complications: Secondary | ICD-10-CM

## 2017-04-16 MED FILL — SYNJARDY XR 5-1000 MG TB24: 5-1000 | 30 days supply | Qty: 30 | Fill #0

## 2017-04-16 MED FILL — PANTOPRAZOLE SOD DR 40 MG T: 40 | 90 days supply | Qty: 90 | Fill #1

## 2017-04-17 ENCOUNTER — Ambulatory Visit: Payer: Self-pay | Admitting: Pharmacist

## 2017-04-24 MED FILL — ATORVASTATIN 20 MG TABLET: 20 | 90 days supply | Qty: 90 | Fill #1

## 2017-04-27 DIAGNOSIS — R351 Nocturia: Secondary | ICD-10-CM | POA: Diagnosis not present

## 2017-05-11 ENCOUNTER — Other Ambulatory Visit: Payer: Self-pay

## 2017-05-11 NOTE — Patient Outreach (Signed)
[facility]) Care Management [place]  05/11/2017 Dale Anderson 1930/03/30 [id number]  Subjective: Patient presents today for 3 month diabetes follow-up as part of the employer-sponsored Link to Wellness program. Current diabetes regimen includes Synjardy XL only. Patient also continues on daily aspirin, telmisartan and atorvastatin. Patient endorses compliance to medications. Most recent MD follow-up was 11/24/2016. No med changes or major health changes at this time.  A1C has increased, patient attributes this to poor dietary choices 2/2 increased with recent travel and desserts.Testing BG a few times a week with sugars within goal.  Patient reported dietary habits: Eats 3 meals/day Breakfast: Gravy biscuit very occassionally, potato wedges   Lunch: Soup Dinner: Potatoes, pizza, big meal with starch Snacks: Milkshakes Drinks: Water and ginger ale  Patient reported exercise habits: walk at work all day (security), works in yard, walks on treadmill for 30 minutes 5x/week  Patient denies hypoglycemic events.  Patient reports nocturia x3.  Patient denies pain/burning upon urination.  Patient denies neuropathy. Patient denies visual changes. Patient reports self foot exams. No issues  Patient reported self monitored blood glucose frequency - a few times per week Home fasting CBG: 96-168, 113 today. 2 hour post-prandial/random CBG: does not check  Objective:  Lab Results  Component Value Date   HGBA1C 8.6 (H) 11/24/2016   Vitals:   05/11/17 1350  BP: 128/82  Pulse: 77  SpO2: 98%    Lipid Panel     Component Value Date/Time   CHOL 160 11/24/2016 0946   TRIG 129.0 11/24/2016 0946   HDL 43.70 11/24/2016 0946   CHOLHDL 4 11/24/2016 0946   VLDL 25.8 11/24/2016 0946   LDLCALC 90 11/24/2016 0946   7 day average = 134 Time spent hyperglycemic (last 30 days) = 2 Time spent hypoglycemic (last 30 days) = 0  Last eye exam: Feb 2018 Last  dental exam: June 2019  Encounter Medications: Outpatient Encounter Medications as of 05/11/2017  Medication Sig  . ALPRAZolam (XANAX) 0.5 MG tablet TAKE 1 TABLET BY MOUTH DAILY AS NEEDED FOR ANXIETY  . aspirin (ASPIRIN LOW DOSE) 81 MG EC tablet TAKE 1 TABLET BY MOUTH EVERY DAY  . atorvastatin (LIPITOR) 20 MG tablet TAKE 1 TABLET BY MOUTH ONCE DAILY  . blood glucose meter kit and supplies KIT Use to test blood sugar once daily. DX: E11.9  . Blood Glucose Monitoring Suppl (FREESTYLE LITE) DEVI   . chlorthalidone (HYGROTON) 25 MG tablet Take 1 tablet (25 mg total) by mouth daily.  . Cholecalciferol (VITAMIN D) 1000 UNITS capsule Take 1,000 Units by mouth daily.    Marland Kitchen ezetimibe (ZETIA) 10 MG tablet Take 10 mg by mouth daily.   Marland Kitchen glucose blood (COOL BLOOD GLUCOSE TEST STRIPS) test strip Use to check blood sugar once daily. DX: E11.9  . Lancets MISC Use to test blood sugar once daily. DX E11.09  . pantoprazole (PROTONIX) 40 MG tablet TAKE 1 TABLET BY MOUTH ONCE DAILY  . SYNJARDY XR 10-998 MG TB24 TAKE 1 TABLET BY MOUTH DAILY.  . tamsulosin (FLOMAX) 0.4 MG CAPS capsule TAKE 1 CAPSULE BY MOUTH DAILY.  Marland Kitchen telmisartan (MICARDIS) 40 MG tablet Take 1 tablet (40 mg total) by mouth daily.   No facility-administered encounter medications on file as of 05/11/2017.     Functional Status: In your present state of health, do you have any difficulty performing the following activities: 05/11/2017 01/16/2017  Hearing? N N  Vision? N N  Difficulty concentrating or making decisions? N N  Walking or climbing stairs? N N  Dressing or bathing? N N  Doing errands, shopping? N N  Preparing Food and eating ? N -  Using the Toilet? N -  In the past six months, have you accidently leaked urine? N -  Do you have problems with loss of bowel control? N -  Managing your Medications? Y -  Managing your Finances? Y -  Housekeeping or managing your Housekeeping? Y -  Some recent data might be hidden     Fall/Depression Screening: Fall Risk  05/11/2017 01/16/2017 11/24/2016  Falls in the past year? No Yes No  Number falls in past yr: - 1 -  Injury with Fall? No No -  Follow up - Falls prevention discussed -   PHQ 2/9 Scores 05/11/2017 01/16/2017 11/24/2016 11/24/2014 09/01/2014 09/01/2014 09/01/2014  PHQ - 2 Score 0 0 0 0 0 0 0   Assessment: Diabetes: uncontrolled but improving based on evidence from CBG's on meter, limited by dietary indiscretion and frequent desserts. - A1C 8.3% which is above goal of less than 7%.  - Weight is stable from last visit   Physical Activity- At goal of at least 150 minutes of moderate intensity exercise weekly. - Currently achieving 150 minutes of exercise/week  Nutrition- suboptimal, limited by travel and desire for sweets, especially desserts. Suboptimal understanding of glycemic indices and CHO effects on CBGs  Plan/Goals: - Counseled on s/sx hypoglycemia and appropriate treatment - Diet goal: Decrease amount of desserts, and continue to limit amount of biscuits for breakfast.  - Exercise goal: Continue to exercise at least 5x/week. If have time, increase to everyday, or increase intensity by increasing the incline on the treadmill. - Follow-up: With [employer] or [facility] per patient preference - Detailed goals and action plans are listed below  Anel Purohit L. Kyung Rudd, PharmD, [place] PGY1 Pharmacy Resident  [facility] CM Care Plan Problem One     Most Recent Value  Care Plan Problem One  A1C>7%  Role Documenting the Problem One  Clinical Pharmacist  Care Plan for Problem One  Active  [facility] Long Term Goal   Over the next 90 days patient will increase exercise by walking on treadmill for 30 mins on days he is not working (5 times/week) and increase the incline on the treadmill at least 2x/week as evidenced by patient report and A1C  [facility] Long Term Goal Start Date  05/11/17  [facility] Long Term Goal Met Date  08/08/17  Interventions for Problem One Long Term  Goal  Counseled on healthy choices and ways to increase the intensity of his exercise    [facility] CM Care Plan Problem Two     Most Recent Value  Care Plan Problem Two  Not checking blood sugars consistently  Role Documenting the Problem Two  Clinical Pharmacist  Care Plan for Problem Two  Active  Interventions for Problem Two Long Term Goal   Counseled on the important of checking blood sugars and being aware of the way his food affects his sugar   [facility] Long Term Goal  Over the next 90 days, will increase his fasting blood glucose checks to 5-7 times per week and his post-prandial blood glucose checks to 2x/week  [facility] Long Term Goal Start Date  05/11/17

## 2017-05-11 NOTE — Patient Outreach (Signed)
[place] [facility]) Care Management  05/11/2017  Dale Anderson 1930/06/07 [id number]   Spoke to patient in person advising that disease self-management services will be transitioned from the Link To Wellness program to either [facility] or [facility] in 2019. Ensured that he has completed the health risk assessment on the [email or web address] website. Also advised that a letter will be mailed to the home residence with details of this transition.            Will close case to Link To Wellness diabetes program  Beverlee Nims L. Kyung Rudd, PharmD, [place] PGY1 Pharmacy Resident

## 2017-05-26 ENCOUNTER — Other Ambulatory Visit: Payer: Self-pay | Admitting: Internal Medicine

## 2017-05-26 DIAGNOSIS — L309 Dermatitis, unspecified: Secondary | ICD-10-CM

## 2017-05-26 MED FILL — SYNJARDY XR 5-1000 MG TB24: 5-1000 | 30 days supply | Qty: 30 | Fill #1

## 2017-05-26 MED FILL — FLUOCINONIDE 0.05% SOLUTION: 0.05 | 30 days supply | Qty: 60 | Fill #0

## 2017-06-24 ENCOUNTER — Other Ambulatory Visit: Payer: Self-pay | Admitting: Internal Medicine

## 2017-06-24 DIAGNOSIS — E118 Type 2 diabetes mellitus with unspecified complications: Secondary | ICD-10-CM

## 2017-06-24 DIAGNOSIS — I10 Essential (primary) hypertension: Secondary | ICD-10-CM

## 2017-06-24 MED FILL — TAMSULOSIN HCL 0.4 MG CAP: 0.4 | 90 days supply | Qty: 90 | Fill #1

## 2017-06-24 MED FILL — ALPRAZolam 0.5 MG TABS: 0.5 | 30 days supply | Qty: 30 | Fill #1

## 2017-07-01 ENCOUNTER — Other Ambulatory Visit: Payer: Self-pay | Admitting: Internal Medicine

## 2017-07-01 DIAGNOSIS — I10 Essential (primary) hypertension: Secondary | ICD-10-CM

## 2017-07-01 DIAGNOSIS — E118 Type 2 diabetes mellitus with unspecified complications: Secondary | ICD-10-CM

## 2017-07-02 ENCOUNTER — Ambulatory Visit (INDEPENDENT_AMBULATORY_CARE_PROVIDER_SITE_OTHER): Payer: 59 | Admitting: Internal Medicine

## 2017-07-02 ENCOUNTER — Encounter: Payer: Self-pay | Admitting: Internal Medicine

## 2017-07-02 ENCOUNTER — Other Ambulatory Visit (INDEPENDENT_AMBULATORY_CARE_PROVIDER_SITE_OTHER): Payer: 59

## 2017-07-02 VITALS — BP 140/70 | HR 70 | Temp 97.5°F | Resp 16 | Ht 71.0 in | Wt 164.2 lb

## 2017-07-02 DIAGNOSIS — E118 Type 2 diabetes mellitus with unspecified complications: Secondary | ICD-10-CM | POA: Diagnosis not present

## 2017-07-02 DIAGNOSIS — I10 Essential (primary) hypertension: Secondary | ICD-10-CM

## 2017-07-02 DIAGNOSIS — N183 Chronic kidney disease, stage 3 unspecified: Secondary | ICD-10-CM | POA: Insufficient documentation

## 2017-07-02 LAB — BASIC METABOLIC PANEL
BUN: 33 mg/dL — ABNORMAL HIGH (ref 6–23)
CALCIUM: 9.6 mg/dL (ref 8.4–10.5)
CHLORIDE: 102 meq/L (ref 96–112)
CO2: 31 meq/L (ref 19–32)
Creatinine, Ser: 1.61 mg/dL — ABNORMAL HIGH (ref 0.40–1.50)
GFR: 52.7 mL/min — ABNORMAL LOW (ref >60.00)
GLUCOSE: 163 mg/dL — AB (ref 70–99)
Potassium: 3.6 mEq/L (ref 3.5–5.1)
SODIUM: 142 meq/L (ref 135–145)

## 2017-07-02 LAB — HEMOGLOBIN A1C: Hgb A1c MFr Bld: 8.1 % — ABNORMAL HIGH (ref 4.6–6.5)

## 2017-07-02 MED ORDER — EMPAGLIFLOZIN-METFORMIN HCL ER 5-1000 MG PO TB24: 1.0000 | ORAL_TABLET | Freq: Every day | ORAL | 1 refills | Status: DC

## 2017-07-02 MED ORDER — TELMISARTAN 40 MG PO TABS: 40.0000 mg | ORAL_TABLET | Freq: Every day | ORAL | 1 refills | Status: DC

## 2017-07-02 MED ORDER — CHLORTHALIDONE 25 MG PO TABS: 25.0000 mg | ORAL_TABLET | Freq: Every day | ORAL | 1 refills | Status: DC

## 2017-07-02 MED FILL — SYNJARDY XR 5-1,000 MG TAB: 5-1000 | 90 days supply | Qty: 90 | Fill #0

## 2017-07-02 MED FILL — TELMISARTAN 40 MG TABLET: 40 | 90 days supply | Qty: 90 | Fill #0

## 2017-07-02 MED FILL — CHLORTHALIDONE 25 MG TABS: 25 | 90 days supply | Qty: 90 | Fill #0

## 2017-07-02 NOTE — Progress Notes (Signed)
Subjective:  Patient ID: Dale Anderson, male    DOB: 27-Jul-1929  Age: 84 y.o. MRN: [id number]  CC: Hypertension and Diabetes   HPI Dale Anderson presents for f/up - He feels well and offers no complaints.  He tells me that his blood pressure and his blood sugars have been well controlled.  Outpatient Medications Prior to Visit  Medication Sig Dispense Refill  . ALPRAZolam (XANAX) 0.5 MG tablet TAKE 1 TABLET BY MOUTH DAILY AS NEEDED FOR ANXIETY 30 tablet 3  . aspirin (ASPIRIN LOW DOSE) 81 MG EC tablet TAKE 1 TABLET BY MOUTH EVERY DAY 90 tablet 3  . atorvastatin (LIPITOR) 20 MG tablet TAKE 1 TABLET BY MOUTH ONCE DAILY 90 tablet 1  . blood glucose meter kit and supplies KIT Use to test blood sugar once daily. DX: E11.9 1 each 0  . Blood Glucose Monitoring Suppl (FREESTYLE LITE) DEVI   0  . Cholecalciferol (VITAMIN D) 1000 UNITS capsule Take 1,000 Units by mouth daily.      Marland Kitchen ezetimibe (ZETIA) 10 MG tablet Take 10 mg by mouth daily.   3  . fluocinonide (LIDEX) 0.05 % external solution APPLY TO AFFECTED AREA AS DIRECTED 60 mL 2  . glucose blood (COOL BLOOD GLUCOSE TEST STRIPS) test strip Use to check blood sugar once daily. DX: E11.9 100 each 3  . Lancets MISC Use to test blood sugar once daily. DX E11.09 100 each 3  . pantoprazole (PROTONIX) 40 MG tablet TAKE 1 TABLET BY MOUTH ONCE DAILY 90 tablet 1  . tamsulosin (FLOMAX) 0.4 MG CAPS capsule TAKE 1 CAPSULE BY MOUTH DAILY. 90 capsule 1  . chlorthalidone (HYGROTON) 25 MG tablet Take 1 tablet (25 mg total) by mouth daily. 90 tablet 1  . SYNJARDY XR 10-998 MG TB24 TAKE 1 TABLET BY MOUTH DAILY. 30 tablet 1  . telmisartan (MICARDIS) 40 MG tablet Take 1 tablet (40 mg total) by mouth daily. 90 tablet 3   No facility-administered medications prior to visit.     ROS Review of Systems  Constitutional: Negative.  Negative for appetite change, diaphoresis, fatigue and unexpected weight change.  Eyes: Negative for visual disturbance.    Respiratory: Negative for cough, chest tightness, shortness of breath and wheezing.   Cardiovascular: Negative for chest pain, palpitations and leg swelling.  Gastrointestinal: Negative for abdominal pain, constipation, diarrhea, nausea and vomiting.  Endocrine: Negative.  Negative for polydipsia, polyphagia and polyuria.  Genitourinary: Negative.  Negative for decreased urine volume and urgency.  Musculoskeletal: Negative.  Negative for back pain, myalgias and neck pain.  Skin: Negative.   Neurological: Negative.  Negative for dizziness, weakness, light-headedness and headaches.  Hematological: Negative for adenopathy. Does not bruise/bleed easily.  Psychiatric/Behavioral: Negative.     Objective:  BP 140/70 (BP Location: Left Arm, Patient Position: Sitting, Cuff Size: Normal)   Pulse 70   Temp (!) 97.5 F (36.4 C) (Oral)   Resp 16   Ht '5\' 11"'  (1.803 m)   Wt 164 lb 4 oz (74.5 kg)   SpO2 95%   BMI 22.91 kg/m   BP Readings from Last 3 Encounters:  07/02/17 140/70  05/11/17 128/82  01/16/17 117/72    Wt Readings from Last 3 Encounters:  07/02/17 164 lb 4 oz (74.5 kg)  05/11/17 161 lb 9.6 oz (73.3 kg)  01/16/17 159 lb 12.8 oz (72.5 kg)    Physical Exam  Constitutional: He is oriented to person, place, and time. No distress.  HENT:  Mouth/Throat:  Oropharynx is clear and moist. No oropharyngeal exudate.  Eyes: Conjunctivae are normal. Left eye exhibits no discharge. No scleral icterus.  Neck: Normal range of motion. Neck supple. No JVD present. No thyromegaly present.  Cardiovascular: Normal rate, regular rhythm and normal heart sounds. Exam reveals no gallop.  No murmur heard. Pulmonary/Chest: Effort normal and breath sounds normal. No respiratory distress. He has no wheezes. He has no rales.  Abdominal: Soft. Bowel sounds are normal. He exhibits no mass. There is no tenderness. There is no guarding.  Musculoskeletal: Normal range of motion. He exhibits no edema,  tenderness or deformity.  Neurological: He is alert and oriented to person, place, and time.  Skin: Skin is dry. No rash noted. He is not diaphoretic. No erythema. No pallor.  Vitals reviewed.   Lab Results  Component Value Date   WBC 5.1 11/24/2016   HGB 13.5 11/24/2016   HCT 39.6 11/24/2016   PLT 203.0 11/24/2016   GLUCOSE 163 (H) 07/02/2017   CHOL 160 11/24/2016   TRIG 129.0 11/24/2016   HDL 43.70 11/24/2016   LDLCALC 90 11/24/2016   ALT 16 11/24/2016   AST 18 11/24/2016   NA 142 07/02/2017   K 3.6 07/02/2017   CL 102 07/02/2017   CREATININE 1.61 (H) 07/02/2017   BUN 33 (H) 07/02/2017   CO2 31 07/02/2017   TSH 3.35 11/24/2016   PSA 2.55 08/21/2015   HGBA1C 8.1 (H) 07/02/2017   MICROALBUR 1.9 11/24/2016    Dg Chest 2 View  Result Date: 03/20/2015 CLINICAL DATA:  Four day history of cough and congestion EXAM: CHEST  2 VIEW COMPARISON:  September 12, 2013 FINDINGS: Rounded opacities in each lung base are suggestive of nipple shadows. There is no edema or consolidation. Heart size and pulmonary vascularity are normal. No adenopathy. There is degenerative change in the thoracic spine. IMPRESSION: Probable nipple shadows on each side. Advise repeat study with nipple markers to confirm. No edema or consolidation. Electronically Signed   By: Lowella Grip III M.D.   On: 03/20/2015 15:20    Assessment & Plan:   Riely was seen today for hypertension and diabetes.  Diagnoses and all orders for this visit:  Type 2 diabetes mellitus with complication, without long-term current use of insulin ([place])- His A1c is at 8.1%.  Considering his age his blood sugars are adequately well controlled.  He has had a slight decrease in his renal function so we will continue empagliflozin and metformin at the current doses. -     Basic metabolic panel; Future -     Hemoglobin A1c; Future -     Empagliflozin-Metformin HCl ER (SYNJARDY XR) 10-998 MG TB24; Take 1 tablet by mouth daily. -      telmisartan (MICARDIS) 40 MG tablet; Take 1 tablet (40 mg total) by mouth daily.  Essential hypertension- His blood pressure is very well controlled but he has developed worsening renal insufficiency with an elevated BUN.  I therefore asked him to stop taking the thiazide diuretic.  Will continue the ARB at the current dose. -     Basic metabolic panel; Future -     Discontinue: chlorthalidone (HYGROTON) 25 MG tablet; Take 1 tablet (25 mg total) by mouth daily. -     telmisartan (MICARDIS) 40 MG tablet; Take 1 tablet (40 mg total) by mouth daily.  Chronic renal disease, stage 3, moderately decreased glomerular filtration rate (GFR) between 30-59 mL/min/1.73 square meter ([place])- His GFR is down to 35 and he has  an elevated BUN consistent with prerenal azotemia.  Will therefore discontinue the thiazide diuretic.  Will continue the ARB at the current dose.  He agrees to avoid nephrotoxic agents.   I have discontinued [name]. Hibbitts's chlorthalidone and chlorthalidone. I have also changed his SYNJARDY XR to Empagliflozin-Metformin HCl ER. Additionally, I am having him maintain his Vitamin D, aspirin, blood glucose meter kit and supplies, Lancets, glucose blood, ezetimibe, FREESTYLE LITE, atorvastatin, pantoprazole, tamsulosin, ALPRAZolam, fluocinonide, and telmisartan.  Meds ordered this encounter  Medications  . DISCONTD: chlorthalidone (HYGROTON) 25 MG tablet    Sig: Take 1 tablet (25 mg total) by mouth daily.    Dispense:  90 tablet    Refill:  1  . Empagliflozin-Metformin HCl ER (SYNJARDY XR) 10-998 MG TB24    Sig: Take 1 tablet by mouth daily.    Dispense:  90 tablet    Refill:  1  . telmisartan (MICARDIS) 40 MG tablet    Sig: Take 1 tablet (40 mg total) by mouth daily.    Dispense:  90 tablet    Refill:  1     Follow-up: Return in about 6 months (around 12/30/2017).  Scarlette Calico, MD

## 2017-07-02 NOTE — Patient Instructions (Signed)

## 2017-07-07 MED ORDER — EMPAGLIFLOZIN-METFORMIN HCL ER 5-1000 MG PO TB24: 1.0000 | ORAL_TABLET | Freq: Every day | ORAL | 1 refills | Status: DC

## 2017-07-07 MED ORDER — TELMISARTAN 40 MG PO TABS: 40.0000 mg | ORAL_TABLET | Freq: Every day | ORAL | 1 refills | Status: DC

## 2017-07-07 NOTE — Addendum Note (Signed)
Addended by: Aviva Signs M on: 07/07/2017 03:31 PM   Modules accepted: Orders

## 2017-07-20 ENCOUNTER — Other Ambulatory Visit: Payer: Self-pay | Admitting: Internal Medicine

## 2017-07-20 DIAGNOSIS — E118 Type 2 diabetes mellitus with unspecified complications: Secondary | ICD-10-CM

## 2017-07-20 DIAGNOSIS — E785 Hyperlipidemia, unspecified: Secondary | ICD-10-CM

## 2017-07-20 MED ORDER — ATORVASTATIN CALCIUM 20 MG PO TABS: 20.0000 mg | ORAL_TABLET | Freq: Every day | ORAL | 1 refills | Status: DC

## 2017-07-20 MED FILL — FREESTYLE LITE TEST STRIP: 90 days supply | Qty: 100 | Fill #1

## 2017-07-20 MED FILL — ATORVASTATIN 20 MG TABLET: 20 | 90 days supply | Qty: 90 | Fill #0

## 2017-08-03 ENCOUNTER — Other Ambulatory Visit: Payer: Self-pay | Admitting: Internal Medicine

## 2017-08-03 MED FILL — PANTOPRAZOLE SOD DR 40 MG T: 40 | 90 days supply | Qty: 90 | Fill #0

## 2017-09-07 ENCOUNTER — Ambulatory Visit: Payer: 59 | Admitting: Internal Medicine

## 2017-09-10 ENCOUNTER — Ambulatory Visit (INDEPENDENT_AMBULATORY_CARE_PROVIDER_SITE_OTHER): Payer: 59 | Admitting: Internal Medicine

## 2017-09-10 ENCOUNTER — Encounter: Payer: Self-pay | Admitting: Internal Medicine

## 2017-09-10 ENCOUNTER — Other Ambulatory Visit (INDEPENDENT_AMBULATORY_CARE_PROVIDER_SITE_OTHER): Payer: 59

## 2017-09-10 VITALS — BP 128/64 | HR 70 | Temp 97.8°F | Resp 14 | Ht 71.0 in | Wt 160.0 lb

## 2017-09-10 DIAGNOSIS — E119 Type 2 diabetes mellitus without complications: Secondary | ICD-10-CM | POA: Diagnosis not present

## 2017-09-10 DIAGNOSIS — I10 Essential (primary) hypertension: Secondary | ICD-10-CM | POA: Diagnosis not present

## 2017-09-10 DIAGNOSIS — N183 Chronic kidney disease, stage 3 unspecified: Secondary | ICD-10-CM

## 2017-09-10 DIAGNOSIS — E118 Type 2 diabetes mellitus with unspecified complications: Secondary | ICD-10-CM

## 2017-09-10 DIAGNOSIS — Z7984 Long term (current) use of oral hypoglycemic drugs: Secondary | ICD-10-CM | POA: Diagnosis not present

## 2017-09-10 DIAGNOSIS — H5203 Hypermetropia, bilateral: Secondary | ICD-10-CM | POA: Diagnosis not present

## 2017-09-10 DIAGNOSIS — H52223 Regular astigmatism, bilateral: Secondary | ICD-10-CM | POA: Diagnosis not present

## 2017-09-10 DIAGNOSIS — H2513 Age-related nuclear cataract, bilateral: Secondary | ICD-10-CM | POA: Diagnosis not present

## 2017-09-10 DIAGNOSIS — H524 Presbyopia: Secondary | ICD-10-CM | POA: Diagnosis not present

## 2017-09-10 LAB — URINALYSIS, ROUTINE W REFLEX MICROSCOPIC
Bilirubin Urine: NEGATIVE
HGB URINE DIPSTICK: NEGATIVE
KETONES UR: NEGATIVE
Leukocytes, UA: NEGATIVE
Nitrite: NEGATIVE
Specific Gravity, Urine: 1.015 (ref 1.000–1.030)
Total Protein, Urine: NEGATIVE
UROBILINOGEN UA: 0.2 (ref 0.0–1.0)
pH: 6.5 (ref 5.0–8.0)

## 2017-09-10 LAB — BASIC METABOLIC PANEL
BUN: 30 mg/dL — ABNORMAL HIGH (ref 6–23)
CALCIUM: 10 mg/dL (ref 8.4–10.5)
CO2: 26 mEq/L (ref 19–32)
CREATININE: 1.76 mg/dL — AB (ref 0.40–1.50)
Chloride: 104 mEq/L (ref 96–112)
GFR: 47.53 mL/min — ABNORMAL LOW (ref >60.00)
Glucose, Bld: 139 mg/dL — ABNORMAL HIGH (ref 70–99)
Potassium: 4.2 mEq/L (ref 3.5–5.1)
Sodium: 139 mEq/L (ref 135–145)

## 2017-09-10 LAB — HM DIABETES EYE EXAM

## 2017-09-10 LAB — HEMOGLOBIN A1C: HEMOGLOBIN A1C: 7.5 % — AB (ref 4.6–6.5)

## 2017-09-10 LAB — MICROALBUMIN / CREATININE URINE RATIO
Creatinine,U: 113.1 mg/dL
Microalb Creat Ratio: 0.8 mg/g (ref 0.0–30.0)
Microalb, Ur: 0.9 mg/dL (ref 0.0–1.9)

## 2017-09-10 NOTE — Progress Notes (Signed)
Subjective:  Patient ID: Dale Anderson, male    DOB: February 16, 1930  Age: 84 y.o. MRN: [id number]  CC: Hypertension and Diabetes   HPI Dale Anderson presents for f/up - He tells me that his blood sugar and blood pressures have been well controlled.  He feels well today and offers no complaints.  Outpatient Medications Prior to Visit  Medication Sig Dispense Refill  . ALPRAZolam (XANAX) 0.5 MG tablet TAKE 1 TABLET BY MOUTH DAILY AS NEEDED FOR ANXIETY 30 tablet 3  . aspirin (ASPIRIN LOW DOSE) 81 MG EC tablet TAKE 1 TABLET BY MOUTH EVERY DAY 90 tablet 3  . atorvastatin (LIPITOR) 20 MG tablet Take 1 tablet (20 mg total) by mouth daily. 90 tablet 1  . blood glucose meter kit and supplies KIT Use to test blood sugar once daily. DX: E11.9 1 each 0  . Blood Glucose Monitoring Suppl (FREESTYLE LITE) DEVI   0  . Cholecalciferol (VITAMIN D) 1000 UNITS capsule Take 1,000 Units by mouth daily.      . Empagliflozin-Metformin HCl ER (SYNJARDY XR) 10-998 MG TB24 Take 1 tablet by mouth daily. 90 tablet 1  . ezetimibe (ZETIA) 10 MG tablet Take 10 mg by mouth daily.   3  . fluocinonide (LIDEX) 0.05 % external solution APPLY TO AFFECTED AREA AS DIRECTED 60 mL 2  . glucose blood (COOL BLOOD GLUCOSE TEST STRIPS) test strip Use to check blood sugar once daily. DX: E11.9 100 each 3  . Lancets MISC Use to test blood sugar once daily. DX E11.09 100 each 3  . pantoprazole (PROTONIX) 40 MG tablet TAKE 1 TABLET BY MOUTH ONCE DAILY 90 tablet 1  . tamsulosin (FLOMAX) 0.4 MG CAPS capsule TAKE 1 CAPSULE BY MOUTH DAILY. 90 capsule 1  . telmisartan (MICARDIS) 40 MG tablet Take 1 tablet (40 mg total) by mouth daily. 90 tablet 1   No facility-administered medications prior to visit.     ROS Review of Systems  Constitutional: Negative for appetite change, diaphoresis, fatigue and unexpected weight change.  HENT: Negative.  Negative for trouble swallowing.   Eyes: Negative for visual disturbance.  Respiratory:  Negative for cough, chest tightness, shortness of breath and wheezing.   Cardiovascular: Negative for chest pain, palpitations and leg swelling.  Gastrointestinal: Negative for abdominal pain, diarrhea, nausea and vomiting.  Endocrine: Negative for polydipsia, polyphagia and polyuria.  Genitourinary: Negative.  Negative for difficulty urinating.  Musculoskeletal: Negative.  Negative for arthralgias and myalgias.  Skin: Negative.  Negative for color change and pallor.  Allergic/Immunologic: Negative.   Neurological: Negative.  Negative for dizziness, weakness and light-headedness.  Hematological: Negative for adenopathy. Does not bruise/bleed easily.  Psychiatric/Behavioral: Negative.     Objective:  BP 128/64 (BP Location: Left Arm, Patient Position: Sitting, Cuff Size: Normal)   Pulse 70   Temp 97.8 F (36.6 C) (Oral)   Resp 14   Ht _0  (1.803 m)   Wt 160 lb (72.6 kg)   SpO2 (!) 16%   BMI 22.32 kg/m   BP Readings from Last 3 Encounters:  09/10/17 128/64  07/02/17 140/70  05/11/17 128/82    Wt Readings from Last 3 Encounters:  09/10/17 160 lb (72.6 kg)  07/02/17 164 lb 4 oz (74.5 kg)  05/11/17 161 lb 9.6 oz (73.3 kg)    Physical Exam  Constitutional: He is oriented to person, place, and time. No distress.  HENT:  Mouth/Throat: Oropharynx is clear and moist. No oropharyngeal exudate.  Eyes: Conjunctivae are  normal. Left eye exhibits no discharge. No scleral icterus.  Neck: Normal range of motion. Neck supple. No JVD present. No thyromegaly present.  Cardiovascular: Normal rate, regular rhythm and normal heart sounds. Exam reveals no gallop.  No murmur heard. Pulmonary/Chest: Effort normal and breath sounds normal. No respiratory distress. He has no wheezes. He has no rales.  Abdominal: Soft. Bowel sounds are normal. He exhibits no distension and no mass. There is no tenderness. There is no guarding.  Musculoskeletal: Normal range of motion. He exhibits no edema,  tenderness or deformity.  Lymphadenopathy:    He has no cervical adenopathy.  Neurological: He is alert and oriented to person, place, and time.  Skin: Skin is warm and dry. No rash noted. He is not diaphoretic. No erythema. No pallor.  Vitals reviewed.   Lab Results  Component Value Date   WBC 5.1 11/24/2016   HGB 13.5 11/24/2016   HCT 39.6 11/24/2016   PLT 203.0 11/24/2016   GLUCOSE 139 (H) 09/10/2017   CHOL 160 11/24/2016   TRIG 129.0 11/24/2016   HDL 43.70 11/24/2016   LDLCALC 90 11/24/2016   ALT 16 11/24/2016   AST 18 11/24/2016   NA 139 09/10/2017   K 4.2 09/10/2017   CL 104 09/10/2017   CREATININE 1.76 (H) 09/10/2017   BUN 30 (H) 09/10/2017   CO2 26 09/10/2017   TSH 3.35 11/24/2016   PSA 2.55 08/21/2015   HGBA1C 7.5 (H) 09/10/2017   MICROALBUR 0.9 09/10/2017    Dg Chest 2 View  Result Date: 03/20/2015 CLINICAL DATA:  Four day history of cough and congestion EXAM: CHEST  2 VIEW COMPARISON:  September 12, 2013 FINDINGS: Rounded opacities in each lung base are suggestive of nipple shadows. There is no edema or consolidation. Heart size and pulmonary vascularity are normal. No adenopathy. There is degenerative change in the thoracic spine. IMPRESSION: Probable nipple shadows on each side. Advise repeat study with nipple markers to confirm. No edema or consolidation. Electronically Signed   By: Lowella Grip III M.D.   On: 03/20/2015 15:20    Assessment & Plan:   Massai was seen today for hypertension and diabetes.  Diagnoses and all orders for this visit:  Essential hypertension-his blood pressure is well controlled.  Electrolytes are normal.  Renal function is stable.  We will continue the current agents. -     Basic metabolic panel; Future -     Urinalysis, Routine w reflex microscopic; Future  Type 2 diabetes mellitus with complication, without long-term current use of insulin ([place])- His A1c is down to 7.5%.  Improvement noted.  Will continue metformin in the  SGL T-2 inhibitor. -     Basic metabolic panel; Future -     Hemoglobin A1c; Future -     Microalbumin / creatinine urine ratio; Future  Chronic renal disease, stage 3, moderately decreased glomerular filtration rate (GFR) between 30-59 mL/min/1.73 square meter ([place])- His creatinine clearance is holding steady at 31.5.  Will keep the metformin at the current lower dosage.  He will avoid nephrotoxic agents.   I am having [name]. Anastacio maintain his Vitamin D, aspirin, blood glucose meter kit and supplies, Lancets, glucose blood, ezetimibe, FREESTYLE LITE, tamsulosin, ALPRAZolam, fluocinonide, Empagliflozin-metFORMIN HCl ER, telmisartan, atorvastatin, and pantoprazole.  No orders of the defined types were placed in this encounter.    Follow-up: Return in about 6 months (around 03/13/2018).  Scarlette Calico, MD

## 2017-09-10 NOTE — Patient Instructions (Signed)

## 2017-10-07 MED FILL — CHLORTHALIDONE 25 MG TAB: 25 | 90 days supply | Qty: 90 | Fill #1

## 2017-10-07 MED FILL — SYNJARDY XR 5-1,000 MG TAB: 5-1000 | 90 days supply | Qty: 90 | Fill #1

## 2017-10-15 ENCOUNTER — Other Ambulatory Visit: Payer: Self-pay | Admitting: Internal Medicine

## 2017-10-15 MED FILL — FREESTYLE LITE TEST STRIP: 90 days supply | Qty: 100 | Fill #0

## 2017-10-17 ENCOUNTER — Encounter (HOSPITAL_COMMUNITY): Payer: Self-pay | Admitting: Emergency Medicine

## 2017-10-17 ENCOUNTER — Other Ambulatory Visit: Payer: Self-pay

## 2017-10-17 ENCOUNTER — Emergency Department (HOSPITAL_COMMUNITY): Payer: 59

## 2017-10-17 ENCOUNTER — Emergency Department (HOSPITAL_COMMUNITY)
Admission: EM | Admit: 2017-10-17 | Discharge: 2017-10-17 | Disposition: A | Discharge status: Home / Self Care | Payer: 59 | Attending: Emergency Medicine | Admitting: Emergency Medicine

## 2017-10-17 DIAGNOSIS — I129 Hypertensive chronic kidney disease with stage 1 through stage 4 chronic kidney disease, or unspecified chronic kidney disease: Secondary | ICD-10-CM | POA: Diagnosis not present

## 2017-10-17 DIAGNOSIS — N183 Chronic kidney disease, stage 3 (moderate): Secondary | ICD-10-CM | POA: Diagnosis not present

## 2017-10-17 DIAGNOSIS — E1122 Type 2 diabetes mellitus with diabetic chronic kidney disease: Secondary | ICD-10-CM | POA: Insufficient documentation

## 2017-10-17 DIAGNOSIS — M25561 Pain in right knee: Secondary | ICD-10-CM | POA: Diagnosis not present

## 2017-10-17 DIAGNOSIS — Z7982 Long term (current) use of aspirin: Secondary | ICD-10-CM | POA: Insufficient documentation

## 2017-10-17 DIAGNOSIS — Z79899 Other long term (current) drug therapy: Secondary | ICD-10-CM | POA: Insufficient documentation

## 2017-10-17 DIAGNOSIS — F419 Anxiety disorder, unspecified: Secondary | ICD-10-CM | POA: Insufficient documentation

## 2017-10-17 MED ORDER — DICLOFENAC SODIUM 1 % TD GEL: 4.0000 g | Freq: Four times a day (QID) | TRANSDERMAL | 0 refills | Status: DC

## 2017-10-17 MED ORDER — ACETAMINOPHEN 325 MG PO TABS
650.0000 mg | ORAL_TABLET | Freq: Once | ORAL | Status: AC
  Administered 2017-10-17: 650 mg via ORAL
  Filled 2017-10-17: qty 2

## 2017-10-17 NOTE — ED Triage Notes (Signed)
Pt. Stated, I went up underneath the house it twisted my rt. Knee and getting out of the [employer] this morning at work is when I really felt it.

## 2017-10-17 NOTE — ED Provider Notes (Signed)
[facility] EMERGENCY DEPARTMENT Provider Note   CSN: [id number] Arrival date & time: 10/17/17  0813     History   Chief Complaint Chief Complaint  Patient presents with  . Knee Injury    HPI Dale Anderson is a 84 y.o. male with a past medical history of diabetes, hypertension, hyperlipidemia, CKD who presents the emergency department today for right knee pain.  Patient states that last night he had his foot planted when he rotated and twisted his right knee.  Since that time he has been having pain along the lateral aspect of his right knee that is worse with ambulation as well as maximal flexion.  He notes that he has taken ibuprofen for his symptoms without any relief.  He feels he may have some swelling associated with this.  Patient denies any previous injury to the knee.  No previous surgeries.  He denies any open wounds, fevers, history of gout, trauma, numbness/tingling/weakness.  HPI  Past Medical History:  Diagnosis Date  . Adhesive capsulitis of right shoulder   . Anxiety   . Diabetes mellitus ([place])   . DJD (degenerative joint disease)   . GERD (gastroesophageal reflux disease)   . Hemorrhoid   . History of shingles   . Hyperlipidemia   . Hypertension   . Nodular prostate without urinary obstruction   . Recurrent bronchiectasis ([place])   . Tear of right rotator cuff     Patient Active Problem List   Diagnosis Date Noted  . Chronic renal disease, stage 3, moderately decreased glomerular filtration rate (GFR) between 30-59 mL/min/1.73 square meter (HCC) 07/02/2017  . Routine general medical examination at a health care facility 12/26/2013  . DDD (degenerative disc disease), cervical 12/26/2013  . Hypertension   . Hyperlipidemia with target LDL less than 100   . Type II diabetes mellitus with manifestations ([place])   . GERD (gastroesophageal reflux disease)   . DJD (degenerative joint disease)   . Anxiety     Past Surgical History:    Procedure Laterality Date  . COLONOSCOPY  1998  . HAND RECONSTRUCTION     right  . INGUINAL HERNIA REPAIR  2006   Right.  Dr. Bubba Camp  . ROTATOR CUFF REPAIR  1995   Left.  Dr. Wonda Olds  . SHOULDER ARTHROSCOPY WITH ROTATOR CUFF REPAIR AND SUBACROMIAL DECOMPRESSION  05/25/2012   Procedure: SHOULDER ARTHROSCOPY WITH ROTATOR CUFF REPAIR AND SUBACROMIAL DECOMPRESSION;  Surgeon: Lorn Junes, MD;  Location: [facility];  Service: Orthopedics;  Laterality: Right;  Right Shoulder Arthroscopy shoulder decompression subacromial partial acromioplasty with coracoaromial release, arthroscopy shoulder distal claviculectomy, arthroscopy shoulder with rotator cuff repai        Home Medications    Prior to Admission medications   Medication Sig Start Date End Date Taking? Authorizing Provider  ALPRAZolam Duanne Moron) 0.5 MG tablet TAKE 1 TABLET BY MOUTH DAILY AS NEEDED FOR ANXIETY 01/21/17   [name], MD  aspirin (ASPIRIN LOW DOSE) 81 MG EC tablet TAKE 1 TABLET BY MOUTH EVERY DAY 11/12/12   Noralee Space, MD  atorvastatin (LIPITOR) 20 MG tablet Take 1 tablet (20 mg total) by mouth daily. 07/20/17   [name], MD  blood glucose meter kit and supplies KIT Use to test blood sugar once daily. DX: E11.9 07/24/16   [name], MD  Blood Glucose Monitoring Suppl (FREESTYLE LITE) DEVI  07/24/16   [provider]  Cholecalciferol (VITAMIN D) 1000 UNITS capsule Take  1,000 Units by mouth daily.      [provider]  Empagliflozin-Metformin HCl ER (SYNJARDY XR) 10-998 MG TB24 Take 1 tablet by mouth daily. 07/07/17   [name], MD  ezetimibe (ZETIA) 10 MG tablet Take 10 mg by mouth daily.  06/12/16   [provider]  fluocinonide (LIDEX) 0.05 % external solution APPLY TO AFFECTED AREA AS DIRECTED 05/26/17   [name], MD  glucose blood (FREESTYLE LITE) test strip USE TO CHECK BLOOD SUGAR ONCE DAILY 10/15/17   [name], MD  Lancets MISC Use to test blood  sugar once daily. DX E11.09 07/24/16   [name], MD  pantoprazole (PROTONIX) 40 MG tablet TAKE 1 TABLET BY MOUTH ONCE DAILY 08/03/17   [name], MD  tamsulosin (FLOMAX) 0.4 MG CAPS capsule TAKE 1 CAPSULE BY MOUTH DAILY. 01/21/17   [name], MD  telmisartan (MICARDIS) 40 MG tablet Take 1 tablet (40 mg total) by mouth daily. 07/07/17   [name], MD    Family History Family History  Problem Relation Age of Onset  . Heart disease Father        MI at age 60  . Emphysema Other        cousin  . Colon cancer Neg Hx     Social History Social History   Tobacco Use  . Smoking status: Never Smoker  . Smokeless tobacco: Never Used  Substance Use Topics  . Alcohol use: No  . Drug use: No     Allergies   Lisinopril   Review of Systems Review of Systems  Constitutional: Negative for fever.  Musculoskeletal: Positive for arthralgias.  Skin: Negative for color change.  Neurological: Negative for weakness and numbness.     Physical Exam Updated Vital Signs BP (!) 148/75 (BP Location: Right Arm)   Pulse 78   Temp 97.7 F (36.5 C) (Oral)   Resp 18   Ht '5\' 11"'  (1.803 m)   Wt 73.5 kg (162 lb)   SpO2 99%   BMI 22.59 kg/m   Physical Exam  Constitutional: He appears well-developed and well-nourished.  HENT:  Head: Normocephalic and atraumatic.  Right Ear: External ear normal.  Left Ear: External ear normal.  Eyes: Conjunctivae are normal. Right eye exhibits no discharge. Left eye exhibits no discharge. No scleral icterus.  Cardiovascular:  Pulses:      Dorsalis pedis pulses are 2+ on the right side.       Posterior tibial pulses are 2+ on the right side.  No lower extremity swelling or edema. Calf symmetric in size b/l. Negative homan's test.   Pulmonary/Chest: Effort normal. No respiratory distress.  Musculoskeletal:       Right ankle: Normal. He exhibits normal range of motion. No tenderness. No lateral malleolus, no medial malleolus, no AITFL, no CF  ligament, no posterior TFL, no head of 5th metatarsal and no proximal fibula tenderness found. Achilles tendon normal.       Right lower leg: Normal. He exhibits no tenderness, no bony tenderness, no swelling, no edema, no deformity and no laceration.       Right foot: Normal. There is normal range of motion, no tenderness, no bony tenderness, normal capillary refill, no crepitus and no deformity.  Right knee: Appearance normal when compared to left knee. No obvious deformity. No skin swelling, erythema, heat, fluctuance or break of the skin. TTP over lateral joint line. Active and passive flexion intact to 90 degree's. Greater  flexion met by pain. Passive and active extension  intact without pain. No significant crepitus. Negative Lachman's test. Negative ballottement test. No varus or valgus laxity or locking. No TTP of hips or ankles. Compartments soft. Neurovascularly intact distally to site of injury.  Neurological: He is alert. He has normal strength. No sensory deficit.  Skin: Skin is warm, dry and intact. Capillary refill takes less than 2 seconds. No erythema. No pallor.  Psychiatric: He has a normal mood and affect.  Nursing note and vitals reviewed.    ED Treatments / Results  Labs (all labs ordered are listed, but only abnormal results are displayed) Labs Reviewed - No data to display  EKG None  Radiology Dg Knee Complete 4 Views Right  Result Date: 10/17/2017 CLINICAL DATA:  Right knee pain after twisting injury. EXAM: RIGHT KNEE - COMPLETE 4+ VIEW COMPARISON:  None. FINDINGS: No acute fracture or dislocation. No joint effusion. Minimal medial compartment joint space narrowing. Tiny marginal tricompartmental osteophytes. Bone mineralization is normal. Soft tissues are unremarkable. IMPRESSION: 1.  No acute osseous abnormality. 2. Minimal degenerative changes. Electronically Signed   By: Titus Dubin M.D.   On: 10/17/2017 10:23    Procedures Procedures (including critical  care time)  Medications Ordered in ED Medications  acetaminophen (TYLENOL) tablet 650 mg (650 mg Oral Given 10/17/17 1022)     Initial Impression / Assessment and Plan / ED Course  I have reviewed the triage vital signs and the nursing notes.  Pertinent labs & imaging results that were available during my care of the patient were reviewed by me and considered in my medical decision making (see chart for details).     84 year old male with a history of CKD presenting with right knee pain after foot was planted in a fixed position and he twisted his right knee last night.  Vital signs are reassuring.  He is without fever, joint swelling, overlying erythema or heat to make me concern for septic joint.  Patient without obvious ligamentous injury on exam.  Patient's x-ray is negative for any acute osseous abnormalities.  There is minimal degenerative changes seen.  I visualize these myself.  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  He states he would not like any strong pain medication in the department.  He was offered crutches and knee immobilizer but states he would only like a knee sleeve. Given patient CKD will avoid oral NSAID's. Will provide topical Voltaren. Will advise tylenol for pain. Advised PRICE therapy. referral to orthopedics given, [place] December. I advised the patient to follow-up with PCP this week. Specific return precautions discussed. Time was given for all questions to be answered. The patient verbalized understanding and agreement with plan. The patient appears safe for discharge home.  Final Clinical Impressions(s) / ED Diagnoses   Final diagnoses:  Acute pain of right knee    ED Discharge Orders        Ordered    diclofenac sodium (VOLTAREN) 1 % GEL  4 times daily     10/17/17 1110       Lorelle Gibbs 10/17/17 1110    Gareth Morgan, MD 10/19/17 2304

## 2017-10-17 NOTE — Discharge Instructions (Addendum)
Please read and follow all provided instructions.  You have been seen today for right knee pain  Tests performed today include: An x-ray of the affected area - does NOT show any broken bones or dislocations.  Vital signs. See below for your results today.   Home care instructions: -- *PRICE in the first 24-48 hours after injury: Protect (with brace, splint, sling), if given by your provider Rest Ice- Do not apply ice pack directly to your skin, place towel or similar between your skin and ice/ice pack. Apply ice for 20 min, then remove for 40 min while awake Compression- Wear brace, elastic bandage, splint as directed by your provider Elevate affected extremity above the level of your heart when not walking around for the first 24-48 hours   Given your documented history of kidney disease we will avoid NSAID's like ibuprofen. Please use Voltaren topical gel as prescribed. You can also use 1000 mg of tylenol every 6 hours as needed for pain. Do not exceed 4000 mg of tylenol in a 24 hour period. Read other medication labels to ensure they do not contain tylenol (acetaminophen) as well. Do not drink alcohol while taking Tylenol .  Follow-up instructions: Please follow-up with your primary care provider or the provided orthopedic physician (bone specialist) if you continue to have significant pain in 1 week. In this case you may have a more severe injury that requires further care.   Return instructions:  Please return if your toes or feet are numb or tingling, appear gray or blue, or you have severe pain (also elevate the leg and loosen splint or wrap if you were given one) You develop fever, joint swelling, heat/redness of the knee Please return to the Emergency Department if you experience worsening symptoms.  Please return if you have any other emergent concerns. Additional Information:  Your vital signs today were: BP (!) 148/75 (BP Location: Right Arm)    Pulse 78    Temp 97.7 F (36.5  C) (Oral)    Resp 18    Ht 5\' 11"  (1.803 m)    Wt 73.5 kg (162 lb)    SpO2 99%    BMI 22.59 kg/m  If your blood pressure (BP) was elevated above 135/85 this visit, please have this repeated by your doctor within one month. ---------------

## 2017-10-19 ENCOUNTER — Ambulatory Visit: Payer: Self-pay | Admitting: *Deleted

## 2017-10-19 NOTE — Telephone Encounter (Signed)
Pt  Reports  Symptoms of swelling and  Sensitivity  Of both  Feet also  Has  Some  Kidney insufficiency Has  Had  The  Symptoms for a  While  But  Worse  Over last Days pt is concerned it may be  From his  Diabetic medication. No  Availability  With Dr  Ronnald Ramp  Till  Next  Week.Appointment  Made  With Dr Raeford Razor  For tomorrow.    Reason for Disposition . [1] Numbness or tingling in both feet AND [2] new or increased  Answer Assessment - Initial Assessment Questions 1. SYMPTOM: "What's the main symptom you're concerned about?" (e.g., rash, sore, callus, drainage, numbness)    Feet  And  Toes  Swelling getting tight  Several  Days  Ago   2. LOCATION: "Where is the  _______ located?" (e.g., foot/toe, top/bottom, left/right)     Booth  Feet   3. ONSET: "When did the  ________  start?"       Has  Had  Mild swelling off and on  Worse  Several days  Ago  After injury to r  Knee  And  r  Side   After falling   4. PAIN: "Is there any pain?" If so, ask: "How bad is it?" (Scale: 1-10; mild, moderate, severe)    No pain    5. CAUSE: "What do you think is causing the symptoms?"     Pt thinks  It  May  Be  The  Medication    6. OTHER SYMPTOMS: "Do you have any other symptoms?" (e.g., fever, weakness)       No   7. PREGNANCY: "Is there any chance you are pregnant?" "When was your last menstrual period?"     n/a  Protocols used: DIABETES - [place]

## 2017-10-20 ENCOUNTER — Ambulatory Visit (INDEPENDENT_AMBULATORY_CARE_PROVIDER_SITE_OTHER): Payer: 59 | Admitting: Family Medicine

## 2017-10-20 ENCOUNTER — Encounter: Payer: Self-pay | Admitting: Family Medicine

## 2017-10-20 DIAGNOSIS — M5431 Sciatica, right side: Secondary | ICD-10-CM

## 2017-10-20 MED ORDER — PREDNISONE 5 MG PO TABS: ORAL_TABLET | ORAL | 0 refills | Status: DC

## 2017-10-20 MED FILL — predniSONE 5 MG TABS: 5 | 6 days supply | Qty: 21 | Fill #0

## 2017-10-20 NOTE — Progress Notes (Signed)
Dale Anderson - 84 y.o. male MRN [id number]  Date of birth: 02-14-1930  SUBJECTIVE:  Including CC & ROS.  Chief Complaint  Patient presents with  . Knee Pain    Dale Anderson is a 84 y.o. male that is presenting with right knee pain. He fell when he is lifting a garbage can, slipped on 10/17/17. He was evalated at the ER. He has not been taking anything for the pain. Pain is mild to severe when he ambulates. Denies tingling. Admits to weakness and sharp pain.The pain is located on the posterior aspect of his knee. He also has pain in the posterior aspect of his thigh and lower leg. Pain is worse with walking and lying down. Also feels pain in his right lower back.   Independent review of the right knee xray from 4/27 shows minimal degenerative changes.     Review of Systems  Constitutional: Negative for fever.  HENT: Negative for congestion.   Respiratory: Negative for cough.   Cardiovascular: Negative for chest pain.  Gastrointestinal: Negative for abdominal pain.  Musculoskeletal: Positive for gait problem.  Skin: Negative for color change.  Neurological: Negative for weakness.  Hematological: Negative for adenopathy.  Psychiatric/Behavioral: Negative for agitation.    HISTORY: Past Medical, Surgical, Social, and Family History Reviewed & Updated per EMR.   Pertinent Historical Findings include:  Past Medical History:  Diagnosis Date  . Adhesive capsulitis of right shoulder   . Anxiety   . Diabetes mellitus ([place])   . DJD (degenerative joint disease)   . GERD (gastroesophageal reflux disease)   . Hemorrhoid   . History of shingles   . Hyperlipidemia   . Hypertension   . Nodular prostate without urinary obstruction   . Recurrent bronchiectasis ([place])   . Tear of right rotator cuff     Past Surgical History:  Procedure Laterality Date  . COLONOSCOPY  1998  . HAND RECONSTRUCTION     right  . INGUINAL HERNIA REPAIR  2006   Right.  Dr. Bubba Camp  . ROTATOR CUFF  REPAIR  1995   Left.  Dr. Wonda Olds  . SHOULDER ARTHROSCOPY WITH ROTATOR CUFF REPAIR AND SUBACROMIAL DECOMPRESSION  05/25/2012   Procedure: SHOULDER ARTHROSCOPY WITH ROTATOR CUFF REPAIR AND SUBACROMIAL DECOMPRESSION;  Surgeon: Lorn Junes, MD;  Location: [facility];  Service: Orthopedics;  Laterality: Right;  Right Shoulder Arthroscopy shoulder decompression subacromial partial acromioplasty with coracoaromial release, arthroscopy shoulder distal claviculectomy, arthroscopy shoulder with rotator cuff repai    Allergies  Allergen Reactions  . Lisinopril Cough    Family History  Problem Relation Age of Onset  . Heart disease Father        MI at age 45  . Emphysema Other        cousin  . Colon cancer Neg Hx      Social History   Socioeconomic History  . Marital status: Married    Spouse name: linda Janvier  . Number of children: Not on file  . Years of education: Not on file  . Highest education level: Not on file  Occupational History  . Occupation: works for Psychologist, prison and probation services: [employer]  . Financial resource strain: Not on file  . Food insecurity:    Worry: Not on file    Inability: Not on file  . Transportation needs:    Medical: Not on file    Non-medical: Not on file  Tobacco Use  .  Smoking status: Never Smoker  . Smokeless tobacco: Never Used  Substance and Sexual Activity  . Alcohol use: No  . Drug use: No  . Sexual activity: Yes  Lifestyle  . Physical activity:    Days per week: Not on file    Minutes per session: Not on file  . Stress: Not on file  Relationships  . Social connections:    Talks on phone: Not on file    Gets together: Not on file    Attends religious service: Not on file    Active member of club or organization: Not on file    Attends meetings of clubs or organizations: Not on file    Relationship status: Not on file  . Intimate partner violence:    Fear of current or ex partner: Not on  file    Emotionally abused: Not on file    Physically abused: Not on file    Forced sexual activity: Not on file  Other Topics Concern  . Not on file  Social History Narrative  . Not on file     PHYSICAL EXAM:  VS: BP (!) 116/58 (BP Location: Left Arm, Patient Position: Sitting, Cuff Size: Normal)   Pulse 76   Temp 97.9 F (36.6 C) (Oral)   Ht 5\' 11"  (1.803 m)   Wt 162 lb (73.5 kg)   SpO2 96%   BMI 22.59 kg/m  Physical Exam Gen: NAD, alert, cooperative with exam, well-appearing ENT: normal lips, normal nasal mucosa,  Eye: normal EOM, normal conjunctiva and lids CV:  no edema, +2 pedal pulses   Resp: no accessory muscle use, non-labored,  GI: no masses or tenderness, no hernia  Skin: no rashes, no areas of induration  Neuro: normal tone, normal sensation to touch Psych:  normal insight, alert and oriented MSK:  RIght Knee: Normal to inspection with no erythema or effusion or obvious bony abnormalities. Palpation normal with no warmth, joint line tenderness, patellar tenderness, or condyle tenderness. ROM full in flexion and extension and lower leg rotation. Ligaments with solid consistent endpoints including  LCL, MCL. Negative Mcmurray's Non painful patellar compression. Patellar glide without crepitus. Patellar and quadriceps tendons unremarkable. Hamstring and quadriceps strength is normal.  Back:  Positive SLR b/l  Neurovascularly intact   Limited ultrasound: RIght knee:  Mild to moderate effusion in the SPP  Mild medial joint space narrowing  Normal QT and PT  Normal lateral joint space  Normal musculature  No Baker's cyst   Summary: knee effusion   Ultrasound and interpretation by Clearance Coots, MD             ASSESSMENT & PLAN:   Sciatica It appears on exam that his symptoms are more consistent with sciatica than originating from his knee. The effusion is likely chronic but exam was reassuring. - prednisone  - counseled on HEP  -  counseled on supportive care - if no improvement consider PT vs gabapentin

## 2017-10-20 NOTE — Patient Instructions (Signed)
Nice to meet you Please try the medication I have provided you  Please try the exercises  Please follow up with me in 3-4 weeks if your pain hasn't improved.

## 2017-10-21 DIAGNOSIS — M5430 Sciatica, unspecified side: Secondary | ICD-10-CM | POA: Insufficient documentation

## 2017-10-21 NOTE — Assessment & Plan Note (Signed)
It appears on exam that his symptoms are more consistent with sciatica than originating from his knee. The effusion is likely chronic but exam was reassuring. - prednisone  - counseled on HEP  - counseled on supportive care - if no improvement consider PT vs gabapentin

## 2017-10-26 ENCOUNTER — Inpatient Hospital Stay: Payer: 59 | Admitting: Internal Medicine

## 2017-10-27 ENCOUNTER — Ambulatory Visit (INDEPENDENT_AMBULATORY_CARE_PROVIDER_SITE_OTHER): Payer: 59 | Admitting: Internal Medicine

## 2017-10-27 ENCOUNTER — Encounter

## 2017-10-27 ENCOUNTER — Encounter: Payer: Self-pay | Admitting: Internal Medicine

## 2017-10-27 VITALS — BP 124/54 | HR 64 | Temp 97.8°F | Resp 16 | Ht 71.0 in | Wt 160.0 lb

## 2017-10-27 DIAGNOSIS — N183 Chronic kidney disease, stage 3 unspecified: Secondary | ICD-10-CM

## 2017-10-27 DIAGNOSIS — I10 Essential (primary) hypertension: Secondary | ICD-10-CM | POA: Diagnosis not present

## 2017-10-27 DIAGNOSIS — M8949 Other hypertrophic osteoarthropathy, multiple sites: Secondary | ICD-10-CM

## 2017-10-27 DIAGNOSIS — M150 Primary generalized (osteo)arthritis: Secondary | ICD-10-CM

## 2017-10-27 DIAGNOSIS — E118 Type 2 diabetes mellitus with unspecified complications: Secondary | ICD-10-CM | POA: Diagnosis not present

## 2017-10-27 DIAGNOSIS — M159 Polyosteoarthritis, unspecified: Secondary | ICD-10-CM

## 2017-10-27 NOTE — Patient Instructions (Signed)

## 2017-10-27 NOTE — Progress Notes (Signed)
 Subjective:  Patient ID: Dale Anderson, male    DOB: 04/25/1930  Age: 84 y.o. MRN: [id number]  CC: Osteoarthritis   HPI Dale Anderson presents for f/up - he has had a few musculoskeletal complaints recently.  He had a fall and thought he injured his right knee but the pain and swelling has resolved.  He said a few episodes of low back pain but he also says the low back pain has resolved.  He otherwise feels well today and offers no complaints.  Outpatient Medications Prior to Visit  Medication Sig Dispense Refill  . ALPRAZolam (XANAX) 0.5 MG tablet TAKE 1 TABLET BY MOUTH DAILY AS NEEDED FOR ANXIETY 30 tablet 3  . aspirin (ASPIRIN LOW DOSE) 81 MG EC tablet TAKE 1 TABLET BY MOUTH EVERY DAY 90 tablet 3  . atorvastatin (LIPITOR) 20 MG tablet Take 1 tablet (20 mg total) by mouth daily. 90 tablet 1  . blood glucose meter kit and supplies KIT Use to test blood sugar once daily. DX: E11.9 1 each 0  . Blood Glucose Monitoring Suppl (FREESTYLE LITE) DEVI   0  . Cholecalciferol (VITAMIN D) 1000 UNITS capsule Take 1,000 Units by mouth daily.      . diclofenac sodium (VOLTAREN) 1 % GEL Apply 4 g topically 4 (four) times daily. 100 g 0  . Empagliflozin-Metformin HCl ER (SYNJARDY XR) 10-998 MG TB24 Take 1 tablet by mouth daily. 90 tablet 1  . ezetimibe (ZETIA) 10 MG tablet Take 10 mg by mouth daily.   3  . fluocinonide (LIDEX) 0.05 % external solution APPLY TO AFFECTED AREA AS DIRECTED 60 mL 2  . glucose blood (FREESTYLE LITE) test strip USE TO CHECK BLOOD SUGAR ONCE DAILY 100 each 1  . Lancets MISC Use to test blood sugar once daily. DX E11.09 100 each 3  . pantoprazole (PROTONIX) 40 MG tablet TAKE 1 TABLET BY MOUTH ONCE DAILY 90 tablet 1  . tamsulosin (FLOMAX) 0.4 MG CAPS capsule TAKE 1 CAPSULE BY MOUTH DAILY. 90 capsule 1  . telmisartan (MICARDIS) 40 MG tablet Take 1 tablet (40 mg total) by mouth daily. 90 tablet 1  . predniSONE (DELTASONE) 5 MG tablet Take 6 pills for first day, 5 pills  second day, 4 pills third day, 3 pills fourth day, 2 pills the fifth day, and 1 pill sixth day. 21 tablet 0   No facility-administered medications prior to visit.     ROS Review of Systems  Constitutional: Negative.  Negative for chills, diaphoresis, fatigue and fever.  HENT: Negative.   Eyes: Negative.   Respiratory: Negative.  Negative for cough, chest tightness, shortness of breath and wheezing.   Cardiovascular: Negative.  Negative for chest pain, palpitations and leg swelling.  Gastrointestinal: Negative for abdominal pain, diarrhea, nausea and vomiting.  Endocrine: Negative.   Genitourinary: Negative.  Negative for difficulty urinating.  Musculoskeletal: Positive for arthralgias. Negative for back pain, joint swelling and myalgias.  Skin: Negative.  Negative for color change and pallor.  Allergic/Immunologic: Negative.   Neurological: Negative.   Hematological: Negative for adenopathy. Does not bruise/bleed easily.  Psychiatric/Behavioral: Negative.     Objective:  BP (!) 124/54 (BP Location: Left Arm, Patient Position: Sitting, Cuff Size: Normal)   Pulse 64   Temp 97.8 F (36.6 C) (Oral)   Resp 16   Ht 5' 11" (1.803 m)   Wt 160 lb (72.6 kg)   SpO2 94%   BMI 22.32 kg/m   BP Readings from Last 3   Encounters:  10/27/17 (!) 124/54  10/20/17 (!) 116/58  10/17/17 (!) 154/59    Wt Readings from Last 3 Encounters:  10/27/17 160 lb (72.6 kg)  10/20/17 162 lb (73.5 kg)  10/17/17 162 lb (73.5 kg)    Physical Exam  Constitutional: He is oriented to person, place, and time. No distress.  HENT:  Mouth/Throat: Oropharynx is clear and moist. No oropharyngeal exudate.  Eyes: Conjunctivae are normal. No scleral icterus.  Neck: Normal range of motion. Neck supple. No JVD present. No thyromegaly present.  Cardiovascular: Normal rate, regular rhythm and normal heart sounds. Exam reveals no gallop and no friction rub.  No murmur heard. Pulmonary/Chest: Effort normal and breath  sounds normal. No stridor. No respiratory distress. He has no wheezes. He has no rales.  Abdominal: Soft. Bowel sounds are normal. He exhibits no distension and no mass.  Musculoskeletal: Normal range of motion. He exhibits no edema, tenderness or deformity.       Right knee: Normal. He exhibits no swelling. No tenderness found.       Left knee: Normal. He exhibits no swelling. No tenderness found.       Lumbar back: Normal. He exhibits normal range of motion, no tenderness, no swelling, no edema and no deformity.  Lymphadenopathy:    He has no cervical adenopathy.  Neurological: He is alert and oriented to person, place, and time.  Skin: Skin is warm and dry. He is not diaphoretic.  Vitals reviewed.   Lab Results  Component Value Date   WBC 5.1 11/24/2016   HGB 13.5 11/24/2016   HCT 39.6 11/24/2016   PLT 203.0 11/24/2016   GLUCOSE 139 (H) 09/10/2017   CHOL 160 11/24/2016   TRIG 129.0 11/24/2016   HDL 43.70 11/24/2016   LDLCALC 90 11/24/2016   ALT 16 11/24/2016   AST 18 11/24/2016   NA 139 09/10/2017   K 4.2 09/10/2017   CL 104 09/10/2017   CREATININE 1.76 (H) 09/10/2017   BUN 30 (H) 09/10/2017   CO2 26 09/10/2017   TSH 3.35 11/24/2016   PSA 2.55 08/21/2015   HGBA1C 7.5 (H) 09/10/2017   MICROALBUR 0.9 09/10/2017    Dg Knee Complete 4 Views Right  Result Date: 10/17/2017 CLINICAL DATA:  Right knee pain after twisting injury. EXAM: RIGHT KNEE - COMPLETE 4+ VIEW COMPARISON:  None. FINDINGS: No acute fracture or dislocation. No joint effusion. Minimal medial compartment joint space narrowing. Tiny marginal tricompartmental osteophytes. Bone mineralization is normal. Soft tissues are unremarkable. IMPRESSION: 1.  No acute osseous abnormality. 2. Minimal degenerative changes. Electronically Signed   By: Titus Dubin M.D.   On: 10/17/2017 10:23    Assessment & Plan:   Dale Anderson was seen today for osteoarthritis.  Diagnoses and all orders for this visit:  Essential  hypertension- His blood pressure is adequately well controlled.  Primary osteoarthritis involving multiple joints- His symptoms are well controlled with the topical application of an NSAID.  He will take Tylenol if he has any additional pain.  Type 2 diabetes mellitus with complication, without long-term current use of insulin ([place])- His blood sugars have been adequately well controlled.  Chronic renal disease, stage 3, moderately decreased glomerular filtration rate (GFR) between 30-59- His recent renal function has been stable.  He is encouraged to avoid NSAIDs.  ML/min/1.73 square meter ([facility])   I have discontinued Dale Anderson. Bellew's predniSONE. I am also having him maintain his Vitamin D, aspirin, blood glucose meter kit and supplies, Lancets, ezetimibe, FREESTYLE LITE,  tamsulosin, ALPRAZolam, fluocinonide, Empagliflozin-metFORMIN HCl ER, telmisartan, atorvastatin, pantoprazole, glucose blood, and diclofenac sodium.  No orders of the defined types were placed in this encounter.    Follow-up: Return in about 3 months (around 01/27/2018).  Thomas Jones, MD 

## 2017-10-31 ENCOUNTER — Encounter: Payer: Self-pay | Admitting: Internal Medicine

## 2017-11-12 ENCOUNTER — Encounter: Payer: Self-pay | Admitting: Internal Medicine

## 2017-11-12 ENCOUNTER — Ambulatory Visit (INDEPENDENT_AMBULATORY_CARE_PROVIDER_SITE_OTHER)
Admission: RE | Admit: 2017-11-12 | Discharge: 2017-11-12 | Disposition: A | Discharge status: Home / Self Care | Payer: 59 | Source: Ambulatory Visit | Attending: Internal Medicine | Admitting: Internal Medicine

## 2017-11-12 ENCOUNTER — Ambulatory Visit (INDEPENDENT_AMBULATORY_CARE_PROVIDER_SITE_OTHER): Payer: 59 | Admitting: Internal Medicine

## 2017-11-12 VITALS — BP 118/70 | HR 61 | Temp 98.6°F | Resp 16 | Ht 71.0 in | Wt 156.2 lb

## 2017-11-12 DIAGNOSIS — R059 Cough, unspecified: Secondary | ICD-10-CM

## 2017-11-12 DIAGNOSIS — R05 Cough: Secondary | ICD-10-CM | POA: Diagnosis not present

## 2017-11-12 DIAGNOSIS — J988 Other specified respiratory disorders: Secondary | ICD-10-CM

## 2017-11-12 MED ORDER — HYDROCODONE-HOMATROPINE 5-1.5 MG/5ML PO SYRP: 5.0000 mL | ORAL_SOLUTION | Freq: Three times a day (TID) | ORAL | 0 refills | Status: DC | PRN

## 2017-11-12 MED ORDER — HYDROCODONE-HOMATROPINE 5-1.5 MG/5ML PO SYRP: 5.0000 mL | ORAL_SOLUTION | Freq: Three times a day (TID) | ORAL | 0 refills | Status: AC | PRN

## 2017-11-12 MED ORDER — AMOXICILLIN-POT CLAVULANATE 875-125 MG PO TABS: 1.0000 | ORAL_TABLET | Freq: Two times a day (BID) | ORAL | 0 refills | Status: AC

## 2017-11-12 MED FILL — AMOX-CLAV 875-125 MG TABLET: 875-125 | 10 days supply | Qty: 20 | Fill #0

## 2017-11-12 MED FILL — HYDROCODONE-HOMATROPINE SYR: 5-1.5 | 8 days supply | Qty: 120 | Fill #0

## 2017-11-12 NOTE — Patient Instructions (Signed)
Cough, Adult  Coughing is a reflex that clears your throat and your airways. Coughing helps to heal and protect your lungs. It is normal to cough occasionally, but a cough that happens with other symptoms or lasts a long time may be a sign of a condition that needs treatment. A cough may last only 2-3 weeks (acute), or it may last longer than 8 weeks (chronic).  What are the causes?  Coughing is commonly caused by:   Breathing in substances that irritate your lungs.   A viral or bacterial respiratory infection.   Allergies.   Asthma.   Postnasal drip.   Smoking.   Acid backing up from the stomach into the esophagus (gastroesophageal reflux).   Certain medicines.   Chronic lung problems, including COPD (or rarely, lung cancer).   Other medical conditions such as heart failure.    Follow these instructions at home:  Pay attention to any changes in your symptoms. Take these actions to help with your discomfort:   Take medicines only as told by your health care provider.  ? If you were prescribed an antibiotic medicine, take it as told by your health care provider. Do not stop taking the antibiotic even if you start to feel better.  ? Talk with your health care provider before you take a cough suppressant medicine.   Drink enough fluid to keep your urine clear or pale yellow.   If the air is dry, use a cold steam vaporizer or humidifier in your bedroom or your home to help loosen secretions.   Avoid anything that causes you to cough at work or at home.   If your cough is worse at night, try sleeping in a semi-upright position.   Avoid cigarette smoke. If you smoke, quit smoking. If you need help quitting, ask your health care provider.   Avoid caffeine.   Avoid alcohol.   Rest as needed.    Contact a health care provider if:   You have new symptoms.   You cough up pus.   Your cough does not get better after 2-3 weeks, or your cough gets worse.   You cannot control your cough with suppressant  medicines and you are losing sleep.   You develop pain that is getting worse or pain that is not controlled with pain medicines.   You have a fever.   You have unexplained weight loss.   You have night sweats.  Get help right away if:   You cough up blood.   You have difficulty breathing.   Your heartbeat is very fast.  This information is not intended to replace advice given to you by your health care provider. Make sure you discuss any questions you have with your health care provider.  Document Released: 12/06/2010 Document Revised: 11/15/2015 Document Reviewed: 08/16/2014  Elsevier Interactive Patient Education  2018 [employer]

## 2017-11-12 NOTE — Progress Notes (Signed)
Subjective:  Patient ID: Dale Anderson, male    DOB: 1929-07-21  Age: 84 y.o. MRN: [id number]  CC: Cough   HPI Dale Anderson presents for a 10-day history of cough that is productive of thick yellow phlegm with chills and runny nose.  He denies sore throat, fever, shortness of breath, or night sweats.  Outpatient Medications Prior to Visit  Medication Sig Dispense Refill  . ALPRAZolam (XANAX) 0.5 MG tablet TAKE 1 TABLET BY MOUTH DAILY AS NEEDED FOR ANXIETY 30 tablet 3  . aspirin (ASPIRIN LOW DOSE) 81 MG EC tablet TAKE 1 TABLET BY MOUTH EVERY DAY 90 tablet 3  . atorvastatin (LIPITOR) 20 MG tablet Take 1 tablet (20 mg total) by mouth daily. 90 tablet 1  . blood glucose meter kit and supplies KIT Use to test blood sugar once daily. DX: E11.9 1 each 0  . Blood Glucose Monitoring Suppl (FREESTYLE LITE) DEVI   0  . Cholecalciferol (VITAMIN D) 1000 UNITS capsule Take 1,000 Units by mouth daily.      . diclofenac sodium (VOLTAREN) 1 % GEL Apply 4 g topically 4 (four) times daily. 100 g 0  . Empagliflozin-Metformin HCl ER (SYNJARDY XR) 10-998 MG TB24 Take 1 tablet by mouth daily. 90 tablet 1  . ezetimibe (ZETIA) 10 MG tablet Take 10 mg by mouth daily.   3  . fluocinonide (LIDEX) 0.05 % external solution APPLY TO AFFECTED AREA AS DIRECTED 60 mL 2  . glucose blood (FREESTYLE LITE) test strip USE TO CHECK BLOOD SUGAR ONCE DAILY 100 each 1  . Lancets MISC Use to test blood sugar once daily. DX E11.09 100 each 3  . pantoprazole (PROTONIX) 40 MG tablet TAKE 1 TABLET BY MOUTH ONCE DAILY 90 tablet 1  . tamsulosin (FLOMAX) 0.4 MG CAPS capsule TAKE 1 CAPSULE BY MOUTH DAILY. 90 capsule 1  . telmisartan (MICARDIS) 40 MG tablet Take 1 tablet (40 mg total) by mouth daily. 90 tablet 1   No facility-administered medications prior to visit.     ROS Review of Systems  Constitutional: Positive for chills. Negative for fatigue and fever.  HENT: Negative.  Negative for facial swelling, sinus  pressure, sore throat and trouble swallowing.   Eyes: Negative.   Respiratory: Positive for cough. Negative for chest tightness, shortness of breath and wheezing.   Cardiovascular: Negative.  Negative for chest pain, palpitations and leg swelling.  Gastrointestinal: Negative for abdominal pain, constipation, diarrhea, nausea and vomiting.  Endocrine: Negative.   Genitourinary: Negative.  Negative for difficulty urinating.  Musculoskeletal: Negative.  Negative for back pain and neck pain.  Skin: Negative.   Neurological: Negative.  Negative for dizziness, weakness and light-headedness.  Hematological: Negative for adenopathy. Does not bruise/bleed easily.  Psychiatric/Behavioral: Negative.     Objective:  BP 118/70 (BP Location: Left Arm, Patient Position: Sitting, Cuff Size: Normal)   Pulse 61   Temp 98.6 F (37 C) (Oral)   Resp 16   Ht '5\' 11"'  (1.803 m)   Wt 156 lb 4 oz (70.9 kg)   SpO2 93%   BMI 21.79 kg/m   BP Readings from Last 3 Encounters:  11/12/17 118/70  10/27/17 (!) 124/54  10/20/17 (!) 116/58    Wt Readings from Last 3 Encounters:  11/12/17 156 lb 4 oz (70.9 kg)  10/27/17 160 lb (72.6 kg)  10/20/17 162 lb (73.5 kg)    Physical Exam  Constitutional: He is oriented to person, place, and time.  Non-toxic appearance. He does  not have a sickly appearance. He does not appear ill. No distress.  HENT:  Mouth/Throat: No oropharyngeal exudate.  Eyes: Conjunctivae are normal. No scleral icterus.  Neck: Normal range of motion. Neck supple. No JVD present. No thyromegaly present.  Cardiovascular: Normal rate, regular rhythm and normal heart sounds. Exam reveals no friction rub.  No murmur heard. Pulmonary/Chest: Effort normal. No accessory muscle usage. No tachypnea. No respiratory distress. He has no decreased breath sounds. He has no wheezes. He has no rhonchi. He has rales in the right lower field.  Abdominal: Soft. Bowel sounds are normal. He exhibits no mass. There is  no hepatosplenomegaly. There is no tenderness.  Musculoskeletal: Normal range of motion. He exhibits no edema, tenderness or deformity.  Lymphadenopathy:    He has no cervical adenopathy.  Neurological: He is alert and oriented to person, place, and time.  Skin: Skin is warm and dry. He is not diaphoretic.  Vitals reviewed.   Lab Results  Component Value Date   WBC 5.1 11/24/2016   HGB 13.5 11/24/2016   HCT 39.6 11/24/2016   PLT 203.0 11/24/2016   GLUCOSE 139 (H) 09/10/2017   CHOL 160 11/24/2016   TRIG 129.0 11/24/2016   HDL 43.70 11/24/2016   LDLCALC 90 11/24/2016   ALT 16 11/24/2016   AST 18 11/24/2016   NA 139 09/10/2017   K 4.2 09/10/2017   CL 104 09/10/2017   CREATININE 1.76 (H) 09/10/2017   BUN 30 (H) 09/10/2017   CO2 26 09/10/2017   TSH 3.35 11/24/2016   PSA 2.55 08/21/2015   HGBA1C 7.5 (H) 09/10/2017   MICROALBUR 0.9 09/10/2017    Dg Knee Complete 4 Views Right  Result Date: 10/17/2017 CLINICAL DATA:  Right knee pain after twisting injury. EXAM: RIGHT KNEE - COMPLETE 4+ VIEW COMPARISON:  None. FINDINGS: No acute fracture or dislocation. No joint effusion. Minimal medial compartment joint space narrowing. Tiny marginal tricompartmental osteophytes. Bone mineralization is normal. Soft tissues are unremarkable. IMPRESSION: 1.  No acute osseous abnormality. 2. Minimal degenerative changes. Electronically Signed   By: Dale Anderson M.D.   On: 10/17/2017 10:23    Assessment & Plan:   Dale Anderson was seen today for cough.  Diagnoses and all orders for this visit:  Cough- His chest x-ray is negative for mass or infiltrate. -     Discontinue: HYDROcodone-homatropine (HYCODAN) 5-1.5 MG/5ML syrup; Take 5 mLs by mouth every 8 (eight) hours as needed for up to 7 days for cough. -     DG Chest 2 View; Future -     HYDROcodone-homatropine (HYCODAN) 5-1.5 MG/5ML syrup; Take 5 mLs by mouth every 8 (eight) hours as needed for up to 7 days for cough.  RTI (respiratory tract  infection) -     amoxicillin-clavulanate (AUGMENTIN) 875-125 MG tablet; Take 1 tablet by mouth 2 (two) times daily for 10 days. -     Discontinue: HYDROcodone-homatropine (HYCODAN) 5-1.5 MG/5ML syrup; Take 5 mLs by mouth every 8 (eight) hours as needed for up to 7 days for cough. -     HYDROcodone-homatropine (HYCODAN) 5-1.5 MG/5ML syrup; Take 5 mLs by mouth every 8 (eight) hours as needed for up to 7 days for cough.   I am having [name]. Stork start on amoxicillin-clavulanate. I am also having him maintain his Vitamin D, aspirin, blood glucose meter kit and supplies, Lancets, ezetimibe, FREESTYLE LITE, tamsulosin, ALPRAZolam, fluocinonide, Empagliflozin-metFORMIN HCl ER, telmisartan, atorvastatin, pantoprazole, glucose blood, diclofenac sodium, and HYDROcodone-homatropine.  Meds ordered this encounter  Medications  . amoxicillin-clavulanate (AUGMENTIN) 875-125 MG tablet    Sig: Take 1 tablet by mouth 2 (two) times daily for 10 days.    Dispense:  20 tablet    Refill:  0  . DISCONTD: HYDROcodone-homatropine (HYCODAN) 5-1.5 MG/5ML syrup    Sig: Take 5 mLs by mouth every 8 (eight) hours as needed for up to 7 days for cough.    Dispense:  120 mL    Refill:  0  . HYDROcodone-homatropine (HYCODAN) 5-1.5 MG/5ML syrup    Sig: Take 5 mLs by mouth every 8 (eight) hours as needed for up to 7 days for cough.    Dispense:  120 mL    Refill:  0     Follow-up: Return in about 3 weeks (around 12/03/2017).  Scarlette Calico, MD

## 2017-11-25 MED FILL — TELMISARTAN 40 MG TABLET: 40 | 90 days supply | Qty: 90 | Fill #1

## 2017-11-25 MED FILL — PANTOPRAZOLE SOD DR 40 MG T: 40 | 90 days supply | Qty: 90 | Fill #1

## 2017-11-26 ENCOUNTER — Other Ambulatory Visit: Payer: Self-pay | Admitting: Internal Medicine

## 2017-11-26 DIAGNOSIS — R339 Retention of urine, unspecified: Secondary | ICD-10-CM

## 2017-11-26 MED FILL — ATORVASTATIN 20 MG TABLET: 20 | 90 days supply | Qty: 90 | Fill #1

## 2017-11-27 MED FILL — TAMSULOSIN HCL 0.4 MG CAP: 0.4 | 90 days supply | Qty: 90 | Fill #0

## 2018-02-26 ENCOUNTER — Other Ambulatory Visit: Payer: Self-pay | Admitting: Internal Medicine

## 2018-02-26 DIAGNOSIS — I10 Essential (primary) hypertension: Secondary | ICD-10-CM

## 2018-02-26 MED FILL — SYNJARDY XR 5-1,000 MG TAB: 5-1000 | 90 days supply | Qty: 90 | Fill #0

## 2018-02-26 MED FILL — FLUOCINONIDE 0.05 % SOLN: 0.05 | 30 days supply | Qty: 60 | Fill #1

## 2018-03-01 MED FILL — CHLORTHALIDONE 25 MG TAB: 25 | 90 days supply | Qty: 90 | Fill #0

## 2018-03-15 ENCOUNTER — Ambulatory Visit: Payer: 59 | Admitting: Internal Medicine

## 2018-04-13 ENCOUNTER — Other Ambulatory Visit: Payer: Self-pay | Admitting: Internal Medicine

## 2018-04-13 DIAGNOSIS — E118 Type 2 diabetes mellitus with unspecified complications: Secondary | ICD-10-CM

## 2018-04-13 DIAGNOSIS — I10 Essential (primary) hypertension: Secondary | ICD-10-CM

## 2018-04-13 MED FILL — TELMISARTAN 40 MG TABLET: 40 | 90 days supply | Qty: 90 | Fill #0

## 2018-04-19 ENCOUNTER — Other Ambulatory Visit: Payer: Self-pay | Admitting: Internal Medicine

## 2018-04-19 MED FILL — PANTOPRAZOLE SOD DR 40 MG T: 40 | 90 days supply | Qty: 90 | Fill #0

## 2018-05-03 ENCOUNTER — Ambulatory Visit: Payer: 59 | Admitting: Internal Medicine

## 2018-05-14 MED FILL — TAMSULOSIN HCL 0.4 MG CAP: 0.4 | 90 days supply | Qty: 90 | Fill #1

## 2018-05-24 ENCOUNTER — Encounter: Payer: Self-pay | Admitting: Internal Medicine

## 2018-05-24 ENCOUNTER — Ambulatory Visit (INDEPENDENT_AMBULATORY_CARE_PROVIDER_SITE_OTHER): Payer: 59 | Admitting: Internal Medicine

## 2018-05-24 ENCOUNTER — Other Ambulatory Visit (INDEPENDENT_AMBULATORY_CARE_PROVIDER_SITE_OTHER): Payer: 59

## 2018-05-24 VITALS — BP 138/78 | HR 68 | Temp 97.4°F | Resp 16 | Ht 71.0 in | Wt 160.2 lb

## 2018-05-24 DIAGNOSIS — I10 Essential (primary) hypertension: Secondary | ICD-10-CM

## 2018-05-24 DIAGNOSIS — J301 Allergic rhinitis due to pollen: Secondary | ICD-10-CM

## 2018-05-24 DIAGNOSIS — N183 Chronic kidney disease, stage 3 unspecified: Secondary | ICD-10-CM

## 2018-05-24 DIAGNOSIS — F419 Anxiety disorder, unspecified: Secondary | ICD-10-CM

## 2018-05-24 DIAGNOSIS — E785 Hyperlipidemia, unspecified: Secondary | ICD-10-CM

## 2018-05-24 DIAGNOSIS — Z0000 Encounter for general adult medical examination without abnormal findings: Secondary | ICD-10-CM

## 2018-05-24 DIAGNOSIS — E118 Type 2 diabetes mellitus with unspecified complications: Secondary | ICD-10-CM

## 2018-05-24 DIAGNOSIS — Z23 Encounter for immunization: Secondary | ICD-10-CM

## 2018-05-24 DIAGNOSIS — Z0001 Encounter for general adult medical examination with abnormal findings: Secondary | ICD-10-CM

## 2018-05-24 LAB — CBC WITH DIFFERENTIAL/PLATELET
BASOS ABS: 0 10*3/uL (ref 0.0–0.1)
BASOS PCT: 0.6 % (ref 0.0–3.0)
EOS ABS: 0.1 10*3/uL (ref 0.0–0.7)
Eosinophils Relative: 2.4 % (ref 0.0–5.0)
HEMATOCRIT: 41.8 % (ref 39.0–52.0)
Hemoglobin: 14.2 g/dL (ref 13.0–17.0)
LYMPHS ABS: 0.9 10*3/uL (ref 0.7–4.0)
Lymphocytes Relative: 19.4 % (ref 12.0–46.0)
MCHC: 33.9 g/dL (ref 30.0–36.0)
MCV: 92.8 fl (ref 78.0–100.0)
Monocytes Absolute: 0.4 10*3/uL (ref 0.1–1.0)
Monocytes Relative: 8.6 % (ref 3.0–12.0)
NEUTROS ABS: 3 10*3/uL (ref 1.4–7.7)
NEUTROS PCT: 69 % (ref 43.0–77.0)
PLATELETS: 221 10*3/uL (ref 150.0–400.0)
RBC: 4.51 Mil/uL (ref 4.22–5.81)
RDW: 13.6 % (ref 11.5–15.5)
WBC: 4.4 10*3/uL (ref 4.0–10.5)

## 2018-05-24 LAB — URINALYSIS, ROUTINE W REFLEX MICROSCOPIC
Bilirubin Urine: NEGATIVE
HGB URINE DIPSTICK: NEGATIVE
Ketones, ur: NEGATIVE
Leukocytes, UA: NEGATIVE
NITRITE: NEGATIVE
RBC / HPF: NONE SEEN (ref 0–?)
Specific Gravity, Urine: 1.01 (ref 1.000–1.030)
TOTAL PROTEIN, URINE-UPE24: NEGATIVE
Urobilinogen, UA: 0.2 (ref 0.0–1.0)
pH: 6.5 (ref 5.0–8.0)

## 2018-05-24 LAB — LIPID PANEL
Cholesterol: 178 mg/dL (ref 0–200)
HDL: 39.6 mg/dL (ref >39.00)
LDL CALC: 105 mg/dL — AB (ref 0–99)
NonHDL: 138.65
Total CHOL/HDL Ratio: 5
Triglycerides: 167 mg/dL — ABNORMAL HIGH (ref 0.0–149.0)
VLDL: 33.4 mg/dL (ref 0.0–40.0)

## 2018-05-24 LAB — COMPREHENSIVE METABOLIC PANEL
ALT: 16 U/L (ref 0–53)
AST: 15 U/L (ref 0–37)
Albumin: 4.2 g/dL (ref 3.5–5.2)
Alkaline Phosphatase: 62 U/L (ref 39–117)
BILIRUBIN TOTAL: 0.7 mg/dL (ref 0.2–1.2)
BUN: 35 mg/dL — ABNORMAL HIGH (ref 6–23)
CALCIUM: 9.6 mg/dL (ref 8.4–10.5)
CHLORIDE: 104 meq/L (ref 96–112)
CO2: 28 meq/L (ref 19–32)
CREATININE: 1.6 mg/dL — AB (ref 0.40–1.50)
GFR: 52.97 mL/min — ABNORMAL LOW (ref >60.00)
GLUCOSE: 136 mg/dL — AB (ref 70–99)
Potassium: 4.2 mEq/L (ref 3.5–5.1)
Sodium: 139 mEq/L (ref 135–145)
Total Protein: 7.5 g/dL (ref 6.0–8.3)

## 2018-05-24 LAB — HEMOGLOBIN A1C: Hgb A1c MFr Bld: 7.7 % — ABNORMAL HIGH (ref 4.6–6.5)

## 2018-05-24 LAB — MICROALBUMIN / CREATININE URINE RATIO
CREATININE, U: 105.2 mg/dL
MICROALB/CREAT RATIO: 1.7 mg/g (ref 0.0–30.0)
Microalb, Ur: 1.8 mg/dL (ref 0.0–1.9)

## 2018-05-24 LAB — TSH: TSH: 3.5 u[IU]/mL (ref 0.35–4.50)

## 2018-05-24 MED ORDER — EMPAGLIFLOZIN-METFORMIN HCL ER 5-1000 MG PO TB24: 1.0000 | ORAL_TABLET | Freq: Every day | ORAL | 1 refills | Status: DC

## 2018-05-24 MED ORDER — ATORVASTATIN CALCIUM 20 MG PO TABS: 20.0000 mg | ORAL_TABLET | Freq: Every day | ORAL | 1 refills | Status: DC

## 2018-05-24 MED ORDER — LEVOCETIRIZINE DIHYDROCHLORIDE 5 MG PO TABS: 5.0000 mg | ORAL_TABLET | Freq: Every evening | ORAL | 1 refills | Status: DC

## 2018-05-24 MED ORDER — ALPRAZOLAM 0.5 MG PO TABS: 0.5000 mg | ORAL_TABLET | Freq: Every day | ORAL | 3 refills | Status: DC | PRN

## 2018-05-24 MED FILL — ALPRAZolam 0.5 MG TABS: 0.5 | 30 days supply | Qty: 30 | Fill #0

## 2018-05-24 MED FILL — LEVOCETIRIZINE 5 MG TABLET: 5 | 90 days supply | Qty: 90 | Fill #0

## 2018-05-24 MED FILL — ATORVASTATIN CALCIUM 20 MG: 20 | 90 days supply | Qty: 90 | Fill #0

## 2018-05-24 MED FILL — SYNJARDY XR 5-1,000 MG TAB: 5-1000 | 90 days supply | Qty: 90 | Fill #0

## 2018-05-24 NOTE — Patient Instructions (Signed)

## 2018-05-24 NOTE — Progress Notes (Signed)
Subjective:  Patient ID: Dale Anderson, male    DOB: [birth year]  Age: 84 y.o. MRN: [id number]  CC: Hypertension; Hyperlipidemia; Diabetes; Annual Exam; and Allergic Rhinitis    HPI Josean Lycan Neeb presents for a CPX.  He complains of a several month history of runny nose and postnasal drip.  He thinks his blood pressure and blood sugars have been well controlled.  He denies any recent episodes of CP, DOE, palpitations, edema, or fatigue.  He complains of chronic, unchanged nonradiating low back pain with no paresthesias.   Past Medical History:  Diagnosis Date  . Adhesive capsulitis of right shoulder   . Anxiety   . Diabetes mellitus ([place])   . DJD (degenerative joint disease)   . GERD (gastroesophageal reflux disease)   . Hemorrhoid   . History of shingles   . Hyperlipidemia   . Hypertension   . Nodular prostate without urinary obstruction   . Recurrent bronchiectasis ([place])   . Tear of right rotator cuff    Past Surgical History:  Procedure Laterality Date  . COLONOSCOPY  1998  . HAND RECONSTRUCTION     right  . INGUINAL HERNIA REPAIR  2006   Right.  Dr. Bubba Camp  . ROTATOR CUFF REPAIR  1995   Left.  Dr. Wonda Olds  . SHOULDER ARTHROSCOPY WITH ROTATOR CUFF REPAIR AND SUBACROMIAL DECOMPRESSION  05/25/2012   Procedure: SHOULDER ARTHROSCOPY WITH ROTATOR CUFF REPAIR AND SUBACROMIAL DECOMPRESSION;  Surgeon: Lorn Junes, MD;  Location: [facility];  Service: Orthopedics;  Laterality: Right;  Right Shoulder Arthroscopy shoulder decompression subacromial partial acromioplasty with coracoaromial release, arthroscopy shoulder distal claviculectomy, arthroscopy shoulder with rotator cuff repai    reports that he has never smoked. He has never used smokeless tobacco. He reports that he does not drink alcohol or use drugs. family history includes Emphysema in his other; Heart disease in his father. Allergies  Allergen Reactions  . Lisinopril Cough    Outpatient  Medications Prior to Visit  Medication Sig Dispense Refill  . blood glucose meter kit and supplies KIT Use to test blood sugar once daily. DX: E11.9 1 each 0  . Blood Glucose Monitoring Suppl (FREESTYLE LITE) DEVI   0  . chlorthalidone (HYGROTON) 25 MG tablet TAKE 1 TABLET (25 MG TOTAL) BY MOUTH DAILY. 90 tablet 1  . Cholecalciferol (VITAMIN D) 1000 UNITS capsule Take 1,000 Units by mouth daily.      Marland Kitchen glucose blood (FREESTYLE LITE) test strip USE TO CHECK BLOOD SUGAR ONCE DAILY 100 each 1  . Lancets MISC Use to test blood sugar once daily. DX E11.09 100 each 3  . pantoprazole (PROTONIX) 40 MG tablet TAKE 1 TABLET BY MOUTH ONCE DAILY 90 tablet 1  . tamsulosin (FLOMAX) 0.4 MG CAPS capsule Take 1 capsule (0.4 mg total) by mouth daily. 90 capsule 1  . telmisartan (MICARDIS) 40 MG tablet TAKE 1 TABLET BY MOUTH DAILY. 90 tablet 1  . ALPRAZolam (XANAX) 0.5 MG tablet TAKE 1 TABLET BY MOUTH DAILY AS NEEDED FOR ANXIETY 30 tablet 3  . aspirin (ASPIRIN LOW DOSE) 81 MG EC tablet TAKE 1 TABLET BY MOUTH EVERY DAY 90 tablet 3  . atorvastatin (LIPITOR) 20 MG tablet Take 1 tablet (20 mg total) by mouth daily. 90 tablet 1  . diclofenac sodium (VOLTAREN) 1 % GEL Apply 4 g topically 4 (four) times daily. 100 g 0  . Empagliflozin-Metformin HCl ER (SYNJARDY XR) 10-998 MG TB24 Take 1 tablet by mouth daily. [place]  tablet 1  . ezetimibe (ZETIA) 10 MG tablet Take 10 mg by mouth daily.   3  . fluocinonide (LIDEX) 0.05 % external solution APPLY TO AFFECTED AREA AS DIRECTED 60 mL 2  . telmisartan (MICARDIS) 40 MG tablet Take 1 tablet (40 mg total) by mouth daily. 90 tablet 1   No facility-administered medications prior to visit.     ROS Review of Systems  Constitutional: Negative.  Negative for diaphoresis and fatigue.  HENT: Positive for congestion, postnasal drip and rhinorrhea. Negative for facial swelling, nosebleeds, sinus pressure, sore throat and trouble swallowing.   Eyes: Negative.  Negative for visual  disturbance.  Respiratory: Negative for cough, shortness of breath and wheezing.   Cardiovascular: Negative for chest pain, palpitations and leg swelling.  Gastrointestinal: Negative.  Negative for abdominal pain, constipation, diarrhea, nausea and vomiting.  Endocrine: Negative for polydipsia, polyphagia and polyuria.  Genitourinary: Negative.  Negative for difficulty urinating.  Musculoskeletal: Negative.  Negative for arthralgias and myalgias.  Skin: Negative.   Neurological: Negative.  Negative for dizziness and weakness.  Hematological: Negative for adenopathy. Does not bruise/bleed easily.  Psychiatric/Behavioral: Negative for behavioral problems, dysphoric mood, sleep disturbance and suicidal ideas. The patient is nervous/anxious.     Objective:  BP 138/78 (BP Location: Left Arm, Patient Position: Sitting, Cuff Size: Normal)   Pulse 68   Temp (!) 97.4 F (36.3 C) (Oral)   Resp 16   Ht '5\' 11"'  (1.803 m)   Wt 160 lb 4 oz (72.7 kg)   SpO2 98%   BMI 22.35 kg/m   BP Readings from Last 3 Encounters:  05/24/18 138/78  11/12/17 118/70  10/27/17 (!) 124/54    Wt Readings from Last 3 Encounters:  05/24/18 160 lb 4 oz (72.7 kg)  11/12/17 156 lb 4 oz (70.9 kg)  10/27/17 160 lb (72.6 kg)    Physical Exam  Constitutional: He is oriented to person, place, and time. No distress.  HENT:  Left Ear: External ear normal.  Nose: Mucosal edema and rhinorrhea present. No nasal deformity. No epistaxis. Right sinus exhibits no maxillary sinus tenderness and no frontal sinus tenderness. Left sinus exhibits no maxillary sinus tenderness and no frontal sinus tenderness.  Mouth/Throat: No oropharyngeal exudate.  Eyes: Conjunctivae are normal. No scleral icterus.  Neck: Normal range of motion. Neck supple. No JVD present. No thyromegaly present.  Cardiovascular: Normal rate, regular rhythm and normal heart sounds.  No murmur heard. Pulmonary/Chest: Effort normal and breath sounds normal. No  respiratory distress. He has no wheezes. He has no rales.  Abdominal: Soft. Bowel sounds are normal. He exhibits no mass. There is no hepatosplenomegaly. There is no tenderness.  Musculoskeletal: Normal range of motion. He exhibits no edema, tenderness or deformity.  Lymphadenopathy:    He has no cervical adenopathy.  Neurological: He is alert and oriented to person, place, and time.  Skin: Skin is warm and dry. No rash noted. He is not diaphoretic.  Psychiatric: He has a normal mood and affect. His behavior is normal. Judgment and thought content normal.  Vitals reviewed.   Lab Results  Component Value Date   WBC 4.4 05/24/2018   HGB 14.2 05/24/2018   HCT 41.8 05/24/2018   PLT 221.0 05/24/2018   GLUCOSE 136 (H) 05/24/2018   CHOL 178 05/24/2018   TRIG 167.0 (H) 05/24/2018   HDL 39.60 05/24/2018   LDLCALC 105 (H) 05/24/2018   ALT 16 05/24/2018   AST 15 05/24/2018   NA 139 05/24/2018  K 4.2 05/24/2018   CL 104 05/24/2018   CREATININE 1.60 (H) 05/24/2018   BUN 35 (H) 05/24/2018   CO2 28 05/24/2018   TSH 3.50 05/24/2018   PSA 2.55 08/21/2015   HGBA1C 7.7 (H) 05/24/2018   MICROALBUR 1.8 05/24/2018    Dg Chest 2 View  Result Date: 11/12/2017 CLINICAL DATA:  Cough and congestion EXAM: CHEST - 2 VIEW COMPARISON:  March 20, 2015 FINDINGS: Suspect nipple shadow on the left. No edema or consolidation evident. Heart size and pulmonary vascularity are normal. No adenopathy. There is degenerative change in the thoracic spine. IMPRESSION: Suspect nipple shadow on the left. Advise repeat study with nipple markers to confirm. No edema or consolidation. Heart size normal. No evident adenopathy. Electronically Signed   By: Lowella Grip III M.D.   On: 11/12/2017 11:33    Assessment & Plan:   Mataio was seen today for hypertension, hyperlipidemia, diabetes, annual exam and allergic rhinitis .  Diagnoses and all orders for this visit:  Essential hypertension- His blood pressure is  adequately well controlled.  Electrolytes are normal.  His renal function is stable. -     Comprehensive metabolic panel; Future -     CBC with Differential/Platelet; Future -     TSH; Future  Type II diabetes mellitus with manifestations ([place])-  His A1c is up to 7.7%.  His blood sugars are not adequately well controlled.  Based on his refills it looks like he has not been compliant with Synjardy so I have asked him to restart it. -     HM Diabetes Foot Exam -     Hemoglobin A1c; Future -     Microalbumin / creatinine urine ratio; Future -     Empagliflozin-metFORMIN HCl ER (SYNJARDY XR) 10-998 MG TB24; Take 1 tablet by mouth daily.  Chronic renal disease, stage 3, moderately decreased glomerular filtration rate (GFR) between 30-59 mL/min/1.73 square meter ([place])- His renal function is stable.  I have asked him to avoid nephrotoxic agents.  Will continue to maintain good control of his blood pressure and his blood sugars. -     Comprehensive metabolic panel; Future -     Urinalysis, Routine w reflex microscopic; Future  Hyperlipidemia with target LDL less than 100- He has not achieved his LDL goal.  I have asked him to restart the statin. -     Lipid panel; Future -     TSH; Future -     atorvastatin (LIPITOR) 20 MG tablet; Take 1 tablet (20 mg total) by mouth daily.  Routine general medical examination at a health care facility  Seasonal allergic rhinitis due to pollen -     levocetirizine (XYZAL) 5 MG tablet; Take 1 tablet (5 mg total) by mouth every evening.  Need for pneumococcal vaccination -     Pneumococcal polysaccharide vaccine 23-valent greater than or equal to 2yo subcutaneous/IM  Type 2 diabetes mellitus with complication, without long-term current use of insulin (HCC)  Anxiety -     ALPRAZolam (XANAX) 0.5 MG tablet; Take 1 tablet (0.5 mg total) by mouth daily as needed. for anxiety   I have discontinued [name]. Fricker's aspirin, ezetimibe, fluocinonide, and  diclofenac sodium. I have also changed his ALPRAZolam. Additionally, I am having him start on levocetirizine. Lastly, I am having him maintain his Vitamin D, blood glucose meter kit and supplies, Lancets, FREESTYLE LITE, glucose blood, tamsulosin, chlorthalidone, telmisartan, pantoprazole, Empagliflozin-metFORMIN HCl ER, and atorvastatin.  Meds ordered this encounter  Medications  .  levocetirizine (XYZAL) 5 MG tablet    Sig: Take 1 tablet (5 mg total) by mouth every evening.    Dispense:  90 tablet    Refill:  1  . Empagliflozin-metFORMIN HCl ER (SYNJARDY XR) 10-998 MG TB24    Sig: Take 1 tablet by mouth daily.    Dispense:  90 tablet    Refill:  1  . atorvastatin (LIPITOR) 20 MG tablet    Sig: Take 1 tablet (20 mg total) by mouth daily.    Dispense:  90 tablet    Refill:  1  . ALPRAZolam (XANAX) 0.5 MG tablet    Sig: Take 1 tablet (0.5 mg total) by mouth daily as needed. for anxiety    Dispense:  30 tablet    Refill:  3   See AVS for instructions about healthy living and anticipatory guidance.  Follow-up: Return in about 6 months (around 11/23/2018).  Scarlette Calico, MD

## 2018-05-25 NOTE — Assessment & Plan Note (Signed)

## 2018-05-26 MED FILL — SYNJARDY XR 5-1,000 MG TAB: 5-1000 | 90 days supply | Qty: 90 | Fill #0

## 2018-06-14 DIAGNOSIS — R351 Nocturia: Secondary | ICD-10-CM | POA: Diagnosis not present

## 2018-06-14 DIAGNOSIS — Z125 Encounter for screening for malignant neoplasm of prostate: Secondary | ICD-10-CM | POA: Diagnosis not present

## 2018-07-31 ENCOUNTER — Emergency Department (HOSPITAL_COMMUNITY)
Admission: EM | Admit: 2018-07-31 | Discharge: 2018-07-31 | Disposition: A | Discharge status: Home / Self Care | Payer: 59 | Attending: Emergency Medicine | Admitting: Emergency Medicine

## 2018-07-31 ENCOUNTER — Other Ambulatory Visit: Payer: Self-pay

## 2018-07-31 DIAGNOSIS — Z041 Encounter for examination and observation following transport accident: Secondary | ICD-10-CM | POA: Diagnosis not present
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
## 2018-07-31 NOTE — Discharge Instructions (Addendum)
You may develop some muscle aches or soreness over the neck several days.  If you do, you may take Tylenol or ibuprofen as needed for pain control. Return to the emergency room if you develop vision changes, slurred speech, headache, chest pain, nausea, vomiting, dizziness, loss of bowel bladder control, or any new, worsening, concerning symptoms.

## 2018-07-31 NOTE — ED Notes (Signed)
Patient given discharge instructions and verbalized understanding.  Patient stable to discharge at this time.  Patient is alert and oriented to baseline.  No distressed noted at this time.  All belongings taken with the patient at discharge.   

## 2018-08-04 ENCOUNTER — Encounter: Payer: Self-pay | Admitting: Nurse Practitioner

## 2018-08-04 ENCOUNTER — Ambulatory Visit (INDEPENDENT_AMBULATORY_CARE_PROVIDER_SITE_OTHER): Payer: 59 | Admitting: Nurse Practitioner

## 2018-08-04 VITALS — BP 130/70 | HR 80 | Temp 98.1°F | Ht 71.0 in | Wt 158.0 lb

## 2018-08-04 DIAGNOSIS — J40 Bronchitis, not specified as acute or chronic: Secondary | ICD-10-CM

## 2018-08-04 MED ORDER — DOXYCYCLINE HYCLATE 100 MG PO TABS: 100.0000 mg | ORAL_TABLET | Freq: Two times a day (BID) | ORAL | 0 refills | Status: DC

## 2018-08-04 MED FILL — DOXYCYCLINE HYCLATE 100 MG: 100 | 10 days supply | Qty: 20 | Fill #0

## 2018-08-04 MED FILL — CHLORTHALIDONE 25 MG TABS: 25 | 90 days supply | Qty: 90 | Fill #1

## 2018-08-04 MED FILL — SYNJARDY XR 5-1,000 MG TAB: 5-1000 | 90 days supply | Qty: 90 | Fill #1

## 2018-08-04 MED FILL — FREESTYLE LITE TEST STRIP: 90 days supply | Qty: 100 | Fill #1

## 2018-08-04 NOTE — Patient Instructions (Signed)
Complete doxycyline as prescribed  Please follow up for fevers over 101, if your symptoms get worse, or if your symptoms dont improve with the antibiotic.    Acute Bronchitis, Adult Acute bronchitis is when air tubes (bronchi) in the lungs suddenly get swollen. The condition can make it hard to breathe. It can also cause these symptoms:  A cough.  Coughing up clear, yellow, or green mucus.  Wheezing.  Chest congestion.  Shortness of breath.  A fever.  Body aches.  Chills.  A sore throat. Follow these instructions at home:  Medicines  Take over-the-counter and prescription medicines only as told by your doctor.  If you were prescribed an antibiotic medicine, take it as told by your doctor. Do not stop taking the antibiotic even if you start to feel better. General instructions  Rest.  Drink enough fluids to keep your pee (urine) pale yellow.  Avoid smoking and secondhand smoke. If you smoke and you need help quitting, ask your doctor. Quitting will help your lungs heal faster.  Use an inhaler, cool mist vaporizer, or humidifier as told by your doctor.  Keep all follow-up visits as told by your doctor. This is important. How is this prevented? To lower your risk of getting this condition again:  Wash your hands often with soap and water. If you cannot use soap and water, use hand sanitizer.  Avoid contact with people who have cold symptoms.  Try not to touch your hands to your mouth, nose, or eyes.  Make sure to get the flu shot every year. Contact a doctor if:  Your symptoms do not get better in 2 weeks. Get help right away if:  You cough up blood.  You have chest pain.  You have very bad shortness of breath.  You become dehydrated.  You faint (pass out) or keep feeling like you are going to pass out.  You keep throwing up (vomiting).  You have a very bad headache.  Your fever or chills gets worse. This information is not intended to replace  advice given to you by your health care provider. Make sure you discuss any questions you have with your health care provider. Document Released: 11/26/2007 Document Revised: 01/21/2017 Document Reviewed: 11/28/2015 Elsevier Interactive Patient Education  2019 [employer].

## 2018-08-04 NOTE — Progress Notes (Signed)
Dale Anderson is a 85 y.o. male with the following history as recorded in [employer]:  Patient Active Problem List   Diagnosis Date Noted  . Seasonal allergic rhinitis due to pollen 05/24/2018  . Sciatica 10/21/2017  . Chronic renal disease, stage 3, moderately decreased glomerular filtration rate (GFR) between 30-59 mL/min/1.73 square meter (HCC) 07/02/2017  . Routine general medical examination at a health care facility 12/26/2013  . DDD (degenerative disc disease), cervical 12/26/2013  . Hypertension   . Hyperlipidemia with target LDL less than 100   . Type II diabetes mellitus with manifestations ([place])   . GERD (gastroesophageal reflux disease)   . DJD (degenerative joint disease)   . Anxiety     Current Outpatient Medications  Medication Sig Dispense Refill  . ALPRAZolam (XANAX) 0.5 MG tablet Take 1 tablet (0.5 mg total) by mouth daily as needed. for anxiety 30 tablet 3  . atorvastatin (LIPITOR) 20 MG tablet Take 1 tablet (20 mg total) by mouth daily. 90 tablet 1  . blood glucose meter kit and supplies KIT Use to test blood sugar once daily. DX: E11.9 1 each 0  . Blood Glucose Monitoring Suppl (FREESTYLE LITE) DEVI   0  . chlorthalidone (HYGROTON) 25 MG tablet TAKE 1 TABLET (25 MG TOTAL) BY MOUTH DAILY. 90 tablet 1  . Cholecalciferol (VITAMIN D) 1000 UNITS capsule Take 1,000 Units by mouth daily.      . Empagliflozin-metFORMIN HCl ER (SYNJARDY XR) 10-998 MG TB24 Take 1 tablet by mouth daily. 90 tablet 1  . glucose blood (FREESTYLE LITE) test strip USE TO CHECK BLOOD SUGAR ONCE DAILY 100 each 1  . Lancets MISC Use to test blood sugar once daily. DX E11.09 100 each 3  . levocetirizine (XYZAL) 5 MG tablet Take 1 tablet (5 mg total) by mouth every evening. 90 tablet 1  . pantoprazole (PROTONIX) 40 MG tablet TAKE 1 TABLET BY MOUTH ONCE DAILY 90 tablet 1  . tamsulosin (FLOMAX) 0.4 MG CAPS capsule Take 1 capsule (0.4 mg total) by mouth daily. 90 capsule 1  . telmisartan (MICARDIS) 40  MG tablet TAKE 1 TABLET BY MOUTH DAILY. 90 tablet 1  . doxycycline (VIBRA-TABS) 100 MG tablet Take 1 tablet (100 mg total) by mouth 2 (two) times daily. 20 tablet 0   No current facility-administered medications for this visit.     Allergies: Lisinopril  Past Medical History:  Diagnosis Date  . Adhesive capsulitis of right shoulder   . Anxiety   . Diabetes mellitus ([place])   . DJD (degenerative joint disease)   . GERD (gastroesophageal reflux disease)   . Hemorrhoid   . History of shingles   . Hyperlipidemia   . Hypertension   . Nodular prostate without urinary obstruction   . Recurrent bronchiectasis ([place])   . Tear of right rotator cuff     Past Surgical History:  Procedure Laterality Date  . COLONOSCOPY  1998  . HAND RECONSTRUCTION     right  . INGUINAL HERNIA REPAIR  2006   Right.  Dr. Bubba Camp  . ROTATOR CUFF REPAIR  1995   Left.  Dr. Wonda Olds  . SHOULDER ARTHROSCOPY WITH ROTATOR CUFF REPAIR AND SUBACROMIAL DECOMPRESSION  05/25/2012   Procedure: SHOULDER ARTHROSCOPY WITH ROTATOR CUFF REPAIR AND SUBACROMIAL DECOMPRESSION;  Surgeon: Lorn Junes, MD;  Location: [facility];  Service: Orthopedics;  Laterality: Right;  Right Shoulder Arthroscopy shoulder decompression subacromial partial acromioplasty with coracoaromial release, arthroscopy shoulder distal claviculectomy, arthroscopy shoulder with rotator  cuff repai    Family History  Problem Relation Age of Onset  . Heart disease Father        MI at age 36  . Emphysema Other        cousin  . Colon cancer Neg Hx     Social History   Tobacco Use  . Smoking status: Never Smoker  . Smokeless tobacco: Never Used  Substance Use Topics  . Alcohol use: No     Subjective:  Dale Anderson is here today requesting evaluation of acute complaint of cough/cold symptoms, which first began about 1 week ago. He reports sinus and nasal pressure and congestion, copious nasal drainage, productive cough with yellow sputum. He  feels no better or worse since onset, feels like he "just cant get rid of the cough." His wife urged him to come today.  Denies: fevers, chills, syncope, confusion, weakness, body aches, headaches, ear pain/drainage, sore throat, cp, sob, abd pain, n/v/d, loss of appetite Smoker? 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  Head: Normocephalic and atraumatic  Eyes: Sclera and conjunctiva clear; pupils round and reactive to light; extraocular movements intact  Ears: External normal; canals clear; tympanic membranes sclerotic  Oropharynx: Mild posterior oropharyngeal erythema, no edema or exudate. No suspicious lesions  Neck: Supple without thyromegaly, adenopathy  Lungs: Respirations unlabored; clear to auscultation bilaterally; coarse cough CVS exam: normal rate and regular rhythm, S1 and S2 normal.  Extremities: No edema, cyanosis, clubbing  Vessels: Symmetric bilaterally  Neurologic: Alert and oriented; speech intact; face symmetrical; moves all extremities well; CNII-XII intact without focal deficit  Psychiatric: Normal mood and affect.  Assessment:  1. Bronchitis     Plan:   Due to duration, no improvement in symptoms, will treat with abx course for possible bronchitis/URI Doxycyline course sent-medication dosing, side effects discussed Home management, red flags and return precautions including when to seek immediate care discussed and printed on AVS-he will f/u for new, worsening symptoms or if no improvement with abx  No follow-ups on file.  No orders of the defined types were placed in this encounter.   Requested Prescriptions   Signed Prescriptions Disp Refills   . doxycycline (VIBRA-TABS) 100 MG tablet 20 tablet 0    Sig: Take 1 tablet (100 mg total) by mouth 2 (two) times daily.

## 2018-09-06 ENCOUNTER — Other Ambulatory Visit: Payer: Self-pay | Admitting: Internal Medicine

## 2018-09-06 DIAGNOSIS — R339 Retention of urine, unspecified: Secondary | ICD-10-CM

## 2018-09-06 DIAGNOSIS — I10 Essential (primary) hypertension: Secondary | ICD-10-CM

## 2018-09-06 MED FILL — TELMISARTAN 40 MG TABLET: 40 | 90 days supply | Qty: 90 | Fill #1

## 2018-09-06 MED FILL — TAMSULOSIN HCL 0.4 MG CAP: 0.4 | 90 days supply | Qty: 90 | Fill #0

## 2018-09-06 MED FILL — PANTOPRAZOLE SOD DR 40 MG T: 40 | 90 days supply | Qty: 90 | Fill #1

## 2018-09-06 MED FILL — ATORVASTATIN 20 MG TABLET: 20 | 90 days supply | Qty: 90 | Fill #1

## 2018-11-04 DIAGNOSIS — M1612 Unilateral primary osteoarthritis, left hip: Secondary | ICD-10-CM | POA: Diagnosis not present

## 2018-11-04 DIAGNOSIS — M545 Low back pain: Secondary | ICD-10-CM | POA: Diagnosis not present

## 2018-11-12 MED FILL — TIZANIDINE HCL 2 MG CAPS: 2 | 10 days supply | Qty: 30 | Fill #0

## 2018-11-18 DIAGNOSIS — M1612 Unilateral primary osteoarthritis, left hip: Secondary | ICD-10-CM | POA: Diagnosis not present

## 2018-11-25 ENCOUNTER — Encounter: Payer: Self-pay | Admitting: Neurology

## 2018-11-25 DIAGNOSIS — M545 Low back pain: Secondary | ICD-10-CM | POA: Diagnosis not present

## 2018-11-29 MED FILL — TIZANIDINE HCL 2 MG CAPS: 2 | 10 days supply | Qty: 30 | Fill #0

## 2018-12-07 DIAGNOSIS — M5416 Radiculopathy, lumbar region: Secondary | ICD-10-CM | POA: Diagnosis not present

## 2018-12-07 DIAGNOSIS — M545 Low back pain: Secondary | ICD-10-CM | POA: Diagnosis not present

## 2018-12-08 DIAGNOSIS — M5416 Radiculopathy, lumbar region: Secondary | ICD-10-CM | POA: Diagnosis not present

## 2018-12-08 DIAGNOSIS — M545 Low back pain: Secondary | ICD-10-CM | POA: Diagnosis not present

## 2018-12-10 DIAGNOSIS — H5203 Hypermetropia, bilateral: Secondary | ICD-10-CM | POA: Diagnosis not present

## 2018-12-10 DIAGNOSIS — H52223 Regular astigmatism, bilateral: Secondary | ICD-10-CM | POA: Diagnosis not present

## 2018-12-10 DIAGNOSIS — H524 Presbyopia: Secondary | ICD-10-CM | POA: Diagnosis not present

## 2018-12-10 LAB — HM DIABETES EYE EXAM

## 2018-12-15 ENCOUNTER — Other Ambulatory Visit: Payer: Self-pay | Admitting: Internal Medicine

## 2018-12-15 DIAGNOSIS — E785 Hyperlipidemia, unspecified: Secondary | ICD-10-CM

## 2018-12-15 DIAGNOSIS — E118 Type 2 diabetes mellitus with unspecified complications: Secondary | ICD-10-CM

## 2018-12-15 MED FILL — SYNJARDY XR 5-1,000 MG TAB: 5-1000 | 90 days supply | Qty: 90 | Fill #0

## 2018-12-15 MED FILL — PANTOPRAZOLE SOD DR 40 MG T: 40 | 90 days supply | Qty: 90 | Fill #0

## 2018-12-15 MED FILL — ATORVASTATIN 20 MG TABLET: 20 | 90 days supply | Qty: 90 | Fill #0

## 2018-12-22 ENCOUNTER — Other Ambulatory Visit (INDEPENDENT_AMBULATORY_CARE_PROVIDER_SITE_OTHER): Payer: 59

## 2018-12-22 ENCOUNTER — Encounter: Payer: Self-pay | Admitting: Internal Medicine

## 2018-12-22 ENCOUNTER — Other Ambulatory Visit: Payer: Self-pay

## 2018-12-22 ENCOUNTER — Ambulatory Visit (INDEPENDENT_AMBULATORY_CARE_PROVIDER_SITE_OTHER): Payer: 59 | Admitting: Internal Medicine

## 2018-12-22 VITALS — BP 140/84 | HR 60 | Temp 97.9°F | Resp 16 | Ht 71.0 in | Wt 162.8 lb

## 2018-12-22 DIAGNOSIS — E118 Type 2 diabetes mellitus with unspecified complications: Secondary | ICD-10-CM | POA: Diagnosis not present

## 2018-12-22 DIAGNOSIS — I10 Essential (primary) hypertension: Secondary | ICD-10-CM | POA: Diagnosis not present

## 2018-12-22 DIAGNOSIS — E785 Hyperlipidemia, unspecified: Secondary | ICD-10-CM

## 2018-12-22 DIAGNOSIS — R202 Paresthesia of skin: Secondary | ICD-10-CM | POA: Diagnosis not present

## 2018-12-22 DIAGNOSIS — N183 Chronic kidney disease, stage 3 unspecified: Secondary | ICD-10-CM

## 2018-12-22 DIAGNOSIS — R200 Anesthesia of skin: Secondary | ICD-10-CM | POA: Diagnosis not present

## 2018-12-22 LAB — COMPREHENSIVE METABOLIC PANEL
ALT: 16 U/L (ref 0–53)
AST: 15 U/L (ref 0–37)
Albumin: 4.3 g/dL (ref 3.5–5.2)
Alkaline Phosphatase: 65 U/L (ref 39–117)
BUN: 29 mg/dL — ABNORMAL HIGH (ref 6–23)
CO2: 26 mEq/L (ref 19–32)
Calcium: 9.3 mg/dL (ref 8.4–10.5)
Chloride: 106 mEq/L (ref 96–112)
Creatinine, Ser: 1.67 mg/dL — ABNORMAL HIGH (ref 0.40–1.50)
GFR: 47.37 mL/min — ABNORMAL LOW (ref >60.00)
Glucose, Bld: 111 mg/dL — ABNORMAL HIGH (ref 70–99)
Potassium: 4.3 mEq/L (ref 3.5–5.1)
Sodium: 140 mEq/L (ref 135–145)
Total Bilirubin: 1 mg/dL (ref 0.2–1.2)
Total Protein: 7.5 g/dL (ref 6.0–8.3)

## 2018-12-22 LAB — LIPID PANEL
Cholesterol: 136 mg/dL (ref 0–200)
HDL: 47.6 mg/dL (ref >39.00)
LDL Cholesterol: 72 mg/dL (ref 0–99)
NonHDL: 88.4
Total CHOL/HDL Ratio: 3
Triglycerides: 80 mg/dL (ref 0.0–149.0)
VLDL: 16 mg/dL (ref 0.0–40.0)

## 2018-12-22 LAB — CBC WITH DIFFERENTIAL/PLATELET
Basophils Absolute: 0 10*3/uL (ref 0.0–0.1)
Basophils Relative: 0.4 % (ref 0.0–3.0)
Eosinophils Absolute: 0.1 10*3/uL (ref 0.0–0.7)
Eosinophils Relative: 2.3 % (ref 0.0–5.0)
HCT: 40.6 % (ref 39.0–52.0)
Hemoglobin: 13.7 g/dL (ref 13.0–17.0)
Lymphocytes Relative: 19.5 % (ref 12.0–46.0)
Lymphs Abs: 1.2 10*3/uL (ref 0.7–4.0)
MCHC: 33.9 g/dL (ref 30.0–36.0)
MCV: 92.7 fl (ref 78.0–100.0)
Monocytes Absolute: 0.4 10*3/uL (ref 0.1–1.0)
Monocytes Relative: 6.9 % (ref 3.0–12.0)
Neutro Abs: 4.2 10*3/uL (ref 1.4–7.7)
Neutrophils Relative %: 70.9 % (ref 43.0–77.0)
Platelets: 193 10*3/uL (ref 150.0–400.0)
RBC: 4.38 Mil/uL (ref 4.22–5.81)
RDW: 13.7 % (ref 11.5–15.5)
WBC: 6 10*3/uL (ref 4.0–10.5)

## 2018-12-22 LAB — FOLATE: Folate: >23.9 ng/mL (ref >5.9)

## 2018-12-22 LAB — VITAMIN B12: Vitamin B-12: 525 pg/mL (ref 211–911)

## 2018-12-22 LAB — HEMOGLOBIN A1C: Hgb A1c MFr Bld: 8.1 % — ABNORMAL HIGH (ref 4.6–6.5)

## 2018-12-22 MED ORDER — TRESIBA FLEXTOUCH 200 UNIT/ML ~~LOC~~ SOPN: 20.0000 [IU] | PEN_INJECTOR | Freq: Every day | SUBCUTANEOUS | 1 refills | Status: DC

## 2018-12-22 NOTE — Progress Notes (Signed)
Subjective:  Patient ID: Dale Anderson, male    DOB: 1930/02/03  Age: 85 y.o. MRN: [id number]  CC: Back Pain, Diabetes, Hyperlipidemia, and Hypertension   HPI Dale Anderson presents for f/up - He complains of worsening numbness in both feet since I last saw him.  He has been seeing orthopedics recently for worsening low back pain.  He tells me he underwent a lumbar MRI 3 weeks ago.  I do not have a copy of those MRI results.  He tells me his orthopedist has referred him to a pain specialist to consider epidural steroid injections.  He does not monitor his blood pressure or his blood sugar.  Based on his symptoms he thinks they are both relatively well controlled.  Outpatient Medications Prior to Visit  Medication Sig Dispense Refill  . ALPRAZolam (XANAX) 0.5 MG tablet Take 1 tablet (0.5 mg total) by mouth daily as needed. for anxiety 30 tablet 3  . atorvastatin (LIPITOR) 20 MG tablet TAKE 1 TABLET BY MOUTH DAILY. 90 tablet 1  . blood glucose meter kit and supplies KIT Use to test blood sugar once daily. DX: E11.9 1 each 0  . Blood Glucose Monitoring Suppl (FREESTYLE LITE) DEVI   0  . chlorthalidone (HYGROTON) 25 MG tablet TAKE 1 TABLET (25 MG TOTAL) BY MOUTH DAILY. 90 tablet 0  . Cholecalciferol (VITAMIN D) 1000 UNITS capsule Take 1,000 Units by mouth daily.      Marland Kitchen glucose blood (FREESTYLE LITE) test strip USE TO CHECK BLOOD SUGAR ONCE DAILY 100 each 1  . Lancets MISC Use to test blood sugar once daily. DX E11.09 100 each 3  . levocetirizine (XYZAL) 5 MG tablet Take 1 tablet (5 mg total) by mouth every evening. 90 tablet 1  . pantoprazole (PROTONIX) 40 MG tablet TAKE 1 TABLET BY MOUTH ONCE DAILY 90 tablet 1  . tamsulosin (FLOMAX) 0.4 MG CAPS capsule TAKE 1 CAPSULE BY MOUTH DAILY. 90 capsule 1  . telmisartan (MICARDIS) 40 MG tablet TAKE 1 TABLET BY MOUTH DAILY. 90 tablet 1  . doxycycline (VIBRA-TABS) 100 MG tablet Take 1 tablet (100 mg total) by mouth 2 (two) times daily. 20 tablet  0  . SYNJARDY XR 10-998 MG TB24 TAKE 1 TABLET BY MOUTH DAILY. 90 tablet 0   No facility-administered medications prior to visit.     ROS Review of Systems  Constitutional: Negative for diaphoresis, fatigue and unexpected weight change.  HENT: Negative.   Eyes: Negative for visual disturbance.  Respiratory: Negative for cough, chest tightness, shortness of breath and wheezing.   Cardiovascular: Negative for chest pain, palpitations and leg swelling.  Gastrointestinal: Negative for abdominal pain, constipation, diarrhea and nausea.  Endocrine: Negative.  Negative for polydipsia, polyphagia and polyuria.  Genitourinary: Negative.  Negative for difficulty urinating and dysuria.  Musculoskeletal: Positive for back pain. Negative for myalgias and neck pain.  Skin: Negative.  Negative for color change and pallor.  Neurological: Positive for numbness. Negative for dizziness, weakness and light-headedness.  Hematological: Negative for adenopathy. Does not bruise/bleed easily.  Psychiatric/Behavioral: Negative.     Objective:  BP 140/84 (BP Location: Left Arm, Patient Position: Sitting, Cuff Size: Normal)   Pulse 60   Temp 97.9 F (36.6 C) (Oral)   Resp 16   Ht '5\' 11"'  (1.803 m)   Wt 162 lb 12 oz (73.8 kg)   SpO2 97%   BMI 22.70 kg/m   BP Readings from Last 3 Encounters:  12/22/18 140/84  08/04/18 130/70  07/31/18 (!) 132/95    Wt Readings from Last 3 Encounters:  12/22/18 162 lb 12 oz (73.8 kg)  08/04/18 158 lb (71.7 kg)  07/31/18 162 lb (73.5 kg)    Physical Exam Vitals signs reviewed.  Constitutional:      Appearance: He is not ill-appearing or diaphoretic.  HENT:     Nose: Nose normal.     Mouth/Throat:     Mouth: Mucous membranes are moist.  Eyes:     General: No scleral icterus.    Conjunctiva/sclera: Conjunctivae normal.  Neck:     Musculoskeletal: Normal range of motion and neck supple. No neck rigidity or muscular tenderness.  Cardiovascular:     Rate and  Rhythm: Normal rate and regular rhythm.     Heart sounds: S1 normal and S2 normal. Murmur present. Systolic murmur present. No diastolic murmur. No gallop.   Pulmonary:     Effort: Pulmonary effort is normal.     Breath sounds: No stridor. No wheezing, rhonchi or rales.  Abdominal:     General: Abdomen is flat. Bowel sounds are normal. There is no distension.     Palpations: There is no hepatomegaly or splenomegaly.     Tenderness: There is no abdominal tenderness.  Musculoskeletal: Normal range of motion.     Right lower leg: No edema.     Left lower leg: No edema.  Lymphadenopathy:     Cervical: No cervical adenopathy.  Skin:    General: Skin is warm and dry.     Coloration: Skin is not jaundiced or pale.  Neurological:     General: No focal deficit present.     Mental Status: He is oriented to person, place, and time. Mental status is at baseline.     Cranial Nerves: Cranial nerves are intact.     Sensory: Sensory deficit present.     Motor: Motor function is intact. No weakness, tremor or seizure activity.     Coordination: Coordination is intact. Coordination normal.     Deep Tendon Reflexes:     Reflex Scores:      Bicep reflexes are 1+ on the right side and 1+ on the left side.      Brachioradialis reflexes are 1+ on the right side and 1+ on the left side.      Patellar reflexes are 1+ on the right side and 1+ on the left side.      Achilles reflexes are 0 on the right side and 0 on the left side. Psychiatric:        Mood and Affect: Mood normal.        Behavior: Behavior normal.     Lab Results  Component Value Date   WBC 6.0 12/22/2018   HGB 13.7 12/22/2018   HCT 40.6 12/22/2018   PLT 193.0 12/22/2018   GLUCOSE 111 (H) 12/22/2018   CHOL 136 12/22/2018   TRIG 80.0 12/22/2018   HDL 47.60 12/22/2018   LDLCALC 72 12/22/2018   ALT 16 12/22/2018   AST 15 12/22/2018   NA 140 12/22/2018   K 4.3 12/22/2018   CL 106 12/22/2018   CREATININE 1.67 (H) 12/22/2018   BUN  29 (H) 12/22/2018   CO2 26 12/22/2018   TSH 3.50 05/24/2018   PSA 2.55 08/21/2015   HGBA1C 8.1 (H) 12/22/2018   MICROALBUR 1.8 05/24/2018    No results found.  Assessment & Plan:   Dale Anderson was seen today for back pain, diabetes, hyperlipidemia and hypertension.  Diagnoses and  all orders for this visit:  Numbness and tingling of both feet- He has a sensory neuropathy that is painless.  I have asked him to undergo a nerve conduction study to try to identify the cause but I think this will ultimately be diabetic neuropathy.  His labs are negative for any secondary metabolic causes. -     CBC with Differential/Platelet; Future -     Folate; Future -     Vitamin B12; Future -     Nerve conduction test; Future  Essential hypertension- His blood pressure is adequately well controlled.  His renal function has declined. -     CBC with Differential/Platelet; Future -     Comprehensive metabolic panel; Future  Type II diabetes mellitus with manifestations ([place])- His creatinine clearance is down to near 30.  I have therefore asked him to stop taking metformin and the SGLT2 inhibitor.  I recommended that he control his blood sugar with a basal insulin. -     Hemoglobin A1c; Future -     Amb Referral to Nutrition and Diabetic E -     Ambulatory referral to Endocrinology -     Insulin Degludec (TRESIBA FLEXTOUCH) 200 UNIT/ML SOPN; Inject 20 Units into the skin daily.  Chronic renal disease, stage 3, moderately decreased glomerular filtration rate (GFR) between 30-59 mL/min/1.73 square meter ([place])- I have asked him to avoid nephrotoxic agents.  I recommended that he see nephrology for ongoing evaluation and treatment options. -     Comprehensive metabolic panel; Future -     Ambulatory referral to Nephrology  Hyperlipidemia with target LDL less than 100- He has achieved his LDL goal and is doing well on the statin. -     Lipid panel; Future -     Comprehensive metabolic panel; Future   I  have discontinued [name]. Lamm's doxycycline and Synjardy XR. I am also having him start on Tresiba FlexTouch. Additionally, I am having him maintain his Vitamin D, blood glucose meter kit and supplies, Lancets, FreeStyle Lite, glucose blood, telmisartan, levocetirizine, ALPRAZolam, tamsulosin, chlorthalidone, pantoprazole, and atorvastatin.  Meds ordered this encounter  Medications  . Insulin Degludec (TRESIBA FLEXTOUCH) 200 UNIT/ML SOPN    Sig: Inject 20 Units into the skin daily.    Dispense:  6 mL    Refill:  1     Follow-up: Return in about 3 months (around 03/24/2019).  Scarlette Calico, MD

## 2018-12-22 NOTE — Patient Instructions (Signed)

## 2018-12-23 DIAGNOSIS — M545 Low back pain: Secondary | ICD-10-CM | POA: Diagnosis not present

## 2018-12-23 DIAGNOSIS — M5416 Radiculopathy, lumbar region: Secondary | ICD-10-CM | POA: Diagnosis not present

## 2018-12-23 MED FILL — TRESIBA FLEXTOUCH 200 UNITS: 200 | 60 days supply | Qty: 6 | Fill #0

## 2018-12-29 DIAGNOSIS — M5416 Radiculopathy, lumbar region: Secondary | ICD-10-CM | POA: Diagnosis not present

## 2018-12-29 DIAGNOSIS — M545 Low back pain: Secondary | ICD-10-CM | POA: Diagnosis not present

## 2019-01-03 ENCOUNTER — Telehealth: Payer: Self-pay

## 2019-01-03 NOTE — Telephone Encounter (Signed)
Pt came in today to learn how to inject insulin.   Pt stated that he has not had his medication filled (rx was sent in on 12/22/2018).

## 2019-01-04 ENCOUNTER — Other Ambulatory Visit: Payer: Self-pay | Admitting: Internal Medicine

## 2019-01-04 DIAGNOSIS — R200 Anesthesia of skin: Secondary | ICD-10-CM

## 2019-01-04 DIAGNOSIS — R202 Paresthesia of skin: Secondary | ICD-10-CM

## 2019-01-04 MED FILL — TRESIBA FLEXTOUCH 200 UNITS: 200 | 60 days supply | Qty: 6 | Fill #0

## 2019-01-04 NOTE — Telephone Encounter (Signed)
Key: [id number]  PA stated that medication was available without PA.   Called pharmacy and they stated that the medication was ready but they put the rx back because is sat for 8 days.   Called pt and informed that rx was ready at the pharmacy.

## 2019-01-07 NOTE — Telephone Encounter (Signed)
[facility] calling about status of PA  Please call back

## 2019-01-10 NOTE — Telephone Encounter (Signed)
Called Cover My Med[employer] and informed that Dale Anderson was covered.

## 2019-01-13 ENCOUNTER — Telehealth: Payer: Self-pay | Admitting: Internal Medicine

## 2019-01-13 ENCOUNTER — Other Ambulatory Visit: Payer: Self-pay | Admitting: Internal Medicine

## 2019-01-13 DIAGNOSIS — E118 Type 2 diabetes mellitus with unspecified complications: Secondary | ICD-10-CM

## 2019-01-13 MED ORDER — NOVOFINE 32G X 6 MM MISC: 1.0000 | Freq: Every day | 1 refills | Status: DC

## 2019-01-13 NOTE — Telephone Encounter (Signed)
Haynes Dage calling from [facility] is calling regarding    Insulin Pen Needle (NOVOFINE) 32G X 6 MM MISC [[id number]]    Requesting this to be changed because it is very expensive for the patient.  Suggesting Unifine Pen Tips 31GX6MM  Please advise CB- [phone]

## 2019-01-14 MED ORDER — INSULIN PEN NEEDLE 31G X 6 MM MISC: 1.0000 | Freq: Every day | 1 refills | Status: DC

## 2019-01-14 MED FILL — UNIFINE PENTIPS 6MM 31G: 31G X 6 MM | 90 days supply | Qty: 100 | Fill #0

## 2019-01-14 NOTE — Telephone Encounter (Signed)
Updated Rx sent

## 2019-01-18 ENCOUNTER — Encounter: Payer: Self-pay | Admitting: Neurology

## 2019-01-27 ENCOUNTER — Ambulatory Visit (INDEPENDENT_AMBULATORY_CARE_PROVIDER_SITE_OTHER): Payer: 59 | Admitting: Internal Medicine

## 2019-01-27 ENCOUNTER — Other Ambulatory Visit: Payer: Self-pay

## 2019-01-27 ENCOUNTER — Encounter: Payer: Self-pay | Admitting: Internal Medicine

## 2019-01-27 VITALS — BP 148/70 | HR 61 | Temp 98.1°F | Resp 16 | Ht 71.0 in | Wt 160.5 lb

## 2019-01-27 DIAGNOSIS — E118 Type 2 diabetes mellitus with unspecified complications: Secondary | ICD-10-CM

## 2019-01-27 MED ORDER — INSULIN ASPART 100 UNIT/ML FLEXPEN: 5.0000 [IU] | PEN_INJECTOR | Freq: Three times a day (TID) | SUBCUTANEOUS | 5 refills | Status: DC

## 2019-01-27 NOTE — Patient Instructions (Signed)
Type 2 Diabetes Mellitus, Diagnosis, Adult Type 2 diabetes (type 2 diabetes mellitus) is a long-term (chronic) disease. In type 2 diabetes, one or both of these problems may be present:  The pancreas does not make enough of a hormone called insulin.  Cells in the body do not respond properly to insulin that the body makes (insulin resistance). Normally, insulin allows blood sugar (glucose) to enter cells in the body. The cells use glucose for energy. Insulin resistance or lack of insulin causes excess glucose to build up in the blood instead of going into cells. As a result, high blood glucose (hyperglycemia) develops. What increases the risk? The following factors may make you more likely to develop type 2 diabetes:  Having a family member with type 2 diabetes.  Being overweight or obese.  Having an inactive (sedentary) lifestyle.  Having been diagnosed with insulin resistance.  Having a history of prediabetes, gestational diabetes, or polycystic ovary syndrome (PCOS).  Being of American-Indian, African-American, Hispanic/Latino, or Asian/Pacific Islander descent. What are the signs or symptoms? In the early stage of this condition, you may not have symptoms. Symptoms develop slowly and may include:  Increased thirst (polydipsia).  Increased hunger(polyphagia).  Increased urination (polyuria).  Increased urination during the night (nocturia).  Unexplained weight loss.  Frequent infections that keep coming back (recurring).  Fatigue.  Weakness.  Vision changes, such as blurry vision.  Cuts or bruises that are slow to heal.  Tingling or numbness in the hands or feet.  Dark patches on the skin (acanthosis nigricans). How is this diagnosed? This condition is diagnosed based on your symptoms, your medical history, a physical exam, and your blood glucose level. Your blood glucose may be checked with one or more of the following blood tests:  A fasting blood glucose (FBG)  test. You will not be allowed to eat (you will fast) for 8 hours or longer before a blood sample is taken.  A random blood glucose test. This test checks blood glucose at any time of day regardless of when you ate.  An A1c (hemoglobin A1c) blood test. This test provides information about blood glucose control over the previous 2-3 months.  An oral glucose tolerance test (OGTT). This test measures your blood glucose at two times: ? After fasting. This is your baseline blood glucose level. ? Two hours after drinking a beverage that contains glucose. You may be diagnosed with type 2 diabetes if:  Your FBG level is 126 mg/dL (7.0 mmol/L) or higher.  Your random blood glucose level is 200 mg/dL (11.1 mmol/L) or higher.  Your A1c level is 6.5% or higher.  Your OGTT result is higher than 200 mg/dL (11.1 mmol/L). These blood tests may be repeated to confirm your diagnosis. How is this treated? Your treatment may be managed by a specialist called an endocrinologist. Type 2 diabetes may be treated by following instructions from your health care provider about:  Making diet and lifestyle changes. This may include: ? Following an individualized nutrition plan that is developed by a diet and nutrition specialist (registered dietitian). ? Exercising regularly. ? Finding ways to manage stress.  Checking your blood glucose level as often as told.  Taking diabetes medicines or insulin daily. This helps to keep your blood glucose levels in the healthy range. ? If you use insulin, you may need to adjust the dosage depending on how physically active you are and what foods you eat. Your health care provider will tell you how to adjust your dosage.    Taking medicines to help prevent complications from diabetes, such as: ? Aspirin. ? Medicine to lower cholesterol. ? Medicine to control blood pressure. Your health care provider will set individualized treatment goals for you. Your goals will be based on  your age, other medical conditions you have, and how you respond to diabetes treatment. Generally, the goal of treatment is to maintain the following blood glucose levels:  Before meals (preprandial): 80-130 mg/dL (4.4-7.2 mmol/L).  After meals (postprandial): below 180 mg/dL (10 mmol/L).  A1c level: less than 7%. Follow these instructions at home: Questions to ask your health care provider  Consider asking the following questions: ? Do I need to meet with a diabetes educator? ? Where can I find a support group for people with diabetes? ? What equipment will I need to manage my diabetes at home? ? What diabetes medicines do I need, and when should I take them? ? How often do I need to check my blood glucose? ? What number can I call if I have questions? ? When is my next appointment? General instructions  Take over-the-counter and prescription medicines only as told by your health care provider.  Keep all follow-up visits as told by your health care provider. This is important.  For more information about diabetes, visit: ? American Diabetes Association (ADA): www.diabetes.org ? American Association of Diabetes Educators (AADE): www.diabeteseducator.org Contact a health care provider if:  Your blood glucose is at or above 240 mg/dL (13.3 mmol/L) for 2 days in a row.  You have been sick or have had a fever for 2 days or longer, and you are not getting better.  You have any of the following problems for more than 6 hours: ? You cannot eat or drink. ? You have nausea and vomiting. ? You have diarrhea. Get help right away if:  Your blood glucose is lower than 54 mg/dL (3.0 mmol/L).  You become confused or you have trouble thinking clearly.  You have difficulty breathing.  You have moderate or large ketone levels in your urine. Summary  Type 2 diabetes (type 2 diabetes mellitus) is a long-term (chronic) disease. In type 2 diabetes, the pancreas does not make enough of a  hormone called insulin, or cells in the body do not respond properly to insulin that the body makes (insulin resistance).  This condition is treated by making diet and lifestyle changes and taking diabetes medicines or insulin.  Your health care provider will set individualized treatment goals for you. Your goals will be based on your age, other medical conditions you have, and how you respond to diabetes treatment.  Keep all follow-up visits as told by your health care provider. This is important. This information is not intended to replace advice given to you by your health care provider. Make sure you discuss any questions you have with your health care provider. Document Released: 06/09/2005 Document Revised: 08/07/2017 Document Reviewed: 07/13/2015 Elsevier Patient Education  2020 [employer]  

## 2019-01-27 NOTE — Progress Notes (Signed)
Subjective:  Patient ID: Dale Anderson, male    DOB: 25-Mar-1930  Age: 85 y.o. MRN: [id number]  CC: Hypertension and Diabetes   HPI Dale Anderson presents for f/up - He awakened during the night 4 days ago complaining of dizziness and lightheadedness.  His daughter who is an Therapist, sports came over and checked his blood pressure and says it was normal.  She checked his blood sugar and it was about 300.  He admits that a few hours earlier he had eaten pie and potato salad.  He said over the ensuing hours his blood sugar came back down to around 200 and he has felt well since then.  He tells me he is compliant with the basal insulin.  Outpatient Medications Prior to Visit  Medication Sig Dispense Refill  . ALPRAZolam (XANAX) 0.5 MG tablet Take 1 tablet (0.5 mg total) by mouth daily as needed. for anxiety 30 tablet 3  . atorvastatin (LIPITOR) 20 MG tablet TAKE 1 TABLET BY MOUTH DAILY. 90 tablet 1  . blood glucose meter kit and supplies KIT Use to test blood sugar once daily. DX: E11.9 1 each 0  . Blood Glucose Monitoring Suppl (FREESTYLE LITE) DEVI   0  . chlorthalidone (HYGROTON) 25 MG tablet TAKE 1 TABLET (25 MG TOTAL) BY MOUTH DAILY. 90 tablet 0  . Cholecalciferol (VITAMIN D) 1000 UNITS capsule Take 1,000 Units by mouth daily.      Marland Kitchen glucose blood (FREESTYLE LITE) test strip USE TO CHECK BLOOD SUGAR ONCE DAILY 100 each 1  . Insulin Degludec (TRESIBA FLEXTOUCH) 200 UNIT/ML SOPN Inject 20 Units into the skin daily. 6 mL 1  . Insulin Pen Needle 31G X 6 MM MISC 1 each by Does not apply route daily. Dx: E11.8 100 each 1  . Lancets MISC Use to test blood sugar once daily. DX E11.09 100 each 3  . levocetirizine (XYZAL) 5 MG tablet Take 1 tablet (5 mg total) by mouth every evening. 90 tablet 1  . pantoprazole (PROTONIX) 40 MG tablet TAKE 1 TABLET BY MOUTH ONCE DAILY 90 tablet 1  . tamsulosin (FLOMAX) 0.4 MG CAPS capsule TAKE 1 CAPSULE BY MOUTH DAILY. 90 capsule 1  . telmisartan (MICARDIS) 40 MG  tablet TAKE 1 TABLET BY MOUTH DAILY. 90 tablet 1   No facility-administered medications prior to visit.     ROS Review of Systems  Constitutional: Negative for diaphoresis, fatigue and unexpected weight change.  HENT: Negative.   Eyes: Negative.   Respiratory: Negative for cough, chest tightness, shortness of breath and wheezing.   Cardiovascular: Negative for chest pain, palpitations and leg swelling.  Gastrointestinal: Negative for abdominal pain, constipation, diarrhea and vomiting.  Endocrine: Negative.  Negative for polydipsia, polyphagia and polyuria.  Genitourinary: Negative.  Negative for difficulty urinating.  Musculoskeletal: Negative for arthralgias and myalgias.  Skin: Negative.   Neurological: Negative.  Negative for dizziness, weakness and light-headedness.  Hematological: Negative for adenopathy. Does not bruise/bleed easily.  Psychiatric/Behavioral: Negative.     Objective:  BP (!) 148/70 (BP Location: Left Arm, Patient Position: Sitting, Cuff Size: Normal)   Pulse 61   Temp 98.1 F (36.7 C) (Oral)   Resp 16   Ht _0  (1.803 m)   Wt 160 lb 8 oz (72.8 kg)   SpO2 93%   BMI 22.39 kg/m   BP Readings from Last 3 Encounters:  01/27/19 (!) 148/70  12/22/18 140/84  08/04/18 130/70    Wt Readings from Last 3 Encounters:  01/27/19 160 lb 8 oz (72.8 kg)  12/22/18 162 lb 12 oz (73.8 kg)  08/04/18 158 lb (71.7 kg)    Physical Exam Vitals signs reviewed.  Constitutional:      General: He is not in acute distress.    Appearance: He is not ill-appearing, toxic-appearing or diaphoretic.  HENT:     Nose: Nose normal.     Mouth/Throat:     Mouth: Mucous membranes are moist.  Eyes:     Conjunctiva/sclera: Conjunctivae normal.  Neck:     Musculoskeletal: Normal range of motion. No neck rigidity or muscular tenderness.  Cardiovascular:     Rate and Rhythm: Normal rate and regular rhythm.     Heart sounds: No murmur.  Pulmonary:     Effort: Pulmonary effort  is normal.     Breath sounds: No stridor. No wheezing, rhonchi or rales.  Abdominal:     General: Abdomen is flat.     Palpations: There is no hepatomegaly or splenomegaly.     Tenderness: There is no abdominal tenderness.  Musculoskeletal: Normal range of motion.     Right lower leg: No edema.     Left lower leg: No edema.  Lymphadenopathy:     Cervical: No cervical adenopathy.  Skin:    General: Skin is warm and dry.     Coloration: Skin is not pale.  Neurological:     General: No focal deficit present.     Mental Status: He is alert.     Lab Results  Component Value Date   WBC 6.0 12/22/2018   HGB 13.7 12/22/2018   HCT 40.6 12/22/2018   PLT 193.0 12/22/2018   GLUCOSE 111 (H) 12/22/2018   CHOL 136 12/22/2018   TRIG 80.0 12/22/2018   HDL 47.60 12/22/2018   LDLCALC 72 12/22/2018   ALT 16 12/22/2018   AST 15 12/22/2018   NA 140 12/22/2018   K 4.3 12/22/2018   CL 106 12/22/2018   CREATININE 1.67 (H) 12/22/2018   BUN 29 (H) 12/22/2018   CO2 26 12/22/2018   TSH 3.50 05/24/2018   PSA 2.55 08/21/2015   HGBA1C 8.1 (H) 12/22/2018   MICROALBUR 1.8 05/24/2018    No results found.  Assessment & Plan:   Dale Anderson was seen today for hypertension and diabetes.  Diagnoses and all orders for this visit:  Type II diabetes mellitus with manifestations ([place])- In addition to the basal insulin I have asked him to add a short acting insulin for large meals and as needed for blood sugar excursions above 200.  He also agrees to avoid eating foods that are high in fat and carbohydrates.  Other orders -     insulin aspart (NOVOLOG) 100 UNIT/ML FlexPen; Inject 5 Units into the skin 3 (three) times daily with meals.   I am having Dale Aloe. Anderson maintain his Vitamin D, blood glucose meter kit and supplies, Lancets, FreeStyle Lite, glucose blood, telmisartan, levocetirizine, ALPRAZolam, tamsulosin, chlorthalidone, pantoprazole, atorvastatin, Tresiba FlexTouch, Insulin Pen Needle, and  insulin aspart.  Meds ordered this encounter  Medications  . DISCONTD: insulin aspart (NOVOLOG) 100 UNIT/ML FlexPen    Sig: Inject 5 Units into the skin 3 (three) times daily with meals.    Dispense:  15 mL    Refill:  5  . insulin aspart (NOVOLOG) 100 UNIT/ML FlexPen    Sig: Inject 5 Units into the skin 3 (three) times daily with meals.    Dispense:  15 mL    Refill:  5  Follow-up: Return in about 3 months (around 04/29/2019).  Scarlette Calico, MD

## 2019-01-28 ENCOUNTER — Encounter: Payer: Self-pay | Admitting: Internal Medicine

## 2019-02-07 ENCOUNTER — Other Ambulatory Visit: Payer: Self-pay | Admitting: Internal Medicine

## 2019-02-07 DIAGNOSIS — I10 Essential (primary) hypertension: Secondary | ICD-10-CM

## 2019-02-07 DIAGNOSIS — E118 Type 2 diabetes mellitus with unspecified complications: Secondary | ICD-10-CM

## 2019-02-07 MED FILL — TELMISARTAN 40 MG TABLET: 40 | 90 days supply | Qty: 90 | Fill #0

## 2019-02-08 ENCOUNTER — Other Ambulatory Visit: Payer: Self-pay | Admitting: Internal Medicine

## 2019-02-08 MED FILL — FREESTYLE LITE TEST STRIP: 90 days supply | Qty: 100 | Fill #0

## 2019-02-10 ENCOUNTER — Other Ambulatory Visit: Payer: Self-pay

## 2019-02-10 ENCOUNTER — Encounter: Payer: Self-pay | Admitting: Registered"

## 2019-02-10 ENCOUNTER — Encounter: Payer: 59 | Attending: Internal Medicine | Admitting: Registered"

## 2019-02-10 DIAGNOSIS — E118 Type 2 diabetes mellitus with unspecified complications: Secondary | ICD-10-CM | POA: Insufficient documentation

## 2019-02-10 NOTE — Patient Instructions (Addendum)
Instructions/Goals:   Eat 3 meals per day/avoid going more than 4-5 hours without eating. If meals are spaced more than 5 hours apart include a balanced snack (see handout)   At meals, include a consistent amount of carbohydrate. Recommend starting with 2-3 carbohydrate choices/servings which equals 30-45 grams of carbohydrates and if this is not enough food or less than you usually eat can include 3-4 carbohydrate choices/servings per meal which equals 45-60 grams of carbohydrates per meal. Main goal is to include a consistent amount at each mealtime.    Recommend checking blood sugar first thing in the morning and 1-2 hours after a meal at least 1 time daily for a total of checking at least 2 times per day. If you start taking fast acting insulin during the day you will need to check more often, would be good to then check before meals as well. Want to write down blood sugar numbers, time you checked and last time you ate and bring to appointments. Also want to carry glucose monitor with you in case you need to check while out or at work.    Call doctor's office to check about which medications you have been prescribed to ensure you aren't missing any medications.    Continue with physical activity routine. Good job staying active!

## 2019-02-10 NOTE — Progress Notes (Signed)
Diabetes Self-Management Education  Visit Type: First/Initial  Appt. Start Time: 0805 Appt. End Time: 3295  02/10/2019    [name], identified by name and date of birth, is a 85 y.o. male with a diagnosis of Diabetes: Type 2.   ASSESSMENT   Weight 158 lb 1.6 oz (71.7 kg). Body mass index is 22.05 kg/m.   Pt present for appointment alone. Pt works as Presenter, broadcasting for [employer]. Pt was unsure about one of the heart medications listed in the system and on last MD visit note. Advised pt to call his MD to ensure he is not missing any medications. Pt reports he is taking his basal insulin. Pt has not started the short acting insulin yet because he is waiting to see if insurance will cover it. Pt reports he has never had any issues from his diabetes until last month he felt dizzy and when a relative checked his blood sugar it was ~300. Reports this was after eating a carbohydrate heavy meal and dessert. Pt reports more recently he has been eating out more and including some soda which he was not doing before. Pt feels these changes in his diet led to his high blood sugar. Reports he often does not eat consistently. Pt feels this is one of his main problems. Pt reports he is very active around the house and also sometimes uses the exercise equipment at work.   Diabetes Self-Management Education - 02/10/19 1884      Visit Information   Visit Type  First/Initial      Initial Visit   Diabetes Type  Type 2    Are you currently following a meal plan?  No    Are you taking your medications as prescribed?  --   Pt reports he is unsure that he may be missing one of his heart medications. Advised pt to call MD office to f/u regarding medications.   Date Diagnosed  15 years ago      Health Coping   How would you rate your overall health?  Good      Psychosocial Assessment   Patient Belief/Attitude about Diabetes  Motivated to manage diabetes    Self-care barriers  Unable to determine    Self-management support  Family    Other persons present  Patient    Patient Concerns  Nutrition/Meal planning    Special Needs  Unable to determine    Preferred Learning Style  No preference indicated    Learning Readiness  Ready    How often do you need to have someone help you when you read instructions, pamphlets, or other written materials from your doctor or pharmacy?  1 - Never    What is the last grade level you completed in school?  12th grade.      Pre-Education Assessment   Patient understands the diabetes disease and treatment process.  Needs Instruction    Patient understands incorporating nutritional management into lifestyle.  Needs Instruction    Patient undertands incorporating physical activity into lifestyle.  Needs Instruction    Patient understands using medications safely.  Needs Review    Patient understands monitoring blood glucose, interpreting and using results  Needs Instruction    Patient understands prevention, detection, and treatment of acute complications.  Needs Instruction    Patient understands prevention, detection, and treatment of chronic complications.  Needs Instruction    Patient understands how to develop strategies to address psychosocial issues.  Needs Instruction    Patient understands  how to develop strategies to promote health/change behavior.  Needs Instruction      Complications   Last HgB A1C per patient/outside source  8.5 %    How often do you check your blood sugar?  1-2 times/day    Fasting Blood glucose range (mg/dL)  70-129;130-179    Postprandial Blood glucose range (mg/dL)  180-200;130-179    Number of hypoglycemic episodes per month  0    Number of hyperglycemic episodes per week  0    Have you had a dilated eye exam in the past 12 months?  Yes    Have you had a dental exam in the past 12 months?  Yes    Are you checking your feet?  Yes    How many days per week are you checking your feet?  7      Dietary Intake   Breakfast   630-7AM: powdered eggs, 1 piece [place] sausage, water    Lunch  1 PM: fried whitey fish, hushpuppies, small amount of fried shrimp, water    Dinner  7-8 PM: a few fried shrimp and a few hushpuppes from lunch    Beverage(s)  water      Exercise   Exercise Type  Light (walking / raking leaves)    How many days per week to you exercise?  5    How many minutes per day do you exercise?  30    Total minutes per week of exercise  150      Patient Education   Previous Diabetes Education  No    Disease state   Definition of diabetes, type 1 and 2, and the diagnosis of diabetes;Factors that contribute to the development of diabetes    Nutrition management   Carbohydrate counting;Food label reading, portion sizes and measuring food.;Role of diet in the treatment of diabetes and the relationship between the three main macronutrients and blood glucose level    Physical activity and exercise   Role of exercise on diabetes management, blood pressure control and cardiac health.    Medications  Reviewed patients medication for diabetes, action, purpose, timing of dose and side effects.    Acute complications  Taught treatment of hypoglycemia - the 15 rule.    Chronic complications  Dental care;Retinopathy and reason for yearly dilated eye exams;Relationship between chronic complications and blood glucose control;Assessed and discussed foot care and prevention of foot problems;Reviewed with patient heart disease, higher risk of, and prevention      Individualized Goals (developed by patient)   Nutrition  Follow meal plan discussed;General guidelines for healthy choices and portions discussed    Physical Activity  Exercise 3-5 times per week;30 minutes per day    Medications  take my medication as prescribed   Call doctor to review medications prescribed to ensure you are not missing anything.   Monitoring   test my blood glucose as discussed      Post-Education Assessment   Patient understands the diabetes  disease and treatment process.  Demonstrates understanding / competency    Patient understands incorporating nutritional management into lifestyle.  Demonstrates understanding / competency    Patient undertands incorporating physical activity into lifestyle.  Demonstrates understanding / competency    Patient understands using medications safely.  Needs Review    Patient understands monitoring blood glucose, interpreting and using results  Demonstrates understanding / competency    Patient understands prevention, detection, and treatment of acute complications.  Demonstrates understanding / competency    Patient  understands prevention, detection, and treatment of chronic complications.  Demonstrates understanding / competency    Patient understands how to develop strategies to address psychosocial issues.  Demonstrates understanding / competency    Patient understands how to develop strategies to promote health/change behavior.  Demonstrates understanding / competency      Outcomes   Expected Outcomes  Demonstrated interest in learning. Expect positive outcomes    Future DMSE  4-6 wks    Program Status  Completed       Individualized Plan for Diabetes Self-Management Training:   Learning Objective:  Patient will have a greater understanding of diabetes self-management. Patient education plan is to attend individual and/or group sessions per assessed needs and concerns.   Plan: Dietitian provided DSME. Discussed specific goals and recommendations for pt. Discussed scheduling pt with one of our office's CDE dietitians, Antonieta Iba, for next appointment as she can provide more specific education regarding insulin as pt is planning to start a fast acting insulin if his insurance will cover. Pt appeared agreeable to information/goals discussed.   Instructions/Goals:   Eat 3 meals per day/avoid going more than 4-5 hours without eating. If meals are spaced more than 5 hours apart include a  balanced snack (see handout)   At meals, include a consistent amount of carbohydrate. Recommend starting with 2-3 carbohydrate choices/servings which equals 30-45 grams of carbohydrates and if this is not enough food or less than you usually eat can include 3-4 carbohydrate choices/servings per meal which equals 45-60 grams of carbohydrates per meal. Main goal is to include a consistent amount at each mealtime.    Recommend checking blood sugar first thing in the morning and 1-2 hours after a meal at least 1 time daily for a total of checking at least 2 times per day. If you start taking fast acting insulin during the day you will need to check more often, would be good to then check before meals as well. Want to write down blood sugar numbers, time you checked and last time you ate and bring to appointments. Also want to carry glucose monitor with you in case you need to check while out or at work.    Call doctor's office to check about which medications you have been prescribed to ensure you aren't missing any medications.    Continue with physical activity routine. Good job staying active!    Patient Instructions  Instructions/Goals:   Eat 3 meals per day/avoid going more than 4-5 hours without eating. If meals are spaced more than 5 hours apart include a balanced snack (see handout)   At meals, include a consistent amount of carbohydrate. Recommend starting with 2-3 carbohydrate choices/servings which equals 30-45 grams of carbohydrates and if this is not enough food or less than you usually eat can include 3-4 carbohydrate choices/servings per meal which equals 45-60 grams of carbohydrates per meal. Main goal is to include a consistent amount at each mealtime.    Recommend checking blood sugar first thing in the morning and 1-2 hours after a meal at least 1 time daily for a total of checking at least 2 times per day. If you start taking fast acting insulin during the day you will need to  check more often, would be good to then check before meals as well. Want to write down blood sugar numbers, time you checked and last time you ate and bring to appointments. Also want to carry glucose monitor with you in case you need to check  while out or at work.    Call doctor's office to check about which medications you have been prescribed to ensure you aren't missing any medications.    Continue with physical activity routine. Good job staying active!     Expected Outcomes:  Demonstrated interest in learning. Expect positive outcomes  Education material provided: ADA - How to Thrive: A Guide for Your Journey with Diabetes, Meal plan card, My Plate and Snack sheet  If problems or questions, patient to contact team via:  Phone and Email  Future DSME appointment: 4-6 wks

## 2019-02-14 DIAGNOSIS — M47816 Spondylosis without myelopathy or radiculopathy, lumbar region: Secondary | ICD-10-CM | POA: Diagnosis not present

## 2019-02-14 DIAGNOSIS — M545 Low back pain: Secondary | ICD-10-CM | POA: Diagnosis not present

## 2019-02-14 MED FILL — MELOXICAM 7.5 MG TABLET: 7.5 | 30 days supply | Qty: 30 | Fill #0

## 2019-02-22 ENCOUNTER — Other Ambulatory Visit: Payer: Self-pay | Admitting: Internal Medicine

## 2019-02-22 DIAGNOSIS — E118 Type 2 diabetes mellitus with unspecified complications: Secondary | ICD-10-CM

## 2019-02-22 MED ORDER — HUMALOG KWIKPEN 200 UNIT/ML ~~LOC~~ SOPN: 5.0000 [IU] | PEN_INJECTOR | Freq: Three times a day (TID) | SUBCUTANEOUS | 1 refills | Status: DC

## 2019-02-22 MED FILL — HUMALOG 200 UNITS/ML KWIKPE: 200 | 80 days supply | Qty: 6 | Fill #0

## 2019-02-24 ENCOUNTER — Encounter: Payer: Self-pay | Admitting: Internal Medicine

## 2019-02-24 ENCOUNTER — Ambulatory Visit (INDEPENDENT_AMBULATORY_CARE_PROVIDER_SITE_OTHER): Payer: 59 | Admitting: Internal Medicine

## 2019-02-24 ENCOUNTER — Other Ambulatory Visit: Payer: Self-pay

## 2019-02-24 VITALS — BP 130/70 | HR 70 | Temp 98.4°F | Resp 16 | Ht 71.0 in | Wt 157.0 lb

## 2019-02-24 DIAGNOSIS — E118 Type 2 diabetes mellitus with unspecified complications: Secondary | ICD-10-CM

## 2019-02-24 DIAGNOSIS — Z23 Encounter for immunization: Secondary | ICD-10-CM

## 2019-02-24 DIAGNOSIS — I10 Essential (primary) hypertension: Secondary | ICD-10-CM | POA: Diagnosis not present

## 2019-02-24 NOTE — Progress Notes (Signed)
Subjective:  Patient ID: Dale Anderson, male    DOB: 1929/11/11  Age: 85 y.o. MRN: [id number]  CC: Hypertension and Diabetes   HPI Jay Haskew Vanderploeg presents for f/up - He tells me his blood sugars have been in the 110-1 25 range.  His short acting insulin had to be changed due to a an insurance formulary.  He denies any recent polys.  Outpatient Medications Prior to Visit  Medication Sig Dispense Refill  . ALPRAZolam (XANAX) 0.5 MG tablet Take 1 tablet (0.5 mg total) by mouth daily as needed. for anxiety 30 tablet 3  . ASPIRIN 81 PO Take by mouth.    Marland Kitchen atorvastatin (LIPITOR) 20 MG tablet TAKE 1 TABLET BY MOUTH DAILY. 90 tablet 1  . blood glucose meter kit and supplies KIT Use to test blood sugar once daily. DX: E11.9 1 each 0  . Blood Glucose Monitoring Suppl (FREESTYLE LITE) DEVI   0  . chlorthalidone (HYGROTON) 25 MG tablet TAKE 1 TABLET (25 MG TOTAL) BY MOUTH DAILY. 90 tablet 0  . Cholecalciferol (VITAMIN D) 1000 UNITS capsule Take 1,000 Units by mouth daily.      Marland Kitchen glucose blood (FREESTYLE LITE) test strip USE TO CHECK BLOOD SUGAR ONCE DAILY 100 strip 1  . Insulin Degludec (TRESIBA FLEXTOUCH) 200 UNIT/ML SOPN Inject 20 Units into the skin daily. 6 mL 1  . Insulin Lispro (HUMALOG KWIKPEN) 200 UNIT/ML SOPN Inject 5 Units into the skin 3 (three) times daily with meals. 9 mL 1  . Insulin Pen Needle 31G X 6 MM MISC 1 each by Does not apply route daily. Dx: E11.8 100 each 1  . Lancets MISC Use to test blood sugar once daily. DX E11.09 100 each 3  . levocetirizine (XYZAL) 5 MG tablet Take 1 tablet (5 mg total) by mouth every evening. 90 tablet 1  . Multiple Vitamins-Minerals (CENTRUM SILVER PO) Take by mouth.    . pantoprazole (PROTONIX) 40 MG tablet TAKE 1 TABLET BY MOUTH ONCE DAILY 90 tablet 1  . tamsulosin (FLOMAX) 0.4 MG CAPS capsule TAKE 1 CAPSULE BY MOUTH DAILY. 90 capsule 1  . telmisartan (MICARDIS) 40 MG tablet TAKE 1 TABLET BY MOUTH DAILY. 90 tablet 1   No  facility-administered medications prior to visit.     ROS Review of Systems  Constitutional: Negative for diaphoresis, fatigue and unexpected weight change.  HENT: Negative.   Eyes: Negative for visual disturbance.  Respiratory: Negative for cough, chest tightness, shortness of breath and wheezing.   Cardiovascular: Negative for chest pain, palpitations and leg swelling.  Gastrointestinal: Negative for abdominal pain, diarrhea, nausea and vomiting.  Endocrine: Negative for polydipsia, polyphagia and polyuria.  Genitourinary: Negative.  Negative for difficulty urinating.  Musculoskeletal: Negative.  Negative for arthralgias and myalgias.  Skin: Negative.  Negative for color change.  Neurological: Negative for dizziness, weakness, light-headedness and headaches.  Hematological: Negative for adenopathy. Does not bruise/bleed easily.  Psychiatric/Behavioral: Negative.     Objective:  BP 130/70 (BP Location: Left Arm, Patient Position: Sitting, Cuff Size: Normal)   Pulse 70   Temp 98.4 F (36.9 C) (Oral)   Ht _0  (1.803 m)   Wt 157 lb (71.2 kg)   SpO2 95%   BMI 21.90 kg/m   BP Readings from Last 3 Encounters:  02/24/19 130/70  01/27/19 (!) 148/70  12/22/18 140/84    Wt Readings from Last 3 Encounters:  02/24/19 157 lb (71.2 kg)  02/16/19 158 lb 1.6 oz (71.7 kg)  01/27/19 160 lb 8 oz (72.8 kg)    Physical Exam Vitals signs reviewed.  Constitutional:      General: He is not in acute distress.    Appearance: He is not ill-appearing, toxic-appearing or diaphoretic.  HENT:     Nose: Nose normal.     Mouth/Throat:     Mouth: Mucous membranes are moist.  Eyes:     General: No scleral icterus.    Conjunctiva/sclera: Conjunctivae normal.  Neck:     Musculoskeletal: Normal range of motion and neck supple.  Cardiovascular:     Rate and Rhythm: Normal rate and regular rhythm.     Heart sounds: No murmur.  Pulmonary:     Effort: Pulmonary effort is normal.     Breath  sounds: No wheezing, rhonchi or rales.  Abdominal:     General: Abdomen is flat. Bowel sounds are normal. There is no distension.     Palpations: There is no hepatomegaly or splenomegaly.  Musculoskeletal: Normal range of motion.     Right lower leg: No edema.     Left lower leg: No edema.  Skin:    General: Skin is warm and dry.     Coloration: Skin is not pale.  Neurological:     General: No focal deficit present.     Mental Status: He is alert.  Psychiatric:        Mood and Affect: Mood normal.     Lab Results  Component Value Date   WBC 6.0 12/22/2018   HGB 13.7 12/22/2018   HCT 40.6 12/22/2018   PLT 193.0 12/22/2018   GLUCOSE 111 (H) 12/22/2018   CHOL 136 12/22/2018   TRIG 80.0 12/22/2018   HDL 47.60 12/22/2018   LDLCALC 72 12/22/2018   ALT 16 12/22/2018   AST 15 12/22/2018   NA 140 12/22/2018   K 4.3 12/22/2018   CL 106 12/22/2018   CREATININE 1.67 (H) 12/22/2018   BUN 29 (H) 12/22/2018   CO2 26 12/22/2018   TSH 3.50 05/24/2018   PSA 2.55 08/21/2015   HGBA1C 8.1 (H) 12/22/2018   MICROALBUR 1.8 05/24/2018    No results found.  Assessment & Plan:   Kamonte was seen today for hypertension and diabetes.  Diagnoses and all orders for this visit:  Essential hypertension- His blood pressure is adequately well controlled.  Type II diabetes mellitus with manifestations ([place])- NovoLog has been changed to Humalog at the request of his insurance company.  He is doing well on the basal insulin and will use the short acting insulin for blood sugar spikes above 200.  Need for influenza vaccination -     Flu Vaccine QUAD High Dose(Fluad)   I am having Tiquan G. Moman maintain his Vitamin D, blood glucose meter kit and supplies, Lancets, FreeStyle Lite, levocetirizine, ALPRAZolam, tamsulosin, chlorthalidone, pantoprazole, atorvastatin, Tresiba FlexTouch, Insulin Pen Needle, telmisartan, FREESTYLE LITE, Multiple Vitamins-Minerals (CENTRUM SILVER PO), ASPIRIN 81 PO,  and HumaLOG KwikPen.  No orders of the defined types were placed in this encounter.    Follow-up: No follow-ups on file.  Scarlette Calico, MD

## 2019-02-25 ENCOUNTER — Encounter: Payer: Self-pay | Admitting: Internal Medicine

## 2019-02-25 NOTE — Patient Instructions (Signed)
Type 2 Diabetes Mellitus, Diagnosis, Adult Type 2 diabetes (type 2 diabetes mellitus) is a long-term (chronic) disease. In type 2 diabetes, one or both of these problems may be present:  The pancreas does not make enough of a hormone called insulin.  Cells in the body do not respond properly to insulin that the body makes (insulin resistance). Normally, insulin allows blood sugar (glucose) to enter cells in the body. The cells use glucose for energy. Insulin resistance or lack of insulin causes excess glucose to build up in the blood instead of going into cells. As a result, high blood glucose (hyperglycemia) develops. What increases the risk? The following factors may make you more likely to develop type 2 diabetes:  Having a family member with type 2 diabetes.  Being overweight or obese.  Having an inactive (sedentary) lifestyle.  Having been diagnosed with insulin resistance.  Having a history of prediabetes, gestational diabetes, or polycystic ovary syndrome (PCOS).  Being of American-Indian, African-American, Hispanic/Latino, or Asian/Pacific Islander descent. What are the signs or symptoms? In the early stage of this condition, you may not have symptoms. Symptoms develop slowly and may include:  Increased thirst (polydipsia).  Increased hunger(polyphagia).  Increased urination (polyuria).  Increased urination during the night (nocturia).  Unexplained weight loss.  Frequent infections that keep coming back (recurring).  Fatigue.  Weakness.  Vision changes, such as blurry vision.  Cuts or bruises that are slow to heal.  Tingling or numbness in the hands or feet.  Dark patches on the skin (acanthosis nigricans). How is this diagnosed? This condition is diagnosed based on your symptoms, your medical history, a physical exam, and your blood glucose level. Your blood glucose may be checked with one or more of the following blood tests:  A fasting blood glucose (FBG)  test. You will not be allowed to eat (you will fast) for 8 hours or longer before a blood sample is taken.  A random blood glucose test. This test checks blood glucose at any time of day regardless of when you ate.  An A1c (hemoglobin A1c) blood test. This test provides information about blood glucose control over the previous 2-3 months.  An oral glucose tolerance test (OGTT). This test measures your blood glucose at two times: ? After fasting. This is your baseline blood glucose level. ? Two hours after drinking a beverage that contains glucose. You may be diagnosed with type 2 diabetes if:  Your FBG level is 126 mg/dL (7.0 mmol/L) or higher.  Your random blood glucose level is 200 mg/dL (11.1 mmol/L) or higher.  Your A1c level is 6.5% or higher.  Your OGTT result is higher than 200 mg/dL (11.1 mmol/L). These blood tests may be repeated to confirm your diagnosis. How is this treated? Your treatment may be managed by a specialist called an endocrinologist. Type 2 diabetes may be treated by following instructions from your health care provider about:  Making diet and lifestyle changes. This may include: ? Following an individualized nutrition plan that is developed by a diet and nutrition specialist (registered dietitian). ? Exercising regularly. ? Finding ways to manage stress.  Checking your blood glucose level as often as told.  Taking diabetes medicines or insulin daily. This helps to keep your blood glucose levels in the healthy range. ? If you use insulin, you may need to adjust the dosage depending on how physically active you are and what foods you eat. Your health care provider will tell you how to adjust your dosage.    Taking medicines to help prevent complications from diabetes, such as: ? Aspirin. ? Medicine to lower cholesterol. ? Medicine to control blood pressure. Your health care provider will set individualized treatment goals for you. Your goals will be based on  your age, other medical conditions you have, and how you respond to diabetes treatment. Generally, the goal of treatment is to maintain the following blood glucose levels:  Before meals (preprandial): 80-130 mg/dL (4.4-7.2 mmol/L).  After meals (postprandial): below 180 mg/dL (10 mmol/L).  A1c level: less than 7%. Follow these instructions at home: Questions to ask your health care provider  Consider asking the following questions: ? Do I need to meet with a diabetes educator? ? Where can I find a support group for people with diabetes? ? What equipment will I need to manage my diabetes at home? ? What diabetes medicines do I need, and when should I take them? ? How often do I need to check my blood glucose? ? What number can I call if I have questions? ? When is my next appointment? General instructions  Take over-the-counter and prescription medicines only as told by your health care provider.  Keep all follow-up visits as told by your health care provider. This is important.  For more information about diabetes, visit: ? American Diabetes Association (ADA): www.diabetes.org ? American Association of Diabetes Educators (AADE): www.diabeteseducator.org Contact a health care provider if:  Your blood glucose is at or above 240 mg/dL (13.3 mmol/L) for 2 days in a row.  You have been sick or have had a fever for 2 days or longer, and you are not getting better.  You have any of the following problems for more than 6 hours: ? You cannot eat or drink. ? You have nausea and vomiting. ? You have diarrhea. Get help right away if:  Your blood glucose is lower than 54 mg/dL (3.0 mmol/L).  You become confused or you have trouble thinking clearly.  You have difficulty breathing.  You have moderate or large ketone levels in your urine. Summary  Type 2 diabetes (type 2 diabetes mellitus) is a long-term (chronic) disease. In type 2 diabetes, the pancreas does not make enough of a  hormone called insulin, or cells in the body do not respond properly to insulin that the body makes (insulin resistance).  This condition is treated by making diet and lifestyle changes and taking diabetes medicines or insulin.  Your health care provider will set individualized treatment goals for you. Your goals will be based on your age, other medical conditions you have, and how you respond to diabetes treatment.  Keep all follow-up visits as told by your health care provider. This is important. This information is not intended to replace advice given to you by your health care provider. Make sure you discuss any questions you have with your health care provider. Document Released: 06/09/2005 Document Revised: 08/07/2017 Document Reviewed: 07/13/2015 Elsevier Patient Education  2020 [employer]  

## 2019-03-14 ENCOUNTER — Other Ambulatory Visit: Payer: Self-pay

## 2019-03-14 ENCOUNTER — Encounter: Payer: Self-pay | Admitting: Neurology

## 2019-03-14 ENCOUNTER — Ambulatory Visit (INDEPENDENT_AMBULATORY_CARE_PROVIDER_SITE_OTHER): Payer: 59 | Admitting: Neurology

## 2019-03-14 VITALS — BP 110/68 | HR 76 | Ht 71.0 in | Wt 163.0 lb

## 2019-03-14 DIAGNOSIS — M48061 Spinal stenosis, lumbar region without neurogenic claudication: Secondary | ICD-10-CM | POA: Diagnosis not present

## 2019-03-14 DIAGNOSIS — R202 Paresthesia of skin: Secondary | ICD-10-CM

## 2019-03-14 NOTE — Progress Notes (Addendum)
[facility] Clinic Note - Initial Visit   Date: 03/14/19  Dale Anderson MRN: [id number] DOB: 09/09/1929   Dear Dr. Ronnald Anderson:  Thank you for your kind referral of Dale Anderson for consultation of bilateral feet numbness. Although his history is well known to you, please allow [place] to reiterate it for the purpose of our medical record. The patient was accompanied to the clinic by self.  History of Present Illness: Dale Anderson is a 85 y.o. right-handed male with insulin-dependent diabetes mellitus (HbA1c 8.1), hyperlipidemia, and GERD presenting for evaluation of bilateral feet numbness.   Over the past year, he began noticing numbness over the soles of the feet.  He has intermittent sharp pain in the big toe, which occurs throughout the day.  His balance is good and he walks unassisted.  He denies any weakness in the legs.  He has low back pain and had ESI by Dr. Ron Anderson.  He had MRI lumbar spine at [facility] and was told he has arthritis.   He is very active for his age and continues to work full-time at [employer] in [place].   Out-side paper records, electronic medical record, and images have been reviewed where available and summarized as:  Lab Results  Component Value Date   HGBA1C 8.1 (H) 12/22/2018   Lab Results  Component Value Date   GOTLXBWI20 355 12/22/2018   Lab Results  Component Value Date   TSH 3.50 05/24/2018   Lab Results  Component Value Date   ESRSEDRATE 18 (H) 12/21/2007    Past Medical History:  Diagnosis Date  . Adhesive capsulitis of right shoulder   . Anxiety   . Diabetes mellitus ([place])   . DJD (degenerative joint disease)   . GERD (gastroesophageal reflux disease)   . Hemorrhoid   . History of shingles   . Hyperlipidemia   . Hypertension   . Nodular prostate without urinary obstruction   . Recurrent bronchiectasis ([place])   . Tear of right rotator cuff     Past Surgical History:  Procedure  Laterality Date  . COLONOSCOPY  1998  . HAND RECONSTRUCTION     right  . INGUINAL HERNIA REPAIR  2006   Right.  Dr. Bubba Anderson  . ROTATOR CUFF REPAIR  1995   Left.  Dr. Wonda Anderson  . SHOULDER ARTHROSCOPY WITH ROTATOR CUFF REPAIR AND SUBACROMIAL DECOMPRESSION  05/25/2012   Procedure: SHOULDER ARTHROSCOPY WITH ROTATOR CUFF REPAIR AND SUBACROMIAL DECOMPRESSION;  Surgeon: Dale Junes, MD;  Location: [facility];  Service: Orthopedics;  Laterality: Right;  Right Shoulder Arthroscopy shoulder decompression subacromial partial acromioplasty with coracoaromial release, arthroscopy shoulder distal claviculectomy, arthroscopy shoulder with rotator cuff repai     Medications:  Outpatient Encounter Medications as of 03/14/2019  Medication Sig  . ALPRAZolam (XANAX) 0.5 MG tablet Take 1 tablet (0.5 mg total) by mouth daily as needed. for anxiety  . ASPIRIN 81 PO Take by mouth.  Marland Kitchen atorvastatin (LIPITOR) 20 MG tablet TAKE 1 TABLET BY MOUTH DAILY.  . blood glucose meter kit and supplies KIT Use to test blood sugar once daily. DX: E11.9  . Blood Glucose Monitoring Suppl (FREESTYLE LITE) DEVI   . chlorthalidone (HYGROTON) 25 MG tablet TAKE 1 TABLET (25 MG TOTAL) BY MOUTH DAILY.  Marland Kitchen Cholecalciferol (VITAMIN D) 1000 UNITS capsule Take 1,000 Units by mouth daily.    Marland Kitchen glucose blood (FREESTYLE LITE) test strip USE TO CHECK BLOOD SUGAR ONCE DAILY  . Insulin Degludec (  TRESIBA FLEXTOUCH) 200 UNIT/ML SOPN Inject 20 Units into the skin daily.  . Insulin Lispro (HUMALOG KWIKPEN) 200 UNIT/ML SOPN Inject 5 Units into the skin 3 (three) times daily with meals.  . Insulin Pen Needle 31G X 6 MM MISC 1 each by Does not apply route daily. Dx: E11.8  . Lancets MISC Use to test blood sugar once daily. DX E11.09  . levocetirizine (XYZAL) 5 MG tablet Take 1 tablet (5 mg total) by mouth every evening.  . Multiple Vitamins-Minerals (CENTRUM SILVER PO) Take by mouth.  . pantoprazole (PROTONIX) 40 MG tablet TAKE 1 TABLET  BY MOUTH ONCE DAILY  . tamsulosin (FLOMAX) 0.4 MG CAPS capsule TAKE 1 CAPSULE BY MOUTH DAILY.  Marland Kitchen telmisartan (MICARDIS) 40 MG tablet TAKE 1 TABLET BY MOUTH DAILY.   No facility-administered encounter medications on file as of 03/14/2019.     Allergies:  Allergies  Allergen Reactions  . Lisinopril Cough    Family History: Family History  Problem Relation Age of Onset  . Heart disease Father        MI at age 2  . Emphysema Other        cousin  . Colon cancer Neg Hx     Social History: Social History   Tobacco Use  . Smoking status: Never Smoker  . Smokeless tobacco: Never Used  Substance Use Topics  . Alcohol use: No  . Drug use: No   Social History   Social History Narrative   One story home   Right handed   Works for security at [employer]       One son    Review of Systems:  CONSTITUTIONAL: No fevers, chills, night sweats, or weight loss.   EYES: No visual changes or eye pain ENT: No hearing changes.  No history of nose bleeds.   RESPIRATORY: No cough, wheezing and shortness of breath.   CARDIOVASCULAR: Negative for chest pain, and palpitations.   GI: Negative for abdominal discomfort, blood in stools or black stools.  No recent change in bowel habits.   GU:  No history of incontinence.   MUSCLOSKELETAL: No history of joint pain or swelling.  No myalgias.   SKIN: Negative for lesions, rash, and itching.   HEMATOLOGY/ONCOLOGY: Negative for prolonged bleeding, bruising easily, and swollen nodes.  No history of cancer.   ENDOCRINE: Negative for cold or heat intolerance, polydipsia or goiter.   PSYCH:  No depression or anxiety symptoms.   NEURO: As Above.   Vital Signs:  BP 110/68   Pulse 76   Ht '5\' 11"'  (1.803 m)   Wt 163 lb (73.9 kg)   SpO2 95%   BMI 22.73 kg/m    General Medical Exam:   General:  Well appearing, comfortable.   Eyes/ENT: see cranial nerve examination.   Neck:   No carotid bruits. Respiratory:  Clear to auscultation, good air entry  bilaterally.   Cardiac:  Regular rate and rhythm, no murmur.   Extremities:  No deformities, edema, or skin discoloration.  Skin:  No rashes or lesions.  Neurological Exam: MENTAL STATUS including orientation to time, place, person, recent and remote memory, attention span and concentration, language, and fund of knowledge is normal.  Speech is not dysarthric.  CRANIAL NERVES: II:  No visual field defects.  III-IV-VI: Pupils equal round and reactive to light.  Normal conjugate, extra-ocular eye movements in all directions of gaze.  No nystagmus.  No ptosis.   V:  Normal facial sensation.  VII:  Normal facial symmetry and movements.   VIII:  Normal hearing and vestibular function.   IX-X:  Normal palatal movement.   XI:  Normal shoulder shrug and head rotation.   XII:  Normal tongue strength and range of motion, no deviation or fasciculation.  MOTOR:  No atrophy, fasciculations or abnormal movements.  No pronator drift.   Upper Extremity:  Right  Left  Deltoid  5/5   5/5   Biceps  5/5   5/5   Triceps  5/5   5/5   Infraspinatus 5/5  5/5  Medial pectoralis 5/5  5/5  Wrist extensors  5/5   5/5   Wrist flexors  5/5   5/5   Finger extensors  5/5   5/5   Finger flexors  5/5   5/5   Dorsal interossei  5/5   5/5   Abductor pollicis  5/5   5/5   Tone (Ashworth scale)  0  0   Lower Extremity:  Right  Left  Hip flexors  5/5   5/5   Hip extensors  5/5   5/5   Adductor 5/5  5/5  Abductor 5/5  5/5  Knee flexors  5/5   5/5   Knee extensors  5/5   5/5   Dorsiflexors  5/5   5/5   Plantarflexors  5/5   5/5   Toe extensors  5/5   5/5   Toe flexors  5/5   5/5   Tone (Ashworth scale)  0  0   MSRs:  Right        Left                  brachioradialis 2+  2+  biceps 2+  2+  triceps 2+  2+  patellar 3+  3+  ankle jerk 3+  3+  Hoffman no  no  plantar response down  down  Crossed adductors bilaterally  SENSORY:  Vibration is reduced at the ankles and absent distal to great toe  bilaterally.  Pin prick and temperature is reduced over the dorsum of the feet. Sensation in the arms is normal. Romberg's sign absent.   COORDINATION/GAIT: Normal finger-to- nose-finger..  Intact rapid alternating movements bilaterally.  Able to rise from a chair without using arms.  Gait mildly-wide based based and stable, unassisted.   IMPRESSION: Bilateral feet paresthesias.  Although he may have neuropathy, it is atypical for reflexes to be brisk or intact and therefore, he most likely has overlapping lumbosacral canal stenosis contributing to his symptoms.  I will request MRI lumbar spine from [facility] to understand the degree of structural pathology in the back.   He will also undergo NCS/EMG of the legs to evaluate for neuropathy.  Patient educated on daily foot inspection, fall prevention, and safety precautions around the home.  Further recommendations pending results.  Thank you for allowing me to participate in patient's care.  If I can answer any additional questions, I would be pleased to do so.    Sincerely,    Jenesis Martin K. Posey Pronto, DO

## 2019-03-14 NOTE — Patient Instructions (Addendum)
Nerve testing of the legs.  Do not apply any lotion, oils, or moisturizer on the legs.  I will request the MRI lumbar from [facility] (EMG/NCS) INSTRUCTIONS  How to Prepare The neurologist conducting the EMG will need to know if you have certain medical conditions. Tell the neurologist and other EMG lab personnel if you: . Have a pacemaker or any other electrical medical device . Take blood-thinning medications . Have hemophilia, a blood-clotting disorder that causes prolonged bleeding Bathing Take a shower or bath shortly before your exam in order to remove oils from your skin. Don't apply lotions or creams before the exam.  What to Expect You'll likely be asked to change into a hospital gown for the procedure and lie down on an examination table. The following explanations can help you understand what will happen during the exam.  . Electrodes. The neurologist or a technician places surface electrodes at various locations on your skin depending on where you're experiencing symptoms. Or the neurologist may insert needle electrodes at different sites depending on your symptoms.  . Sensations. The electrodes will at times transmit a tiny electrical current that you may feel as a twinge or spasm. The needle electrode may cause discomfort or pain that usually ends shortly after the needle is removed. If you are concerned about discomfort or pain, you may want to talk to the neurologist about taking a short break during the exam.  . Instructions. During the needle EMG, the neurologist will assess whether there is any spontaneous electrical activity when the muscle is at rest - activity that isn't present in healthy muscle tissue - and the degree of activity when you slightly contract the muscle.  He or she will give you instructions on resting and contracting a muscle at appropriate times. Depending on what muscles and nerves the neurologist  is examining, he or she may ask you to change positions during the exam.  After your EMG You may experience some temporary, minor bruising where the needle electrode was inserted into your muscle. This bruising should fade within several days. If it persists, contact your primary care doctor.

## 2019-03-15 ENCOUNTER — Ambulatory Visit (INDEPENDENT_AMBULATORY_CARE_PROVIDER_SITE_OTHER): Payer: PPO | Admitting: Neurology

## 2019-03-15 ENCOUNTER — Other Ambulatory Visit: Payer: Self-pay

## 2019-03-15 DIAGNOSIS — R202 Paresthesia of skin: Secondary | ICD-10-CM

## 2019-03-15 DIAGNOSIS — M5417 Radiculopathy, lumbosacral region: Secondary | ICD-10-CM | POA: Diagnosis not present

## 2019-03-15 NOTE — Progress Notes (Signed)
Follow-up Visit   Date: 03/15/19   Dale Anderson MRN: [id number] DOB: 08/24/1929   Interim History: Dale Anderson is a 85 y.o. right-handed male with insulin-dependent diabetes mellitus (HbA1c 8.1), hyperlipidemia, and GERD returning to the clinic for follow-up of bilateral feet numbness.  The patient was accompanied to the clinic by self.  History of present illness: Over the past year, he began noticing numbness over the soles of the feet.  He has intermittent sharp pain in the big toe, which occurs throughout the day.  His balance is good and he walks unassisted.  He denies any weakness in the legs.  He has low back pain and had ESI by Dr. Ron Agee.  He had MRI lumbar spine at [facility] and was told he has arthritis.   He is very active for his age and continues to work full-time at [employer] in [place] 03/15/2019:  He is here for electrodiagnostic testing of the legs.  Numbness of the feet is unchanged.    Medications:  Current Outpatient Medications on File Prior to Visit  Medication Sig Dispense Refill  . ALPRAZolam (XANAX) 0.5 MG tablet Take 1 tablet (0.5 mg total) by mouth daily as needed. for anxiety 30 tablet 3  . ASPIRIN 81 PO Take by mouth.    Marland Kitchen atorvastatin (LIPITOR) 20 MG tablet TAKE 1 TABLET BY MOUTH DAILY. 90 tablet 1  . blood glucose meter kit and supplies KIT Use to test blood sugar once daily. DX: E11.9 1 each 0  . Blood Glucose Monitoring Suppl (FREESTYLE LITE) DEVI   0  . chlorthalidone (HYGROTON) 25 MG tablet TAKE 1 TABLET (25 MG TOTAL) BY MOUTH DAILY. 90 tablet 0  . Cholecalciferol (VITAMIN D) 1000 UNITS capsule Take 1,000 Units by mouth daily.      Marland Kitchen glucose blood (FREESTYLE LITE) test strip USE TO CHECK BLOOD SUGAR ONCE DAILY 100 strip 1  . Insulin Degludec (TRESIBA FLEXTOUCH) 200 UNIT/ML SOPN Inject 20 Units into the skin daily. 6 mL 1  . Insulin Lispro (HUMALOG KWIKPEN) 200 UNIT/ML SOPN Inject 5 Units into the skin 3 (three)  times daily with meals. 9 mL 1  . Insulin Pen Needle 31G X 6 MM MISC 1 each by Does not apply route daily. Dx: E11.8 100 each 1  . Lancets MISC Use to test blood sugar once daily. DX E11.09 100 each 3  . levocetirizine (XYZAL) 5 MG tablet Take 1 tablet (5 mg total) by mouth every evening. 90 tablet 1  . Multiple Vitamins-Minerals (CENTRUM SILVER PO) Take by mouth.    . pantoprazole (PROTONIX) 40 MG tablet TAKE 1 TABLET BY MOUTH ONCE DAILY 90 tablet 1  . tamsulosin (FLOMAX) 0.4 MG CAPS capsule TAKE 1 CAPSULE BY MOUTH DAILY. 90 capsule 1  . telmisartan (MICARDIS) 40 MG tablet TAKE 1 TABLET BY MOUTH DAILY. 90 tablet 1   No current facility-administered medications on file prior to visit.     Allergies:  Allergies  Allergen Reactions  . Lisinopril Cough    Review of Systems:  CONSTITUTIONAL: No fevers, chills, night sweats, or weight loss.  EYES: No visual changes or eye pain ENT: No hearing changes.  No history of nose bleeds.   RESPIRATORY: No cough, wheezing and shortness of breath.   CARDIOVASCULAR: Negative for chest pain, and palpitations.   GI: Negative for abdominal discomfort, blood in stools or black stools.  No recent change in bowel habits.   GU:  No history  of incontinence.   MUSCLOSKELETAL: +history of joint pain or swelling.  No myalgias.   SKIN: Negative for lesions, rash, and itching.   ENDOCRINE: Negative for cold or heat intolerance, polydipsia or goiter.   PSYCH:  No depression or anxiety symptoms.   NEURO: As Above.  Neurological Exam: MENTAL STATUS including orientation to time, place, person, recent and remote memory, attention span and concentration, language, and fund of knowledge is normal.  Speech is not dysarthric.  CRANIAL NERVES: Pupils equal round and reactive to light.  Normal conjugate, extra-ocular eye movements in all directions of gaze.  No ptosis.    MOTOR:  Motor strength is 5/5 in all extremities.  No atrophy, fasciculations or abnormal  movements.  No pronator drift.  Tone is normal.    MSRs:                                           Right        Left brachioradialis 2+  2+  biceps 2+  2+  triceps 2+  2+  patellar 3+  3+  ankle jerk 3+  3+   COORDINATION/GAIT:   Gait narrow based and stable.   Data: NCS/EMG of the legs 03/15/2019: 1. Chronic L3-4 radiculopathy affecting bilateral lower extremities, moderate. 2. 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  IMPRESSION/PLAN: 1.  Mild diabetic neuropathy affecting the feet.  Discussed results of EMG which shows only a mild neuropathy, it is impressive that his sural sensory responses are still present and normal, as this is often absent in normal individuals > 31 years old.   Encouraged patient to keep good control of diabetes to minimize progression Patient educated on daily foot inspection, fall prevention, and safety precautions around the home.  2.  Lumbosacral radiculopathy and canal stenosis causing bilateral leg pain/paresthesias is most likely playing a greater role than his neuropathy.  He is followed by Dr. Ron Agee for low back pain.  MRI lumbar spine has been requested.   Return to clinic as needed  Greater than 50% of this 15 minute visit was spent in counseling, explanation of diagnosis, planning of further management, and coordination of care.   Thank you for allowing me to participate in patient's care.  If I can answer any additional questions, I would be pleased to do so.    Sincerely,    Ajanay Farve K. Posey Pronto, DO

## 2019-03-15 NOTE — Procedures (Signed)
[facility]  [address], [place]  [place], [place] [address] Tel: [phone] Fax:  [phone] Test Date:  03/15/2019  Patient: Dale Anderson DOB: 12/19/1929 Physician: Narda Amber, DO  Sex: Male Height: 5\' 11"  Ref Phys: Narda Amber, DO  ID#: [id number] Temp: 32.0C Technician:    Patient Complaints: This is an 85 year old man referred for evaluation of bilateral feet numbness.  NCV & EMG Findings: Extensive electrodiagnostic testing of the right lower extremity and additional studies of the left shows:  1. Bilateral superficial peroneal sensory responses are absent.  Bilateral sural sensory responses are within normal limits 2. Bilateral peroneal and tibial motor responses are within normal limits. 3. Bilateral tibial H reflex studies are within normal limits.   4. Chronic motor axonal loss changes are seen affecting the L3-4 myotomes bilaterally, without accompanied active denervation.  Impression: 1. Chronic L3-4 radiculopathy affecting bilateral lower extremities, moderate. 2. 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   ___________________________ Narda Amber, DO    Nerve Conduction Studies Anti Sensory Summary Table   Site NR Peak (ms) Norm Peak (ms) P-T Amp (V) Norm P-T Amp  Left Sup Peroneal Anti Sensory (Ant Lat Mall)  32C  12 cm NR  <4.6  >3  Right Sup Peroneal Anti Sensory (Ant Lat Mall)  32C  12 cm NR  <4.6  >3  Left Sural Anti Sensory (Lat Mall)  32C  Calf    2.9 <4.6 6.5 >3  Right Sural Anti Sensory (Lat Mall)  32C  Calf    3.9 <4.6 4.4 >3   Motor Summary Table   Site NR Onset (ms) Norm Onset (ms) O-P Amp (mV) Norm O-P Amp Site1 Site2 Delta-0 (ms) Dist (cm) Vel (m/s) Norm Vel (m/s)  Left Peroneal Motor (Ext Dig Brev)  32C  Ankle    4.0 <6.0 5.6 >2.5 B Fib Ankle 9.7 42.0 43 >40  B Fib    13.7  4.7  Poplt B Fib 2.0 10.0 50 >40  Poplt    15.7  4.5         Right Peroneal Motor (Ext Dig Brev)  32C   Ankle    3.8 <6.0 4.8 >2.5 B Fib Ankle 10.0 40.0 40 >40  B Fib    13.8  4.7  Poplt B Fib 2.4 10.0 42 >40  Poplt    16.2  4.4         Left Tibial Motor (Abd Hall Brev)  32C  Ankle    5.2 <6.0 4.2 >4 Knee Ankle 10.1 45.0 45 >40  Knee    15.3  3.2         Right Tibial Motor (Abd Hall Brev)  32C  Ankle    5.4 <6.0 4.1 >4 Knee Ankle 10.0 44.0 44 >40  Knee    15.4  2.8          H Reflex Studies   NR H-Lat (ms) Lat Norm (ms) L-R H-Lat (ms)  Left Tibial (Gastroc)  32C     34.42 <35 0.41  Right Tibial (Gastroc)  32C     34.01 <35 0.41   EMG   Side Muscle Ins Act Fibs Psw Fasc Number Recrt Dur Dur. Amp Amp. Poly Poly. Comment  Right AntTibialis Nml Nml Nml Nml 1- Rapid Many 1+ Many 1+ Nml Nml N/A  Right Gastroc Nml Nml Nml Nml Nml Nml Nml Nml Nml Nml Nml Nml N/A  Right Flex Dig Long Nml Nml Nml Nml Nml  Nml Nml Nml Nml Nml Nml Nml N/A  Right RectFemoris Nml Nml Nml Nml 1- Rapid Some 1+ Some 1+ Nml Nml N/A  Right GluteusMed Nml Nml Nml Nml Nml Nml Nml Nml Nml Nml Nml Nml N/A  Left BicepsFemS Nml Nml Nml Nml Nml Nml Nml Nml Nml Nml Nml Nml N/A  Right BicepsFemS Nml Nml Nml Nml Nml Nml Nml Nml Nml Nml Nml Nml N/A  Left AntTibialis Nml Nml Nml Nml 1- Rapid Many 1+ Some 1+ Nml Nml N/A  Left Gastroc Nml Nml Nml Nml Nml Nml Nml Nml Nml Nml Nml Nml N/A  Left Flex Dig Long Nml Nml Nml Nml Nml Nml Nml Nml Nml Nml Nml Nml N/A  Left RectFemoris Nml Nml Nml Nml 1- Rapid Some 1+ Some 1+ Nml Nml N/A  Left GluteusMed Nml Nml Nml Nml Nml Nml Nml Nml Nml Nml Nml Nml N/A  Right AdductorLong Nml Nml Nml Nml 1- Rapid Some 1+ Some 1+ Nml Nml N/A      Waveforms:

## 2019-03-21 DIAGNOSIS — M545 Low back pain: Secondary | ICD-10-CM | POA: Diagnosis not present

## 2019-03-21 DIAGNOSIS — M47816 Spondylosis without myelopathy or radiculopathy, lumbar region: Secondary | ICD-10-CM | POA: Diagnosis not present

## 2019-03-24 ENCOUNTER — Ambulatory Visit (INDEPENDENT_AMBULATORY_CARE_PROVIDER_SITE_OTHER): Payer: 59 | Admitting: Podiatry

## 2019-03-24 ENCOUNTER — Other Ambulatory Visit: Payer: Self-pay

## 2019-03-24 DIAGNOSIS — M2042 Other hammer toe(s) (acquired), left foot: Secondary | ICD-10-CM

## 2019-03-24 DIAGNOSIS — E1149 Type 2 diabetes mellitus with other diabetic neurological complication: Secondary | ICD-10-CM

## 2019-03-24 DIAGNOSIS — M2041 Other hammer toe(s) (acquired), right foot: Secondary | ICD-10-CM | POA: Diagnosis not present

## 2019-03-24 DIAGNOSIS — T148XXA Other injury of unspecified body region, initial encounter: Secondary | ICD-10-CM

## 2019-03-24 MED ORDER — NONFORMULARY OR COMPOUNDED ITEM: 3 refills | Status: DC

## 2019-03-24 MED FILL — UNIFINE PENTIPS 6MM 31G: 31G X 6 MM | 90 days supply | Qty: 100 | Fill #1

## 2019-03-24 NOTE — Patient Instructions (Signed)
Diabetes Mellitus and Foot Care Foot care is an important part of your health, especially when you have diabetes. Diabetes may cause you to have problems because of poor blood flow (circulation) to your feet and legs, which can cause your skin to:  Become thinner and drier.  Break more easily.  Heal more slowly.  Peel and crack. You may also have nerve damage (neuropathy) in your legs and feet, causing decreased feeling in them. This means that you may not notice minor injuries to your feet that could lead to more serious problems. Noticing and addressing any potential problems early is the best way to prevent future foot problems. How to care for your feet Foot hygiene  Wash your feet daily with warm water and mild soap. Do not use hot water. Then, pat your feet and the areas between your toes until they are completely dry. Do not soak your feet as this can dry your skin.  Trim your toenails straight across. Do not dig under them or around the cuticle. File the edges of your nails with an emery board or nail file.  Apply a moisturizing lotion or petroleum jelly to the skin on your feet and to dry, brittle toenails. Use lotion that does not contain alcohol and is unscented. Do not apply lotion between your toes. Shoes and socks  Wear clean socks or stockings every day. Make sure they are not too tight. Do not wear knee-high stockings since they may decrease blood flow to your legs.  Wear shoes that fit properly and have enough cushioning. Always look in your shoes before you put them on to be sure there are no objects inside.  To break in new shoes, wear them for just a few hours a day. This prevents injuries on your feet. Wounds, scrapes, corns, and calluses  Check your feet daily for blisters, cuts, bruises, sores, and redness. If you cannot see the bottom of your feet, use a mirror or ask someone for help.  Do not cut corns or calluses or try to remove them with medicine.  If you  find a minor scrape, cut, or break in the skin on your feet, keep it and the skin around it clean and dry. You may clean these areas with mild soap and water. Do not clean the area with peroxide, alcohol, or iodine.  If you have a wound, scrape, corn, or callus on your foot, look at it several times a day to make sure it is healing and not infected. Check for: ? Redness, swelling, or pain. ? Fluid or blood. ? Warmth. ? Pus or a bad smell. General instructions  Do not cross your legs. This may decrease blood flow to your feet.  Do not use heating pads or hot water bottles on your feet. They may burn your skin. If you have lost feeling in your feet or legs, you may not know this is happening until it is too late.  Protect your feet from hot and cold by wearing shoes, such as at the beach or on hot pavement.  Schedule a complete foot exam at least once a year (annually) or more often if you have foot problems. If you have foot problems, report any cuts, sores, or bruises to your health care provider immediately. Contact a health care provider if:  You have a medical condition that increases your risk of infection and you have any cuts, sores, or bruises on your feet.  You have an injury that is not   healing.  You have redness on your legs or feet.  You feel burning or tingling in your legs or feet.  You have pain or cramps in your legs and feet.  Your legs or feet are numb.  Your feet always feel cold.  You have pain around a toenail. Get help right away if:  You have a wound, scrape, corn, or callus on your foot and: ? You have pain, swelling, or redness that gets worse. ? You have fluid or blood coming from the wound, scrape, corn, or callus. ? Your wound, scrape, corn, or callus feels warm to the touch. ? You have pus or a bad smell coming from the wound, scrape, corn, or callus. ? You have a fever. ? You have a red line going up your leg. Summary  Check your feet every day  for cuts, sores, red spots, swelling, and blisters.  Moisturize feet and legs daily.  Wear shoes that fit properly and have enough cushioning.  If you have foot problems, report any cuts, sores, or bruises to your health care provider immediately.  Schedule a complete foot exam at least once a year (annually) or more often if you have foot problems. This information is not intended to replace advice given to you by your health care provider. Make sure you discuss any questions you have with your health care provider. Document Released: 06/06/2000 Document Revised: 07/22/2017 Document Reviewed: 07/11/2016 Elsevier Patient Education  2020 [employer]  

## 2019-03-28 NOTE — Progress Notes (Signed)
Subjective: 85 year old male presents the office today for concerns of worsening neuropathy symptoms and is worse at nighttime.  States he will keep him awake at night.  Patient states he has not been on any medication for this.  Per chart he is followed up with Dr. Posey Pronto for lumbosacral radiculopathy and MRI has been recommended.  Denies any systemic complaints such as fevers, chills, nausea, vomiting. No acute ch any medic anges since last appointment, and no other complaints at this time.   Last A1c 8.1.  Objective: AAO x3, NAD DP/PT pulses palpable bilaterally, CRT less than 3 seconds Overall sensation intact does not want to monofilament.  There is no area of tenderness bilaterally.  MMT 5/5.  No edema, erythema. Superficial abrasion on the dorsal aspect the left second toe from rubbing inside shoes.  Hammertoe deformities present.  No edema, erythema. No open lesions or pre-ulcerative lesions.  No pain with calf compression, swelling, warmth, erythema  Assessment: 85 year old male diabetic neuropathy/lumbosacral radiculopathy; superficial wound left second toe  Plan: -All treatment options discussed with the patient including all alternatives, risks, complications.  -Discussed treatment options for him.  We will stop all medication.  Ordered a compound cream today through [facility] to include gabapentin. -Discussed importance of daily foot inspection. -Antibiotic ointment to left second toe with abrasion.  If not healed the next 1 to 2 weeks to let me know or sooner if any issues are to arise.  Monitor for any signs or symptoms of infection. -Patient encouraged to call the office with any questions, concerns, change in symptoms.   Return in about 3 months (around 06/24/2019).  Trula Slade DPM

## 2019-03-29 ENCOUNTER — Encounter: Payer: Self-pay | Admitting: Internal Medicine

## 2019-03-29 ENCOUNTER — Ambulatory Visit (INDEPENDENT_AMBULATORY_CARE_PROVIDER_SITE_OTHER): Payer: PPO | Admitting: Internal Medicine

## 2019-03-29 ENCOUNTER — Other Ambulatory Visit (INDEPENDENT_AMBULATORY_CARE_PROVIDER_SITE_OTHER): Payer: PPO

## 2019-03-29 ENCOUNTER — Other Ambulatory Visit: Payer: Self-pay

## 2019-03-29 VITALS — BP 164/72 | HR 53 | Temp 98.0°F | Ht 71.0 in | Wt 162.0 lb

## 2019-03-29 DIAGNOSIS — E1142 Type 2 diabetes mellitus with diabetic polyneuropathy: Secondary | ICD-10-CM | POA: Insufficient documentation

## 2019-03-29 DIAGNOSIS — S90821A Blister (nonthermal), right foot, initial encounter: Secondary | ICD-10-CM | POA: Diagnosis not present

## 2019-03-29 DIAGNOSIS — I10 Essential (primary) hypertension: Secondary | ICD-10-CM

## 2019-03-29 DIAGNOSIS — N1831 Chronic kidney disease, stage 3a: Secondary | ICD-10-CM

## 2019-03-29 DIAGNOSIS — E118 Type 2 diabetes mellitus with unspecified complications: Secondary | ICD-10-CM | POA: Diagnosis not present

## 2019-03-29 LAB — BASIC METABOLIC PANEL
BUN: 32 mg/dL — ABNORMAL HIGH (ref 6–23)
CO2: 26 mEq/L (ref 19–32)
Calcium: 9.4 mg/dL (ref 8.4–10.5)
Chloride: 107 mEq/L (ref 96–112)
Creatinine, Ser: 1.54 mg/dL — ABNORMAL HIGH (ref 0.40–1.50)
GFR: 51.98 mL/min — ABNORMAL LOW (ref >60.00)
Glucose, Bld: 123 mg/dL — ABNORMAL HIGH (ref 70–99)
Potassium: 4.2 mEq/L (ref 3.5–5.1)
Sodium: 141 mEq/L (ref 135–145)

## 2019-03-29 LAB — HEMOGLOBIN A1C: Hgb A1c MFr Bld: 7.7 % — ABNORMAL HIGH (ref 4.6–6.5)

## 2019-03-29 MED ORDER — METANX 3-90.314-2-35 MG PO CAPS: 1.0000 | ORAL_CAPSULE | Freq: Two times a day (BID) | ORAL | 1 refills | Status: DC

## 2019-03-29 MED ORDER — INSULIN PEN NEEDLE 31G X 6 MM MISC: 5 refills | Status: DC

## 2019-03-29 MED FILL — L-METHYLFOLATE-ALGAE-B12-B6: 3-90.314-2- | 30 days supply | Qty: 60 | Fill #0

## 2019-03-29 NOTE — Patient Instructions (Signed)
Diabetes Mellitus and Foot Care Foot care is an important part of your health, especially when you have diabetes. Diabetes may cause you to have problems because of poor blood flow (circulation) to your feet and legs, which can cause your skin to:  Become thinner and drier.  Break more easily.  Heal more slowly.  Peel and crack. You may also have nerve damage (neuropathy) in your legs and feet, causing decreased feeling in them. This means that you may not notice minor injuries to your feet that could lead to more serious problems. Noticing and addressing any potential problems early is the best way to prevent future foot problems. How to care for your feet Foot hygiene  Wash your feet daily with warm water and mild soap. Do not use hot water. Then, pat your feet and the areas between your toes until they are completely dry. Do not soak your feet as this can dry your skin.  Trim your toenails straight across. Do not dig under them or around the cuticle. File the edges of your nails with an emery board or nail file.  Apply a moisturizing lotion or petroleum jelly to the skin on your feet and to dry, brittle toenails. Use lotion that does not contain alcohol and is unscented. Do not apply lotion between your toes. Shoes and socks  Wear clean socks or stockings every day. Make sure they are not too tight. Do not wear knee-high stockings since they may decrease blood flow to your legs.  Wear shoes that fit properly and have enough cushioning. Always look in your shoes before you put them on to be sure there are no objects inside.  To break in new shoes, wear them for just a few hours a day. This prevents injuries on your feet. Wounds, scrapes, corns, and calluses  Check your feet daily for blisters, cuts, bruises, sores, and redness. If you cannot see the bottom of your feet, use a mirror or ask someone for help.  Do not cut corns or calluses or try to remove them with medicine.  If you  find a minor scrape, cut, or break in the skin on your feet, keep it and the skin around it clean and dry. You may clean these areas with mild soap and water. Do not clean the area with peroxide, alcohol, or iodine.  If you have a wound, scrape, corn, or callus on your foot, look at it several times a day to make sure it is healing and not infected. Check for: ? Redness, swelling, or pain. ? Fluid or blood. ? Warmth. ? Pus or a bad smell. General instructions  Do not cross your legs. This may decrease blood flow to your feet.  Do not use heating pads or hot water bottles on your feet. They may burn your skin. If you have lost feeling in your feet or legs, you may not know this is happening until it is too late.  Protect your feet from hot and cold by wearing shoes, such as at the beach or on hot pavement.  Schedule a complete foot exam at least once a year (annually) or more often if you have foot problems. If you have foot problems, report any cuts, sores, or bruises to your health care provider immediately. Contact a health care provider if:  You have a medical condition that increases your risk of infection and you have any cuts, sores, or bruises on your feet.  You have an injury that is not   healing.  You have redness on your legs or feet.  You feel burning or tingling in your legs or feet.  You have pain or cramps in your legs and feet.  Your legs or feet are numb.  Your feet always feel cold.  You have pain around a toenail. Get help right away if:  You have a wound, scrape, corn, or callus on your foot and: ? You have pain, swelling, or redness that gets worse. ? You have fluid or blood coming from the wound, scrape, corn, or callus. ? Your wound, scrape, corn, or callus feels warm to the touch. ? You have pus or a bad smell coming from the wound, scrape, corn, or callus. ? You have a fever. ? You have a red line going up your leg. Summary  Check your feet every day  for cuts, sores, red spots, swelling, and blisters.  Moisturize feet and legs daily.  Wear shoes that fit properly and have enough cushioning.  If you have foot problems, report any cuts, sores, or bruises to your health care provider immediately.  Schedule a complete foot exam at least once a year (annually) or more often if you have foot problems. This information is not intended to replace advice given to you by your health care provider. Make sure you discuss any questions you have with your health care provider. Document Released: 06/06/2000 Document Revised: 07/22/2017 Document Reviewed: 07/11/2016 Elsevier Patient Education  2020 [employer]  

## 2019-03-29 NOTE — Progress Notes (Signed)
Subjective:  Patient ID: Dale Anderson, male    DOB: [birth year]  Age: 85 y.o. MRN: [id number]  CC: Hypertension and Diabetes   HPI Dale Anderson presents for f/up - He complains of a blister on the top of his right foot.  He says he noticed it after he cut the grass 2 days ago.  The blister is not bothering him much.  Since I last saw him he has undergone a NCS/EMG.  He has been diagnosed with diabetic neuropathy.  He tells me his neurologist wanted him to use something topically but his insurance would not pay for it so now he thinks he is supposed to be taking an oral medication.  Outpatient Medications Prior to Visit  Medication Sig Dispense Refill  . ALPRAZolam (XANAX) 0.5 MG tablet Take 1 tablet (0.5 mg total) by mouth daily as needed. for anxiety 30 tablet 3  . ASPIRIN 81 PO Take by mouth.    Marland Kitchen atorvastatin (LIPITOR) 20 MG tablet TAKE 1 TABLET BY MOUTH DAILY. 90 tablet 1  . blood glucose meter kit and supplies KIT Use to test blood sugar once daily. DX: E11.9 1 each 0  . Blood Glucose Monitoring Suppl (FREESTYLE LITE) DEVI   0  . chlorthalidone (HYGROTON) 25 MG tablet TAKE 1 TABLET (25 MG TOTAL) BY MOUTH DAILY. 90 tablet 0  . Cholecalciferol (VITAMIN D) 1000 UNITS capsule Take 1,000 Units by mouth daily.      Marland Kitchen glucose blood (FREESTYLE LITE) test strip USE TO CHECK BLOOD SUGAR ONCE DAILY 100 strip 1  . Insulin Degludec (TRESIBA FLEXTOUCH) 200 UNIT/ML SOPN Inject 20 Units into the skin daily. 6 mL 1  . Insulin Lispro (HUMALOG KWIKPEN) 200 UNIT/ML SOPN Inject 5 Units into the skin 3 (three) times daily with meals. 9 mL 1  . Lancets MISC Use to test blood sugar once daily. DX E11.09 100 each 3  . levocetirizine (XYZAL) 5 MG tablet Take 1 tablet (5 mg total) by mouth every evening. 90 tablet 1  . Multiple Vitamins-Minerals (CENTRUM SILVER PO) Take by mouth.    . NONFORMULARY OR COMPOUNDED ITEM Peripheral Neuropathy Cream: Bupivacaine 1%, Doxepin 3%, Gabapentin 6%,  Pentoxifylline 3%, Topiramate 1% Order faxed to [place] 1 each 3  . pantoprazole (PROTONIX) 40 MG tablet TAKE 1 TABLET BY MOUTH ONCE DAILY 90 tablet 1  . tamsulosin (FLOMAX) 0.4 MG CAPS capsule TAKE 1 CAPSULE BY MOUTH DAILY. 90 capsule 1  . telmisartan (MICARDIS) 40 MG tablet TAKE 1 TABLET BY MOUTH DAILY. 90 tablet 1  . Insulin Pen Needle 31G X 6 MM MISC 1 each by Does not apply route daily. Dx: E11.8 100 each 1   No facility-administered medications prior to visit.     ROS Review of Systems  Constitutional: Negative.   Eyes: Negative.   Respiratory: Negative for cough, chest tightness, shortness of breath and wheezing.   Cardiovascular: Negative for chest pain, palpitations and leg swelling.  Gastrointestinal: Negative for abdominal pain, diarrhea, nausea and vomiting.  Genitourinary: Negative.  Negative for difficulty urinating.  Skin: Positive for wound.  Neurological: Negative.  Negative for dizziness and weakness.  Hematological: Negative for adenopathy. Does not bruise/bleed easily.  Psychiatric/Behavioral: Negative.     Objective:  BP (!) 164/72 (BP Location: Left Arm, Patient Position: Sitting, Cuff Size: Normal)   Pulse (!) 53   Temp 98 F (36.7 C) (Oral)   Ht _0  (1.803 m)   Wt 162 lb (73.5 kg)  SpO2 93%   BMI 22.59 kg/m   BP Readings from Last 3 Encounters:  03/29/19 (!) 164/72  03/14/19 110/68  02/24/19 130/70    Wt Readings from Last 3 Encounters:  03/29/19 162 lb (73.5 kg)  03/14/19 163 lb (73.9 kg)  02/24/19 157 lb (71.2 kg)    Physical Exam Constitutional:      Appearance: Normal appearance.  HENT:     Nose: Nose normal.     Mouth/Throat:     Mouth: Mucous membranes are moist.  Eyes:     Conjunctiva/sclera: Conjunctivae normal.  Neck:     Musculoskeletal: Normal range of motion and neck supple.  Cardiovascular:     Rate and Rhythm: Normal rate and regular rhythm.     Pulses:          Dorsalis pedis pulses are 1+ on the right  side and 1+ on the left side.       Posterior tibial pulses are 1+ on the right side and 1+ on the left side.     Heart sounds: No murmur.  Pulmonary:     Breath sounds: No stridor. No wheezing, rhonchi or rales.  Abdominal:     General: Abdomen is flat.     Palpations: There is no mass.     Tenderness: There is no abdominal tenderness.  Musculoskeletal: Normal range of motion.     Right lower leg: No edema.     Left lower leg: No edema.     Right foot: Normal range of motion.     Left foot: Normal range of motion.  Feet:     Right foot:     Skin integrity: Blister present. No ulcer, skin breakdown, erythema or warmth.     Left foot:     Skin integrity: No ulcer, blister, skin breakdown, erythema or warmth.     Comments: See photo Lymphadenopathy:     Cervical: No cervical adenopathy.  Skin:    General: Skin is warm and dry.  Neurological:     Mental Status: He is alert.     Lab Results  Component Value Date   WBC 6.0 12/22/2018   HGB 13.7 12/22/2018   HCT 40.6 12/22/2018   PLT 193.0 12/22/2018   GLUCOSE 123 (H) 03/29/2019   CHOL 136 12/22/2018   TRIG 80.0 12/22/2018   HDL 47.60 12/22/2018   LDLCALC 72 12/22/2018   ALT 16 12/22/2018   AST 15 12/22/2018   NA 141 03/29/2019   K 4.2 03/29/2019   CL 107 03/29/2019   CREATININE 1.54 (H) 03/29/2019   BUN 32 (H) 03/29/2019   CO2 26 03/29/2019   TSH 3.50 05/24/2018   PSA 2.55 08/21/2015   HGBA1C 7.7 (H) 03/29/2019   MICROALBUR 1.8 05/24/2018    No results found.  Assessment & Plan:   Dale Anderson was seen today for hypertension and diabetes.  Diagnoses and all orders for this visit:  Essential hypertension-his blood pressure is adequately well controlled.  Renal function stable.  Electrolytes are normal. -     Basic metabolic panel; Future  Type II diabetes mellitus with manifestations (HCC)-his blood sugar is adequately well controlled. -     Basic metabolic panel; Future -     Hemoglobin A1c; Future  Stage 3a  chronic kidney disease -     Basic metabolic panel; Future  Diabetic polyneuropathy associated with type 2 diabetes mellitus ([place]) -     L-Methylfolate-Algae-B12-B6 (METANX) 3-90.314-2-35 MG CAPS; Take 1 capsule by mouth 2 (  two) times daily.  Friction blister of the foot, right, initial encounter- I put a large Band-Aid over the area and told him to keep this protected for the next 2 weeks.  If it becomes complicated with ulceration or infection he will let me know and we will consider treatment options.  Other orders -     Insulin Pen Needle 31G X 6 MM MISC; Use to inject insulin five times a day. Dx: E11.8   I have changed Dale Anderson. Dale Anderson's Insulin Pen Needle. I am also having him start on Metanx. Additionally, I am having him maintain his Vitamin D, blood glucose meter kit and supplies, Lancets, FreeStyle Lite, levocetirizine, ALPRAZolam, tamsulosin, chlorthalidone, pantoprazole, atorvastatin, Tresiba FlexTouch, telmisartan, FREESTYLE LITE, Multiple Vitamins-Minerals (CENTRUM SILVER PO), ASPIRIN 81 PO, HumaLOG KwikPen, and NONFORMULARY OR COMPOUNDED ITEM.  Meds ordered this encounter  Medications  . Insulin Pen Needle 31G X 6 MM MISC    Sig: Use to inject insulin five times a day. Dx: E11.8    Dispense:  130 each    Refill:  5    Please dispense Unifine Pen Tips 31GX6MM  . L-Methylfolate-Algae-B12-B6 (METANX) 3-90.314-2-35 MG CAPS    Sig: Take 1 capsule by mouth 2 (two) times daily.    Dispense:  180 capsule    Refill:  1     Follow-up: Return in about 6 months (around 09/27/2019).  Scarlette Calico, MD

## 2019-04-05 ENCOUNTER — Telehealth: Payer: Self-pay | Admitting: Neurology

## 2019-04-05 MED FILL — TRESIBA FLEXTOUCH 200 UNITS: 200 | 60 days supply | Qty: 6 | Fill #1

## 2019-04-05 NOTE — Telephone Encounter (Signed)
MRI lumbar spine without contrast 11/25/2018 Performed at [facility] orthopedic specialist: 1.  Severe bilateral foraminal stenosis at L5-S1, left greater than right.  The left L5 nerve is compressed and neural foramen. 2.  Multilevel degenerative disc disease throughout the remainder of the lumbar spine without focal neural impingement except for far lateral disc bulge and osteophyte at L2-3-4 with a slight mass-effect upon the right L3 nerve lateral to the neural foramen.

## 2019-04-06 MED FILL — ATORVASTATIN 20 MG TABLET: 20 | 90 days supply | Qty: 90 | Fill #1

## 2019-04-06 MED FILL — PANTOPRAZOLE SOD DR 40 MG T: 40 | 90 days supply | Qty: 90 | Fill #1

## 2019-04-14 ENCOUNTER — Ambulatory Visit: Payer: 59 | Admitting: Dietician

## 2019-05-02 ENCOUNTER — Ambulatory Visit: Payer: 59 | Admitting: Internal Medicine

## 2019-05-02 ENCOUNTER — Other Ambulatory Visit: Payer: Self-pay | Admitting: Internal Medicine

## 2019-05-04 MED FILL — UNIFINE PENTIPS 6MM 31G: 31G X 6 MM | 40 days supply | Qty: 200 | Fill #0

## 2019-05-05 ENCOUNTER — Ambulatory Visit (INDEPENDENT_AMBULATORY_CARE_PROVIDER_SITE_OTHER): Payer: PPO | Admitting: Internal Medicine

## 2019-05-05 ENCOUNTER — Encounter: Payer: Self-pay | Admitting: Internal Medicine

## 2019-05-05 ENCOUNTER — Other Ambulatory Visit: Payer: Self-pay

## 2019-05-05 VITALS — BP 138/68 | HR 53 | Temp 97.9°F | Ht 71.0 in | Wt 159.0 lb

## 2019-05-05 DIAGNOSIS — I10 Essential (primary) hypertension: Secondary | ICD-10-CM

## 2019-05-05 DIAGNOSIS — N1831 Chronic kidney disease, stage 3a: Secondary | ICD-10-CM

## 2019-05-05 DIAGNOSIS — E118 Type 2 diabetes mellitus with unspecified complications: Secondary | ICD-10-CM

## 2019-05-05 DIAGNOSIS — L219 Seborrheic dermatitis, unspecified: Secondary | ICD-10-CM | POA: Diagnosis not present

## 2019-05-05 MED ORDER — INSULIN PEN NEEDLE 31G X 6 MM MISC: 5 refills | Status: DC

## 2019-05-05 MED ORDER — FLUOCINONIDE 0.05 % EX SOLN: CUTANEOUS | 3 refills | Status: DC

## 2019-05-05 MED FILL — FLUOCINONIDE 0.05 % SOLN: 0.05 | 30 days supply | Qty: 60 | Fill #0

## 2019-05-05 MED FILL — TAMSULOSIN HCL 0.4 MG CAP: 0.4 | 90 days supply | Qty: 90 | Fill #1

## 2019-05-05 NOTE — Progress Notes (Signed)
Subjective:  Patient ID: Dale Anderson, male    DOB: 1930/04/18  Age: 85 y.o. MRN: [id number]  CC: Hypertension, Diabetes, and Rash   HPI Dale Anderson presents for f/up - He has a recurrent rash on his scalp.  He says when he uses a topical steroid the rash goes away.  He also wants me to check his blood pressure.  He said he ran out of chlorthalidone several months ago and has not been taking it but feels like his blood pressure has been well controlled.  Outpatient Medications Prior to Visit  Medication Sig Dispense Refill  . ALPRAZolam (XANAX) 0.5 MG tablet Take 1 tablet (0.5 mg total) by mouth daily as needed. for anxiety 30 tablet 3  . ASPIRIN 81 PO Take by mouth.    Marland Kitchen atorvastatin (LIPITOR) 20 MG tablet TAKE 1 TABLET BY MOUTH DAILY. 90 tablet 1  . blood glucose meter kit and supplies KIT Use to test blood sugar once daily. DX: E11.9 1 each 0  . Blood Glucose Monitoring Suppl (FREESTYLE LITE) DEVI   0  . Cholecalciferol (VITAMIN D) 1000 UNITS capsule Take 1,000 Units by mouth daily.      Marland Kitchen glucose blood (FREESTYLE LITE) test strip USE TO CHECK BLOOD SUGAR ONCE DAILY 100 strip 1  . Insulin Degludec (TRESIBA FLEXTOUCH) 200 UNIT/ML SOPN Inject 20 Units into the skin daily. 6 mL 1  . Insulin Lispro (HUMALOG KWIKPEN) 200 UNIT/ML SOPN Inject 5 Units into the skin 3 (three) times daily with meals. 9 mL 1  . L-Methylfolate-Algae-B12-B6 (METANX) 3-90.314-2-35 MG CAPS Take 1 capsule by mouth 2 (two) times daily. 180 capsule 1  . Lancets MISC Use to test blood sugar once daily. DX E11.09 100 each 3  . levocetirizine (XYZAL) 5 MG tablet Take 1 tablet (5 mg total) by mouth every evening. 90 tablet 1  . Multiple Vitamins-Minerals (CENTRUM SILVER PO) Take by mouth.    . NONFORMULARY OR COMPOUNDED ITEM Peripheral Neuropathy Cream: Bupivacaine 1%, Doxepin 3%, Gabapentin 6%, Pentoxifylline 3%, Topiramate 1% Order faxed to [place] 1 each 3  . pantoprazole (PROTONIX) 40 MG  tablet TAKE 1 TABLET BY MOUTH ONCE DAILY 90 tablet 1  . tamsulosin (FLOMAX) 0.4 MG CAPS capsule TAKE 1 CAPSULE BY MOUTH DAILY. 90 capsule 1  . telmisartan (MICARDIS) 40 MG tablet TAKE 1 TABLET BY MOUTH DAILY. 90 tablet 1  . Insulin Pen Needle 31G X 6 MM MISC Use to inject insulin five times a day. Dx: E11.8 130 each 5  . chlorthalidone (HYGROTON) 25 MG tablet TAKE 1 TABLET (25 MG TOTAL) BY MOUTH DAILY. 90 tablet 0   No facility-administered medications prior to visit.     ROS Review of Systems  Constitutional: Negative for diaphoresis and fatigue.  HENT: Negative.   Eyes: Negative for visual disturbance.  Respiratory: Negative for cough, chest tightness, shortness of breath and wheezing.   Cardiovascular: Negative for chest pain and leg swelling.  Gastrointestinal: Negative for abdominal pain, diarrhea, nausea and vomiting.  Endocrine: Negative.   Genitourinary: Negative.  Negative for difficulty urinating.  Musculoskeletal: Positive for back pain. Negative for arthralgias and myalgias.  Skin: Positive for rash. Negative for color change.  Neurological: Negative.  Negative for dizziness and weakness.  Hematological: Negative for adenopathy. Does not bruise/bleed easily.  Psychiatric/Behavioral: Negative.     Objective:  BP 138/68 (BP Location: Left Arm, Patient Position: Sitting, Cuff Size: Normal)   Pulse (!) 53   Temp 97.9 F (36.6  C) (Oral)   Ht _0  (1.803 m)   Wt 159 lb (72.1 kg)   SpO2 93%   BMI 22.18 kg/m   BP Readings from Last 3 Encounters:  05/05/19 138/68  03/29/19 (!) 164/72  03/14/19 110/68    Wt Readings from Last 3 Encounters:  05/05/19 159 lb (72.1 kg)  03/29/19 162 lb (73.5 kg)  03/14/19 163 lb (73.9 kg)    Physical Exam Vitals signs reviewed.  Constitutional:      Appearance: Normal appearance.  HENT:     Nose: Nose normal.  Eyes:     General: No scleral icterus.    Conjunctiva/sclera: Conjunctivae normal.  Neck:     Musculoskeletal: Neck  supple.  Cardiovascular:     Rate and Rhythm: Normal rate and regular rhythm.     Heart sounds: No murmur.  Pulmonary:     Effort: Pulmonary effort is normal.     Breath sounds: No stridor. No wheezing, rhonchi or rales.  Abdominal:     General: Abdomen is flat. There is no distension.     Palpations: There is no hepatomegaly, splenomegaly or mass.     Tenderness: There is no abdominal tenderness.  Musculoskeletal: Normal range of motion.     Right lower leg: No edema.  Lymphadenopathy:     Cervical: No cervical adenopathy.  Skin:    General: Skin is warm and dry.     Comments: Scattered areas on the top of the scalp with scaling and flaking.  Neurological:     Mental Status: He is alert.     Lab Results  Component Value Date   WBC 6.0 12/22/2018   HGB 13.7 12/22/2018   HCT 40.6 12/22/2018   PLT 193.0 12/22/2018   GLUCOSE 123 (H) 03/29/2019   CHOL 136 12/22/2018   TRIG 80.0 12/22/2018   HDL 47.60 12/22/2018   LDLCALC 72 12/22/2018   ALT 16 12/22/2018   AST 15 12/22/2018   NA 141 03/29/2019   K 4.2 03/29/2019   CL 107 03/29/2019   CREATININE 1.54 (H) 03/29/2019   BUN 32 (H) 03/29/2019   CO2 26 03/29/2019   TSH 3.50 05/24/2018   PSA 2.55 08/21/2015   HGBA1C 7.7 (H) 03/29/2019   MICROALBUR 1.8 05/24/2018    No results found.  Assessment & Plan:   Kani was seen today for hypertension, diabetes and rash.  Diagnoses and all orders for this visit:  Essential hypertension- His blood pressure is adequately well controlled without chlorthalidone.  I recommended that he not restart it.  Type II diabetes mellitus with manifestations ([place])- His blood sugar is adequately well controlled.  Stage 3a chronic kidney disease- He will avoid nephrotoxic agents.  His blood pressure and blood sugars are adequately well controlled.  Seborrheic dermatitis of scalp -     fluocinonide (LIDEX) 0.05 % external solution; APPLY TO THE AFFECTED AREA AS DIRECTED  Other orders -      Insulin Pen Needle 31G X 6 MM MISC; Use to inject insulin five times a day. Dx: E11.8   I have discontinued [name]. Dust's chlorthalidone. I am also having him maintain his Vitamin D, blood glucose meter kit and supplies, Lancets, FreeStyle Lite, levocetirizine, ALPRAZolam, tamsulosin, pantoprazole, atorvastatin, Tresiba FlexTouch, telmisartan, FREESTYLE LITE, Multiple Vitamins-Minerals (CENTRUM SILVER PO), ASPIRIN 81 PO, HumaLOG KwikPen, NONFORMULARY OR COMPOUNDED ITEM, Metanx, Insulin Pen Needle, and fluocinonide.  Meds ordered this encounter  Medications  . Insulin Pen Needle 31G X 6 MM MISC  Sig: Use to inject insulin five times a day. Dx: E11.8    Dispense:  130 each    Refill:  5    Please dispense Unifine Pen Tips 31GX6MM. Discontinue all previous refills for this medication. Hold/Fill when appropriate according to pt rx fill schedule.  . fluocinonide (LIDEX) 0.05 % external solution    Sig: APPLY TO THE AFFECTED AREA AS DIRECTED    Dispense:  60 mL    Refill:  3     Follow-up: No follow-ups on file.  Scarlette Calico, MD

## 2019-05-05 NOTE — Patient Instructions (Signed)

## 2019-05-06 MED FILL — L-METHYLFOLATE-ALGAE-B12-B6: 3-90.314-2- | 30 days supply | Qty: 60 | Fill #1

## 2019-05-16 MED FILL — TELMISARTAN 40 MG TABLET: 40 | 90 days supply | Qty: 90 | Fill #1

## 2019-06-09 MED FILL — L-METHYLFOLATE-ALGAE-B12-B6: 3-90.314-2- | 30 days supply | Qty: 60 | Fill #2

## 2019-06-09 MED FILL — UNIFINE PENTIPS 6MM 31G: 31G X 6 MM | 40 days supply | Qty: 200 | Fill #1

## 2019-06-10 ENCOUNTER — Other Ambulatory Visit: Payer: Self-pay | Admitting: Internal Medicine

## 2019-06-10 DIAGNOSIS — E118 Type 2 diabetes mellitus with unspecified complications: Secondary | ICD-10-CM

## 2019-06-13 MED FILL — TRESIBA FLEXTOUCH 200 UNITS: 200 | 60 days supply | Qty: 6 | Fill #0

## 2019-06-21 ENCOUNTER — Other Ambulatory Visit: Payer: Self-pay

## 2019-06-21 ENCOUNTER — Ambulatory Visit (INDEPENDENT_AMBULATORY_CARE_PROVIDER_SITE_OTHER): Payer: 59 | Admitting: Podiatry

## 2019-06-21 DIAGNOSIS — M2042 Other hammer toe(s) (acquired), left foot: Secondary | ICD-10-CM

## 2019-06-21 DIAGNOSIS — E1149 Type 2 diabetes mellitus with other diabetic neurological complication: Secondary | ICD-10-CM

## 2019-06-21 DIAGNOSIS — M2041 Other hammer toe(s) (acquired), right foot: Secondary | ICD-10-CM

## 2019-06-21 NOTE — Progress Notes (Signed)
Subjective: 85 year old male presents the office today for evaluation neuropathy, lumbosacral radiculopathy.  He states that he feels fine and his only concern is he gets tightness to the toes.  He does walk quite a bit.  He does not wake him up at night.  No radiating pain or weakness.  No recent falls.  Denies any systemic complaints such as fevers, chills, nausea, vomiting. No acute ch any medic anges since last appointment, and no other complaints at this time.   Last A1c 7.7 on 03/29/2019  MRI lumbar spine without contrast 11/25/2018 Performed at [facility] orthopedic specialist: 1.  Severe bilateral foraminal stenosis at L5-S1, left greater than right.  The left L5 nerve is compressed and neural foramen. 2.  Multilevel degenerative disc disease throughout the remainder of the lumbar spine without focal neural impingement except for far lateral disc bulge and osteophyte at L2-3-4 with a slight mass-effect upon the right L3 nerve lateral to the neural foramen.  Objective: AAO x3, NAD DP/PT pulses palpable bilaterally, CRT less than 3 seconds Overall sensation intact except for 2 areas from the plantar digits bilaterally.  There is no area of discomfort.  Semirigid hammertoe contractures present.  No area tenderness.  No edema, erythema. No open lesions or pre-ulcerative lesions.  No open lesions. No pain with calf compression, swelling, warmth, erythema  Assessment: 85 year old male diabetic neuropathy/lumbosacral radiculopathy  Plan: -All treatment options discussed with the patient including all alternatives, risks, complications.  -he is doing well doing well.  However new medications and recommended follow-up with Dr. Posey Pronto with neurology. -Discussed toe exercises to help strengthen the toes as well as dispensed a toe crest. -Discussed daily foot inspection  Return in about 6 months (around 12/20/2019).  Trula Slade DPM

## 2019-06-27 ENCOUNTER — Other Ambulatory Visit: Payer: Self-pay | Admitting: Internal Medicine

## 2019-06-27 DIAGNOSIS — F419 Anxiety disorder, unspecified: Secondary | ICD-10-CM

## 2019-06-27 MED FILL — ALPRAZolam 0.5 MG TABS: 0.5 | 30 days supply | Qty: 30 | Fill #0

## 2019-07-04 ENCOUNTER — Other Ambulatory Visit: Payer: Self-pay | Admitting: Internal Medicine

## 2019-07-04 DIAGNOSIS — E785 Hyperlipidemia, unspecified: Secondary | ICD-10-CM

## 2019-07-04 MED FILL — L-METHYLFOLATE-ALGAE-B12-B6: 3-90.314-2- | 30 days supply | Qty: 60 | Fill #3

## 2019-07-04 MED FILL — ATORVASTATIN CALCIUM 20 MG: 20 | 90 days supply | Qty: 90 | Fill #0

## 2019-07-04 MED FILL — PANTOPRAZOLE SOD DR 40 MG T: 40 | 90 days supply | Qty: 90 | Fill #0

## 2019-07-05 DIAGNOSIS — N401 Enlarged prostate with lower urinary tract symptoms: Secondary | ICD-10-CM | POA: Diagnosis not present

## 2019-07-05 DIAGNOSIS — R350 Frequency of micturition: Secondary | ICD-10-CM | POA: Diagnosis not present

## 2019-07-05 DIAGNOSIS — Z125 Encounter for screening for malignant neoplasm of prostate: Secondary | ICD-10-CM | POA: Diagnosis not present

## 2019-07-14 MED FILL — HUMALOG 200 UNITS/ML KWIKPE: 200 | 80 days supply | Qty: 6 | Fill #1

## 2019-07-15 DIAGNOSIS — E1122 Type 2 diabetes mellitus with diabetic chronic kidney disease: Secondary | ICD-10-CM | POA: Diagnosis not present

## 2019-07-15 DIAGNOSIS — E559 Vitamin D deficiency, unspecified: Secondary | ICD-10-CM | POA: Diagnosis not present

## 2019-07-15 DIAGNOSIS — N1831 Chronic kidney disease, stage 3a: Secondary | ICD-10-CM | POA: Diagnosis not present

## 2019-07-15 DIAGNOSIS — I129 Hypertensive chronic kidney disease with stage 1 through stage 4 chronic kidney disease, or unspecified chronic kidney disease: Secondary | ICD-10-CM | POA: Diagnosis not present

## 2019-07-18 MED FILL — TAMSULOSIN HCL 0.4 MG CAP: 0.4 | 90 days supply | Qty: 90 | Fill #0

## 2019-07-21 ENCOUNTER — Other Ambulatory Visit: Payer: Self-pay | Admitting: Nephrology

## 2019-07-21 DIAGNOSIS — N1831 Chronic kidney disease, stage 3a: Secondary | ICD-10-CM

## 2019-07-25 ENCOUNTER — Ambulatory Visit
Admission: RE | Admit: 2019-07-25 | Discharge: 2019-07-25 | Disposition: A | Discharge status: Home / Self Care | Payer: PPO | Source: Ambulatory Visit | Attending: Nephrology | Admitting: Nephrology

## 2019-07-25 DIAGNOSIS — N281 Cyst of kidney, acquired: Secondary | ICD-10-CM | POA: Diagnosis not present

## 2019-07-25 DIAGNOSIS — N1831 Chronic kidney disease, stage 3a: Secondary | ICD-10-CM

## 2019-07-25 MED FILL — L-METHYLFOLATE-ALGAE-B12-B6: 3-90.314-2- | 30 days supply | Qty: 60 | Fill #4

## 2019-09-02 ENCOUNTER — Other Ambulatory Visit: Payer: Self-pay | Admitting: Internal Medicine

## 2019-09-02 DIAGNOSIS — I10 Essential (primary) hypertension: Secondary | ICD-10-CM

## 2019-09-02 DIAGNOSIS — E118 Type 2 diabetes mellitus with unspecified complications: Secondary | ICD-10-CM

## 2019-09-02 MED FILL — TRESIBA FLEXTOUCH 200 UNITS: 200 | 60 days supply | Qty: 6 | Fill #1

## 2019-09-02 MED FILL — TELMISARTAN 40 MG TABLET: 40 | 90 days supply | Qty: 90 | Fill #0

## 2019-09-05 ENCOUNTER — Ambulatory Visit: Payer: PPO | Admitting: Internal Medicine

## 2019-09-08 ENCOUNTER — Other Ambulatory Visit: Payer: PPO

## 2019-09-14 ENCOUNTER — Ambulatory Visit: Payer: PPO | Admitting: Internal Medicine

## 2019-09-22 ENCOUNTER — Encounter: Payer: Self-pay | Admitting: Internal Medicine

## 2019-09-22 ENCOUNTER — Ambulatory Visit (INDEPENDENT_AMBULATORY_CARE_PROVIDER_SITE_OTHER): Payer: 59 | Admitting: Internal Medicine

## 2019-09-22 ENCOUNTER — Other Ambulatory Visit: Payer: Self-pay

## 2019-09-22 VITALS — BP 140/80 | HR 65 | Temp 97.5°F | Ht 71.0 in | Wt 163.0 lb

## 2019-09-22 DIAGNOSIS — E118 Type 2 diabetes mellitus with unspecified complications: Secondary | ICD-10-CM

## 2019-09-22 DIAGNOSIS — N1831 Chronic kidney disease, stage 3a: Secondary | ICD-10-CM | POA: Diagnosis not present

## 2019-09-22 DIAGNOSIS — I10 Essential (primary) hypertension: Secondary | ICD-10-CM

## 2019-09-22 LAB — BASIC METABOLIC PANEL
BUN: 32 mg/dL — ABNORMAL HIGH (ref 6–23)
CO2: 29 mEq/L (ref 19–32)
Calcium: 9.8 mg/dL (ref 8.4–10.5)
Chloride: 105 mEq/L (ref 96–112)
Creatinine, Ser: 1.51 mg/dL — ABNORMAL HIGH (ref 0.40–1.50)
GFR: 53.11 mL/min — ABNORMAL LOW (ref >60.00)
Glucose, Bld: 138 mg/dL — ABNORMAL HIGH (ref 70–99)
Potassium: 4.7 mEq/L (ref 3.5–5.1)
Sodium: 139 mEq/L (ref 135–145)

## 2019-09-22 LAB — URINALYSIS, ROUTINE W REFLEX MICROSCOPIC
Bilirubin Urine: NEGATIVE
Hgb urine dipstick: NEGATIVE
Ketones, ur: NEGATIVE
Leukocytes,Ua: NEGATIVE
Nitrite: NEGATIVE
RBC / HPF: NONE SEEN (ref 0–?)
Specific Gravity, Urine: 1.025 (ref 1.000–1.030)
Urine Glucose: NEGATIVE
Urobilinogen, UA: 0.2 (ref 0.0–1.0)
pH: 5.5 (ref 5.0–8.0)

## 2019-09-22 LAB — MICROALBUMIN / CREATININE URINE RATIO
Creatinine,U: 130.1 mg/dL
Microalb Creat Ratio: 9.6 mg/g (ref 0.0–30.0)
Microalb, Ur: 12.5 mg/dL — ABNORMAL HIGH (ref 0.0–1.9)

## 2019-09-22 LAB — HEMOGLOBIN A1C: Hgb A1c MFr Bld: 7.7 % — ABNORMAL HIGH (ref 4.6–6.5)

## 2019-09-22 NOTE — Progress Notes (Signed)
Subjective:  Patient ID: Dale Anderson, male    DOB: 1929/12/26  Age: 86 y.o. MRN: [id number]  CC: Hypertension and Diabetes  This visit occurred during the SARS-CoV-2 public health emergency.  Safety protocols were in place, including screening questions prior to the visit, additional usage of staff PPE, and extensive cleaning of exam room while observing appropriate contact time as indicated for disinfecting solutions.    HPI Dale Anderson presents for f/up - He tells me his blood sugar and blood pressures have been well controlled recently.  He denies any recent episodes of chest pain, shortness of breath, diaphoresis, or polys.  Outpatient Medications Prior to Visit  Medication Sig Dispense Refill  . ALPRAZolam (XANAX) 0.5 MG tablet TAKE 1 TABLET BY MOUTH DAILY AS NEEDED FOR ANXIETY 30 tablet 3  . ASPIRIN 81 PO Take by mouth.    Marland Kitchen atorvastatin (LIPITOR) 20 MG tablet TAKE 1 TABLET BY MOUTH DAILY. 90 tablet 1  . blood glucose meter kit and supplies KIT Use to test blood sugar once daily. DX: E11.9 1 each 0  . Blood Glucose Monitoring Suppl (FREESTYLE LITE) DEVI   0  . Cholecalciferol (VITAMIN D) 1000 UNITS capsule Take 1,000 Units by mouth daily.      . fluocinonide (LIDEX) 0.05 % external solution APPLY TO THE AFFECTED AREA AS DIRECTED 60 mL 3  . glucose blood (FREESTYLE LITE) test strip USE TO CHECK BLOOD SUGAR ONCE DAILY 100 strip 1  . Insulin Lispro (HUMALOG KWIKPEN) 200 UNIT/ML SOPN Inject 5 Units into the skin 3 (three) times daily with meals. 9 mL 1  . Insulin Pen Needle 31G X 6 MM MISC Use to inject insulin five times a day. Dx: E11.8 130 each 5  . L-Methylfolate-Algae-B12-B6 (METANX) 3-90.314-2-35 MG CAPS Take 1 capsule by mouth 2 (two) times daily. 180 capsule 1  . Lancets MISC Use to test blood sugar once daily. DX E11.09 100 each 3  . levocetirizine (XYZAL) 5 MG tablet Take 1 tablet (5 mg total) by mouth every evening. 90 tablet 1  . Multiple Vitamins-Minerals  (CENTRUM SILVER PO) Take by mouth.    . NONFORMULARY OR COMPOUNDED ITEM Peripheral Neuropathy Cream: Bupivacaine 1%, Doxepin 3%, Gabapentin 6%, Pentoxifylline 3%, Topiramate 1% Order faxed to [place] 1 each 3  . pantoprazole (PROTONIX) 40 MG tablet TAKE 1 TABLET BY MOUTH ONCE DAILY 90 tablet 1  . tamsulosin (FLOMAX) 0.4 MG CAPS capsule TAKE 1 CAPSULE BY MOUTH DAILY. 90 capsule 1  . telmisartan (MICARDIS) 40 MG tablet TAKE 1 TABLET BY MOUTH DAILY. 90 tablet 1  . TRESIBA FLEXTOUCH 200 UNIT/ML SOPN INJECT 20 UNITS INTO THE SKIN DAILY. 6 mL 1   No facility-administered medications prior to visit.    ROS Review of Systems  Constitutional: Negative for diaphoresis and fatigue.  HENT: Negative.   Eyes: Negative for visual disturbance.  Respiratory: Negative for cough, chest tightness, shortness of breath and wheezing.   Cardiovascular: Negative for chest pain, palpitations and leg swelling.  Gastrointestinal: Negative for abdominal pain, constipation, diarrhea, nausea and vomiting.  Endocrine: Negative for polydipsia, polyphagia and polyuria.  Genitourinary: Negative.  Negative for difficulty urinating.  Musculoskeletal: Negative.  Negative for arthralgias and myalgias.  Skin: Negative.   Neurological: Negative.  Negative for dizziness, weakness, light-headedness and headaches.  Hematological: Negative for adenopathy. Does not bruise/bleed easily.  Psychiatric/Behavioral: Negative.     Objective:  BP 140/80 (BP Location: Left Arm, Patient Position: Sitting, Cuff Size: Large)  Pulse 65   Temp (!) 97.5 F (36.4 C) (Oral)   Ht '5\' 11"'  (1.803 m)   Wt 163 lb (73.9 kg)   SpO2 94%   BMI 22.73 kg/m   BP Readings from Last 3 Encounters:  09/22/19 140/80  05/05/19 138/68  03/29/19 (!) 164/72    Wt Readings from Last 3 Encounters:  09/22/19 163 lb (73.9 kg)  05/05/19 159 lb (72.1 kg)  03/29/19 162 lb (73.5 kg)    Physical Exam Vitals reviewed.  Constitutional:       Appearance: Normal appearance.  HENT:     Nose: Nose normal.     Mouth/Throat:     Mouth: Mucous membranes are moist.  Eyes:     General: No scleral icterus.    Conjunctiva/sclera: Conjunctivae normal.  Cardiovascular:     Rate and Rhythm: Normal rate and regular rhythm.     Heart sounds: No murmur.  Pulmonary:     Effort: Pulmonary effort is normal.     Breath sounds: No stridor. No wheezing, rhonchi or rales.  Abdominal:     General: Bowel sounds are normal. There is no distension.     Palpations: Abdomen is soft. There is no hepatomegaly, splenomegaly or mass.     Tenderness: There is no abdominal tenderness.  Musculoskeletal:        General: Normal range of motion.     Cervical back: Neck supple.     Right lower leg: No edema.     Left lower leg: No edema.  Lymphadenopathy:     Cervical: No cervical adenopathy.  Skin:    General: Skin is warm and dry.  Neurological:     General: No focal deficit present.     Mental Status: He is alert and oriented to person, place, and time. Mental status is at baseline.     Lab Results  Component Value Date   WBC 6.0 12/22/2018   HGB 13.7 12/22/2018   HCT 40.6 12/22/2018   PLT 193.0 12/22/2018   GLUCOSE 138 (H) 09/22/2019   CHOL 136 12/22/2018   TRIG 80.0 12/22/2018   HDL 47.60 12/22/2018   LDLCALC 72 12/22/2018   ALT 16 12/22/2018   AST 15 12/22/2018   NA 139 09/22/2019   K 4.7 09/22/2019   CL 105 09/22/2019   CREATININE 1.51 (H) 09/22/2019   BUN 32 (H) 09/22/2019   CO2 29 09/22/2019   TSH 3.50 05/24/2018   PSA 2.55 08/21/2015   HGBA1C 7.7 (H) 09/22/2019   MICROALBUR 12.5 (H) 09/22/2019    US RENAL  Result Date: 07/26/2019 CLINICAL DATA:  Chronic kidney disease, stage III A EXAM: RENAL / URINARY TRACT ULTRASOUND COMPLETE COMPARISON:  None. FINDINGS: Right Kidney: Renal measurements: 10.0 x 4.3 x 4.8 cm = volume: 109 mL . Echogenicity within normal limits. No solid mass or hydronephrosis visualized. There are 3 cysts in  the right kidney. The largest is on the upper pole measuring 3 cm. Left Kidney: Renal measurements: 10.4 x 4.7 x 4.5 cm = volume: 116. mL. Echogenicity within normal limits. No mass or hydronephrosis visualized. Bladder: Appears normal for degree of bladder distention. Bilateral ureteral jets identified. Other: Prostate gland measures 4.5 x 3.8 x 5.1 cm with a volume of 46 cubic cm. IMPRESSION: No significant abnormality of the kidneys or bladder. Three benign-appearing cysts on the right kidney. Electronically Signed   By: Lorriane Shire M.D.   On: 07/26/2019 08:06    Assessment & Plan:   Leny was  seen today for hypertension and diabetes.  Diagnoses and all orders for this visit:  Essential hypertension- His blood pressure is well controlled.  Electrolytes are normal and his renal function is stable. -     Basic metabolic panel; Future -     Basic metabolic panel  Stage 3a chronic kidney disease- His renal function is stable.  He will continue to avoid nephrotoxic agents.  I will continue to get good control of his blood pressure and his blood sugars. -     Basic metabolic panel; Future -     Microalbumin / creatinine urine ratio; Future -     Urinalysis, Routine w reflex microscopic; Future -     Urinalysis, Routine w reflex microscopic -     Microalbumin / creatinine urine ratio -     Basic metabolic panel  Type II diabetes mellitus with manifestations ([place])- His A1c is at 7.7%.  He has achieved adequate glycemic. -     Microalbumin / creatinine urine ratio; Future -     Hemoglobin A1c; Future -     HM Diabetes Foot Exam -     Hemoglobin A1c -     Microalbumin / creatinine urine ratio   I am having Kjuan G. Pangelinan maintain his Vitamin D, blood glucose meter kit and supplies, Lancets, FreeStyle Lite, levocetirizine, tamsulosin, FREESTYLE LITE, Multiple Vitamins-Minerals (CENTRUM SILVER PO), ASPIRIN 81 PO, HumaLOG KwikPen, NONFORMULARY OR COMPOUNDED ITEM, Metanx, Insulin Pen  Needle, fluocinonide, Tresiba FlexTouch, ALPRAZolam, atorvastatin, pantoprazole, and telmisartan.  No orders of the defined types were placed in this encounter.    Follow-up: Return in about 6 months (around 03/23/2020).  Scarlette Calico, MD

## 2019-09-22 NOTE — Patient Instructions (Signed)
Type 2 Diabetes Mellitus, Diagnosis, Adult Type 2 diabetes (type 2 diabetes mellitus) is a long-term (chronic) disease. In type 2 diabetes, one or both of these problems may be present:  The pancreas does not make enough of a hormone called insulin.  Cells in the body do not respond properly to insulin that the body makes (insulin resistance). Normally, insulin allows blood sugar (glucose) to enter cells in the body. The cells use glucose for energy. Insulin resistance or lack of insulin causes excess glucose to build up in the blood instead of going into cells. As a result, high blood glucose (hyperglycemia) develops. What increases the risk? The following factors may make you more likely to develop type 2 diabetes:  Having a family member with type 2 diabetes.  Being overweight or obese.  Having an inactive (sedentary) lifestyle.  Having been diagnosed with insulin resistance.  Having a history of prediabetes, gestational diabetes, or polycystic ovary syndrome (PCOS).  Being of American-Indian, African-American, Hispanic/Latino, or Asian/Pacific Islander descent. What are the signs or symptoms? In the early stage of this condition, you may not have symptoms. Symptoms develop slowly and may include:  Increased thirst (polydipsia).  Increased hunger(polyphagia).  Increased urination (polyuria).  Increased urination during the night (nocturia).  Unexplained weight loss.  Frequent infections that keep coming back (recurring).  Fatigue.  Weakness.  Vision changes, such as blurry vision.  Cuts or bruises that are slow to heal.  Tingling or numbness in the hands or feet.  Dark patches on the skin (acanthosis nigricans). How is this diagnosed? This condition is diagnosed based on your symptoms, your medical history, a physical exam, and your blood glucose level. Your blood glucose may be checked with one or more of the following blood tests:  A fasting blood glucose (FBG)  test. You will not be allowed to eat (you will fast) for 8 hours or longer before a blood sample is taken.  A random blood glucose test. This test checks blood glucose at any time of day regardless of when you ate.  An A1c (hemoglobin A1c) blood test. This test provides information about blood glucose control over the previous 2-3 months.  An oral glucose tolerance test (OGTT). This test measures your blood glucose at two times: ? After fasting. This is your baseline blood glucose level. ? Two hours after drinking a beverage that contains glucose. You may be diagnosed with type 2 diabetes if:  Your FBG level is 126 mg/dL (7.0 mmol/L) or higher.  Your random blood glucose level is 200 mg/dL (11.1 mmol/L) or higher.  Your A1c level is 6.5% or higher.  Your OGTT result is higher than 200 mg/dL (11.1 mmol/L). These blood tests may be repeated to confirm your diagnosis. How is this treated? Your treatment may be managed by a specialist called an endocrinologist. Type 2 diabetes may be treated by following instructions from your health care provider about:  Making diet and lifestyle changes. This may include: ? Following an individualized nutrition plan that is developed by a diet and nutrition specialist (registered dietitian). ? Exercising regularly. ? Finding ways to manage stress.  Checking your blood glucose level as often as told.  Taking diabetes medicines or insulin daily. This helps to keep your blood glucose levels in the healthy range. ? If you use insulin, you may need to adjust the dosage depending on how physically active you are and what foods you eat. Your health care provider will tell you how to adjust your dosage.    Taking medicines to help prevent complications from diabetes, such as: ? Aspirin. ? Medicine to lower cholesterol. ? Medicine to control blood pressure. Your health care provider will set individualized treatment goals for you. Your goals will be based on  your age, other medical conditions you have, and how you respond to diabetes treatment. Generally, the goal of treatment is to maintain the following blood glucose levels:  Before meals (preprandial): 80-130 mg/dL (4.4-7.2 mmol/L).  After meals (postprandial): below 180 mg/dL (10 mmol/L).  A1c level: less than 7%. Follow these instructions at home: Questions to ask your health care provider  Consider asking the following questions: ? Do I need to meet with a diabetes educator? ? Where can I find a support group for people with diabetes? ? What equipment will I need to manage my diabetes at home? ? What diabetes medicines do I need, and when should I take them? ? How often do I need to check my blood glucose? ? What number can I call if I have questions? ? When is my next appointment? General instructions  Take over-the-counter and prescription medicines only as told by your health care provider.  Keep all follow-up visits as told by your health care provider. This is important.  For more information about diabetes, visit: ? American Diabetes Association (ADA): www.diabetes.org ? American Association of Diabetes Educators (AADE): www.diabeteseducator.org Contact a health care provider if:  Your blood glucose is at or above 240 mg/dL (13.3 mmol/L) for 2 days in a row.  You have been sick or have had a fever for 2 days or longer, and you are not getting better.  You have any of the following problems for more than 6 hours: ? You cannot eat or drink. ? You have nausea and vomiting. ? You have diarrhea. Get help right away if:  Your blood glucose is lower than 54 mg/dL (3.0 mmol/L).  You become confused or you have trouble thinking clearly.  You have difficulty breathing.  You have moderate or large ketone levels in your urine. Summary  Type 2 diabetes (type 2 diabetes mellitus) is a long-term (chronic) disease. In type 2 diabetes, the pancreas does not make enough of a  hormone called insulin, or cells in the body do not respond properly to insulin that the body makes (insulin resistance).  This condition is treated by making diet and lifestyle changes and taking diabetes medicines or insulin.  Your health care provider will set individualized treatment goals for you. Your goals will be based on your age, other medical conditions you have, and how you respond to diabetes treatment.  Keep all follow-up visits as told by your health care provider. This is important. This information is not intended to replace advice given to you by your health care provider. Make sure you discuss any questions you have with your health care provider. Document Revised: 08/07/2017 Document Reviewed: 07/13/2015 Elsevier Patient Education  2020 Elsevier Inc.  

## 2019-09-26 MED FILL — ALPRAZolam 0.5 MG TABS: 0.5 | 30 days supply | Qty: 30 | Fill #1

## 2019-09-29 ENCOUNTER — Telehealth: Payer: Self-pay | Admitting: Internal Medicine

## 2019-09-29 NOTE — Telephone Encounter (Signed)
Tried to call pt x 2 - line rang and the clicked and then hung up.   PCP sent my chart message stating the following: (OK to read to patient)  Ado -  Your blood sugar is adequately well controlled. Your kidney function is stable. I don't see any new concerns here.  TJ  This MyChart message has not been read

## 2019-09-29 NOTE — Telephone Encounter (Signed)
Patient called to ask about his lab results, said that you could leave a detailed message on his home phone. [phone]

## 2019-09-30 MED FILL — L-METHYLFOLATE-ALGAE-B12-B6: 3-90.314-2- | 30 days supply | Qty: 60 | Fill #5

## 2019-10-04 NOTE — Telephone Encounter (Signed)
Tried x2 and line does not fully connect.

## 2019-10-05 MED FILL — PANTOPRAZOLE SOD DR 40 MG T: 40 | 90 days supply | Qty: 90 | Fill #1

## 2019-10-05 MED FILL — ATORVASTATIN 20 MG TABLET: 20 | 90 days supply | Qty: 90 | Fill #1

## 2019-10-11 MED FILL — UNIFINE PENTIPS 6MM 31G: 31G X 6 MM | 40 days supply | Qty: 200 | Fill #2

## 2019-11-03 DIAGNOSIS — N1831 Chronic kidney disease, stage 3a: Secondary | ICD-10-CM | POA: Diagnosis not present

## 2019-11-08 DIAGNOSIS — E1122 Type 2 diabetes mellitus with diabetic chronic kidney disease: Secondary | ICD-10-CM | POA: Diagnosis not present

## 2019-11-08 DIAGNOSIS — I129 Hypertensive chronic kidney disease with stage 1 through stage 4 chronic kidney disease, or unspecified chronic kidney disease: Secondary | ICD-10-CM | POA: Diagnosis not present

## 2019-11-08 DIAGNOSIS — N1831 Chronic kidney disease, stage 3a: Secondary | ICD-10-CM | POA: Diagnosis not present

## 2019-11-08 DIAGNOSIS — E559 Vitamin D deficiency, unspecified: Secondary | ICD-10-CM | POA: Diagnosis not present

## 2019-11-11 ENCOUNTER — Other Ambulatory Visit: Payer: Self-pay | Admitting: Internal Medicine

## 2019-11-11 DIAGNOSIS — E1142 Type 2 diabetes mellitus with diabetic polyneuropathy: Secondary | ICD-10-CM

## 2019-11-11 MED FILL — L-METHYLFOLATE-ALGAE-B12-B6: 3-90.314-2- | 30 days supply | Qty: 60 | Fill #0

## 2019-11-25 MED FILL — TELMISARTAN 40 MG TABLET: 40 | 90 days supply | Qty: 90 | Fill #1

## 2019-11-29 ENCOUNTER — Other Ambulatory Visit: Payer: Self-pay | Admitting: Internal Medicine

## 2019-11-29 DIAGNOSIS — E118 Type 2 diabetes mellitus with unspecified complications: Secondary | ICD-10-CM

## 2019-11-30 ENCOUNTER — Telehealth: Payer: Self-pay | Admitting: Internal Medicine

## 2019-11-30 NOTE — Telephone Encounter (Signed)
New Message:   Pt's wife is calling and states the pt has a knot in his rectum. She states she doesn't believe Dr. Ronnald Ramp would want to look at it so can he be referred to somewhere else to have it looked at. Please advise.

## 2019-12-01 NOTE — Telephone Encounter (Signed)
Can you call pt or pt spouse and let them know that he would need to be seen prior to doing a referral. We can see him on Monday.

## 2019-12-01 NOTE — Telephone Encounter (Signed)
LVM on cell number to return call to schedule an appointment before referral can be made.

## 2019-12-05 ENCOUNTER — Other Ambulatory Visit: Payer: Self-pay

## 2019-12-05 ENCOUNTER — Ambulatory Visit (INDEPENDENT_AMBULATORY_CARE_PROVIDER_SITE_OTHER): Payer: 59 | Admitting: Internal Medicine

## 2019-12-05 ENCOUNTER — Encounter: Payer: Self-pay | Admitting: Internal Medicine

## 2019-12-05 VITALS — BP 176/76 | HR 83 | Temp 97.8°F | Resp 16 | Ht 71.0 in | Wt 164.0 lb

## 2019-12-05 DIAGNOSIS — K648 Other hemorrhoids: Secondary | ICD-10-CM | POA: Diagnosis not present

## 2019-12-05 DIAGNOSIS — B356 Tinea cruris: Secondary | ICD-10-CM

## 2019-12-05 MED ORDER — CICLOPIROX 0.77 % EX GEL: 1.0000 | Freq: Two times a day (BID) | CUTANEOUS | 2 refills | Status: DC

## 2019-12-05 MED FILL — CICLOPIROX 0.77% GEL: 0.77 | 30 days supply | Qty: 100 | Fill #0

## 2019-12-05 NOTE — Patient Instructions (Signed)

## 2019-12-05 NOTE — Progress Notes (Addendum)
Subjective:  Patient ID: Dale Anderson, male    DOB: 07-23-1929  Age: 86 y.o. MRN: [id number]  CC: Rash  This visit occurred during the SARS-CoV-2 public health emergency.  Safety protocols were in place, including screening questions prior to the visit, additional usage of staff PPE, and extensive cleaning of exam room while observing appropriate contact time as indicated for disinfecting solutions.    HPI Dale Anderson presents for f/up - He complains of a rash in his groin for several weeks. It does not bother him. He has not treated it. He also complains of a 1 week hx of rectal discomfort with BRBPR.  Outpatient Medications Prior to Visit  Medication Sig Dispense Refill   ALPRAZolam (XANAX) 0.5 MG tablet TAKE 1 TABLET BY MOUTH DAILY AS NEEDED FOR ANXIETY 30 tablet 3   ASPIRIN 81 PO Take by mouth.     atorvastatin (LIPITOR) 20 MG tablet TAKE 1 TABLET BY MOUTH DAILY. 90 tablet 1   blood glucose meter kit and supplies KIT Use to test blood sugar once daily. DX: E11.9 1 each 0   Blood Glucose Monitoring Suppl (FREESTYLE LITE) DEVI   0   Cholecalciferol (VITAMIN D) 1000 UNITS capsule Take 1,000 Units by mouth daily.       fluocinonide (LIDEX) 0.05 % external solution APPLY TO THE AFFECTED AREA AS DIRECTED 60 mL 3   glucose blood (FREESTYLE LITE) test strip USE TO CHECK BLOOD SUGAR ONCE DAILY 100 strip 1   Insulin Lispro (HUMALOG KWIKPEN) 200 UNIT/ML SOPN Inject 5 Units into the skin 3 (three) times daily with meals. 9 mL 1   Insulin Pen Needle 31G X 6 MM MISC Use to inject insulin five times a day. Dx: E11.8 130 each 5   L-Methylfolate-Algae-B12-B6 3-90.314-2-35 MG CAPS TAKE 1 CAPSULE BY MOUTH 2 (TWO) TIMES DAILY. 180 capsule 1   Lancets MISC Use to test blood sugar once daily. DX E11.09 100 each 3   levocetirizine (XYZAL) 5 MG tablet Take 1 tablet (5 mg total) by mouth every evening. 90 tablet 1   Multiple Vitamins-Minerals (CENTRUM SILVER PO) Take by mouth.       NONFORMULARY OR COMPOUNDED ITEM Peripheral Neuropathy Cream: Bupivacaine 1%, Doxepin 3%, Gabapentin 6%, Pentoxifylline 3%, Topiramate 1% Order faxed to [place] 1 each 3   pantoprazole (PROTONIX) 40 MG tablet TAKE 1 TABLET BY MOUTH ONCE DAILY 90 tablet 1   tamsulosin (FLOMAX) 0.4 MG CAPS capsule TAKE 1 CAPSULE BY MOUTH DAILY. 90 capsule 1   telmisartan (MICARDIS) 40 MG tablet TAKE 1 TABLET BY MOUTH DAILY. 90 tablet 1   TRESIBA FLEXTOUCH 200 UNIT/ML FlexTouch Pen INJECT 20 UNITS INTO THE SKIN DAILY. 6 mL 1   No facility-administered medications prior to visit.    ROS Review of Systems  Constitutional: Negative.  Negative for chills, fatigue and fever.  HENT: Negative.   Eyes: Negative.   Respiratory: Negative.  Negative for cough, chest tightness, shortness of breath and wheezing.   Cardiovascular: Negative for chest pain, palpitations and leg swelling.  Gastrointestinal: Positive for anal bleeding and rectal pain. Negative for abdominal pain, constipation, diarrhea, nausea and vomiting.  Endocrine: Negative.   Genitourinary: Negative.  Negative for difficulty urinating, dysuria, genital sores and testicular pain.  Musculoskeletal: Negative.  Negative for back pain and myalgias.  Skin: Positive for rash.  Neurological: Negative.   Hematological: Negative for adenopathy. Does not bruise/bleed easily.  Psychiatric/Behavioral: Negative.   All other systems reviewed and are negative.  Objective:  BP (!) 176/76 (BP Location: Left Arm, Patient Position: Sitting, Cuff Size: Normal)    Pulse 83    Temp 97.8 F (36.6 C) (Oral)    Resp 16    Ht _0  (1.803 m)    Wt 164 lb (74.4 kg)    SpO2 90%    BMI 22.87 kg/m   BP Readings from Last 3 Encounters:  12/05/19 (!) 176/76  09/22/19 140/80  05/05/19 138/68    Wt Readings from Last 3 Encounters:  12/05/19 164 lb (74.4 kg)  09/22/19 163 lb (73.9 kg)  05/05/19 159 lb (72.1 kg)    Physical Exam Exam conducted with a  chaperone present Dale Anderson).  Constitutional:      Appearance: Normal appearance.  HENT:     Nose: Nose normal.     Mouth/Throat:     Mouth: Mucous membranes are moist.  Eyes:     Conjunctiva/sclera: Conjunctivae normal.  Cardiovascular:     Rate and Rhythm: Normal rate and regular rhythm.     Heart sounds: No murmur heard.   Pulmonary:     Breath sounds: No stridor. No wheezing, rhonchi or rales.  Abdominal:     General: Abdomen is flat. Bowel sounds are normal.     Palpations: There is no hepatomegaly, splenomegaly or mass.     Tenderness: There is no abdominal tenderness.  Genitourinary:    Pubic Area: No rash.      Penis: Normal.      Testes: Normal.     Prostate: Enlarged. Not tender and no nodules present.     Rectum: Normal. Guaiac result negative. No mass, tenderness, anal fissure or external hemorrhoid. Normal anal tone.       Comments: In bilateral intertriginous regions there are semilunar shaped areas of shiny, erythematous, moist, macerated skin.  There are no satellite lesions or pustules.  There is no involvement of the scrotum. Musculoskeletal:        General: Normal range of motion.     Cervical back: Neck supple.     Right lower leg: No edema.     Left lower leg: No edema.  Lymphadenopathy:     Cervical: No cervical adenopathy.  Skin:    General: Skin is warm.     Findings: Rash present.  Neurological:     General: No focal deficit present.     Mental Status: He is alert.     Lab Results  Component Value Date   WBC 6.0 12/22/2018   HGB 13.7 12/22/2018   HCT 40.6 12/22/2018   PLT 193.0 12/22/2018   GLUCOSE 138 (H) 09/22/2019   CHOL 136 12/22/2018   TRIG 80.0 12/22/2018   HDL 47.60 12/22/2018   LDLCALC 72 12/22/2018   ALT 16 12/22/2018   AST 15 12/22/2018   NA 139 09/22/2019   K 4.7 09/22/2019   CL 105 09/22/2019   CREATININE 1.51 (H) 09/22/2019   BUN 32 (H) 09/22/2019   CO2 29 09/22/2019   TSH 3.50 05/24/2018   PSA 2.55 08/21/2015     HGBA1C 7.7 (H) 09/22/2019   MICROALBUR 12.5 (H) 09/22/2019    US RENAL  Result Date: 07/26/2019 CLINICAL DATA:  Chronic kidney disease, stage III A EXAM: RENAL / URINARY TRACT ULTRASOUND COMPLETE COMPARISON:  None. FINDINGS: Right Kidney: Renal measurements: 10.0 x 4.3 x 4.8 cm = volume: 109 mL . Echogenicity within normal limits. No solid mass or hydronephrosis visualized. There are 3 cysts in the right kidney. The  largest is on the upper pole measuring 3 cm. Left Kidney: Renal measurements: 10.4 x 4.7 x 4.5 cm = volume: 116. mL. Echogenicity within normal limits. No mass or hydronephrosis visualized. Bladder: Appears normal for degree of bladder distention. Bilateral ureteral jets identified. Other: Prostate gland measures 4.5 x 3.8 x 5.1 cm with a volume of 46 cubic cm. IMPRESSION: No significant abnormality of the kidneys or bladder. Three benign-appearing cysts on the right kidney. Electronically Signed   By: Lorriane Shire M.D.   On: 07/26/2019 08:06    Assessment & Plan:   Jeremaine was seen today for rash.  Diagnoses and all orders for this visit:  Tinea cruris -     Ciclopirox 0.77 % gel; Apply 1 Act topically 2 (two) times daily.  Internal hemorrhoid- His sx's have resolved and he does not want to have this treated.   I am having Dale Anderson start on Ciclopirox. I am also having him maintain his Vitamin D, blood glucose meter kit and supplies, Lancets, FreeStyle Lite, levocetirizine, tamsulosin, FREESTYLE LITE, Multiple Vitamins-Minerals (CENTRUM SILVER PO), ASPIRIN 81 PO, HumaLOG KwikPen, NONFORMULARY OR COMPOUNDED ITEM, Insulin Pen Needle, fluocinonide, ALPRAZolam, atorvastatin, pantoprazole, telmisartan, L-Methylfolate-Algae-B12-B6, and Tresiba FlexTouch.  Meds ordered this encounter  Medications   Ciclopirox 0.77 % gel    Sig: Apply 1 Act topically 2 (two) times daily.    Dispense:  100 g    Refill:  2     Follow-up: Return in about 3 months (around  03/06/2020).  Scarlette Calico, MD

## 2019-12-08 DIAGNOSIS — K648 Other hemorrhoids: Secondary | ICD-10-CM | POA: Insufficient documentation

## 2019-12-08 MED FILL — ALPRAZolam 0.5 MG TABS: 0.5 | 30 days supply | Qty: 30 | Fill #2

## 2019-12-20 ENCOUNTER — Ambulatory Visit: Payer: 59 | Admitting: Podiatry

## 2019-12-21 MED FILL — L-METHYLFOLATE-ALGAE-B12-B6: 3-90.314-2- | 30 days supply | Qty: 60 | Fill #1

## 2020-01-04 ENCOUNTER — Other Ambulatory Visit: Payer: Self-pay

## 2020-01-04 ENCOUNTER — Ambulatory Visit (INDEPENDENT_AMBULATORY_CARE_PROVIDER_SITE_OTHER): Payer: 59 | Admitting: Internal Medicine

## 2020-01-04 ENCOUNTER — Encounter: Payer: Self-pay | Admitting: Internal Medicine

## 2020-01-04 VITALS — BP 170/68 | HR 55 | Temp 97.8°F | Ht 71.0 in | Wt 166.0 lb

## 2020-01-04 DIAGNOSIS — E118 Type 2 diabetes mellitus with unspecified complications: Secondary | ICD-10-CM

## 2020-01-04 DIAGNOSIS — N1831 Chronic kidney disease, stage 3a: Secondary | ICD-10-CM

## 2020-01-04 DIAGNOSIS — Z0000 Encounter for general adult medical examination without abnormal findings: Secondary | ICD-10-CM | POA: Diagnosis not present

## 2020-01-04 DIAGNOSIS — R011 Cardiac murmur, unspecified: Secondary | ICD-10-CM | POA: Diagnosis not present

## 2020-01-04 DIAGNOSIS — I10 Essential (primary) hypertension: Secondary | ICD-10-CM

## 2020-01-04 DIAGNOSIS — E785 Hyperlipidemia, unspecified: Secondary | ICD-10-CM

## 2020-01-04 DIAGNOSIS — R001 Bradycardia, unspecified: Secondary | ICD-10-CM | POA: Insufficient documentation

## 2020-01-04 NOTE — Progress Notes (Signed)
Subjective:  Patient ID: Dale Anderson, male    DOB: 06-04-1930  Age: 86 y.o. MRN: [id number]  CC: Annual Exam, Anemia, Diabetes, Hypertension, and Hyperlipidemia  This visit occurred during the SARS-CoV-2 public health emergency.  Safety protocols were in place, including screening questions prior to the visit, additional usage of staff PPE, and extensive cleaning of exam room while observing appropriate contact time as indicated for disinfecting solutions.    HPI Dale Anderson presents for a CPX.  He complains of a 3-day history of orthostatic dizziness.  He relates this to some emotional stressors.  He denies chest pain, shortness of breath, palpitations, edema, or fatigue.  He has not been monitoring his blood pressure or his blood sugar.  He does not feel presyncopal.  He denies headache, blurred vision, or paresthesias.  Outpatient Medications Prior to Visit  Medication Sig Dispense Refill  . ALPRAZolam (XANAX) 0.5 MG tablet TAKE 1 TABLET BY MOUTH DAILY AS NEEDED FOR ANXIETY 30 tablet 3  . ASPIRIN 81 PO Take by mouth.    Marland Kitchen atorvastatin (LIPITOR) 20 MG tablet TAKE 1 TABLET BY MOUTH DAILY. 90 tablet 1  . blood glucose meter kit and supplies KIT Use to test blood sugar once daily. DX: E11.9 1 each 0  . Blood Glucose Monitoring Suppl (FREESTYLE LITE) DEVI   0  . Cholecalciferol (VITAMIN D) 1000 UNITS capsule Take 1,000 Units by mouth daily.      . Ciclopirox 0.77 % gel Apply 1 Act topically 2 (two) times daily. 100 g 2  . fluocinonide (LIDEX) 0.05 % external solution APPLY TO THE AFFECTED AREA AS DIRECTED 60 mL 3  . glucose blood (FREESTYLE LITE) test strip USE TO CHECK BLOOD SUGAR ONCE DAILY 100 strip 1  . Insulin Lispro (HUMALOG KWIKPEN) 200 UNIT/ML SOPN Inject 5 Units into the skin 3 (three) times daily with meals. 9 mL 1  . Insulin Pen Needle 31G X 6 MM MISC Use to inject insulin five times a day. Dx: E11.8 130 each 5  . L-Methylfolate-Algae-B12-B6 3-90.314-2-35 MG CAPS  TAKE 1 CAPSULE BY MOUTH 2 (TWO) TIMES DAILY. 180 capsule 1  . Lancets MISC Use to test blood sugar once daily. DX E11.09 100 each 3  . levocetirizine (XYZAL) 5 MG tablet Take 1 tablet (5 mg total) by mouth every evening. 90 tablet 1  . Multiple Vitamins-Minerals (CENTRUM SILVER PO) Take by mouth.    . NONFORMULARY OR COMPOUNDED ITEM Peripheral Neuropathy Cream: Bupivacaine 1%, Doxepin 3%, Gabapentin 6%, Pentoxifylline 3%, Topiramate 1% Order faxed to [place] 1 each 3  . pantoprazole (PROTONIX) 40 MG tablet TAKE 1 TABLET BY MOUTH ONCE DAILY 90 tablet 1  . tamsulosin (FLOMAX) 0.4 MG CAPS capsule TAKE 1 CAPSULE BY MOUTH DAILY. 90 capsule 1  . telmisartan (MICARDIS) 40 MG tablet TAKE 1 TABLET BY MOUTH DAILY. 90 tablet 1  . TRESIBA FLEXTOUCH 200 UNIT/ML FlexTouch Pen INJECT 20 UNITS INTO THE SKIN DAILY. 6 mL 1   No facility-administered medications prior to visit.    ROS Review of Systems  Constitutional: Negative for appetite change, diaphoresis, fatigue, fever and unexpected weight change.  HENT: Negative.   Eyes: Negative.   Respiratory: Negative for cough, chest tightness, shortness of breath and wheezing.   Cardiovascular: Negative for chest pain, palpitations and leg swelling.  Gastrointestinal: Negative for abdominal pain, constipation, diarrhea, nausea and vomiting.  Endocrine: Negative.  Negative for polydipsia, polyphagia and polyuria.  Genitourinary: Negative.  Negative for difficulty urinating.  Musculoskeletal: Negative for arthralgias, back pain, myalgias and neck pain.  Skin: Negative.  Negative for color change, pallor and rash.  Neurological: Positive for dizziness and light-headedness. Negative for speech difficulty, weakness and headaches.  Hematological: Negative for adenopathy. Does not bruise/bleed easily.  Psychiatric/Behavioral: Negative.     Objective:  BP (!) 170/68 (BP Location: Left Arm, Patient Position: Sitting, Cuff Size: Normal)   Pulse (!) 55    Temp 97.8 F (36.6 C) (Oral)   Ht _0  (1.803 m)   Wt 166 lb (75.3 kg)   SpO2 90%   BMI 23.15 kg/m   BP Readings from Last 3 Encounters:  01/04/20 (!) 170/68  12/05/19 (!) 176/76  09/22/19 140/80    Wt Readings from Last 3 Encounters:  01/04/20 166 lb (75.3 kg)  12/05/19 164 lb (74.4 kg)  09/22/19 163 lb (73.9 kg)    Physical Exam Vitals reviewed.  Constitutional:      General: He is not in acute distress.    Appearance: He is not ill-appearing, toxic-appearing or diaphoretic.  HENT:     Nose: Nose normal.     Mouth/Throat:     Mouth: Mucous membranes are moist.  Eyes:     General: No scleral icterus.    Conjunctiva/sclera: Conjunctivae normal.  Cardiovascular:     Rate and Rhythm: Normal rate and regular rhythm.     Pulses: Normal pulses.     Heart sounds: Murmur heard.  Systolic murmur is present with a grade of 1/6.  No gallop.      Comments: EKG- Sinus bradycardia, 55 bpm Minimal LVH, LAFB No change from the prior EKG  1/6 SEM Pulmonary:     Effort: Pulmonary effort is normal.     Breath sounds: No stridor. No wheezing, rhonchi or rales.  Musculoskeletal:        General: Normal range of motion.     Cervical back: Neck supple.     Right lower leg: No edema.     Left lower leg: No edema.  Lymphadenopathy:     Cervical: No cervical adenopathy.  Skin:    General: Skin is warm and dry.     Coloration: Skin is not pale.  Neurological:     General: No focal deficit present.     Mental Status: He is alert and oriented to person, place, and time. Mental status is at baseline.  Psychiatric:        Mood and Affect: Mood normal.        Behavior: Behavior normal.     Lab Results  Component Value Date   WBC 5.1 01/04/2020   HGB 11.6 (L) 01/04/2020   HCT 34.8 (L) 01/04/2020   PLT 184 01/04/2020   GLUCOSE 159 (H) 01/04/2020   CHOL 133 01/04/2020   TRIG 85 01/04/2020   HDL 44 01/04/2020   LDLCALC 72 01/04/2020   ALT 16 12/22/2018   AST 15  12/22/2018   NA 143 01/04/2020   K 4.3 01/04/2020   CL 111 (H) 01/04/2020   CREATININE 1.56 (H) 01/04/2020   BUN 25 01/04/2020   CO2 25 01/04/2020   TSH 4.28 01/04/2020   PSA 2.55 08/21/2015   HGBA1C 7.8 (H) 01/04/2020   MICROALBUR 12.5 (H) 09/22/2019    US RENAL  Result Date: 07/26/2019 CLINICAL DATA:  Chronic kidney disease, stage III A EXAM: RENAL / URINARY TRACT ULTRASOUND COMPLETE COMPARISON:  None. FINDINGS: Right Kidney: Renal measurements: 10.0 x 4.3 x 4.8 cm = volume: 109 mL .  Echogenicity within normal limits. No solid mass or hydronephrosis visualized. There are 3 cysts in the right kidney. The largest is on the upper pole measuring 3 cm. Left Kidney: Renal measurements: 10.4 x 4.7 x 4.5 cm = volume: 116. mL. Echogenicity within normal limits. No mass or hydronephrosis visualized. Bladder: Appears normal for degree of bladder distention. Bilateral ureteral jets identified. Other: Prostate gland measures 4.5 x 3.8 x 5.1 cm with a volume of 46 cubic cm. IMPRESSION: No significant abnormality of the kidneys or bladder. Three benign-appearing cysts on the right kidney. Electronically Signed   By: Lorriane Shire M.D.   On: 07/26/2019 08:06    Assessment & Plan:   Hartman was seen today for annual exam, anemia, diabetes, hypertension and hyperlipidemia.  Diagnoses and all orders for this visit:  Essential hypertension- Considering his age and comorbid illnesses his blood pressure is adequately well controlled.  I do not think this is contributing to his symptoms. -     CBC with Differential/Platelet; Future -     Basic metabolic panel; Future -     EKG 12-Lead -     Basic metabolic panel -     CBC with Differential/Platelet  Type II diabetes mellitus with manifestations ([place])- His A1c is at 7.8%.  Considering his age and comorbid illnesses his blood sugars are adequately well controlled. -     Basic metabolic panel; Future -     Hemoglobin A1c; Future -     Hemoglobin A1c -      Basic metabolic panel  Stage 3a chronic kidney disease- His renal function is stable.  He will avoid nephrotoxic agents. -     Basic metabolic panel; Future -     Basic metabolic panel  Hyperlipidemia with target LDL less than 100- He has achieved his LDL goal and is doing well on the statin. -     Lipid panel; Future -     TSH; Future -     TSH -     Lipid panel  Routine general medical examination at a health care facility- Exam completed, labs reviewed, vaccines reviewed and updated, no cancer screenings are indicated, patient education material was given.  Murmur, cardiac- See below. -     ECHOCARDIOGRAM COMPLETE; Future  Bradycardia- He has new onset orthostatic dizziness that he relates to emotional stressors.  I think it is multifactorial in that he has developed a new onset anemia, bradycardia, and murmur.  He appears stable so will work this up as an outpatient with a cardiac event monitor, echocardiogram, and evaluation of the anemia. -     Cancel: [place]; Future -     CARDIAC EVENT MONITOR; Future   I am having Kelton G. Kuhner maintain his Vitamin D, blood glucose meter kit and supplies, Lancets, FreeStyle Lite, levocetirizine, tamsulosin, FREESTYLE LITE, Multiple Vitamins-Minerals (CENTRUM SILVER PO), ASPIRIN 81 PO, HumaLOG KwikPen, NONFORMULARY OR COMPOUNDED ITEM, Insulin Pen Needle, fluocinonide, ALPRAZolam, atorvastatin, pantoprazole, telmisartan, L-Methylfolate-Algae-B12-B6, [name] FlexTouch, and Ciclopirox.  No orders of the defined types were placed in this encounter.  In addition to time spent on CPE, I spent 60 minutes in preparing to see the patient by review of recent labs, imaging and procedures, obtaining and reviewing separately obtained history, communicating with the patient and family or caregiver, ordering medications, tests or procedures, and documenting clinical information in the EHR including the differential Dx, treatment, and any  further evaluation and other management of 1. Essential hypertension 2. Type II  diabetes mellitus with manifestations ([place]) 3. Stage 3a chronic kidney disease 4. Hyperlipidemia with target LDL less than 100 5. Murmur, cardiac 6. Bradycardia     Follow-up: Return in about 4 weeks (around 02/01/2020).  Scarlette Calico, MD

## 2020-01-04 NOTE — Patient Instructions (Signed)
Bradycardia, Adult Bradycardia is a slower-than-normal heartbeat. A normal resting heart rate for an adult ranges from 60 to 100 beats per minute. With bradycardia, the resting heart rate is less than 60 beats per minute. Bradycardia can prevent enough oxygen from reaching certain areas of your body when you are active. It can be serious if it keeps enough oxygen from reaching your brain and other parts of your body. Bradycardia is not a problem for everyone. For some healthy adults, a slow resting heart rate is normal. What are the causes? This condition may be caused by:  A problem with the heart, including: ? A problem with the heart's electrical system, such as a heart block. With a heart block, electrical signals between the chambers of the heart are partially or completely blocked, so they are not able to work as they should. ? A problem with the heart's natural pacemaker (sinus node). ? Heart disease. ? A heart attack. ? Heart damage. ? Lyme disease. ? A heart infection. ? A heart condition that is present at birth (congenital heart defect).  Certain medicines that treat heart conditions.  Certain conditions, such as hypothyroidism and obstructive sleep apnea.  Problems with the balance of chemicals and other substances, like potassium, in the blood.  Trauma.  Radiation therapy. What increases the risk? You are more likely to develop this condition if you:  Are age 65 or older.  Have high blood pressure (hypertension), high cholesterol (hyperlipidemia), or diabetes.  Drink heavily, use tobacco or nicotine products, or use drugs. What are the signs or symptoms? Symptoms of this condition include:  Light-headedness.  Feeling faint or fainting.  Fatigue and weakness.  Trouble with activity or exercise.  Shortness of breath.  Chest pain (angina).  Drowsiness.  Confusion.  Dizziness. How is this diagnosed? This condition may be diagnosed based on:  Your  symptoms.  Your medical history.  A physical exam. During the exam, your health care provider will listen to your heartbeat and check your pulse. To confirm the diagnosis, your health care provider may order tests, such as:  Blood tests.  An electrocardiogram (ECG). This test records the heart's electrical activity. The test can show how fast your heart is beating and whether the heartbeat is steady.  A test in which you wear a portable device (event recorder or Holter monitor) to record your heart's electrical activity while you go about your day.  Anexercise test. How is this treated? Treatment for this condition depends on the cause of the condition and how severe your symptoms are. Treatment may involve:  Treatment of the underlying condition.  Changing your medicines or how much medicine you take.  Having a small, battery-operated device called a pacemaker implanted under the skin. When bradycardia occurs, this device can be used to increase your heart rate and help your heart beat in a regular rhythm. Follow these instructions at home: Lifestyle   Manage any health conditions that contribute to bradycardia as told by your health care provider.  Follow a heart-healthy diet. A nutrition specialist (dietitian) can help educate you about healthy food options and changes.  Follow an exercise program that is approved by your health care provider.  Maintain a healthy weight.  Try to reduce or manage your stress, such as with yoga or meditation. If you need help reducing stress, ask your health care provider.  Do not use any products that contain nicotine or tobacco, such as cigarettes, e-cigarettes, and chewing tobacco. If you need help   quitting, ask your health care provider.  Do not use illegal drugs.  Limit alcohol intake to no more than 1 drink a day for nonpregnant women and 2 drinks a day for men. Be aware of how much alcohol is in your drink. In the [place] one drink  equals one 12 oz bottle of beer (355 mL), one 5 oz glass of wine (148 mL), or one 1 oz glass of hard liquor (44 mL). General instructions  Take over-the-counter and prescription medicines only as told by your health care provider.  Keep all follow-up visits as told by your health care provider. This is important. How is this prevented? In some cases, bradycardia may be prevented by:  Treating underlying medical problems.  Stopping behaviors or medicines that can trigger the condition. Contact a health care provider if you:  Feel light-headed or dizzy.  Almost faint.  Feel weak or are easily fatigued during physical activity.  Experience confusion or have memory problems. Get help right away if:  You faint.  You have: ? An irregular heartbeat (palpitations). ? Chest pain. ? Trouble breathing. Summary  Bradycardia is a slower-than-normal heartbeat. With bradycardia, the resting heart rate is less than 60 beats per minute.  Treatment for this condition depends on the cause.  Manage any health conditions that contribute to bradycardia as told by your health care provider.  Do not use any products that contain nicotine or tobacco, such as cigarettes, e-cigarettes, and chewing tobacco, and limit alcohol intake.  Keep all follow-up visits as told by your health care provider. This is important. This information is not intended to replace advice given to you by your health care provider. Make sure you discuss any questions you have with your health care provider. Document Revised: 12/21/2017 Document Reviewed: 11/18/2017 Elsevier Patient Education  2020 [employer] Inc.  

## 2020-01-05 ENCOUNTER — Telehealth: Payer: Self-pay | Admitting: Radiology

## 2020-01-05 LAB — CBC WITH DIFFERENTIAL/PLATELET
Absolute Monocytes: 525 cells/uL (ref 200–950)
Basophils Absolute: 31 cells/uL (ref 0–200)
Basophils Relative: 0.6 %
Eosinophils Absolute: 168 cells/uL (ref 15–500)
Eosinophils Relative: 3.3 %
HCT: 34.8 % — ABNORMAL LOW (ref 38.5–50.0)
Hemoglobin: 11.6 g/dL — ABNORMAL LOW (ref 13.2–17.1)
Lymphs Abs: 1112 cells/uL (ref 850–3900)
MCH: 31.2 pg (ref 27.0–33.0)
MCHC: 33.3 g/dL (ref 32.0–36.0)
MCV: 93.5 fL (ref 80.0–100.0)
MPV: 10.4 fL (ref 7.5–12.5)
Monocytes Relative: 10.3 %
Neutro Abs: 3264 cells/uL (ref 1500–7800)
Neutrophils Relative %: 64 %
Platelets: 184 10*3/uL (ref 140–400)
RBC: 3.72 10*6/uL — ABNORMAL LOW (ref 4.20–5.80)
RDW: 12.8 % (ref 11.0–15.0)
Total Lymphocyte: 21.8 %
WBC: 5.1 10*3/uL (ref 3.8–10.8)

## 2020-01-05 LAB — HEMOGLOBIN A1C
Hgb A1c MFr Bld: 7.8 % of total Hgb — ABNORMAL HIGH (ref <5.7)
Mean Plasma Glucose: 177 (calc)
eAG (mmol/L): 9.8 (calc)

## 2020-01-05 LAB — BASIC METABOLIC PANEL
BUN/Creatinine Ratio: 16 (calc) (ref 6–22)
BUN: 25 mg/dL (ref 7–25)
CO2: 25 mmol/L (ref 20–32)
Calcium: 8.7 mg/dL (ref 8.6–10.3)
Chloride: 111 mmol/L — ABNORMAL HIGH (ref 98–110)
Creat: 1.56 mg/dL — ABNORMAL HIGH (ref 0.70–1.11)
Glucose, Bld: 159 mg/dL — ABNORMAL HIGH (ref 65–99)
Potassium: 4.3 mmol/L (ref 3.5–5.3)
Sodium: 143 mmol/L (ref 135–146)

## 2020-01-05 LAB — TSH: TSH: 4.28 mIU/L (ref 0.40–4.50)

## 2020-01-05 LAB — LIPID PANEL
Cholesterol: 133 mg/dL (ref <200)
HDL: 44 mg/dL (ref >=40)
LDL Cholesterol (Calc): 72 mg/dL (calc)
Non-HDL Cholesterol (Calc): 89 mg/dL (calc) (ref <130)
Total CHOL/HDL Ratio: 3 (calc) (ref <5.0)
Triglycerides: 85 mg/dL (ref <150)

## 2020-01-05 NOTE — Telephone Encounter (Signed)
Enrolled patient for a 30 day [employer] Event Monitor to be mailed to patients home. Brief instructions were gone over with the patient and he knows to expect the monitor to arrive in 4-5 days.  

## 2020-01-12 ENCOUNTER — Ambulatory Visit (INDEPENDENT_AMBULATORY_CARE_PROVIDER_SITE_OTHER): Payer: 59 | Admitting: Internal Medicine

## 2020-01-12 ENCOUNTER — Telehealth: Payer: Self-pay | Admitting: Cardiovascular Disease

## 2020-01-12 ENCOUNTER — Other Ambulatory Visit: Payer: Self-pay

## 2020-01-12 ENCOUNTER — Ambulatory Visit (INDEPENDENT_AMBULATORY_CARE_PROVIDER_SITE_OTHER): Payer: 59

## 2020-01-12 ENCOUNTER — Encounter: Payer: Self-pay | Admitting: Internal Medicine

## 2020-01-12 VITALS — BP 128/78 | HR 81 | Temp 97.8°F | Ht 71.0 in | Wt 166.0 lb

## 2020-01-12 DIAGNOSIS — I4891 Unspecified atrial fibrillation: Secondary | ICD-10-CM | POA: Insufficient documentation

## 2020-01-12 DIAGNOSIS — R001 Bradycardia, unspecified: Secondary | ICD-10-CM

## 2020-01-12 DIAGNOSIS — D539 Nutritional anemia, unspecified: Secondary | ICD-10-CM

## 2020-01-12 DIAGNOSIS — N1831 Chronic kidney disease, stage 3a: Secondary | ICD-10-CM | POA: Diagnosis not present

## 2020-01-12 MED ORDER — APIXABAN 2.5 MG PO TABS: 2.5000 mg | ORAL_TABLET | Freq: Two times a day (BID) | ORAL | 0 refills | Status: DC

## 2020-01-12 MED FILL — ELIQUIS 2.5 MG TABLET: 2.5 | 90 days supply | Qty: 180 | Fill #0

## 2020-01-12 NOTE — Progress Notes (Signed)
Subjective:  Patient ID: Dale Anderson, male    DOB: 1930/01/14  Age: 86 y.o. MRN: [id number]  CC: Anemia  This visit occurred during the SARS-CoV-2 public health emergency.  Safety protocols were in place, including screening questions prior to the visit, additional usage of staff PPE, and extensive cleaning of exam room while observing appropriate contact time as indicated for disinfecting solutions.    HPI Dale Anderson presents for f/up - He continues to complain of intermittent episodes of dizziness and lightheadedness.  He denies palpitations or near syncope.  He is active and denies any recent episodes of chest pain, shortness of breath, edema, or fatigue.  Outpatient Medications Prior to Visit  Medication Sig Dispense Refill  . ALPRAZolam (XANAX) 0.5 MG tablet TAKE 1 TABLET BY MOUTH DAILY AS NEEDED FOR ANXIETY 30 tablet 3  . atorvastatin (LIPITOR) 20 MG tablet TAKE 1 TABLET BY MOUTH DAILY. 90 tablet 1  . blood glucose meter kit and supplies KIT Use to test blood sugar once daily. DX: E11.9 1 each 0  . Blood Glucose Monitoring Suppl (FREESTYLE LITE) DEVI   0  . Cholecalciferol (VITAMIN D) 1000 UNITS capsule Take 1,000 Units by mouth daily.      . Ciclopirox 0.77 % gel Apply 1 Act topically 2 (two) times daily. 100 g 2  . fluocinonide (LIDEX) 0.05 % external solution APPLY TO THE AFFECTED AREA AS DIRECTED 60 mL 3  . glucose blood (FREESTYLE LITE) test strip USE TO CHECK BLOOD SUGAR ONCE DAILY 100 strip 1  . Insulin Lispro (HUMALOG KWIKPEN) 200 UNIT/ML SOPN Inject 5 Units into the skin 3 (three) times daily with meals. 9 mL 1  . Insulin Pen Needle 31G X 6 MM MISC Use to inject insulin five times a day. Dx: E11.8 130 each 5  . L-Methylfolate-Algae-B12-B6 3-90.314-2-35 MG CAPS TAKE 1 CAPSULE BY MOUTH 2 (TWO) TIMES DAILY. 180 capsule 1  . Lancets MISC Use to test blood sugar once daily. DX E11.09 100 each 3  . levocetirizine (XYZAL) 5 MG tablet Take 1 tablet (5 mg total) by  mouth every evening. 90 tablet 1  . Multiple Vitamins-Minerals (CENTRUM SILVER PO) Take by mouth.    . NONFORMULARY OR COMPOUNDED ITEM Peripheral Neuropathy Cream: Bupivacaine 1%, Doxepin 3%, Gabapentin 6%, Pentoxifylline 3%, Topiramate 1% Order faxed to [facility] 1 each 3  . pantoprazole (PROTONIX) 40 MG tablet TAKE 1 TABLET BY MOUTH ONCE DAILY 90 tablet 1  . tamsulosin (FLOMAX) 0.4 MG CAPS capsule TAKE 1 CAPSULE BY MOUTH DAILY. 90 capsule 1  . telmisartan (MICARDIS) 40 MG tablet TAKE 1 TABLET BY MOUTH DAILY. 90 tablet 1  . TRESIBA FLEXTOUCH 200 UNIT/ML FlexTouch Pen INJECT 20 UNITS INTO THE SKIN DAILY. 6 mL 1  . ASPIRIN 81 PO Take by mouth.     No facility-administered medications prior to visit.    ROS Review of Systems  Constitutional: Negative for chills, diaphoresis, fatigue and fever.  HENT: Negative.   Eyes: Negative for visual disturbance.  Respiratory: Negative for cough, chest tightness, shortness of breath and wheezing.   Cardiovascular: Negative for chest pain, palpitations and leg swelling.  Gastrointestinal: Negative for abdominal pain, constipation, diarrhea, nausea and vomiting.  Endocrine: Negative.   Genitourinary: Negative.   Musculoskeletal: Negative for arthralgias.  Skin: Negative.  Negative for color change.  Neurological: Positive for dizziness and light-headedness. Negative for weakness and numbness.  Hematological: Negative for adenopathy. Does not bruise/bleed easily.  Psychiatric/Behavioral: Negative.  Objective:  BP 128/78   Pulse 81   Temp 97.8 F (36.6 C) (Oral)   Ht '5\' 11"'  (1.803 m)   Wt 166 lb (75.3 kg)   SpO2 98%   BMI 23.15 kg/m   BP Readings from Last 3 Encounters:  01/12/20 128/78  01/04/20 (!) 170/68  12/05/19 (!) 176/76    Wt Readings from Last 3 Encounters:  01/12/20 166 lb (75.3 kg)  01/04/20 166 lb (75.3 kg)  12/05/19 164 lb (74.4 kg)    Physical Exam Vitals reviewed.  HENT:     Nose: Nose normal.      Mouth/Throat:     Mouth: Mucous membranes are moist.  Eyes:     General: No scleral icterus.    Conjunctiva/sclera: Conjunctivae normal.  Cardiovascular:     Rate and Rhythm: Normal rate and regular rhythm.     Heart sounds: Murmur heard.  Systolic murmur is present with a grade of 2/6.   Pulmonary:     Effort: Pulmonary effort is normal.     Breath sounds: No stridor. No wheezing, rhonchi or rales.  Abdominal:     General: Abdomen is flat.     Palpations: There is no mass.     Tenderness: There is no abdominal tenderness. There is no guarding.  Musculoskeletal:     Cervical back: Neck supple.     Right lower leg: No edema.     Left lower leg: No edema.  Lymphadenopathy:     Cervical: No cervical adenopathy.  Skin:    General: Skin is warm and dry.     Coloration: Skin is pale.  Neurological:     General: No focal deficit present.     Mental Status: He is alert.  Psychiatric:        Mood and Affect: Mood normal.        Behavior: Behavior normal.     Lab Results  Component Value Date   WBC 6.7 01/12/2020   HGB 11.9 (L) 01/12/2020   HCT 34.9 (L) 01/12/2020   PLT 202 01/12/2020   GLUCOSE 159 (H) 01/04/2020   CHOL 133 01/04/2020   TRIG 85 01/04/2020   HDL 44 01/04/2020   LDLCALC 72 01/04/2020   ALT 16 12/22/2018   AST 15 12/22/2018   NA 143 01/04/2020   K 4.3 01/04/2020   CL 111 (H) 01/04/2020   CREATININE 1.56 (H) 01/04/2020   BUN 25 01/04/2020   CO2 25 01/04/2020   TSH 4.28 01/04/2020   PSA 2.55 08/21/2015   HGBA1C 7.8 (H) 01/04/2020   MICROALBUR 12.5 (H) 09/22/2019    US RENAL  Result Date: 07/26/2019 CLINICAL DATA:  Chronic kidney disease, stage III A EXAM: RENAL / URINARY TRACT ULTRASOUND COMPLETE COMPARISON:  None. FINDINGS: Right Kidney: Renal measurements: 10.0 x 4.3 x 4.8 cm = volume: 109 mL . Echogenicity within normal limits. No solid mass or hydronephrosis visualized. There are 3 cysts in the right kidney. The largest is on the upper pole measuring  3 cm. Left Kidney: Renal measurements: 10.4 x 4.7 x 4.5 cm = volume: 116. mL. Echogenicity within normal limits. No mass or hydronephrosis visualized. Bladder: Appears normal for degree of bladder distention. Bilateral ureteral jets identified. Other: Prostate gland measures 4.5 x 3.8 x 5.1 cm with a volume of 46 cubic cm. IMPRESSION: No significant abnormality of the kidneys or bladder. Three benign-appearing cysts on the right kidney. Electronically Signed   By: Lorriane Shire M.D.   On: 07/26/2019 08:06  Assessment & Plan:   Obediah was seen today for anemia.  Diagnoses and all orders for this visit:  Deficiency anemia- His H&H have improved.  I will monitor him for vitamin deficiencies. -     CBC with Differential/Platelet; Future -     Reticulocytes; Future -     Iron; Future -     Ferritin; Future -     Folate; Future -     Vitamin B12; Future -     IBC panel; Future -     Cancel: Vitamin B1; Future -     Vitamin B12 -     Folate -     Ferritin -     Iron -     Reticulocytes -     CBC with Differential/Platelet -     Vitamin B1; Future -     Vitamin B1  Atrial fibrillation by electrocardiogram ([place])- We placed his event monitor today and were later informed by cardiology that he is having episodes of A. fib with RVR.  Will start anticoagulation with apixaban and I have asked him to see cardiology as soon as possible. -     Ambulatory referral to Cardiology -     apixaban (ELIQUIS) 2.5 MG TABS tablet; Take 1 tablet (2.5 mg total) by mouth 2 (two) times daily.  Stage 3a chronic kidney disease- He will avoid nephrotoxic agents.  Other orders -     Cancel: Vitamin B1   I have discontinued [name]. [employer]'s ASPIRIN 81 PO. I am also having him start on apixaban. Additionally, I am having him maintain his Vitamin D, blood glucose meter kit and supplies, Lancets, FreeStyle Lite, levocetirizine, tamsulosin, FREESTYLE LITE, Multiple Vitamins-Minerals (CENTRUM SILVER PO),  HumaLOG KwikPen, NONFORMULARY OR COMPOUNDED ITEM, Insulin Pen Needle, fluocinonide, ALPRAZolam, atorvastatin, pantoprazole, telmisartan, L-Methylfolate-Algae-B12-B6, [name] FlexTouch, and Ciclopirox.  Meds ordered this encounter  Medications  . apixaban (ELIQUIS) 2.5 MG TABS tablet    Sig: Take 1 tablet (2.5 mg total) by mouth 2 (two) times daily.    Dispense:  180 tablet    Refill:  0     Follow-up: Return in about 3 months (around 04/13/2020).  Scarlette Calico, MD

## 2020-01-12 NOTE — Patient Instructions (Signed)
Anemia  Anemia is a condition in which you do not have enough red blood cells or hemoglobin. Hemoglobin is a substance in red blood cells that carries oxygen. When you do not have enough red blood cells or hemoglobin (are anemic), your body cannot get enough oxygen and your organs may not work properly. As a result, you may feel very tired or have other problems. What are the causes? Common causes of anemia include:  Excessive bleeding. Anemia can be caused by excessive bleeding inside or outside the body, including bleeding from the intestine or from periods in women.  Poor nutrition.  Long-lasting (chronic) kidney, thyroid, and liver disease.  Bone marrow disorders.  Cancer and treatments for cancer.  HIV (human immunodeficiency virus) and AIDS (acquired immunodeficiency syndrome).  Treatments for HIV and AIDS.  Spleen problems.  Blood disorders.  Infections, medicines, and autoimmune disorders that destroy red blood cells. What are the signs or symptoms? Symptoms of this condition include:  Minor weakness.  Dizziness.  Headache.  Feeling heartbeats that are irregular or faster than normal (palpitations).  Shortness of breath, especially with exercise.  Paleness.  Cold sensitivity.  Indigestion.  Nausea.  Difficulty sleeping.  Difficulty concentrating. Symptoms may occur suddenly or develop slowly. If your anemia is mild, you may not have symptoms. How is this diagnosed? This condition is diagnosed based on:  Blood tests.  Your medical history.  A physical exam.  Bone marrow biopsy. Your health care provider may also check your stool (feces) for blood and may do additional testing to look for the cause of your bleeding. You may also have other tests, including:  Imaging tests, such as a CT scan or MRI.  Endoscopy.  Colonoscopy. How is this treated? Treatment for this condition depends on the cause. If you continue to lose a lot of blood, you may  need to be treated at a hospital. Treatment may include:  Taking supplements of iron, vitamin S31, or folic acid.  Taking a hormone medicine (erythropoietin) that can help to stimulate red blood cell growth.  Having a blood transfusion. This may be needed if you lose a lot of blood.  Making changes to your diet.  Having surgery to remove your spleen. Follow these instructions at home:  Take over-the-counter and prescription medicines only as told by your health care provider.  Take supplements only as told by your health care provider.  Follow any diet instructions that you were given.  Keep all follow-up visits as told by your health care provider. This is important. Contact a health care provider if:  You develop new bleeding anywhere in the body. Get help right away if:  You are very weak.  You are short of breath.  You have pain in your abdomen or chest.  You are dizzy or feel faint.  You have trouble concentrating.  You have bloody or black, tarry stools.  You vomit repeatedly or you vomit up blood. Summary  Anemia is a condition in which you do not have enough red blood cells or enough of a substance in your red blood cells that carries oxygen (hemoglobin).  Symptoms may occur suddenly or develop slowly.  If your anemia is mild, you may not have symptoms.  This condition is diagnosed with blood tests as well as a medical history and physical exam. Other tests may be needed.  Treatment for this condition depends on the cause of the anemia. This information is not intended to replace advice given to you by  your health care provider. Make sure you discuss any questions you have with your health care provider. Document Revised: 05/22/2017 Document Reviewed: 07/11/2016 Elsevier Patient Education  [place].

## 2020-01-12 NOTE — Telephone Encounter (Signed)
Critical monitor recording report received. Noted at 1:30 pm CST, pt was in Afib RVR with a rate of 120 bpm.  Report showed to DOD Dr. Tamala Julian, who confirmed that the pt has PAF, and PCP may need to initiate anticoagulation and cardiac follow-up. DOD Dr. Tamala Julian signed the report and this will be scanned by Medical records in the AM, due to after business hours.  Will route this message to pts PCP Dr. Ronnald Ramp, to make him aware of monitor recording and recommended follow-up.  Called pts PCP and spoke with Verdis Frederickson.  Informed Maria of EKG recording and recommendation, per DOD Dr. Tamala Julian. Verdis Frederickson verbalized understanding and will endorse this to Dr. Ronnald Ramp.  PCP's office to follow-up with the pt accordingly.  When reviewing progress note from [facility] with Dr. Ronnald Ramp today, it appears they already initiated Eliquis 2.5 mg po bid and referred the pt to Cards, for follow-up of afib noted on EKG tracing.  Will route this call  to Dr. Ronnald Ramp, as an Juluis Rainier.   Off note: Monitor tech Markus Daft, will import this result as well to PCP Dr. Ronnald Ramp in-basket, for his review as well.  Monitor with written recommendation from Dr. Tamala Julian, will be scanned into the system in the AM.

## 2020-01-12 NOTE — Telephone Encounter (Signed)
Called [employer] to inquire about call placed on this pt with critical EKG results.  Spoke to [place] with [employer], and he cannot locate where [place] is at this time and he was unable to get another person in the lab to assist with this.  Per Jarrett Soho, he has left a message for [place] or next available Representative in the lab, to call our office back and request to speak with a triage nurse, in regards to abnormal EKG report.  Triage will await call back and follow-up accordingly thereafter.

## 2020-01-12 NOTE — Telephone Encounter (Signed)
[place] from [employer] with a critical EKG. Call dropped before I could transfer.

## 2020-01-12 NOTE — Telephone Encounter (Signed)
[place] from [employer] called back.  She states pt had his event monitor placed today and noted he was in afib.  Per [place], noted today at 1:30 pm CST, pt had a critical monitor recording showing afib with HR- 120 bpm. Pts PCP Dr. Ronnald Ramp ordered this for the pt, for complaints of dizziness.  [place] will fax over results now.  Will await monitor to show DOD for further review and recommendation, and endorse to ordering Provider, Dr. Ronnald Ramp, thereafter.

## 2020-01-13 LAB — CBC WITH DIFFERENTIAL/PLATELET
Absolute Monocytes: 422 cells/uL (ref 200–950)
Basophils Absolute: 34 cells/uL (ref 0–200)
Basophils Relative: 0.5 %
Eosinophils Absolute: 121 cells/uL (ref 15–500)
Eosinophils Relative: 1.8 %
HCT: 34.9 % — ABNORMAL LOW (ref 38.5–50.0)
Hemoglobin: 11.9 g/dL — ABNORMAL LOW (ref 13.2–17.1)
Lymphs Abs: 925 cells/uL (ref 850–3900)
MCH: 32 pg (ref 27.0–33.0)
MCHC: 34.1 g/dL (ref 32.0–36.0)
MCV: 93.8 fL (ref 80.0–100.0)
MPV: 11 fL (ref 7.5–12.5)
Monocytes Relative: 6.3 %
Neutro Abs: 5199 cells/uL (ref 1500–7800)
Neutrophils Relative %: 77.6 %
Platelets: 202 10*3/uL (ref 140–400)
RBC: 3.72 10*6/uL — ABNORMAL LOW (ref 4.20–5.80)
RDW: 12.6 % (ref 11.0–15.0)
Total Lymphocyte: 13.8 %
WBC: 6.7 10*3/uL (ref 3.8–10.8)

## 2020-01-13 LAB — RETICULOCYTES
ABS Retic: 48360 cells/uL (ref 25000–9000)
Retic Ct Pct: 1.3 %

## 2020-01-13 LAB — IRON: Iron: 86 ug/dL (ref 50–180)

## 2020-01-13 LAB — VITAMIN B12: Vitamin B-12: >2000 pg/mL — ABNORMAL HIGH (ref 200–1100)

## 2020-01-13 LAB — FERRITIN: Ferritin: 65 ng/mL (ref 24–380)

## 2020-01-13 LAB — FOLATE: Folate: >24 ng/mL

## 2020-01-15 NOTE — Progress Notes (Signed)
Cardiology Office Note:    Date:  01/16/2020   ID:  Dale Anderson, DOB Mar 06, 1930, MRN [id number]  PCP:  [name], MD  Cardiologist:  No primary care provider on file.   Referring MD: [name], MD   Chief Complaint  Patient presents with  . Atrial Fibrillation  . Anticoagulation    History of Present Illness:    Dale Anderson is a 86 y.o. male with a hx of DM II, hyperlipidemia, essential hypertension, anemia, recent orthostatic dizziness, and ECG and monitor documented Atrial Fibrillation with RVR. 01/12/20. Eliquis added.  Previously worked at [employer], [employer], a [place], and currently works as a Presenter, broadcasting at [employer].  Plans to retire when he turns 22.  Mr. Durrell voices no particular complaints.  States he is here after he complained to his physician, Dr. [name] that he would have some mild dizziness when he would stand.  Because of this a monitor was placed and pretty quickly after attachment, was noted to be in atrial fibrillation with rapid ventricular response.  He has not had syncope or chest pain.  No lower extremity swelling.  Denies orthopnea.  Past Medical History:  Diagnosis Date  . Adhesive capsulitis of right shoulder   . Anxiety   . Diabetes mellitus ([place])   . DJD (degenerative joint disease)   . GERD (gastroesophageal reflux disease)   . Hemorrhoid   . History of shingles   . Hyperlipidemia   . Hypertension   . Nodular prostate without urinary obstruction   . Recurrent bronchiectasis ([place])   . Tear of right rotator cuff     Past Surgical History:  Procedure Laterality Date  . COLONOSCOPY  1998  . HAND RECONSTRUCTION     right  . INGUINAL HERNIA REPAIR  2006   Right.  Dr. Bubba Camp  . ROTATOR CUFF REPAIR  1995   Left.  Dr. Wonda Olds  . SHOULDER ARTHROSCOPY WITH ROTATOR CUFF REPAIR AND SUBACROMIAL DECOMPRESSION  05/25/2012   Procedure: SHOULDER ARTHROSCOPY WITH ROTATOR CUFF REPAIR AND SUBACROMIAL  DECOMPRESSION;  Surgeon: Lorn Junes, MD;  Location: [facility];  Service: Orthopedics;  Laterality: Right;  Right Shoulder Arthroscopy shoulder decompression subacromial partial acromioplasty with coracoaromial release, arthroscopy shoulder distal claviculectomy, arthroscopy shoulder with rotator cuff repai    Current Medications: Current Meds  Medication Sig  . ALPRAZolam (XANAX) 0.5 MG tablet TAKE 1 TABLET BY MOUTH DAILY AS NEEDED FOR ANXIETY  . apixaban (ELIQUIS) 2.5 MG TABS tablet Take 1 tablet (2.5 mg total) by mouth 2 (two) times daily.  Marland Kitchen atorvastatin (LIPITOR) 20 MG tablet TAKE 1 TABLET BY MOUTH DAILY.  . blood glucose meter kit and supplies KIT Use to test blood sugar once daily. DX: E11.9  . Blood Glucose Monitoring Suppl (FREESTYLE LITE) DEVI   . Cholecalciferol (VITAMIN D) 1000 UNITS capsule Take 1,000 Units by mouth daily.    . Ciclopirox 0.77 % gel Apply 1 Act topically 2 (two) times daily.  . fluocinonide (LIDEX) 0.05 % external solution APPLY TO THE AFFECTED AREA AS DIRECTED  . glucose blood (FREESTYLE LITE) test strip USE TO CHECK BLOOD SUGAR ONCE DAILY  . Insulin Lispro (HUMALOG KWIKPEN) 200 UNIT/ML SOPN Inject 5 Units into the skin 3 (three) times daily with meals.  . Insulin Pen Needle 31G X 6 MM MISC Use to inject insulin five times a day. Dx: E11.8  . L-Methylfolate-Algae-B12-B6 3-90.314-2-35 MG CAPS TAKE 1 CAPSULE BY MOUTH 2 (  TWO) TIMES DAILY.  Marland Kitchen Lancets MISC Use to test blood sugar once daily. DX E11.09  . levocetirizine (XYZAL) 5 MG tablet Take 1 tablet (5 mg total) by mouth every evening.  . Multiple Vitamins-Minerals (CENTRUM SILVER PO) Take by mouth.  . NONFORMULARY OR COMPOUNDED ITEM Peripheral Neuropathy Cream: Bupivacaine 1%, Doxepin 3%, Gabapentin 6%, Pentoxifylline 3%, Topiramate 1% Order faxed to [facility]  . pantoprazole (PROTONIX) 40 MG tablet TAKE 1 TABLET BY MOUTH ONCE DAILY  . tamsulosin (FLOMAX) 0.4 MG CAPS capsule TAKE 1  CAPSULE BY MOUTH DAILY.  Marland Kitchen telmisartan (MICARDIS) 40 MG tablet TAKE 1 TABLET BY MOUTH DAILY.  Marland Kitchen TRESIBA FLEXTOUCH 200 UNIT/ML FlexTouch Pen INJECT 20 UNITS INTO THE SKIN DAILY.     Allergies:   Lisinopril   Social History   Socioeconomic History  . Marital status: Married    Spouse name: linda Ramsay  . Number of children: Not on file  . Years of education: Not on file  . Highest education level: Not on file  Occupational History  . Occupation: works for Psychologist, prison and probation services: [employer]  Tobacco Use  . Smoking status: Never Smoker  . Smokeless tobacco: Never Used  Vaping Use  . Vaping Use: Never used  Substance and Sexual Activity  . Alcohol use: No  . Drug use: No  . Sexual activity: Yes  Other Topics Concern  . Not on file  Social History Narrative   One story home   Right handed   Works for security at [employer]       One son   Social Determinants of Health   Financial Resource Strain:   . Difficulty of Paying Living Expenses:   Food Insecurity:   . Worried About Charity fundraiser in the Last Year:   . Arboriculturist in the Last Year:   Transportation Needs:   . Film/video editor (Medical):   Marland Kitchen Lack of Transportation (Non-Medical):   Physical Activity:   . Days of Exercise per Week:   . Minutes of Exercise per Session:   Stress:   . Feeling of Stress :   Social Connections:   . Frequency of Communication with Friends and Family:   . Frequency of Social Gatherings with Friends and Family:   . Attends Religious Services:   . Active Member of Clubs or Organizations:   . Attends Archivist Meetings:   Marland Kitchen Marital Status:      Family History: The patient's family history includes Emphysema in an other family member; Heart disease in his father. There is no history of Colon cancer.  ROS:   Please see the history of present illness.    No history of stroke, fluid accumulation, heart attack, vascular disease, and has no  bleeding issues.  Since starting Eliquis, he has had no difficulty.  All other systems reviewed and are negative.  EKGs/Labs/Other Studies Reviewed:    The following studies were reviewed today: Immediately after putting the monitor on he had atrial for both rapid ventricular response, that was intermittent.  EKG:  EKG EKG done earlier this month reveal normal sinus rhythm with poor line long PR interval and otherwise unremarkable.  Recent Labs: 01/04/2020: BUN 25; Creat 1.56; Potassium 4.3; Sodium 143; TSH 4.28 01/12/2020: Hemoglobin 11.9; Platelets 202  Recent Lipid Panel    Component Value Date/Time   CHOL 133 01/04/2020 0933   TRIG 85 01/04/2020 0933   HDL 44 01/04/2020 0933  CHOLHDL 3.0 01/04/2020 0933   VLDL 16.0 12/22/2018 1406   LDLCALC 72 01/04/2020 0933    Physical Exam:    VS:  BP (!) 142/80 (BP Location: Right Arm, Patient Position: Sitting, Cuff Size: Normal)   Pulse 72   Ht '5\' 11"'  (1.803 m)   Wt 156 lb 6.4 oz (70.9 kg)   SpO2 92%   BMI 21.81 kg/m     Wt Readings from Last 3 Encounters:  01/16/20 156 lb 6.4 oz (70.9 kg)  01/12/20 166 lb (75.3 kg)  01/04/20 166 lb (75.3 kg)     GEN: Appears younger than stated age.. No acute distress HEENT: Normal NECK: No JVD. LYMPHATICS: No lymphadenopathy CARDIAC: Soft scratchy left mid sternal border systolic murmur.  RRR without diastolic murmur, gallop, or edema. VASCULAR:  Normal Pulses. No bruits. RESPIRATORY:  Clear to auscultation without rales, wheezing or rhonchi  ABDOMEN: Soft, non-tender, non-distended, No pulsatile mass, MUSCULOSKELETAL: No deformity  SKIN: Warm and dry NEUROLOGIC:  Alert and oriented x 3 PSYCHIATRIC:  Normal affect   ASSESSMENT:    1. Atrial fibrillation by electrocardiogram ([place])   2. On continuous oral anticoagulation   3. Stage 3 chronic kidney disease, unspecified whether stage 3a or 3b CKD   4. Type II diabetes mellitus with manifestations ([place])   5. Essential hypertension     6. Hyperlipidemia with target LDL less than 100   7. Educated about COVID-19 virus infection   8. Iron deficiency anemia due to chronic blood loss    PLAN:    In order of problems listed above:  1. Paroxysmal atrial fibrillation.  Currently is clinically in sinus rhythm.  He is anticoagulated.  I need to reevaluate the dose.  Creatinine is 1.37, he is 86 years old, will need to discuss whether 2.5 mg twice daily has a correct dose or if he should be on 5 mg twice per day. 2. Continue apixaban.  Determine if dose is adequate 3. Kidney function, CKD stage III. 4. Hemoglobin A1c most recently was 7.8. 5. Blood pressure is adequate for age.  I was not able to identify orthostasis on today's exam. 6. Agree with Lipitor, LDL is less than 72. 7. Has been vaccinated.  Not ill. 8. History of anemia.  Will need close follow-up on anticoagulation therapy.  Hemoglobin follow-up as needed.   Medication Adjustments/Labs and Tests Ordered: Current medicines are reviewed at length with the patient today.  Concerns regarding medicines are outlined above.  No orders of the defined types were placed in this encounter.  No orders of the defined types were placed in this encounter.   There are no Patient Instructions on file for this visit.   Signed, Sinclair Grooms, MD  01/16/2020 2:36 PM    [facility]

## 2020-01-16 ENCOUNTER — Encounter: Payer: Self-pay | Admitting: Interventional Cardiology

## 2020-01-16 ENCOUNTER — Ambulatory Visit (INDEPENDENT_AMBULATORY_CARE_PROVIDER_SITE_OTHER): Payer: 59 | Admitting: Interventional Cardiology

## 2020-01-16 ENCOUNTER — Other Ambulatory Visit: Payer: Self-pay

## 2020-01-16 VITALS — BP 142/80 | HR 72 | Ht 71.0 in | Wt 156.4 lb

## 2020-01-16 DIAGNOSIS — E785 Hyperlipidemia, unspecified: Secondary | ICD-10-CM | POA: Diagnosis not present

## 2020-01-16 DIAGNOSIS — I4891 Unspecified atrial fibrillation: Secondary | ICD-10-CM | POA: Diagnosis not present

## 2020-01-16 DIAGNOSIS — E118 Type 2 diabetes mellitus with unspecified complications: Secondary | ICD-10-CM | POA: Diagnosis not present

## 2020-01-16 DIAGNOSIS — D500 Iron deficiency anemia secondary to blood loss (chronic): Secondary | ICD-10-CM | POA: Diagnosis not present

## 2020-01-16 DIAGNOSIS — Z7901 Long term (current) use of anticoagulants: Secondary | ICD-10-CM | POA: Diagnosis not present

## 2020-01-16 DIAGNOSIS — Z7189 Other specified counseling: Secondary | ICD-10-CM

## 2020-01-16 DIAGNOSIS — N183 Chronic kidney disease, stage 3 unspecified: Secondary | ICD-10-CM

## 2020-01-16 DIAGNOSIS — I10 Essential (primary) hypertension: Secondary | ICD-10-CM

## 2020-01-16 LAB — VITAMIN B1: Vitamin B1 (Thiamine): 15 nmol/L (ref 8–30)

## 2020-01-16 NOTE — Patient Instructions (Signed)
Medication Instructions:  Your physician recommends that you continue on your current medications as directed. Please refer to the Current Medication list given to you today.  *If you need a refill on your cardiac medications before your next appointment, please call your pharmacy*   Lab Work: None If you have labs (blood work) drawn today and your tests are completely normal, you will receive your results only by: Marland Kitchen MyChart Message (if you have MyChart) OR . A paper copy in the mail If you have any lab test that is abnormal or we need to change your treatment, we will call you to review the results.   Testing/Procedures: None   Follow-Up: At [facility], you and your health needs are our priority.  As part of our continuing mission to provide you with exceptional heart care, we have created designated Provider Care Teams.  These Care Teams include your primary Cardiologist (physician) and Advanced Practice Providers (APPs -  Physician Assistants and Nurse Practitioners) who all work together to provide you with the care you need, when you need it.  We recommend signing up for the patient portal called "MyChart".  Sign up information is provided on this After Visit Summary.  MyChart is used to connect with patients for Virtual Visits (Telemedicine).  Patients are able to view lab/test results, encounter notes, upcoming appointments, etc.  Non-urgent messages can be sent to your provider as well.   To learn more about what you can do with MyChart, go to [email or web address].    Your next appointment:   Will be after the monitor it completed  The format for your next appointment:   In Person  Provider:   You may see Dr. Daneen Schick or one of the following Advanced Practice Providers on your designated Care Team:    Truitt Merle, NP  Cecilie Kicks, NP  Kathyrn Drown, NP    Other Instructions

## 2020-01-18 ENCOUNTER — Other Ambulatory Visit: Payer: Self-pay | Admitting: Internal Medicine

## 2020-01-18 DIAGNOSIS — E785 Hyperlipidemia, unspecified: Secondary | ICD-10-CM

## 2020-01-18 MED FILL — PANTOPRAZOLE SOD DR 40 MG T: 40 | 90 days supply | Qty: 90 | Fill #0

## 2020-01-18 MED FILL — ATORVASTATIN 20 MG TABLET: 20 | 90 days supply | Qty: 90 | Fill #0

## 2020-01-23 ENCOUNTER — Ambulatory Visit (HOSPITAL_COMMUNITY): Payer: 59 | Attending: Cardiology

## 2020-01-23 ENCOUNTER — Other Ambulatory Visit: Payer: Self-pay | Admitting: Internal Medicine

## 2020-01-23 ENCOUNTER — Other Ambulatory Visit: Payer: Self-pay

## 2020-01-23 DIAGNOSIS — F419 Anxiety disorder, unspecified: Secondary | ICD-10-CM

## 2020-01-23 DIAGNOSIS — R011 Cardiac murmur, unspecified: Secondary | ICD-10-CM | POA: Diagnosis not present

## 2020-01-23 LAB — ECHOCARDIOGRAM COMPLETE
Area-P 1/2: 2.8 cm2
S' Lateral: 2.6 cm

## 2020-01-23 MED FILL — ALPRAZolam 0.5 MG TABS: 0.5 | 30 days supply | Qty: 30 | Fill #0

## 2020-01-27 MED FILL — L-METHYLFOLATE-ALGAE-B12-B6: 3-90.314-2- | 30 days supply | Qty: 60 | Fill #2

## 2020-02-14 MED FILL — TRESIBA FLEXTOUCH 200 UNITS: 200 | 60 days supply | Qty: 6 | Fill #1

## 2020-02-28 MED FILL — L-METHYLFOLATE-ALGAE-B12-B6: 3-90.314-2- | 30 days supply | Qty: 60 | Fill #3

## 2020-03-16 ENCOUNTER — Other Ambulatory Visit: Payer: Self-pay | Admitting: Internal Medicine

## 2020-03-16 DIAGNOSIS — E118 Type 2 diabetes mellitus with unspecified complications: Secondary | ICD-10-CM

## 2020-03-16 MED FILL — HUMALOG 200 UNITS/ML KWIKPE: 200 | 80 days supply | Qty: 6 | Fill #0

## 2020-03-17 NOTE — Progress Notes (Signed)
Cardiology Office Note:    Date:  03/19/2020   ID:  Dale Anderson, DOB 1930/03/03, MRN [id number]  PCP:  Dale Lima, MD  Cardiologist:  Sinclair Grooms, MD   Referring MD: Dale Lima, MD   Chief Complaint  Patient presents with  . Atrial Fibrillation  . Hypertension    History of Present Illness:    Dale Anderson is a 86 y.o. male with a hx of DM II, hyperlipidemia, essential hypertension, anemia, recent orthostatic dizziness, and ECG and monitor documented Atrial Fibrillation with RVR. 01/12/20. Eliquis added.  No complaints.  He denies palpitations, chest pain, syncope, edema, angina, and transient neurological symptoms.  7-day monitor demonstrated a 3.5% atrial fib burden.  The remainder of the time he was in normal sinus rhythm.  Past Medical History:  Diagnosis Date  . Adhesive capsulitis of right shoulder   . Anxiety   . Diabetes mellitus ([place])   . DJD (degenerative joint disease)   . GERD (gastroesophageal reflux disease)   . Hemorrhoid   . History of shingles   . Hyperlipidemia   . Hypertension   . Nodular prostate without urinary obstruction   . Recurrent bronchiectasis ([place])   . Tear of right rotator cuff     Past Surgical History:  Procedure Laterality Date  . COLONOSCOPY  1998  . HAND RECONSTRUCTION     right  . INGUINAL HERNIA REPAIR  2006   Right.  Dr. Bubba Camp  . ROTATOR CUFF REPAIR  1995   Left.  Dr. Wonda Olds  . SHOULDER ARTHROSCOPY WITH ROTATOR CUFF REPAIR AND SUBACROMIAL DECOMPRESSION  05/25/2012   Procedure: SHOULDER ARTHROSCOPY WITH ROTATOR CUFF REPAIR AND SUBACROMIAL DECOMPRESSION;  Surgeon: Lorn Junes, MD;  Location: [facility];  Service: Orthopedics;  Laterality: Right;  Right Shoulder Arthroscopy shoulder decompression subacromial partial acromioplasty with coracoaromial release, arthroscopy shoulder distal claviculectomy, arthroscopy shoulder with rotator cuff repai    Current Medications: Current Meds    Medication Sig  . ALPRAZolam (XANAX) 0.5 MG tablet TAKE 1 TABLET BY MOUTH DAILY AS NEEDED FOR ANXIETY  . apixaban (ELIQUIS) 2.5 MG TABS tablet Take 1 tablet (2.5 mg total) by mouth 2 (two) times daily.  Marland Kitchen atorvastatin (LIPITOR) 20 MG tablet TAKE 1 TABLET BY MOUTH DAILY.  . blood glucose meter kit and supplies KIT Use to test blood sugar once daily. DX: E11.9  . Blood Glucose Monitoring Suppl (FREESTYLE LITE) DEVI   . Cholecalciferol (VITAMIN D) 1000 UNITS capsule Take 1,000 Units by mouth daily.    . Ciclopirox 0.77 % gel Apply 1 Act topically 2 (two) times daily.  . fluocinonide (LIDEX) 0.05 % external solution APPLY TO THE AFFECTED AREA AS DIRECTED  . glucose blood (FREESTYLE LITE) test strip USE TO CHECK BLOOD SUGAR ONCE DAILY  . HUMALOG KWIKPEN 200 UNIT/ML KwikPen INJECT 5 UNITS INTO THE SKIN 3 (THREE) TIMES DAILY WITH MEALS.  Marland Kitchen Insulin Pen Needle 31G X 6 MM MISC Use to inject insulin five times a day. Dx: E11.8  . L-Methylfolate-Algae-B12-B6 3-90.314-2-35 MG CAPS TAKE 1 CAPSULE BY MOUTH 2 (TWO) TIMES DAILY.  Marland Kitchen Lancets MISC Use to test blood sugar once daily. DX E11.09  . levocetirizine (XYZAL) 5 MG tablet Take 1 tablet (5 mg total) by mouth every evening.  . Multiple Vitamins-Minerals (CENTRUM SILVER PO) Take by mouth.  . pantoprazole (PROTONIX) 40 MG tablet TAKE 1 TABLET BY MOUTH ONCE DAILY  . tamsulosin (FLOMAX) 0.4 MG CAPS capsule TAKE 1  CAPSULE BY MOUTH DAILY.  Marland Kitchen telmisartan (MICARDIS) 40 MG tablet TAKE 1 TABLET BY MOUTH DAILY.  Marland Kitchen TRESIBA FLEXTOUCH 200 UNIT/ML FlexTouch Pen INJECT 20 UNITS INTO THE SKIN DAILY.  . [DISCONTINUED] Insulin Lispro (HUMALOG KWIKPEN) 200 UNIT/ML SOPN Inject 5 Units into the skin 3 (three) times daily with meals.  . [DISCONTINUED] L-Methylfolate-Algae-B12-B6 (METANX) 3-90.314-2-35 MG CAPS Take 1 capsule by mouth 2 (two) times daily.     Allergies:   Lisinopril   Social History   Socioeconomic History  . Marital status: Married    Spouse name: linda  Turton  . Number of children: Not on file  . Years of education: Not on file  . Highest education level: Not on file  Occupational History  . Occupation: works for Psychologist, prison and probation services: [employer]  Tobacco Use  . Smoking status: Never Smoker  . Smokeless tobacco: Never Used  Vaping Use  . Vaping Use: Never used  Substance and Sexual Activity  . Alcohol use: No  . Drug use: No  . Sexual activity: Yes  Other Topics Concern  . Not on file  Social History Narrative   One story home   Right handed   Works for security at [employer]       One son   Social Determinants of Health   Financial Resource Strain:   . Difficulty of Paying Living Expenses: Not on file  Food Insecurity:   . Worried About Charity fundraiser in the Last Year: Not on file  . Ran Out of Food in the Last Year: Not on file  Transportation Needs:   . Lack of Transportation (Medical): Not on file  . Lack of Transportation (Non-Medical): Not on file  Physical Activity:   . Days of Exercise per Week: Not on file  . Minutes of Exercise per Session: Not on file  Stress:   . Feeling of Stress : Not on file  Social Connections:   . Frequency of Communication with Friends and Family: Not on file  . Frequency of Social Gatherings with Friends and Family: Not on file  . Attends Religious Services: Not on file  . Active Member of Clubs or Organizations: Not on file  . Attends Archivist Meetings: Not on file  . Marital Status: Not on file     Family History: The patient's family history includes Emphysema in an other family member; Heart disease in his father. There is no history of Colon cancer.  ROS:   Please see the history of present illness.    No complaints.  Still works as a Presenter, broadcasting at [employer].  Ambulatory without problems.  All other systems reviewed and are negative.  EKGs/Labs/Other Studies Reviewed:    The following studies were reviewed today: Continuous monitor  01/12/2020 Study Highlights    Basic rhythm is NSR  Atrial fibrillation and atrial flutter with RVR and 3% burden during the 4.5 days of monitoring  No reported symptoms.   PAF with RVR, 3% burden.  2 D Doppler Echocardiogram 1. Left ventricular ejection fraction, by estimation, is 60 to 65%. The  left ventricle has normal function. The left ventricle has no regional  wall motion abnormalities. There is moderate left ventricular hypertrophy.  Left ventricular diastolic  parameters are consistent with Grade I diastolic dysfunction (impaired  relaxation). GLS -23.2%.  2. Right ventricular systolic function is normal. The right ventricular  size is normal. Tricuspid regurgitation signal is inadequate for assessing  PA pressure.  3. The mitral valve is normal in structure. Trivial mitral valve  regurgitation. No evidence of mitral stenosis.  4. The aortic valve is tricuspid. Aortic valve regurgitation is not  visualized. Mild aortic valve sclerosis is present, with no evidence of  aortic valve stenosis.  5. The inferior vena cava is normal in size with greater than 50%  respiratory variability, suggesting right atrial pressure of 3 mmHg.   EKG:  EKG not repeated  Recent Labs: 01/04/2020: BUN 25; Creat 1.56; Potassium 4.3; Sodium 143; TSH 4.28 01/12/2020: Hemoglobin 11.9; Platelets 202  Recent Lipid Panel    Component Value Date/Time   CHOL 133 01/04/2020 0933   TRIG 85 01/04/2020 0933   HDL 44 01/04/2020 0933   CHOLHDL 3.0 01/04/2020 0933   VLDL 16.0 12/22/2018 1406   LDLCALC 72 01/04/2020 0933    Physical Exam:    VS:  BP (!) 144/66   Pulse 76   Ht '5\' 11"'  (1.803 m)   Wt 165 lb 6 oz (75 kg)   SpO2 97%   BMI 23.07 kg/m     Wt Readings from Last 3 Encounters:  03/19/20 165 lb 6 oz (75 kg)  01/16/20 156 lb 6.4 oz (70.9 kg)  01/12/20 166 lb (75.3 kg)     GEN: Slender and appearing younger than stated age.. No acute distress HEENT: Normal NECK: No  JVD. LYMPHATICS: No lymphadenopathy CARDIAC:  RRR without murmur, gallop, or edema. VASCULAR:  Normal Pulses. No bruits. RESPIRATORY:  Clear to auscultation without rales, wheezing or rhonchi  ABDOMEN: Soft, non-tender, non-distended, No pulsatile mass, MUSCULOSKELETAL: No deformity  SKIN: Warm and dry NEUROLOGIC:  Alert and oriented x 3 PSYCHIATRIC:  Normal affect   ASSESSMENT:    1. Atrial fibrillation by electrocardiogram ([place])   2. On continuous oral anticoagulation   3. Stage 3 chronic kidney disease, unspecified whether stage 3a or 3b CKD ([place])   4. Type II diabetes mellitus with manifestations ([place])   5. Essential hypertension   6. Hyperlipidemia with target LDL less than 100   7. Educated about COVID-19 virus infection    PLAN:    In order of problems listed above:  1. Paroxysmal atrial fibrillation with low burden.  Continue Eliquis 2.5 mg twice daily.  This is based upon weight and creatinine clearance.  Add metoprolol succinate 25 mg daily to control rate when in atrial fibrillation.  EKG on return office visit. 2. Continue reduced dose Eliquis 3. Follow with primary care. 4. Hemoglobin A1c is 7.8. 5. Blood pressure control is adequate. 6. Continue atorvastatin 20 mg/day. 7. He is vaccinated and practicing social mitigation.  26-monthfollow-up with EKG   Medication Adjustments/Labs and Tests Ordered: Current medicines are reviewed at length with the patient today.  Concerns regarding medicines are outlined above.  No orders of the defined types were placed in this encounter.  No orders of the defined types were placed in this encounter.   There are no Patient Instructions on file for this visit.   Signed, HSinclair Grooms MD  03/19/2020 8:24 AM    [place]

## 2020-03-19 ENCOUNTER — Other Ambulatory Visit: Payer: Self-pay | Admitting: Interventional Cardiology

## 2020-03-19 ENCOUNTER — Other Ambulatory Visit: Payer: Self-pay

## 2020-03-19 ENCOUNTER — Encounter: Payer: Self-pay | Admitting: Interventional Cardiology

## 2020-03-19 ENCOUNTER — Ambulatory Visit (INDEPENDENT_AMBULATORY_CARE_PROVIDER_SITE_OTHER): Payer: 59 | Admitting: Interventional Cardiology

## 2020-03-19 VITALS — BP 144/66 | HR 76 | Ht 71.0 in | Wt 165.4 lb

## 2020-03-19 DIAGNOSIS — Z7901 Long term (current) use of anticoagulants: Secondary | ICD-10-CM | POA: Diagnosis not present

## 2020-03-19 DIAGNOSIS — I10 Essential (primary) hypertension: Secondary | ICD-10-CM | POA: Diagnosis not present

## 2020-03-19 DIAGNOSIS — N183 Chronic kidney disease, stage 3 unspecified: Secondary | ICD-10-CM

## 2020-03-19 DIAGNOSIS — E785 Hyperlipidemia, unspecified: Secondary | ICD-10-CM | POA: Diagnosis not present

## 2020-03-19 DIAGNOSIS — Z7189 Other specified counseling: Secondary | ICD-10-CM | POA: Diagnosis not present

## 2020-03-19 DIAGNOSIS — E118 Type 2 diabetes mellitus with unspecified complications: Secondary | ICD-10-CM | POA: Diagnosis not present

## 2020-03-19 DIAGNOSIS — I4891 Unspecified atrial fibrillation: Secondary | ICD-10-CM | POA: Diagnosis not present

## 2020-03-19 MED ORDER — METOPROLOL SUCCINATE ER 25 MG PO TB24: 25.0000 mg | ORAL_TABLET | Freq: Every day | ORAL | 3 refills | Status: DC

## 2020-03-19 MED FILL — METOPROLOL SUCCINATE ER 25: 25 | 90 days supply | Qty: 90 | Fill #0

## 2020-03-19 NOTE — Patient Instructions (Signed)
Medication Instructions:  1) START Metoprolol Succinate 25mg  once daily  *If you need a refill on your cardiac medications before your next appointment, please call your pharmacy*   Lab Work: None If you have labs (blood work) drawn today and your tests are completely normal, you will receive your results only by: Marland Kitchen MyChart Message (if you have MyChart) OR . A paper copy in the mail If you have any lab test that is abnormal or we need to change your treatment, we will call you to review the results.   Testing/Procedures: None   Follow-Up: At [facility], you and your health needs are our priority.  As part of our continuing mission to provide you with exceptional heart care, we have created designated Provider Care Teams.  These Care Teams include your primary Cardiologist (physician) and Advanced Practice Providers (APPs -  Physician Assistants and Nurse Practitioners) who all work together to provide you with the care you need, when you need it.  We recommend signing up for the patient portal called "MyChart".  Sign up information is provided on this After Visit Summary.  MyChart is used to connect with patients for Virtual Visits (Telemedicine).  Patients are able to view lab/test results, encounter notes, upcoming appointments, etc.  Non-urgent messages can be sent to your provider as well.   To learn more about what you can do with MyChart, go to [email or web address].    Your next appointment:   6-8 month(s)  The format for your next appointment:   In Person  Provider:   You may see Sinclair Grooms, MD or one of the following Advanced Practice Providers on your designated Care Team:    Truitt Merle, NP  Cecilie Kicks, NP  Kathyrn Drown, NP    Other Instructions

## 2020-03-27 ENCOUNTER — Encounter: Payer: Self-pay | Admitting: Internal Medicine

## 2020-03-27 ENCOUNTER — Ambulatory Visit (INDEPENDENT_AMBULATORY_CARE_PROVIDER_SITE_OTHER): Payer: 59 | Admitting: Internal Medicine

## 2020-03-27 ENCOUNTER — Other Ambulatory Visit: Payer: Self-pay

## 2020-03-27 VITALS — BP 144/82 | HR 63 | Temp 98.3°F | Resp 16 | Ht 71.0 in | Wt 168.0 lb

## 2020-03-27 DIAGNOSIS — I10 Essential (primary) hypertension: Secondary | ICD-10-CM

## 2020-03-27 DIAGNOSIS — E118 Type 2 diabetes mellitus with unspecified complications: Secondary | ICD-10-CM | POA: Diagnosis not present

## 2020-03-27 LAB — POCT GLYCOSYLATED HEMOGLOBIN (HGB A1C): Hemoglobin A1C: 7.7 % — AB (ref 4.0–5.6)

## 2020-03-27 NOTE — Patient Instructions (Signed)
Type 2 Diabetes Mellitus, Diagnosis, Adult Type 2 diabetes (type 2 diabetes mellitus) is a long-term (chronic) disease. In type 2 diabetes, one or both of these problems may be present:  The pancreas does not make enough of a hormone called insulin.  Cells in the body do not respond properly to insulin that the body makes (insulin resistance). Normally, insulin allows blood sugar (glucose) to enter cells in the body. The cells use glucose for energy. Insulin resistance or lack of insulin causes excess glucose to build up in the blood instead of going into cells. As a result, high blood glucose (hyperglycemia) develops. What increases the risk? The following factors may make you more likely to develop type 2 diabetes:  Having a family member with type 2 diabetes.  Being overweight or obese.  Having an inactive (sedentary) lifestyle.  Having been diagnosed with insulin resistance.  Having a history of prediabetes, gestational diabetes, or polycystic ovary syndrome (PCOS).  Being of American-Indian, African-American, Hispanic/Latino, or Asian/Pacific Islander descent. What are the signs or symptoms? In the early stage of this condition, you may not have symptoms. Symptoms develop slowly and may include:  Increased thirst (polydipsia).  Increased hunger(polyphagia).  Increased urination (polyuria).  Increased urination during the night (nocturia).  Unexplained weight loss.  Frequent infections that keep coming back (recurring).  Fatigue.  Weakness.  Vision changes, such as blurry vision.  Cuts or bruises that are slow to heal.  Tingling or numbness in the hands or feet.  Dark patches on the skin (acanthosis nigricans). How is this diagnosed? This condition is diagnosed based on your symptoms, your medical history, a physical exam, and your blood glucose level. Your blood glucose may be checked with one or more of the following blood tests:  A fasting blood glucose (FBG)  test. You will not be allowed to eat (you will fast) for 8 hours or longer before a blood sample is taken.  A random blood glucose test. This test checks blood glucose at any time of day regardless of when you ate.  An A1c (hemoglobin A1c) blood test. This test provides information about blood glucose control over the previous 2-3 months.  An oral glucose tolerance test (OGTT). This test measures your blood glucose at two times: ? After fasting. This is your baseline blood glucose level. ? Two hours after drinking a beverage that contains glucose. You may be diagnosed with type 2 diabetes if:  Your FBG level is 126 mg/dL (7.0 mmol/L) or higher.  Your random blood glucose level is 200 mg/dL (11.1 mmol/L) or higher.  Your A1c level is 6.5% or higher.  Your OGTT result is higher than 200 mg/dL (11.1 mmol/L). These blood tests may be repeated to confirm your diagnosis. How is this treated? Your treatment may be managed by a specialist called an endocrinologist. Type 2 diabetes may be treated by following instructions from your health care provider about:  Making diet and lifestyle changes. This may include: ? Following an individualized nutrition plan that is developed by a diet and nutrition specialist (registered dietitian). ? Exercising regularly. ? Finding ways to manage stress.  Checking your blood glucose level as often as told.  Taking diabetes medicines or insulin daily. This helps to keep your blood glucose levels in the healthy range. ? If you use insulin, you may need to adjust the dosage depending on how physically active you are and what foods you eat. Your health care provider will tell you how to adjust your dosage.    Taking medicines to help prevent complications from diabetes, such as: ? Aspirin. ? Medicine to lower cholesterol. ? Medicine to control blood pressure. Your health care provider will set individualized treatment goals for you. Your goals will be based on  your age, other medical conditions you have, and how you respond to diabetes treatment. Generally, the goal of treatment is to maintain the following blood glucose levels:  Before meals (preprandial): 80-130 mg/dL (4.4-7.2 mmol/L).  After meals (postprandial): below 180 mg/dL (10 mmol/L).  A1c level: less than 7%. Follow these instructions at home: Questions to ask your health care provider  Consider asking the following questions: ? Do I need to meet with a diabetes educator? ? Where can I find a support group for people with diabetes? ? What equipment will I need to manage my diabetes at home? ? What diabetes medicines do I need, and when should I take them? ? How often do I need to check my blood glucose? ? What number can I call if I have questions? ? When is my next appointment? General instructions  Take over-the-counter and prescription medicines only as told by your health care provider.  Keep all follow-up visits as told by your health care provider. This is important.  For more information about diabetes, visit: ? American Diabetes Association (ADA): www.diabetes.org ? American Association of Diabetes Educators (AADE): www.diabeteseducator.org Contact a health care provider if:  Your blood glucose is at or above 240 mg/dL (13.3 mmol/L) for 2 days in a row.  You have been sick or have had a fever for 2 days or longer, and you are not getting better.  You have any of the following problems for more than 6 hours: ? You cannot eat or drink. ? You have nausea and vomiting. ? You have diarrhea. Get help right away if:  Your blood glucose is lower than 54 mg/dL (3.0 mmol/L).  You become confused or you have trouble thinking clearly.  You have difficulty breathing.  You have moderate or large ketone levels in your urine. Summary  Type 2 diabetes (type 2 diabetes mellitus) is a long-term (chronic) disease. In type 2 diabetes, the pancreas does not make enough of a  hormone called insulin, or cells in the body do not respond properly to insulin that the body makes (insulin resistance).  This condition is treated by making diet and lifestyle changes and taking diabetes medicines or insulin.  Your health care provider will set individualized treatment goals for you. Your goals will be based on your age, other medical conditions you have, and how you respond to diabetes treatment.  Keep all follow-up visits as told by your health care provider. This is important. This information is not intended to replace advice given to you by your health care provider. Make sure you discuss any questions you have with your health care provider. Document Revised: 08/07/2017 Document Reviewed: 07/13/2015 Elsevier Patient Education  2020 Elsevier Inc.  

## 2020-03-27 NOTE — Progress Notes (Signed)
Subjective:  Patient ID: Dale Anderson, male    DOB: [birth year]  Age: 86 y.o. MRN: [id number]  CC: Hypertension and Diabetes  This visit occurred during the SARS-CoV-2 public health emergency.  Safety protocols were in place, including screening questions prior to the visit, additional usage of staff PPE, and extensive cleaning of exam room while observing appropriate contact time as indicated for disinfecting solutions.    HPI Dale Anderson presents for f/up - He tells me his blood sugars have been relatively well controlled.  He has mild intermittent polyuria but denies polydipsia or polyphagia.  He is active and denies any recent episodes of chest pain, shortness of breath, palpitations, edema, or fatigue.  Outpatient Medications Prior to Visit  Medication Sig Dispense Refill   ALPRAZolam (XANAX) 0.5 MG tablet TAKE 1 TABLET BY MOUTH DAILY AS NEEDED FOR ANXIETY 30 tablet 3   apixaban (ELIQUIS) 2.5 MG TABS tablet Take 1 tablet (2.5 mg total) by mouth 2 (two) times daily. 180 tablet 0   atorvastatin (LIPITOR) 20 MG tablet TAKE 1 TABLET BY MOUTH DAILY. 90 tablet 1   blood glucose meter kit and supplies KIT Use to test blood sugar once daily. DX: E11.9 1 each 0   Blood Glucose Monitoring Suppl (FREESTYLE LITE) DEVI   0   Cholecalciferol (VITAMIN D) 1000 UNITS capsule Take 1,000 Units by mouth daily.       Ciclopirox 0.77 % gel Apply 1 Act topically 2 (two) times daily. 100 g 2   fluocinonide (LIDEX) 0.05 % external solution APPLY TO THE AFFECTED AREA AS DIRECTED 60 mL 3   glucose blood (FREESTYLE LITE) test strip USE TO CHECK BLOOD SUGAR ONCE DAILY 100 strip 1   HUMALOG KWIKPEN 200 UNIT/ML KwikPen INJECT 5 UNITS INTO THE SKIN 3 (THREE) TIMES DAILY WITH MEALS. 6 mL 1   Insulin Pen Needle 31G X 6 MM MISC Use to inject insulin five times a day. Dx: E11.8 130 each 5   L-Methylfolate-Algae-B12-B6 3-90.314-2-35 MG CAPS TAKE 1 CAPSULE BY MOUTH 2 (TWO) TIMES DAILY. 180 capsule  1   Lancets MISC Use to test blood sugar once daily. DX E11.09 100 each 3   levocetirizine (XYZAL) 5 MG tablet Take 1 tablet (5 mg total) by mouth every evening. 90 tablet 1   metoprolol succinate (TOPROL XL) 25 MG 24 hr tablet Take 1 tablet (25 mg total) by mouth daily. 90 tablet 3   Multiple Vitamins-Minerals (CENTRUM SILVER PO) Take by mouth.     pantoprazole (PROTONIX) 40 MG tablet TAKE 1 TABLET BY MOUTH ONCE DAILY 90 tablet 1   tamsulosin (FLOMAX) 0.4 MG CAPS capsule TAKE 1 CAPSULE BY MOUTH DAILY. 90 capsule 1   telmisartan (MICARDIS) 40 MG tablet TAKE 1 TABLET BY MOUTH DAILY. 90 tablet 1   TRESIBA FLEXTOUCH 200 UNIT/ML FlexTouch Pen INJECT 20 UNITS INTO THE SKIN DAILY. 6 mL 1   No facility-administered medications prior to visit.    ROS Review of Systems  Constitutional: Negative for appetite change, diaphoresis and fatigue.  HENT: Negative.   Eyes: Negative.   Respiratory: Negative for cough, chest tightness, shortness of breath and wheezing.   Cardiovascular: Negative for chest pain, palpitations and leg swelling.  Gastrointestinal: Negative for abdominal pain, constipation, diarrhea, nausea and vomiting.  Endocrine: Positive for polyuria. Negative for polydipsia and polyphagia.  Genitourinary: Negative.  Negative for difficulty urinating, dysuria and hematuria.  Musculoskeletal: Negative for arthralgias and myalgias.  Skin: Negative.  Negative for color change.  Neurological: Negative.  Negative for dizziness, weakness and light-headedness.  Hematological: Negative for adenopathy. Does not bruise/bleed easily.  Psychiatric/Behavioral: Negative.     Objective:  BP (!) 144/82    Pulse 63    Temp 98.3 F (36.8 C) (Oral)    Resp 16    Ht '5\' 11"'  (1.803 m)    Wt 168 lb (76.2 kg)    SpO2 98%    BMI 23.43 kg/m   BP Readings from Last 3 Encounters:  03/27/20 (!) 144/82  03/19/20 (!) 144/66  01/16/20 (!) 142/80    Wt Readings from Last 3 Encounters:  03/27/20 168 lb  (76.2 kg)  03/19/20 165 lb 6 oz (75 kg)  01/16/20 156 lb 6.4 oz (70.9 kg)    Physical Exam Vitals reviewed.  HENT:     Nose: Nose normal.     Mouth/Throat:     Mouth: Mucous membranes are moist.  Eyes:     General: No scleral icterus.    Conjunctiva/sclera: Conjunctivae normal.  Cardiovascular:     Rate and Rhythm: Normal rate and regular rhythm.     Heart sounds: Murmur heard.  Systolic murmur is present with a grade of 1/6.  No friction rub. No gallop.   Pulmonary:     Breath sounds: No wheezing, rhonchi or rales.  Abdominal:     General: Abdomen is flat.     Palpations: There is no mass.     Tenderness: There is no abdominal tenderness. There is no guarding.  Musculoskeletal:        General: Normal range of motion.     Cervical back: Neck supple.  Lymphadenopathy:     Cervical: No cervical adenopathy.  Skin:    General: Skin is warm and dry.  Neurological:     General: No focal deficit present.     Mental Status: He is alert.  Psychiatric:        Mood and Affect: Mood normal.        Behavior: Behavior normal.     Lab Results  Component Value Date   WBC 6.7 01/12/2020   HGB 11.9 (L) 01/12/2020   HCT 34.9 (L) 01/12/2020   PLT 202 01/12/2020   GLUCOSE 159 (H) 01/04/2020   CHOL 133 01/04/2020   TRIG 85 01/04/2020   HDL 44 01/04/2020   LDLCALC 72 01/04/2020   ALT 16 12/22/2018   AST 15 12/22/2018   NA 143 01/04/2020   K 4.3 01/04/2020   CL 111 (H) 01/04/2020   CREATININE 1.56 (H) 01/04/2020   BUN 25 01/04/2020   CO2 25 01/04/2020   TSH 4.28 01/04/2020   PSA 2.55 08/21/2015   HGBA1C 7.7 (A) 03/27/2020   MICROALBUR 12.5 (H) 09/22/2019    US RENAL  Result Date: 07/26/2019 CLINICAL DATA:  Chronic kidney disease, stage III A EXAM: RENAL / URINARY TRACT ULTRASOUND COMPLETE COMPARISON:  None. FINDINGS: Right Kidney: Renal measurements: 10.0 x 4.3 x 4.8 cm = volume: 109 mL . Echogenicity within normal limits. No solid mass or hydronephrosis visualized. There  are 3 cysts in the right kidney. The largest is on the upper pole measuring 3 cm. Left Kidney: Renal measurements: 10.4 x 4.7 x 4.5 cm = volume: 116. mL. Echogenicity within normal limits. No mass or hydronephrosis visualized. Bladder: Appears normal for degree of bladder distention. Bilateral ureteral jets identified. Other: Prostate gland measures 4.5 x 3.8 x 5.1 cm with a volume of 46 cubic cm. IMPRESSION: No significant abnormality of the kidneys  or bladder. Three benign-appearing cysts on the right kidney. Electronically Signed   By: Lorriane Shire M.D.   On: 07/26/2019 08:06    Assessment & Plan:   Jamorris was seen today for hypertension and diabetes.  Diagnoses and all orders for this visit:  Primary hypertension- His blood pressure is adequately well controlled.  Will continue the ARB.  Type II diabetes mellitus with manifestations ([place])- His A1c is at 7.7%.  His blood sugar is adequately well controlled.  Will continue the combination of basal/bolus insulin. -     Ambulatory referral to Ophthalmology -     POCT glycosylated hemoglobin (Hb A1C)   I am having Luisenrique G. Lindfors maintain his Vitamin D, blood glucose meter kit and supplies, Lancets, FreeStyle Lite, levocetirizine, tamsulosin, FREESTYLE LITE, Multiple Vitamins-Minerals (CENTRUM SILVER PO), Insulin Pen Needle, fluocinonide, telmisartan, L-Methylfolate-Algae-B12-B6, [name] FlexTouch, Ciclopirox, apixaban, atorvastatin, pantoprazole, ALPRAZolam, HumaLOG KwikPen, and metoprolol succinate.  No orders of the defined types were placed in this encounter.    Follow-up: Return in about 6 months (around 09/25/2020).  Scarlette Calico, MD

## 2020-04-04 ENCOUNTER — Other Ambulatory Visit: Payer: Self-pay | Admitting: Internal Medicine

## 2020-04-04 DIAGNOSIS — E118 Type 2 diabetes mellitus with unspecified complications: Secondary | ICD-10-CM

## 2020-04-04 DIAGNOSIS — I10 Essential (primary) hypertension: Secondary | ICD-10-CM

## 2020-04-04 DIAGNOSIS — I4891 Unspecified atrial fibrillation: Secondary | ICD-10-CM

## 2020-04-04 MED FILL — ELIQUIS 2.5 MG TABLET: 2.5 | 90 days supply | Qty: 180 | Fill #0

## 2020-04-04 MED FILL — TELMISARTAN 40 MG TABS: 40 | 90 days supply | Qty: 90 | Fill #0

## 2020-04-04 MED FILL — L-METHYLFOLATE-ALGAE-B12-B6: 3-90.314-2- | 30 days supply | Qty: 60 | Fill #4

## 2020-04-04 MED FILL — ATORVASTATIN 20 MG TABLET: 20 | 90 days supply | Qty: 90 | Fill #1

## 2020-04-04 MED FILL — UNIFINE PENTIPS 6MM 31G: 31G X 6 MM | 20 days supply | Qty: 100 | Fill #0

## 2020-04-04 MED FILL — PANTOPRAZOLE SOD DR 40 MG T: 40 | 90 days supply | Qty: 90 | Fill #1

## 2020-05-01 MED FILL — ALPRAZolam 0.5 MG TABS: 0.5 | 30 days supply | Qty: 30 | Fill #1

## 2020-05-03 MED FILL — L-METHYLFOLATE-ALGAE-B12-B6: 3-90.314-2- | 30 days supply | Qty: 60 | Fill #5

## 2020-05-04 ENCOUNTER — Other Ambulatory Visit: Payer: Self-pay | Admitting: Internal Medicine

## 2020-05-04 DIAGNOSIS — E118 Type 2 diabetes mellitus with unspecified complications: Secondary | ICD-10-CM

## 2020-05-04 DIAGNOSIS — N1831 Chronic kidney disease, stage 3a: Secondary | ICD-10-CM | POA: Diagnosis not present

## 2020-05-04 MED FILL — TRESIBA FLEXTOUCH 200 UNITS: 200 | 60 days supply | Qty: 6 | Fill #0

## 2020-05-11 DIAGNOSIS — E1122 Type 2 diabetes mellitus with diabetic chronic kidney disease: Secondary | ICD-10-CM | POA: Diagnosis not present

## 2020-05-11 DIAGNOSIS — I4891 Unspecified atrial fibrillation: Secondary | ICD-10-CM | POA: Diagnosis not present

## 2020-05-11 DIAGNOSIS — I129 Hypertensive chronic kidney disease with stage 1 through stage 4 chronic kidney disease, or unspecified chronic kidney disease: Secondary | ICD-10-CM | POA: Diagnosis not present

## 2020-05-11 DIAGNOSIS — E559 Vitamin D deficiency, unspecified: Secondary | ICD-10-CM | POA: Diagnosis not present

## 2020-05-11 DIAGNOSIS — N1831 Chronic kidney disease, stage 3a: Secondary | ICD-10-CM | POA: Diagnosis not present

## 2020-06-06 ENCOUNTER — Other Ambulatory Visit: Payer: Self-pay | Admitting: Internal Medicine

## 2020-06-06 DIAGNOSIS — E1142 Type 2 diabetes mellitus with diabetic polyneuropathy: Secondary | ICD-10-CM

## 2020-06-06 MED FILL — L-METHYLFOLATE-ALGAE-B12-B6: 3-90.314-2- | 30 days supply | Qty: 60 | Fill #0

## 2020-06-13 ENCOUNTER — Telehealth: Payer: Self-pay | Admitting: Internal Medicine

## 2020-06-13 DIAGNOSIS — E118 Type 2 diabetes mellitus with unspecified complications: Secondary | ICD-10-CM

## 2020-06-13 NOTE — Progress Notes (Signed)
  Chronic Care Management   Note  06/13/2020 Name: Dale Anderson MRN: [id number] DOB: May 20, 1930  Dale Anderson is a 86 y.o. year old male who is a primary care patient of [name], MD. I reached out to Dale Anderson by phone today in response to a referral sent by Dale Anderson's PCP, [name], MD.   Dale Anderson was given information about Chronic Care Management services today including:  1. CCM service includes personalized support from designated clinical staff supervised by his physician, including individualized plan of care and coordination with other care providers 2. 24/7 contact phone numbers for assistance for urgent and routine care needs. 3. Service will only be billed when office clinical staff spend 20 minutes or more in a month to coordinate care. 4. Only one practitioner may furnish and bill the service in a calendar month. 5. The patient may stop CCM services at any time (effective at the end of the month) by phone call to the office staff.   Patient agreed to services and verbal consent obtained.   Follow up plan:   Carley Perdue [employer] Scheduler

## 2020-06-18 ENCOUNTER — Other Ambulatory Visit: Payer: Self-pay | Admitting: Internal Medicine

## 2020-06-18 DIAGNOSIS — E785 Hyperlipidemia, unspecified: Secondary | ICD-10-CM

## 2020-06-18 MED FILL — ALPRAZolam 0.5 MG TABS: 0.5 | 30 days supply | Qty: 30 | Fill #2

## 2020-06-18 MED FILL — UNIFINE PENTIPS 6MM 31G: 31G X 6 MM | 20 days supply | Qty: 100 | Fill #1

## 2020-06-18 MED FILL — METOPROLOL SUCCINATE ER 25: 25 | 90 days supply | Qty: 90 | Fill #1

## 2020-06-21 MED FILL — L-METHYLFOLATE-ALGAE-B12-B6: 3-90.314-2- | 30 days supply | Qty: 60 | Fill #0

## 2020-07-17 ENCOUNTER — Other Ambulatory Visit: Payer: Self-pay | Admitting: Internal Medicine

## 2020-07-17 ENCOUNTER — Other Ambulatory Visit: Payer: Self-pay

## 2020-07-17 ENCOUNTER — Ambulatory Visit (INDEPENDENT_AMBULATORY_CARE_PROVIDER_SITE_OTHER): Payer: 59 | Admitting: Internal Medicine

## 2020-07-17 ENCOUNTER — Encounter: Payer: Self-pay | Admitting: Internal Medicine

## 2020-07-17 VITALS — BP 156/88 | HR 87 | Temp 98.2°F | Ht 71.0 in | Wt 166.0 lb

## 2020-07-17 DIAGNOSIS — N183 Chronic kidney disease, stage 3 unspecified: Secondary | ICD-10-CM

## 2020-07-17 DIAGNOSIS — I10 Essential (primary) hypertension: Secondary | ICD-10-CM | POA: Diagnosis not present

## 2020-07-17 DIAGNOSIS — Z0000 Encounter for general adult medical examination without abnormal findings: Secondary | ICD-10-CM | POA: Diagnosis not present

## 2020-07-17 DIAGNOSIS — E1142 Type 2 diabetes mellitus with diabetic polyneuropathy: Secondary | ICD-10-CM

## 2020-07-17 DIAGNOSIS — E118 Type 2 diabetes mellitus with unspecified complications: Secondary | ICD-10-CM

## 2020-07-17 DIAGNOSIS — Z0001 Encounter for general adult medical examination with abnormal findings: Secondary | ICD-10-CM

## 2020-07-17 LAB — BASIC METABOLIC PANEL
BUN: 33 mg/dL — ABNORMAL HIGH (ref 6–23)
CO2: 28 mEq/L (ref 19–32)
Calcium: 10.1 mg/dL (ref 8.4–10.5)
Chloride: 105 mEq/L (ref 96–112)
Creatinine, Ser: 1.74 mg/dL — ABNORMAL HIGH (ref 0.40–1.50)
GFR: 34.65 mL/min — ABNORMAL LOW (ref >60.00)
Glucose, Bld: 200 mg/dL — ABNORMAL HIGH (ref 70–99)
Potassium: 5.3 mEq/L — ABNORMAL HIGH (ref 3.5–5.1)
Sodium: 139 mEq/L (ref 135–145)

## 2020-07-17 LAB — HEPATIC FUNCTION PANEL
ALT: 19 U/L (ref 0–53)
AST: 18 U/L (ref 0–37)
Albumin: 4.4 g/dL (ref 3.5–5.2)
Alkaline Phosphatase: 71 U/L (ref 39–117)
Bilirubin, Direct: 0.2 mg/dL (ref 0.0–0.3)
Total Bilirubin: 1.1 mg/dL (ref 0.2–1.2)
Total Protein: 7.8 g/dL (ref 6.0–8.3)

## 2020-07-17 LAB — CBC WITH DIFFERENTIAL/PLATELET
Basophils Absolute: 0 10*3/uL (ref 0.0–0.1)
Basophils Relative: 0.6 % (ref 0.0–3.0)
Eosinophils Absolute: 0.1 10*3/uL (ref 0.0–0.7)
Eosinophils Relative: 1.5 % (ref 0.0–5.0)
HCT: 39.6 % (ref 39.0–52.0)
Hemoglobin: 13.2 g/dL (ref 13.0–17.0)
Lymphocytes Relative: 19.8 % (ref 12.0–46.0)
Lymphs Abs: 1.3 10*3/uL (ref 0.7–4.0)
MCHC: 33.4 g/dL (ref 30.0–36.0)
MCV: 92.2 fl (ref 78.0–100.0)
Monocytes Absolute: 0.5 10*3/uL (ref 0.1–1.0)
Monocytes Relative: 6.9 % (ref 3.0–12.0)
Neutro Abs: 4.7 10*3/uL (ref 1.4–7.7)
Neutrophils Relative %: 71.2 % (ref 43.0–77.0)
Platelets: 209 10*3/uL (ref 150.0–400.0)
RBC: 4.29 Mil/uL (ref 4.22–5.81)
RDW: 14.1 % (ref 11.5–15.5)
WBC: 6.6 10*3/uL (ref 4.0–10.5)

## 2020-07-17 LAB — URINALYSIS, ROUTINE W REFLEX MICROSCOPIC
Bilirubin Urine: NEGATIVE
Hgb urine dipstick: NEGATIVE
Ketones, ur: NEGATIVE
Leukocytes,Ua: NEGATIVE
Nitrite: NEGATIVE
RBC / HPF: NONE SEEN (ref 0–?)
Specific Gravity, Urine: >=1.03 — AB (ref 1.000–1.030)
Total Protein, Urine: 100 — AB
Urine Glucose: NEGATIVE
Urobilinogen, UA: 0.2 (ref 0.0–1.0)
pH: 5.5 (ref 5.0–8.0)

## 2020-07-17 LAB — HEMOGLOBIN A1C: Hgb A1c MFr Bld: 8.4 % — ABNORMAL HIGH (ref 4.6–6.5)

## 2020-07-17 LAB — TSH: TSH: 2.32 u[IU]/mL (ref 0.35–4.50)

## 2020-07-17 MED ORDER — TRESIBA FLEXTOUCH 200 UNIT/ML ~~LOC~~ SOPN
30.0000 [IU] | PEN_INJECTOR | Freq: Every day | SUBCUTANEOUS | 1 refills | Status: DC
Start: 2020-07-17 — End: 2020-07-17

## 2020-07-17 MED ORDER — GVOKE HYPOPEN 2-PACK 1 MG/0.2ML ~~LOC~~ SOAJ: 1.0000 | Freq: Every day | SUBCUTANEOUS | 5 refills | Status: DC | PRN

## 2020-07-17 MED FILL — ATORVASTATIN CALCIUM 20 MG: 20 | 90 days supply | Qty: 90 | Fill #0

## 2020-07-17 MED FILL — L-METHYLFOLATE-ALGAE-B12-B6: 3-90.314-2- | 30 days supply | Qty: 60 | Fill #1

## 2020-07-17 MED FILL — PANTOPRAZOLE SOD DR 40 MG T: 40 | 90 days supply | Qty: 90 | Fill #0

## 2020-07-17 NOTE — Patient Instructions (Signed)
Type 2 Diabetes Mellitus, Diagnosis, Adult Type 2 diabetes (type 2 diabetes mellitus) is a long-term, or chronic, disease. In type 2 diabetes, one or both of these problems may be present:  The pancreas does not make enough of a hormone called insulin.  Cells in the body do not respond properly to insulin that the body makes (insulin resistance). Normally, insulin allows blood sugar (glucose) to enter cells in the body. The cells use glucose for energy. Insulin resistance or lack of insulin causes excess glucose to build up in the blood instead of going into cells. This causes high blood glucose (hyperglycemia).  What are the causes? The exact cause of type 2 diabetes is not known. What increases the risk? The following factors may make you more likely to develop this condition:  Having a family member with type 2 diabetes.  Being overweight or obese.  Being inactive (sedentary).  Having been diagnosed with insulin resistance.  Having a history of prediabetes, diabetes when you were pregnant (gestational diabetes), or polycystic ovary syndrome (PCOS). What are the signs or symptoms? In the early stage of this condition, you may not have symptoms. Symptoms develop slowly and may include:  Increased thirst or hunger.  Increased urination.  Unexplained weight loss.  Tiredness (fatigue) or weakness.  Vision changes, such as blurry vision.  Dark patches on the skin. How is this diagnosed? This condition is diagnosed based on your symptoms, your medical history, a physical exam, and your blood glucose level. Your blood glucose may be checked with one or more of the following blood tests:  A fasting blood glucose (FBG) test. You will not be allowed to eat (you will fast) for 8 hours or longer before a blood sample is taken.  A random blood glucose test. This test checks blood glucose at any time of day regardless of when you ate.  An A1C (hemoglobin A1C) blood test. This test  provides information about blood glucose levels over the previous 2-3 months.  An oral glucose tolerance test (OGTT). This test measures your blood glucose at two times: ? After fasting. This is your baseline blood glucose level. ? Two hours after drinking a beverage that contains glucose. You may be diagnosed with type 2 diabetes if:  Your fasting blood glucose level is 126 mg/dL (7.0 mmol/L) or higher.  Your random blood glucose level is 200 mg/dL (11.1 mmol/L) or higher.  Your A1C level is 6.5% or higher.  Your oral glucose tolerance test result is higher than 200 mg/dL (11.1 mmol/L). These blood tests may be repeated to confirm your diagnosis.   How is this treated? Your treatment may be managed by a specialist called an endocrinologist. Type 2 diabetes may be treated by following instructions from your health care provider about:  Making dietary and lifestyle changes. These may include: ? Following a personalized nutrition plan that is developed by a registered dietitian. ? Exercising regularly. ? Finding ways to manage stress.  Checking your blood glucose level as often as told.  Taking diabetes medicines or insulin daily. This helps to keep your blood glucose levels in the healthy range.  Taking medicines to help prevent complications from diabetes. Medicines may include: ? Aspirin. ? Medicine to lower cholesterol. ? Medicine to control blood pressure. Your health care provider will set treatment goals for you. Your goals will be based on your age, other medical conditions you have, and how you respond to diabetes treatment. Generally, the goal of treatment is to maintain the   following blood glucose levels:  Before meals: 80-130 mg/dL (4.4-7.2 mmol/L).  After meals: below 180 mg/dL (10 mmol/L).  A1C level: less than 7%. Follow these instructions at home: Questions to ask your health care provider Consider asking the following questions:  Should I meet with a certified  diabetes care and education specialist?  What diabetes medicines do I need, and when should I take them?  What equipment will I need to manage my diabetes at home?  How often do I need to check my blood glucose?  Where can I find a support group for people with diabetes?  What number can I call if I have questions?  When is my next appointment? General instructions  Take over-the-counter and prescription medicines only as told by your health care provider.  Keep all follow-up visits as told by your health care provider. This is important. Where to find more information  American Diabetes Association (ADA): www.diabetes.org  American Association of Diabetes Care and Education Specialists (ADCES): www.diabeteseducator.org  International Diabetes Federation (IDF): www.idf.org Contact a health care provider if:  Your blood glucose is at or above 240 mg/dL (13.3 mmol/L) for 2 days in a row.  You have been sick or have had a fever for 2 days or longer, and you are not getting better.  You have any of the following problems for more than 6 hours: ? You cannot eat or drink. ? You have nausea and vomiting. ? You have diarrhea. Get help right away if:  You have severe hypoglycemia. This means your blood glucose is lower than 54 mg/dL (3.0 mmol/L).  You become confused or you have trouble thinking clearly.  You have difficulty breathing.  You have moderate or large ketone levels in your urine. These symptoms may represent a serious problem that is an emergency. Do not wait to see if the symptoms will go away. Get medical help right away. Call your local emergency services (911 in the [place] Do not drive yourself to the hospital. Summary  Type 2 diabetes (type 2 diabetes mellitus) is a long-term, or chronic, disease. In type 2 diabetes, the pancreas does not make enough of a hormone called insulin, or cells in the body do not respond properly to insulin that the body makes (insulin  resistance).  This condition is treated by making dietary and lifestyle changes and taking diabetes medicines or insulin.  Your health care provider will set treatment goals for you. Your goals will be based on your age, other medical conditions you have, and how you respond to diabetes treatment.  Keep all follow-up visits as told by your health care provider. This is important. This information is not intended to replace advice given to you by your health care provider. Make sure you discuss any questions you have with your health care provider. Document Revised: 01/04/2020 Document Reviewed: 01/04/2020 Elsevier Patient Education  2021 Elsevier Inc.  

## 2020-07-17 NOTE — Progress Notes (Signed)
Subjective:  Patient ID: Dale Anderson, male    DOB: 1930/01/25  Age: 87 y.o. MRN: [id number]  CC: Annual Exam, Hypertension, and Diabetes  This visit occurred during the SARS-CoV-2 public health emergency.  Safety protocols were in place, including screening questions prior to the visit, additional usage of staff PPE, and extensive cleaning of exam room while observing appropriate contact time as indicated for disinfecting solutions.    HPI Dale Anderson presents for a CPX.  He has recently had a few episodes of lightheadedness. He is active and denies ane recent episodes of CP/DOE/palpitations/edema/fatigue.  Outpatient Medications Prior to Visit  Medication Sig Dispense Refill  . ALPRAZolam (XANAX) 0.5 MG tablet TAKE 1 TABLET BY MOUTH DAILY AS NEEDED FOR ANXIETY 30 tablet 3  . atorvastatin (LIPITOR) 20 MG tablet TAKE 1 TABLET BY MOUTH DAILY. 90 tablet 1  . blood glucose meter kit and supplies KIT Use to test blood sugar once daily. DX: E11.9 1 each 0  . Blood Glucose Monitoring Suppl (FREESTYLE LITE) DEVI   0  . Cholecalciferol (VITAMIN D) 1000 UNITS capsule Take 1,000 Units by mouth daily.    . Ciclopirox 0.77 % gel Apply 1 Act topically 2 (two) times daily. 100 g 2  . ELIQUIS 2.5 MG TABS tablet TAKE 1 TABLET (2.5 MG TOTAL) BY MOUTH 2 (TWO) TIMES DAILY. 180 tablet 1  . fluocinonide (LIDEX) 0.05 % external solution APPLY TO THE AFFECTED AREA AS DIRECTED 60 mL 3  . glucose blood (FREESTYLE LITE) test strip USE TO CHECK BLOOD SUGAR ONCE DAILY 100 strip 1  . HUMALOG KWIKPEN 200 UNIT/ML KwikPen INJECT 5 UNITS INTO THE SKIN 3 (THREE) TIMES DAILY WITH MEALS. 6 mL 1  . Insulin Pen Needle (UNIFINE PENTIPS) 31G X 6 MM MISC USE TO INJECT INSULIN FIVE TIMES DAILY 100 each 5  . L-Methylfolate-Algae-B12-B6 3-90.314-2-35 MG CAPS TAKE 1 CAPSULE BY MOUTH 2 (TWO) TIMES DAILY. 60 capsule 5  . Lancets MISC Use to test blood sugar once daily. DX E11.09 100 each 3  . levocetirizine (XYZAL) 5  MG tablet Take 1 tablet (5 mg total) by mouth every evening. 90 tablet 1  . metoprolol succinate (TOPROL XL) 25 MG 24 hr tablet Take 1 tablet (25 mg total) by mouth daily. 90 tablet 3  . Multiple Vitamins-Minerals (CENTRUM SILVER PO) Take by mouth.    . pantoprazole (PROTONIX) 40 MG tablet TAKE 1 TABLET BY MOUTH ONCE DAILY 90 tablet 1  . tamsulosin (FLOMAX) 0.4 MG CAPS capsule TAKE 1 CAPSULE BY MOUTH DAILY. 90 capsule 1  . telmisartan (MICARDIS) 40 MG tablet TAKE 1 TABLET BY MOUTH DAILY. 90 tablet 1  . TRESIBA FLEXTOUCH 200 UNIT/ML FlexTouch Pen INJECT 20 UNITS INTO THE SKIN DAILY. 6 mL 1   No facility-administered medications prior to visit.    ROS Review of Systems  Constitutional: Negative for appetite change, diaphoresis, fatigue and unexpected weight change.  HENT: Negative.   Eyes: Negative for visual disturbance.  Respiratory: Negative for cough, chest tightness, shortness of breath and wheezing.   Cardiovascular: Negative for chest pain, palpitations and leg swelling.  Gastrointestinal: Negative for abdominal pain, constipation, diarrhea, nausea and vomiting.  Endocrine: Negative.   Genitourinary: Negative.  Negative for difficulty urinating.  Musculoskeletal: Negative for back pain and myalgias.  Skin: Negative.   Neurological: Positive for light-headedness. Negative for dizziness, seizures, weakness, numbness and headaches.  Hematological: Negative for adenopathy. Does not bruise/bleed easily.  Psychiatric/Behavioral: Negative.     Objective:  BP (!) 156/88   Pulse 87   Temp 98.2 F (36.8 C) (Oral)   Ht $R'5\' 11"'Hx$  (1.803 m)   Wt 166 lb (75.3 kg)   SpO2 99%   BMI 23.15 kg/m   BP Readings from Last 3 Encounters:  07/17/20 (!) 156/88  03/27/20 (!) 144/82  03/19/20 (!) 144/66    Wt Readings from Last 3 Encounters:  07/17/20 166 lb (75.3 kg)  03/27/20 168 lb (76.2 kg)  03/19/20 165 lb 6 oz (75 kg)    Physical Exam Vitals reviewed.  Constitutional:       Appearance: Normal appearance.  HENT:     Nose: Nose normal.     Mouth/Throat:     Mouth: Mucous membranes are moist.  Eyes:     General: No scleral icterus.    Conjunctiva/sclera: Conjunctivae normal.  Cardiovascular:     Rate and Rhythm: Normal rate and regular rhythm.     Heart sounds: No murmur heard.   Pulmonary:     Effort: Pulmonary effort is normal.     Breath sounds: No wheezing, rhonchi or rales.  Abdominal:     General: Abdomen is flat.     Palpations: There is no mass.     Tenderness: There is no abdominal tenderness. There is no guarding.     Hernia: No hernia is present.  Musculoskeletal:        General: Normal range of motion.     Cervical back: Neck supple.     Right lower leg: No edema.     Left lower leg: No edema.  Lymphadenopathy:     Cervical: No cervical adenopathy.  Skin:    General: Skin is warm and dry.  Neurological:     General: No focal deficit present.     Mental Status: He is alert.  Psychiatric:        Mood and Affect: Mood normal.        Behavior: Behavior normal.     Lab Results  Component Value Date   WBC 6.6 07/17/2020   HGB 13.2 07/17/2020   HCT 39.6 07/17/2020   PLT 209.0 07/17/2020   GLUCOSE 200 (H) 07/17/2020   CHOL 133 01/04/2020   TRIG 85 01/04/2020   HDL 44 01/04/2020   LDLCALC 72 01/04/2020   ALT 19 07/17/2020   AST 18 07/17/2020   NA 139 07/17/2020   K 5.3 (H) 07/17/2020   CL 105 07/17/2020   CREATININE 1.74 (H) 07/17/2020   BUN 33 (H) 07/17/2020   CO2 28 07/17/2020   TSH 2.32 07/17/2020   PSA 2.55 08/21/2015   HGBA1C 8.4 (H) 07/17/2020   MICROALBUR 12.5 (H) 09/22/2019    US RENAL  Result Date: 07/26/2019 CLINICAL DATA:  Chronic kidney disease, stage III A EXAM: RENAL / URINARY TRACT ULTRASOUND COMPLETE COMPARISON:  None. FINDINGS: Right Kidney: Renal measurements: 10.0 x 4.3 x 4.8 cm = volume: 109 mL . Echogenicity within normal limits. No solid mass or hydronephrosis visualized. There are 3 cysts in the  right kidney. The largest is on the upper pole measuring 3 cm. Left Kidney: Renal measurements: 10.4 x 4.7 x 4.5 cm = volume: 116. mL. Echogenicity within normal limits. No mass or hydronephrosis visualized. Bladder: Appears normal for degree of bladder distention. Bilateral ureteral jets identified. Other: Prostate gland measures 4.5 x 3.8 x 5.1 cm with a volume of 46 cubic cm. IMPRESSION: No significant abnormality of the kidneys or bladder. Three benign-appearing cysts on the right kidney. Electronically  Signed   By: Lorriane Shire M.D.   On: 07/26/2019 08:06    Assessment & Plan:   Dale Anderson was seen today for annual exam, hypertension and diabetes.  Diagnoses and all orders for this visit:  Primary hypertension- His BP is well controlled but his K+ is up to 5.3. Will discontinue the ARB. -     CBC with Differential/Platelet; Future -     Basic metabolic panel; Future -     Hepatic function panel; Future -     TSH; Future -     Urinalysis, Routine w reflex microscopic; Future -     Urinalysis, Routine w reflex microscopic -     TSH -     Hepatic function panel -     Basic metabolic panel -     CBC with Differential/Platelet  Type II diabetes mellitus with manifestations ([place])- His A1C is up to 8.4%. Will increase the dose of the basal insulin. -     Basic metabolic panel; Future -     Hemoglobin A1c; Future -     Hemoglobin A1c -     Basic metabolic panel -     insulin degludec (TRESIBA FLEXTOUCH) 200 UNIT/ML FlexTouch Pen; Inject 30 Units into the skin daily. -     Glucagon (GVOKE HYPOPEN 2-PACK) 1 MG/0.2ML SOAJ; Inject 1 Act into the skin daily as needed.  Diabetic polyneuropathy associated with type 2 diabetes mellitus ([place])- Will try to get better control the blood sugars.  Stage 3 chronic kidney disease, unspecified whether stage 3a or 3b CKD ([place])- His renal function is stable. -     CBC with Differential/Platelet; Future -     Basic metabolic panel; Future -     Urinalysis,  Routine w reflex microscopic; Future -     Urinalysis, Routine w reflex microscopic -     Basic metabolic panel -     CBC with Differential/Platelet  Encounter for general adult medical examination with abnormal findings- Exam completed, labs reviewed, no cancer screenings are indicated, vaccines reviewed, pt ed was given.   I have discontinued [name]. Morris's telmisartan. I have also changed his [employer]. Additionally, I am having him start on Gvoke HypoPen 2-Pack. Lastly, I am having him maintain his Vitamin D, blood glucose meter kit and supplies, Lancets, FreeStyle Lite, levocetirizine, tamsulosin, FREESTYLE LITE, Multiple Vitamins-Minerals (CENTRUM SILVER PO), fluocinonide, Ciclopirox, ALPRAZolam, HumaLOG KwikPen, metoprolol succinate, Eliquis, Unifine Pentips, L-Methylfolate-Algae-B12-B6, atorvastatin, and pantoprazole.  Meds ordered this encounter  Medications  . insulin degludec (TRESIBA FLEXTOUCH) 200 UNIT/ML FlexTouch Pen    Sig: Inject 30 Units into the skin daily.    Dispense:  6 mL    Refill:  1  . Glucagon (GVOKE HYPOPEN 2-PACK) 1 MG/0.2ML SOAJ    Sig: Inject 1 Act into the skin daily as needed.    Dispense:  2 mL    Refill:  5     Follow-up: Return in about 4 months (around 11/14/2020).  Scarlette Calico, MD

## 2020-07-18 DIAGNOSIS — Z0001 Encounter for general adult medical examination with abnormal findings: Secondary | ICD-10-CM | POA: Insufficient documentation

## 2020-07-18 MED FILL — TRESIBA FLEXTOUCH 200 UNITS: 200 | 60 days supply | Qty: 9 | Fill #0

## 2020-07-18 MED FILL — GVOKE HYPOPEN 1-PACK 1 MG/0: 1 | 15 days supply | Qty: 0 | Fill #0

## 2020-07-23 ENCOUNTER — Telehealth: Payer: Self-pay | Admitting: Internal Medicine

## 2020-07-23 ENCOUNTER — Other Ambulatory Visit: Payer: Self-pay

## 2020-07-23 ENCOUNTER — Ambulatory Visit (INDEPENDENT_AMBULATORY_CARE_PROVIDER_SITE_OTHER)
Admission: RE | Admit: 2020-07-23 | Discharge: 2020-07-23 | Disposition: A | Discharge status: Home / Self Care | Payer: 59 | Source: Ambulatory Visit | Attending: Internal Medicine | Admitting: Internal Medicine

## 2020-07-23 DIAGNOSIS — R42 Dizziness and giddiness: Secondary | ICD-10-CM

## 2020-07-23 NOTE — Telephone Encounter (Signed)
   Spouse calling to report patient is not better since last office visit/ recent fall. Requesting  order for CT scan of the head. She states Dr Ronnald Ramp suggested getting CT scan which patient declined at that time, but now feels it is needed Please call spouse at [phone]

## 2020-07-23 NOTE — Telephone Encounter (Signed)
Pt's wife has been informed.  

## 2020-07-23 NOTE — Addendum Note (Signed)
Addended by: Binnie Rail on: 07/23/2020 10:38 AM   Modules accepted: Orders

## 2020-07-23 NOTE — Telephone Encounter (Signed)
Ct of head ordered

## 2020-07-25 ENCOUNTER — Telehealth: Payer: Self-pay | Admitting: Pharmacist

## 2020-07-25 NOTE — Telephone Encounter (Signed)
I received a message from patient's daughter-in-law Joaquim Lai (a nurse) regarding some issues with insulin. She reports she watched the patient inject his insulin after he told her he wasn't sure why he never felt the needle. She reports he did not remove the cap before injecting the insulin so the insulin was never injected into his skin. She is not sure how long he has been injecting insulin this way, but assumes it has been since it was prescribed. She demonstrated the correct technique for him and had him watch Youtube videos about correct injection technique. However she is worried insulin dose has been increased over time without the patient ever injecting any insulin.  I share the concern that 30 units of [name] may be too high if the patient has truly not been injecting insulin for several months, especially with history of recent fall. I attempted to contact the patient to discuss using a lower Tresiba dose (10 units to start), hold Humalog, and closely monitor BG to assess results. However I was not able to reach the patient. I asked Joaquim Lai to have patient call the office as soon as possible to come up with an insulin monitoring plan.

## 2020-07-27 ENCOUNTER — Other Ambulatory Visit: Payer: Self-pay | Admitting: Internal Medicine

## 2020-07-27 DIAGNOSIS — R339 Retention of urine, unspecified: Secondary | ICD-10-CM

## 2020-07-27 MED FILL — ELIQUIS 2.5 MG TABLET: 2.5 | 90 days supply | Qty: 180 | Fill #1

## 2020-07-27 MED FILL — TAMSULOSIN HCL 0.4 MG CAP: 0.4 | 90 days supply | Qty: 90 | Fill #0

## 2020-08-01 MED FILL — HUMALOG 200 UNITS/ML KWIKPE: 200 | 80 days supply | Qty: 6 | Fill #1

## 2020-08-06 MED FILL — UNIFINE PENTIPS 6MM 31G: 31G X 6 MM | 20 days supply | Qty: 100 | Fill #2

## 2020-08-08 ENCOUNTER — Ambulatory Visit: Payer: Self-pay | Admitting: *Deleted

## 2020-08-08 NOTE — Patient Instructions (Addendum)
Visit Information  As a part of your Medicare benefit, you are eligible for care management and care coordination services. I was unable to reach you by phone today but would like to speak with you about your health related needs.  I have scheduled an appointment to contact you on Monday August 13, 2020, at 9:15 am  Please call me at [phone] if I can be of assistance to you prior to my scheduled phone call on Monday August 13, 2020  [name], RN, BSN, [place] [phone]

## 2020-08-08 NOTE — Chronic Care Management (AMB) (Signed)
  Chronic Care Management   Outreach Note  08/08/2020 Name: Dale Anderson MRN: [id number] DOB: [birth year]  Referred by: [name], MD Reason for referral : Chronic Care Management (DM; HTN)  An unsuccessful telephone outreach with [name] was attempted today. The patient was referred to the case management team for assistance with care management and care coordination. The patient's wife [name] agreed to schedule a phone call for patient around his upcoming work schedule and this was completed today.  Follow Up Plan: Telephone follow up appointment with care management team member scheduled for: Monday August 13, 2020 at 9:15 am  [name], RN, BSN, [place] [phone]

## 2020-08-10 ENCOUNTER — Telehealth: Payer: Self-pay | Admitting: Pharmacist

## 2020-08-10 NOTE — Progress Notes (Signed)
Chronic Care Management Pharmacy Assistant   Name: Dale Anderson  MRN: [id number] DOB: [birth year]  Reason for Encounter: Initial Questions   PCP : [name], MD  Allergies:   Allergies  Allergen Reactions  . Lisinopril Cough    Medications: Outpatient Encounter Medications as of 08/10/2020  Medication Sig  . ALPRAZolam (XANAX) 0.5 MG tablet TAKE 1 TABLET BY MOUTH DAILY AS NEEDED FOR ANXIETY  . atorvastatin (LIPITOR) 20 MG tablet TAKE 1 TABLET BY MOUTH DAILY.  . blood glucose meter kit and supplies KIT Use to test blood sugar once daily. DX: E11.9  . Blood Glucose Monitoring Suppl (FREESTYLE LITE) DEVI   . Cholecalciferol (VITAMIN D) 1000 UNITS capsule Take 1,000 Units by mouth daily.  . Ciclopirox 0.77 % gel Apply 1 Act topically 2 (two) times daily.  Marland Kitchen ELIQUIS 2.5 MG TABS tablet TAKE 1 TABLET (2.5 MG TOTAL) BY MOUTH 2 (TWO) TIMES DAILY.  . fluocinonide (LIDEX) 0.05 % external solution APPLY TO THE AFFECTED AREA AS DIRECTED  . Glucagon (GVOKE HYPOPEN 2-PACK) 1 MG/0.2ML SOAJ Inject 1 Act into the skin daily as needed.  Marland Kitchen glucose blood (FREESTYLE LITE) test strip USE TO CHECK BLOOD SUGAR ONCE DAILY  . HUMALOG KWIKPEN 200 UNIT/ML KwikPen INJECT 5 UNITS INTO THE SKIN 3 (THREE) TIMES DAILY WITH MEALS.  Marland Kitchen insulin degludec (TRESIBA FLEXTOUCH) 200 UNIT/ML FlexTouch Pen Inject 30 Units into the skin daily.  . Insulin Pen Needle (UNIFINE PENTIPS) 31G X 6 MM MISC USE TO INJECT INSULIN FIVE TIMES DAILY  . L-Methylfolate-Algae-B12-B6 3-90.314-2-35 MG CAPS TAKE 1 CAPSULE BY MOUTH 2 (TWO) TIMES DAILY.  Marland Kitchen Lancets MISC Use to test blood sugar once daily. DX E11.09  . levocetirizine (XYZAL) 5 MG tablet Take 1 tablet (5 mg total) by mouth every evening.  . metoprolol succinate (TOPROL XL) 25 MG 24 hr tablet Take 1 tablet (25 mg total) by mouth daily.  . Multiple Vitamins-Minerals (CENTRUM SILVER PO) Take by mouth.  . pantoprazole (PROTONIX) 40 MG tablet TAKE 1 TABLET BY MOUTH ONCE  DAILY  . tamsulosin (FLOMAX) 0.4 MG CAPS capsule TAKE 1 CAPSULE BY MOUTH DAILY.   No facility-administered encounter medications on file as of 08/10/2020.    Current Diagnosis: Patient Active Problem List   Diagnosis Date Noted  . Encounter for general adult medical examination with abnormal findings 07/18/2020  . Atrial fibrillation by electrocardiogram ([place]) 01/12/2020  . Murmur, cardiac 01/04/2020  . Bradycardia 01/04/2020  . Internal hemorrhoid 12/08/2019  . Tinea cruris 12/05/2019  . Seborrheic dermatitis of scalp 05/05/2019  . Diabetic polyneuropathy associated with type 2 diabetes mellitus ([place]) 03/29/2019  . Seasonal allergic rhinitis due to pollen 05/24/2018  . Sciatica 10/21/2017  . Chronic renal disease, stage 3, moderately decreased glomerular filtration rate (GFR) between 30-59 mL/min/1.73 square meter ([facility]) 07/02/2017  . Routine general medical examination at a health care facility 12/26/2013  . DDD (degenerative disc disease), cervical 12/26/2013  . Hypertension   . Hyperlipidemia with target LDL less than 100   . Type II diabetes mellitus with manifestations ([place])   . GERD (gastroesophageal reflux disease)   . DJD (degenerative joint disease)   . Anxiety     Goals Addressed   None     Follow-Up:  Pharmacist Review   Have you seen any other providers since your last visit? The patient has not seen any other providers since he last saw Dr. Ronnald Ramp  Any changes in your medications or health? The patient states  that he does not have any changes in medications or health  Any side effects from any medications? The patient states that he does not have any side effects from medications  Do you have an symptoms or problems not managed by your medications? The patient does not have any symptoms or problems not managed by medications  Any concerns about your health right now? The patient states that he had a fall a few weeks ago and when he gets up sometimes he feels a  little light headed, not all the time but occassionally   Has your provider asked that you check blood pressure, blood sugar, or follow special diet at home? The patient states that he does not check blood pressure at home and his blood sugar ranges between 125-130, not on any special diet  Do you get any type of exercise on a regular basis? The patient states that he works at [employer] and walks up and down the halls daily  Can you think of a goal you would like to reach for your health? The patient states that he would like to keep blood sugar under control  Do you have any problems getting your medications? The patient stated that his [name] could not be refill because they told him it was to early, but patient states that the vial was empty. But the pharmacy would not refill  Is there anything that you would like to discuss during the appointment? The patient states he would like to discuss the light headedness.   Patient prefers to come into the office for first visit advised to bring medications and supplements to appointment    Dale Anderson, [place] [phone]   Time spent:40

## 2020-08-13 ENCOUNTER — Ambulatory Visit (INDEPENDENT_AMBULATORY_CARE_PROVIDER_SITE_OTHER): Payer: 59 | Admitting: *Deleted

## 2020-08-13 ENCOUNTER — Ambulatory Visit: Payer: 59 | Admitting: *Deleted

## 2020-08-13 ENCOUNTER — Encounter: Payer: Self-pay | Admitting: *Deleted

## 2020-08-13 DIAGNOSIS — I4891 Unspecified atrial fibrillation: Secondary | ICD-10-CM | POA: Diagnosis not present

## 2020-08-13 DIAGNOSIS — E118 Type 2 diabetes mellitus with unspecified complications: Secondary | ICD-10-CM | POA: Diagnosis not present

## 2020-08-13 DIAGNOSIS — I10 Essential (primary) hypertension: Secondary | ICD-10-CM

## 2020-08-13 DIAGNOSIS — E785 Hyperlipidemia, unspecified: Secondary | ICD-10-CM | POA: Diagnosis not present

## 2020-08-13 DIAGNOSIS — N183 Chronic kidney disease, stage 3 unspecified: Secondary | ICD-10-CM | POA: Diagnosis not present

## 2020-08-13 NOTE — Patient Instructions (Addendum)
Visit Information  PATIENT GOALS: Goals Addressed            This Visit's Progress   . Monitor and Manage My Blood Sugar-Diabetes Type 2   On track    Timeframe:  Long-Range Goal Priority:  Medium Start Date:     08/13/20                        Expected End Date:   11/11/20                    Follow Up Date 08/28/20 at 11:30 am . check blood sugar at prescribed times: at least 2-3 times per day; write down all blood sugars and when you are taking your blood sugars . check blood sugar if I feel it is too high or too low . take the blood sugar log to all doctor visits--- call your care providers if you have questions or concerns . Be sure to attend scheduled appointment with Pharmacist Charlene Brooke at Dr. Ronnald Ramp office on August 15, 2020- take your medications with you and write down any questions you have about your medications; ask her if you qualify for a Continuous Glucose Meter (Free-Style [place]) . Great job scheduling your annual eye exam- be sure to attend this appointment, it is very important   Why is this important?    Checking your blood sugar at home helps to keep it from getting very high or very low.   Writing the results in a diary or log helps the doctor know how to care for you.   Your blood sugar log should have the time, date and the results.   Also, write down the amount of insulin or other medicine that you take.   Other information, like what you ate, exercise done and how you were feeling, will also be helpful.     Notes:     . Track and Manage My Blood Pressure-Hypertension   On track    Timeframe:  Long-Range Goal Priority:  Medium Start Date:       08/13/20                      Expected End Date:    11/11/20                   Follow Up Date 08/28/20   . check blood pressure 2- 3 times per week . write blood pressure results in a log or diary  . Continue eating a low-salt, heart healthy diet- this helps manage your diabetes and your blood  pressure    Why is this important?    You won't feel high blood pressure, but it can still hurt your blood vessels.   High blood pressure can cause heart or kidney problems. It can also cause a stroke.   Making lifestyle changes like losing a little weight or eating less salt will help.   Checking your blood pressure at home and at different times of the day can help to control blood pressure.   If the doctor prescribes medicine remember to take it the way the doctor ordered.   Call the office if you cannot afford the medicine or if there are questions about it.           Diabetes Mellitus and Nutrition, Adult When you have diabetes, or diabetes mellitus, it is very important to have healthy eating habits because your blood  sugar (glucose) levels are greatly affected by what you eat and drink. Eating healthy foods in the right amounts, at about the same times every day, can help you:  Control your blood glucose.  Lower your risk of heart disease.  Improve your blood pressure.  Reach or maintain a healthy weight. What can affect my meal plan? Every person with diabetes is different, and each person has different needs for a meal plan. Your health care provider may recommend that you work with a dietitian to make a meal plan that is best for you. Your meal plan may vary depending on factors such as:  The calories you need.  The medicines you take.  Your weight.  Your blood glucose, blood pressure, and cholesterol levels.  Your activity level.  Other health conditions you have, such as heart or kidney disease. How do carbohydrates affect me? Carbohydrates, also called carbs, affect your blood glucose level more than any other type of food. Eating carbs naturally raises the amount of glucose in your blood. Carb counting is a method for keeping track of how many carbs you eat. Counting carbs is important to keep your blood glucose at a healthy level, especially if you use  insulin or take certain oral diabetes medicines. It is important to know how many carbs you can safely have in each meal. This is different for every person. Your dietitian can help you calculate how many carbs you should have at each meal and for each snack. How does alcohol affect me? Alcohol can cause a sudden decrease in blood glucose (hypoglycemia), especially if you use insulin or take certain oral diabetes medicines. Hypoglycemia can be a life-threatening condition. Symptoms of hypoglycemia, such as sleepiness, dizziness, and confusion, are similar to symptoms of having too much alcohol.  Do not drink alcohol if: ? Your health care provider tells you not to drink. ? You are pregnant, may be pregnant, or are planning to become pregnant.  If you drink alcohol: ? Do not drink on an empty stomach. ? Limit how much you use to:  0-1 drink a day for women.  0-2 drinks a day for men. ? Be aware of how much alcohol is in your drink. In the [place] one drink equals one 12 oz bottle of beer (355 mL), one 5 oz glass of wine (148 mL), or one 1 oz glass of hard liquor (44 mL). ? Keep yourself hydrated with water, diet soda, or unsweetened iced tea.  Keep in mind that regular soda, juice, and other mixers may contain a lot of sugar and must be counted as carbs. What are tips for following this plan? Reading food labels  Start by checking the serving size on the "Nutrition Facts" label of packaged foods and drinks. The amount of calories, carbs, fats, and other nutrients listed on the label is based on one serving of the item. Many items contain more than one serving per package.  Check the total grams (g) of carbs in one serving. You can calculate the number of servings of carbs in one serving by dividing the total carbs by 15. For example, if a food has 30 g of total carbs per serving, it would be equal to 2 servings of carbs.  Check the number of grams (g) of saturated fats and trans fats in one  serving. Choose foods that have a low amount or none of these fats.  Check the number of milligrams (mg) of salt (sodium) in one serving. Most people  should limit total sodium intake to less than 2,300 mg per day.  Always check the nutrition information of foods labeled as "low-fat" or "nonfat." These foods may be higher in added sugar or refined carbs and should be avoided.  Talk to your dietitian to identify your daily goals for nutrients listed on the label. Shopping  Avoid buying canned, pre-made, or processed foods. These foods tend to be high in fat, sodium, and added sugar.  Shop around the outside edge of the grocery store. This is where you will most often find fresh fruits and vegetables, bulk grains, fresh meats, and fresh dairy. Cooking  Use low-heat cooking methods, such as baking, instead of high-heat cooking methods like deep frying.  Cook using healthy oils, such as olive, canola, or sunflower oil.  Avoid cooking with butter, cream, or high-fat meats. Meal planning  Eat meals and snacks regularly, preferably at the same times every day. Avoid going long periods of time without eating.  Eat foods that are high in fiber, such as fresh fruits, vegetables, beans, and whole grains. Talk with your dietitian about how many servings of carbs you can eat at each meal.  Eat 4-6 oz (112-168 g) of lean protein each day, such as lean meat, chicken, fish, eggs, or tofu. One ounce (oz) of lean protein is equal to: ? 1 oz (28 g) of meat, chicken, or fish. ? 1 egg. ?  cup (62 g) of tofu.  Eat some foods each day that contain healthy fats, such as avocado, nuts, seeds, and fish.   What foods should I eat? Fruits Berries. Apples. Oranges. Peaches. Apricots. Plums. Grapes. Mango. Papaya. Pomegranate. Kiwi. Cherries. Vegetables Lettuce. Spinach. Leafy greens, including kale, chard, collard greens, and mustard greens. Beets. Cauliflower. Cabbage. Broccoli. Carrots. Green beans.  Tomatoes. Peppers. Onions. Cucumbers. Brussels sprouts. Grains Whole grains, such as whole-wheat or whole-grain bread, crackers, tortillas, cereal, and pasta. Unsweetened oatmeal. Quinoa. Brown or wild rice. Meats and other proteins Seafood. Poultry without skin. Lean cuts of poultry and beef. Tofu. Nuts. Seeds. Dairy Low-fat or fat-free dairy products such as milk, yogurt, and cheese. The items listed above may not be a complete list of foods and beverages you can eat. Contact a dietitian for more information. What foods should I avoid? Fruits Fruits canned with syrup. Vegetables Canned vegetables. Frozen vegetables with butter or cream sauce. Grains Refined white flour and flour products such as bread, pasta, snack foods, and cereals. Avoid all processed foods. Meats and other proteins Fatty cuts of meat. Poultry with skin. Breaded or fried meats. Processed meat. Avoid saturated fats. Dairy Full-fat yogurt, cheese, or milk. Beverages Sweetened drinks, such as soda or iced tea. The items listed above may not be a complete list of foods and beverages you should avoid. Contact a dietitian for more information. Questions to ask a health care provider  Do I need to meet with a diabetes educator?  Do I need to meet with a dietitian?  What number can I call if I have questions?  When are the best times to check my blood glucose? Where to find more information:  American Diabetes Association: diabetes.org  Academy of Nutrition and Dietetics: www.eatright.[employer] of Diabetes and Digestive and Kidney Diseases: [employer]  Association of Diabetes Care and Education Specialists: www.diabeteseducator.org Summary  It is important to have healthy eating habits because your blood sugar (glucose) levels are greatly affected by what you eat and drink.  A healthy meal plan will help you  control your blood glucose and maintain a healthy lifestyle.  Your health care  provider may recommend that you work with a dietitian to make a meal plan that is best for you.  Keep in mind that carbohydrates (carbs) and alcohol have immediate effects on your blood glucose levels. It is important to count carbs and to use alcohol carefully. This information is not intended to replace advice given to you by your health care provider. Make sure you discuss any questions you have with your health care provider. Document Revised: 05/17/2019 Document Reviewed: 05/17/2019 Elsevier Patient Education  2021 [place].    Patient verbalizes understanding of instructions provided today and agrees to view in [place].   Telephone follow up appointment with care management team member scheduled for: August 28, 2020 at 11:30 am by phone  The patient has been provided with contact information for the care management team and has been advised to call with any health related questions or concerns.   [name], RN, BSN, [facility] RN Care Coordination- [place] [phone]

## 2020-08-13 NOTE — Chronic Care Management (AMB) (Signed)
Erroneous additional encounter note- please see encounter note from 08/14/20

## 2020-08-13 NOTE — Chronic Care Management (AMB) (Signed)
Chronic Care Management   CCM RN Visit Note  08/13/2020 Name: Dale Anderson MRN: [id number] DOB: Nov 08, 1929  Subjective: Dale Anderson is a 87 y.o. year old male who is a primary care patient of [name], MD. The care management team was consulted for assistance with disease management and care coordination needs.    Engaged with patient by telephone for initial visit in response to provider referral for case management and/or care coordination services.   Consent to Services:  The patient was given information about Chronic Care Management services, agreed to services, and gave verbal consent prior to initiation of services.  Please see initial visit note for detailed documentation.   Patient agreed to services and verbal consent obtained.   Assessment: Review of patient past medical history, allergies, medications, health status, including review of consultants reports, laboratory and other test data, was performed as part of comprehensive evaluation and provision of chronic care management services.   SDOH (Social Determinants of Health) assessments and interventions performed:  SDOH Interventions   Flowsheet Row Most Recent Value  SDOH Interventions   Food Insecurity Interventions Intervention Not Indicated  Financial Strain Interventions Intervention Not Indicated  Transportation Interventions Intervention Not Indicated      CCM Care Plan  Allergies  Allergen Reactions  . Lisinopril Cough    Outpatient Encounter Medications as of 08/13/2020  Medication Sig Note  . ELIQUIS 2.5 MG TABS tablet TAKE 1 TABLET (2.5 MG TOTAL) BY MOUTH 2 (TWO) TIMES DAILY.   Marland Kitchen HUMALOG KWIKPEN 200 UNIT/ML KwikPen INJECT 5 UNITS INTO THE SKIN 3 (THREE) TIMES DAILY WITH MEALS.   Marland Kitchen ALPRAZolam (XANAX) 0.5 MG tablet TAKE 1 TABLET BY MOUTH DAILY AS NEEDED FOR ANXIETY   . atorvastatin (LIPITOR) 20 MG tablet TAKE 1 TABLET BY MOUTH DAILY.   . blood glucose meter kit and supplies KIT Use to  test blood sugar once daily. DX: E11.9   . Blood Glucose Monitoring Suppl (FREESTYLE LITE) DEVI    . Cholecalciferol (VITAMIN D) 1000 UNITS capsule Take 1,000 Units by mouth daily.   . Ciclopirox 0.77 % gel Apply 1 Act topically 2 (two) times daily.   . fluocinonide (LIDEX) 0.05 % external solution APPLY TO THE AFFECTED AREA AS DIRECTED   . Glucagon (GVOKE HYPOPEN 2-PACK) 1 MG/0.2ML SOAJ Inject 1 Act into the skin daily as needed.   Marland Kitchen glucose blood (FREESTYLE LITE) test strip USE TO CHECK BLOOD SUGAR ONCE DAILY   . insulin degludec (TRESIBA FLEXTOUCH) 200 UNIT/ML FlexTouch Pen Inject 30 Units into the skin daily. (Patient not taking: Reported on 08/13/2020) 08/13/2020: States currently not taking- reports an issue with re-fills due to timing of prescription: states he will discuss with CCM Pharmacist 08/15/20  . Insulin Pen Needle (UNIFINE PENTIPS) 31G X 6 MM MISC USE TO INJECT INSULIN FIVE TIMES DAILY   . L-Methylfolate-Algae-B12-B6 3-90.314-2-35 MG CAPS TAKE 1 CAPSULE BY MOUTH 2 (TWO) TIMES DAILY.   Marland Kitchen Lancets MISC Use to test blood sugar once daily. DX E11.09   . levocetirizine (XYZAL) 5 MG tablet Take 1 tablet (5 mg total) by mouth every evening.   . metoprolol succinate (TOPROL XL) 25 MG 24 hr tablet Take 1 tablet (25 mg total) by mouth daily.   . Multiple Vitamins-Minerals (CENTRUM SILVER PO) Take by mouth.   . pantoprazole (PROTONIX) 40 MG tablet TAKE 1 TABLET BY MOUTH ONCE DAILY   . tamsulosin (FLOMAX) 0.4 MG CAPS capsule TAKE 1 CAPSULE BY MOUTH DAILY.  No facility-administered encounter medications on file as of 08/13/2020.    Patient Active Problem List   Diagnosis Date Noted  . Encounter for general adult medical examination with abnormal findings 07/18/2020  . Atrial fibrillation by electrocardiogram ([place]) 01/12/2020  . Murmur, cardiac 01/04/2020  . Bradycardia 01/04/2020  . Internal hemorrhoid 12/08/2019  . Tinea cruris 12/05/2019  . Seborrheic dermatitis of scalp 05/05/2019  .  Diabetic polyneuropathy associated with type 2 diabetes mellitus ([place]) 03/29/2019  . Seasonal allergic rhinitis due to pollen 05/24/2018  . Sciatica 10/21/2017  . Chronic renal disease, stage 3, moderately decreased glomerular filtration rate (GFR) between 30-59 mL/min/1.73 square meter (HCC) 07/02/2017  . Routine general medical examination at a health care facility 12/26/2013  . DDD (degenerative disc disease), cervical 12/26/2013  . Hypertension   . Hyperlipidemia with target LDL less than 100   . Type II diabetes mellitus with manifestations ([place])   . GERD (gastroesophageal reflux disease)   . DJD (degenerative joint disease)   . Anxiety     Conditions to be addressed/monitored:HTN and DMII  Care Plan : Diabetes Type 2 (Adult)  Updates made by Knox Royalty, RN since 08/13/2020 12:00 AM    Problem: Glycemic Management (Diabetes, Type 2)   Priority: High    Long-Range Goal: Glycemic Management Optimized   Start Date: 08/13/2020  Expected End Date: 11/11/2020  This Visit's Progress: On track  Priority: High  Note:   Objective:  Lab Results  Component Value Date   HGBA1C 8.4 (H) 07/17/2020 .   Lab Results  Component Value Date   CREATININE 1.74 (H) 07/17/2020   CREATININE 1.56 (H) 01/04/2020   CREATININE 1.51 (H) 09/22/2019 .   Marland Kitchen No results found for: EGFR Current Barriers:  Marland Kitchen Knowledge Deficits related to basic Diabetes pathophysiology and self care/management: lacks understanding of significance of monitoring and reaching A1-C target; needs ongoing reeducation/ reinforcement of carb modified diet . Knowledge Deficits related to medications used for management of diabetes: lacks understanding of difference between long and short acting insulins . Knowledge Deficits related to self administration of injectable diabetes medications- recently discovered he was not using long acting insulin correctly and needs reinforcement and follow up with pharmacist; patient had not been  removing cap of long-acting insulin and was not receiving prescribed dose, he has since been unable to re-fill this medication through his outpatient pharmacy Case Manager Clinical Goal(s):  Patient will demonstrate improved adherence to prescribed treatment plan for diabetes self care/management as evidenced by:  . daily monitoring and recording of CBG   . adherence to ADA/ carb modified diet  . adherence to prescribed medication regimen contacting provider for new or worsened symptoms or questions Interventions:  . Collaboration with [name], MD regarding development and update of comprehensive plan of care as evidenced by provider attestation and co-signature . Inter-disciplinary care team collaboration (see longitudinal plan of care); direct collaboration with Charlene Brooke, Pharm D regarding medication management concerns . Provided education to patient about basic DM disease process . Reviewed medications with patient and discussed importance of medication adherence . Discussed plans with patient for ongoing care management follow up and provided patient with direct contact information for care management team . Reviewed scheduled/upcoming provider appointments including: CCM Pharmacist Charlene Brooke on Wednesday August 15, 2020 . Advised patient, providing education and rationale, to check cbg 2-3 times per day and record, calling care providers for concerns or questions . Discussed with patient long-term effects of uncontrolled DM and  confirmed that patient has upcoming scheduled eye exam- positive reinforcement provided; encouraged patient to attend as scheduled Patient Goals/Self-Care Activities UNABLE to independently verbalize A1-C goal and needs reinforcement around use of and purpose of medications for DM . Self administers medications as prescribed . Self administers insulin as prescribed . check blood sugar at prescribed times: at least 2-3 times per day; write  down all blood sugars and when you are taking your blood sugars . check blood sugar if I feel it is too high or too low . take the blood sugar log to all doctor visits--- call your care providers if you have questions or concerns . Be sure to attend scheduled appointment with Pharmacist Charlene Brooke at Dr. Ronnald Ramp office on August 15, 2020- take your medications with you and write down any questions you have about your medications; ask her if you qualify for a Continuous Glucose Meter (Free-Style [place]) . Great job scheduling your annual eye exam- be sure to attend this appointment, it is very important Follow Up Plan:  . Telephone follow up appointment with care management team member scheduled for: Tuesday August 28, 2020 at 11:30 am . The patient has been provided with contact information for the care management team and has been advised to call with any health related questions or concerns.     Care Plan : Hypertension (Adult)  Updates made by Knox Royalty, RN since 08/13/2020 12:00 AM    Problem: Hypertension (Hypertension)   Priority: Medium    Long-Range Goal: Hypertension Monitored   Start Date: 08/13/2020  Expected End Date: 11/11/2020  This Visit's Progress: On track  Priority: Medium  Note:   Objective:  . Last practice recorded BP readings:  BP Readings from Last 3 Encounters:  07/17/20 (!) 156/88  03/27/20 (!) 144/82  03/19/20 (!) 144/66   . Most recent eGFR/CrCl: No results found for: EGFR  No components found for: CRCL Current Barriers:  Marland Kitchen Knowledge Deficits related to basic understanding of hypertension pathophysiology and self care management; patient adhering to medication and prescribed diet for HTN; could benefit from ongoing reinforcement of strategies for blood pressure management . Unable to independently verbalize blood pressures at home and blood pressure target ranges Case Manager Clinical Goal(s):  Marland Kitchen Over the next 90 days, patient will demonstrate  improved adherence to prescribed treatment plan for hypertension as evidenced by taking all medications as prescribed, monitoring and recording blood pressure 2- 3 times per week, adhering to low sodium/DASH diet Interventions:  . Collaboration with [name], MD regarding development and update of comprehensive plan of care as evidenced by provider attestation and co-signature . Inter-disciplinary care team collaboration (see longitudinal plan of care) . Provided education to patient re: DASH diet, complications of uncontrolled blood pressure; how diabetes affects blood pressure . Discussed plans with patient for ongoing care management follow up and provided patient with direct contact information for care management team . Advised patient, providing education and rationale, to monitor blood pressure 2-3 times per week and record, calling care providers for questions or concers.  Patient Goals/Self-Care Activities . Self administers medications as prescribed . Attends all scheduled provider appointments . Calls provider office for new concerns, questions, or BP outside discussed parameters . Follows a low sodium diet/DASH diet . check blood pressure 2- 3 times per week . write blood pressure results in a log or diary  . Continue eating a low-salt, heart healthy diet- this helps manage your diabetes and your blood pressure Follow  Up Plan:  . Telephone follow up appointment with care management team member scheduled for: August 28, 2020 at 11:30 am . The patient has been provided with contact information for the care management team and has been advised to call with any health related questions or concerns.       Plan:Telephone follow up appointment with care management team member scheduled for:  August 28, 2020 at 11:30 am by phone and The patient has been provided with contact information for the care management team and has been advised to call with any health related questions or  concerns.   [name], RN, BSN, [facility] RN Care Coordination- [place] [phone]

## 2020-08-15 ENCOUNTER — Ambulatory Visit: Payer: 59 | Admitting: Pharmacist

## 2020-08-15 ENCOUNTER — Other Ambulatory Visit: Payer: Self-pay | Admitting: Internal Medicine

## 2020-08-15 ENCOUNTER — Other Ambulatory Visit: Payer: Self-pay

## 2020-08-15 DIAGNOSIS — I10 Essential (primary) hypertension: Secondary | ICD-10-CM

## 2020-08-15 DIAGNOSIS — E785 Hyperlipidemia, unspecified: Secondary | ICD-10-CM | POA: Diagnosis not present

## 2020-08-15 DIAGNOSIS — IMO0002 Reserved for concepts with insufficient information to code with codable children: Secondary | ICD-10-CM

## 2020-08-15 DIAGNOSIS — N183 Chronic kidney disease, stage 3 unspecified: Secondary | ICD-10-CM

## 2020-08-15 DIAGNOSIS — E118 Type 2 diabetes mellitus with unspecified complications: Secondary | ICD-10-CM | POA: Diagnosis not present

## 2020-08-15 DIAGNOSIS — E1165 Type 2 diabetes mellitus with hyperglycemia: Secondary | ICD-10-CM

## 2020-08-15 DIAGNOSIS — F419 Anxiety disorder, unspecified: Secondary | ICD-10-CM

## 2020-08-15 DIAGNOSIS — I4891 Unspecified atrial fibrillation: Secondary | ICD-10-CM | POA: Diagnosis not present

## 2020-08-15 DIAGNOSIS — E1121 Type 2 diabetes mellitus with diabetic nephropathy: Secondary | ICD-10-CM

## 2020-08-15 MED ORDER — FREESTYLE LIBRE 2 SENSOR MISC: 1.0000 | Freq: Every day | 5 refills | Status: DC

## 2020-08-15 MED ORDER — FREESTYLE LIBRE 2 READER DEVI: 1.0000 | Freq: Every day | 5 refills | Status: DC

## 2020-08-15 MED FILL — FREESTYLE LIBRE 2 SENSOR MI: 28 days supply | Qty: 2 | Fill #0

## 2020-08-15 MED FILL — FREESTYLE LIBRE 2 READER SY: 1 days supply | Qty: 1 | Fill #0

## 2020-08-15 NOTE — Progress Notes (Signed)
Chronic Care Management Pharmacy Note  08/15/2020 Name:  Dale Anderson MRN:  [id number] DOB:  09-15-1929  Subjective: Dale Anderson is an 87 y.o. year old male who is a primary patient of [name], MD.  The CCM team was consulted for assistance with disease management and care coordination needs.    Engaged with patient face to face for initial visit in response to provider referral for pharmacy case management and/or care coordination services.   Consent to Services:  The patient was given the following information about Chronic Care Management services today, agreed to services, and gave verbal consent: 1. CCM service includes personalized support from designated clinical staff supervised by the primary care provider, including individualized plan of care and coordination with other care providers 2. 24/7 contact phone numbers for assistance for urgent and routine care needs. 3. Service will only be billed when office clinical staff spend 20 minutes or more in a month to coordinate care. 4. Only one practitioner may furnish and bill the service in a calendar month. 5.The patient may stop CCM services at any time (effective at the end of the month) by phone call to the office staff. 6. The patient will be responsible for cost sharing (co-pay) of up to 20% of the service fee (after annual deductible is met). Patient agreed to services and consent obtained.  Patient Care Team: [name], MD as PCP - General (Internal Medicine) Belva Crome, MD as PCP - Cardiology (Cardiology) Alda Berthold, DO as Consulting Physician (Neurology) Charlton Haws, [facility] as Pharmacist (Pharmacist) Knox Royalty, RN as Case Manager  Recent office visits: 07/17/20 Dr Ronnald Ramp OV: chronic f/u, c/o lightheadedness. DC'd ARB d/t K 5.3. A1c up to 8.4, increased Tresiba to 30 units. Rx'd Gvoke. CT scan for recent fall negative for bleed or acute stroke.  Recent consult visits: 05/11/20 Dr  Carolin Sicks (nephrology): pt recently started Eliquis, metoprolol for Afib. No med changes.  Hospital visits: None in previous 6 months  Objective:  Lab Results  Component Value Date   CREATININE 1.74 (H) 07/17/2020   BUN 33 (H) 07/17/2020   GFR 34.65 (L) 07/17/2020   GFRNONAA 56 (L) 05/19/2012   GFRAA 65 (L) 05/19/2012   NA 139 07/17/2020   K 5.3 (H) 07/17/2020   CALCIUM 10.1 07/17/2020   CO2 28 07/17/2020    Lab Results  Component Value Date/Time   HGBA1C 8.4 (H) 07/17/2020 02:23 PM   HGBA1C 7.7 (A) 03/27/2020 01:14 PM   HGBA1C 7.8 (H) 01/04/2020 09:33 AM   HGBA1C 7.1 01/25/2016 12:00 AM   GFR 34.65 (L) 07/17/2020 02:23 PM   GFR 53.11 (L) 09/22/2019 10:34 AM   MICROALBUR 12.5 (H) 09/22/2019 10:34 AM   MICROALBUR 1.8 05/24/2018 11:45 AM    Last diabetic Eye exam:  Lab Results  Component Value Date/Time   HMDIABEYEEXA No Retinopathy 12/10/2018 12:00 AM    Last diabetic Foot exam:  Lab Results  Component Value Date/Time   HMDIABFOOTEX done 08/29/2013 12:00 AM     Lab Results  Component Value Date   CHOL 133 01/04/2020   HDL 44 01/04/2020   LDLCALC 72 01/04/2020   TRIG 85 01/04/2020   CHOLHDL 3.0 01/04/2020    Hepatic Function Latest Ref Rng & Units 07/17/2020 12/22/2018 05/24/2018  Total Protein 6.0 - 8.3 g/dL 7.8 7.5 7.5  Albumin 3.5 - 5.2 g/dL 4.4 4.3 4.2  AST 0 - 37 U/L _0 ALT 0 -  53 U/L _0 Alk Phosphatase 39 - 117 U/L 71 65 62  Total Bilirubin 0.2 - 1.2 mg/dL 1.1 1.0 0.7  Bilirubin, Direct 0.0 - 0.3 mg/dL 0.2 - -    Lab Results  Component Value Date/Time   TSH 2.32 07/17/2020 02:23 PM   TSH 4.28 01/04/2020 09:33 AM    CBC Latest Ref Rng & Units 07/17/2020 01/12/2020 01/04/2020  WBC 4.0 - 10.5 K/uL 6.6 6.7 5.1  Hemoglobin 13.0 - 17.0 g/dL 13.2 11.9(L) 11.6(L)  Hematocrit 39.0 - 52.0 % 39.6 34.9(L) 34.8(L)  Platelets 150.0 - 400.0 K/uL 209.0 202 184    No results found for: VD25OH  Clinical ASCVD: No  The ASCVD Risk score Mikey Bussing DC Jr.,  et al., 2013) failed to calculate for the following reasons:   The 2013 ASCVD risk score is only valid for ages 20 to 43    Depression screen PHQ 2/9 08/13/2020 07/17/2020 02/10/2019  Decreased Interest 0 0 0  Down, Depressed, Hopeless 0 0 0  PHQ - 2 Score 0 0 0    GAD 7 : Generalized Anxiety Score 05/25/2018 11/24/2016  Nervous, Anxious, on Edge 2 0  Control/stop worrying 0 0  Worry too much - different things 1 0  Trouble relaxing 1 0  Restless 1 0  Easily annoyed or irritable 0 0  Afraid - awful might happen 0 0  Total GAD 7 Score 5 0   CHA2DS2-VASc Score = 4  The patient's score is based upon: CHF History: No HTN History: Yes Diabetes History: Yes Stroke History: No Vascular Disease History: No Age Score: 2 Gender Score: 0     Social History   Tobacco Use  Smoking Status Never Smoker  Smokeless Tobacco Never Used   BP Readings from Last 3 Encounters:  07/17/20 (!) 156/88  03/27/20 (!) 144/82  03/19/20 (!) 144/66   Pulse Readings from Last 3 Encounters:  07/17/20 87  03/27/20 63  03/19/20 76   Wt Readings from Last 3 Encounters:  07/17/20 166 lb (75.3 kg)  03/27/20 168 lb (76.2 kg)  03/19/20 165 lb 6 oz (75 kg)    Assessment/Interventions: Review of patient past medical history, allergies, medications, health status, including review of consultants reports, laboratory and other test data, was performed as part of comprehensive evaluation and provision of chronic care management services.   SDOH:  (Social Determinants of Health) assessments and interventions performed: Yes   CCM Care Plan  Allergies  Allergen Reactions  . Lisinopril Cough    Medications Reviewed Today    Reviewed by Charlton Haws, [facility] (Pharmacist) on 08/15/20 at 1428  Med List Status: <None>  Medication Order Taking? Sig Documenting Provider Last Dose Status Informant  ALPRAZolam (XANAX) 0.5 MG tablet [id number] Yes TAKE 1 TABLET BY MOUTH DAILY AS NEEDED FOR ANXIETY [name],  MD Taking Active   atorvastatin (LIPITOR) 20 MG tablet [id number] Yes TAKE 1 TABLET BY MOUTH DAILY. [name], MD Taking Active   blood glucose meter kit and supplies KIT [id number] Yes Use to test blood sugar once daily. DX: E11.9 [name], MD Taking Active   Blood Glucose Monitoring Suppl ([address] [place]) DEVI [id number] Yes  [provider] Taking Active   Cholecalciferol (VITAMIN D) 1000 UNITS capsule [id number] Yes Take 1,000 Units by mouth daily. [provider] Taking Active            Med Note Thurmond Butts, Johny Drilling   Fri Jan 16, 2017 10:06  AM)    ELIQUIS 2.5 MG TABS tablet [id number] Yes TAKE 1 TABLET (2.5 MG TOTAL) BY MOUTH 2 (TWO) TIMES DAILY. [name], MD Taking Active        Patient not taking:      Discontinued 08/15/20 1428 (Completed Course)   Glucagon (GVOKE HYPOPEN 2-PACK) 1 MG/0.2ML SOAJ [id number] Yes Inject 1 Act into the skin daily as needed. [name], MD Taking Active   glucose blood (FREESTYLE LITE) test strip [id number] Yes USE TO CHECK BLOOD SUGAR ONCE DAILY [name], MD Taking Active   HUMALOG KWIKPEN 200 UNIT/ML KwikPen [id number] Yes INJECT 5 UNITS INTO THE SKIN 3 (THREE) TIMES DAILY WITH MEALS. [name], MD Taking Active   insulin degludec (TRESIBA FLEXTOUCH) 200 UNIT/ML FlexTouch Pen [id number] Yes Inject 30 Units into the skin daily. [name], MD Taking Active            Med Note Velva Harman Aug 13, 2020  9:54 AM) States currently not taking- reports an issue with re-fills due to timing of prescription: states he will discuss with CCM Pharmacist 08/15/20  Insulin Pen Needle (UNIFINE PENTIPS) 31G X 6 MM MISC [id number] Yes USE TO INJECT INSULIN FIVE TIMES DAILY [name], MD Taking Active   L-Methylfolate-Algae-B12-B6 3-90.314-2-35 MG CAPS [id number] Yes TAKE 1 CAPSULE BY MOUTH 2 (TWO) TIMES DAILY. [name], MD Taking Active            Med Note Malena Catholic Aug 15, 2020  2:28  PM) Not covered by insurance ~75 out of pocket  Lancets MISC [id number] Yes Use to test blood sugar once daily. DX E11.09 [name], MD Taking Active   metoprolol succinate (TOPROL XL) 25 MG 24 hr tablet [id number] Yes Take 1 tablet (25 mg total) by mouth daily. Belva Crome, MD Taking Active   Multiple Vitamins-Minerals (CENTRUM SILVER PO) [id number] Yes Take by mouth. [provider] Taking Active   pantoprazole (PROTONIX) 40 MG tablet [id number] Yes TAKE 1 TABLET BY MOUTH ONCE DAILY [name], MD Taking Active   tamsulosin (FLOMAX) 0.4 MG CAPS capsule [id number] Yes TAKE 1 CAPSULE BY MOUTH DAILY. [name], MD Taking Active           Patient Active Problem List   Diagnosis Date Noted  . Encounter for general adult medical examination with abnormal findings 07/18/2020  . Atrial fibrillation by electrocardiogram ([place]) 01/12/2020  . Murmur, cardiac 01/04/2020  . Bradycardia 01/04/2020  . Internal hemorrhoid 12/08/2019  . Tinea cruris 12/05/2019  . Seborrheic dermatitis of scalp 05/05/2019  . Diabetic polyneuropathy associated with type 2 diabetes mellitus ([place]) 03/29/2019  . Seasonal allergic rhinitis due to pollen 05/24/2018  . Sciatica 10/21/2017  . Chronic renal disease, stage 3, moderately decreased glomerular filtration rate (GFR) between 30-59 mL/min/1.73 square meter (HCC) 07/02/2017  . Routine general medical examination at a health care facility 12/26/2013  . DDD (degenerative disc disease), cervical 12/26/2013  . Hypertension   . Hyperlipidemia with target LDL less than 100   . Type II diabetes mellitus with manifestations ([place])   . GERD (gastroesophageal reflux disease)   . DJD (degenerative joint disease)   . Anxiety     Immunization History  Administered Date(s) Administered  . Fluad Quad(high Dose 65+) 02/24/2019  . Influenza Split 03/05/2011, 03/23/2012  . Influenza Whole 03/23/2002, 05/16/2009  . Influenza, High Dose Seasonal PF  03/23/2017, 03/27/2018  .  Influenza-Unspecified 03/23/2014, 03/23/2016, 03/23/2020  . PFIZER(Purple Top)SARS-COV-2 Vaccination 06/15/2019, 07/06/2019  . Pneumococcal Conjugate-13 12/26/2013  . Pneumococcal Polysaccharide-23 03/18/2006, 05/24/2018  . Tdap 04/08/2012  . Zoster 08/08/2004    Conditions to be addressed/monitored:  Hypertension, Hyperlipidemia, Diabetes, Atrial Fibrillation, GERD, Chronic Kidney Disease, Anxiety and BPH  Care Plan : [place]  Updates made by Charlton Haws, RPH since 08/15/2020 12:00 AM    Problem: Hypertension, Hyperlipidemia, Diabetes, Atrial Fibrillation, GERD, Chronic Kidney Disease, Anxiety and BPH   Priority: High    Long-Range Goal: Disease management   Start Date: 08/15/2020  Expected End Date: 02/12/2021  This Visit's Progress: On track  Priority: High  Note:   Current Barriers:  . Unable to independently monitor therapeutic efficacy . Unable to achieve control of diabetes  . Suboptimal therapeutic regimen for diabetes  Pharmacist Clinical Goal(s):  Marland Kitchen Over the next 30 days, patient will achieve adherence to monitoring guidelines and medication adherence to achieve therapeutic efficacy . achieve control of diabetes as evidenced by improvement in blood sugar readings . adhere to plan to optimize therapeutic regimen for diabetes as evidenced by report of adherence to recommended medication management changes through collaboration with PharmD and provider.   Interventions: . 1:1 collaboration with [name], MD regarding development and update of comprehensive plan of care as evidenced by provider attestation and co-signature . Inter-disciplinary care team collaboration (see longitudinal plan of care) . Comprehensive medication review performed; medication list updated in electronic medical record  Hypertension (BP goal <140/90) -Not ideally controlled - pt is not checking BP at home and it has been above goal in office  recently -Current treatment: . Metoprolol succinate 25 mg daily -Medications previously tried: chlorthalidone, lisinopril, telmisartan -Current home readings: not checking -Current exercise habits: walks hospital hallways at work -Denies hypotensive/hypertensive symptoms -Educated on BP goals and benefits of medications for prevention of heart attack, stroke and kidney damage; Daily salt intake goal < 2300 mg; Exercise goal of 150 minutes per week; Importance of home blood pressure monitoring; -Counseled to monitor BP at home daily, document, and provide log at future appointments -Recommended to continue current medication  Hyperlipidemia: (LDL goal < 100) -Controlled -Current treatment: . Atorvastatin 20 mg daily -Educated on Cholesterol goals;  Benefits of statin for ASCVD risk reduction; -Recommended to continue current medication  Diabetes (A1c goal <7%) -Uncontrolled -pt is out of [name] due to mix-up and accidentally throwing out his supply, insurance will not pay for it again until 09/04/20. He also was previously not injecting insulin correctly - he was not removing the cap until recently when his daughter-in-law showed him how to properly inject insulin. Over the past week he has only been taking Humalog 5 units with meals (no Tresiba) and BG as been above goal. It is likely that patient will be able to control BG with just basal insulin. -Pt is also interested in [facility]. -Current medications: . Tresiba 200 unit/mL -  30 units daily (not taking) . Humalog 5 units TID w/ meals . Glucagon (Gvoke Hypopen) PRN . Freestyle Lite test strips -Medications previously tried: Synjardy, metformin  -Current home glucose readings . fasting glucose: 151 (pt does not write down is readings and did not have his meter with him today) -Denies hypoglycemic/hyperglycemic symptoms -Educated onA1c and blood sugar goals; Proper insulin injection technique; Benefits of routine  self-monitoring of blood sugar;  -[place] and they report [name] is $49 for 2 month supply through [employer]  insurance - this is cheapest option available for the patient -Recommend trial of basal insulin only - Pt was given Basaglar sample and instructed to inject 10 units daily, and hold Humalog for the time being. -Recommend starting Freestyle Libre CGM (HTA will cover at pharmacy). Will contact patient to set up training session once he gets the device.  Atrial Fibrillation (Goal: prevent stroke and major bleeding) -Controlled -CHADSVASC: 4 -Current treatment: . Rate control: Metoprolol succinate 25 mg daily . Anticoagulation: Eliquis 2.5 mg BID -Counseled on increased risk of stroke due to Afib and benefits of anticoagulation for stroke prevention; importance of adherence to anticoagulant exactly as prescribed; avoidance of NSAIDs due to increased bleeding risk with anticoagulants; seeking medical attention after a head injury or if there is blood in the urine/stool; -Recommended to continue current medication  Anxiety (Goal: manage symptoms) -Controlled -Current treatment: . Alprazolam 0.5 mg PRN - uses seldom -Medications previously tried/failed: none -PHQ9: 0 (07/2020) -GAD7: 5 (05/2018) -Connected with PCP for mental health support -Educated on Benefits of medication for symptom control -Recommended to continue current medication  GERD (Goal: manage symptoms) -Controlled -Current treatment  . Pantoprazole 40 mg daily -Patient is satisfied with current regimen and denies issues; pt has worsening symptoms if he does not take PPI -Recommended to continue current medication  BPH (Goal: manage symptoms, prevent progression) -Controlled -Current treatment  . Tamsulosin 0.4 mg daily -Patient is satisfied with current regimen and denies issues -Recommended to continue current medication  Health Maintenance -Vaccine gaps: covid booster -Current  therapy:  Marland Kitchen Vitamin D 1000 IU daily . L-methylflate-algae-B12-B6 (not covered - $75) . Multivitamin  -Patient is satisfied with current therapy and denies issues -Recommended to continue current medication  Patient Goals/Self-Care Activities . Over the next 30 days, patient will:  - take medications as prescribed focus on medication adherence by routine check glucose 3 times daily, document, and provide at future appointments check blood pressure daily, document, and provide at future appointments  Follow Up Plan: Telephone follow up appointment with care management team member scheduled for: 1 week      Medication Assistance: None required.  Patient affirms current coverage meets needs.  Patient's preferred pharmacy is:  [place], [place] - [address] [facility] [address] [place] [address] Phone: [phone] Fax: [phone]  Uses pill box? No - prefers bottles Pt endorses 100% compliance  We discussed: Current pharmacy is preferred with insurance plan and patient is satisfied with pharmacy services Patient decided to: Continue current medication management strategy  Care Plan and Follow Up Patient Decision:  Patient agrees to Care Plan and Follow-up.  Plan: Telephone follow up appointment with care management team member scheduled for:  1 week  Charlene Brooke, PharmD, [facility] Clinical Pharmacist [facility] at [facility] [phone]

## 2020-08-15 NOTE — Patient Instructions (Addendum)
Visit Information  Phone number for Pharmacist: [phone]  Thank you for meeting with me to discuss your medications! I look forward to working with you to achieve your health care goals. Below is a summary of what we talked about during the visit:  Goals Addressed            This Visit's Progress   . Manage My Medicine       Timeframe:  Long-Range Goal Priority:  High Start Date:      08/15/20                       Expected End Date:  11/12/20                     Follow Up Date 09/20/20   - call for medicine refill 2 or 3 days before it runs out - call if I am sick and can't take my medicine - keep a list of all the medicines I take; vitamins and herbals too  -Use Basaglar 10 units each morning. DO NOT take Humalog with meals for the time being -Check blood sugar 3 times a day (before breakfast, before dinner, and at bedtime). Write down the readings and bring log to future doctor appointments. -Pick up Dale Anderson from the pharmacy and contact Dale Anderson to set up an appointment to learn how to use it   Why is this important?   . These steps will help you keep on track with your medicines.   Notes:         Patient Care Plan: CCM Pharmacy Care Plan    Problem Identified: Hypertension, Hyperlipidemia, Diabetes, Atrial Fibrillation, GERD, Chronic Kidney Disease, Anxiety and BPH   Priority: High    Long-Range Goal: Disease management   Start Date: 08/15/2020  Expected End Date: 02/12/2021  This Visit's Progress: On track  Priority: High  Note:   Current Barriers:  . Unable to independently monitor therapeutic efficacy . Unable to achieve control of diabetes  . Suboptimal therapeutic regimen for diabetes  Pharmacist Clinical Goal(s):  Dale Anderson Kitchen Over the next 30 days, patient will achieve adherence to monitoring guidelines and medication adherence to achieve therapeutic efficacy . achieve control of diabetes as evidenced by improvement in blood sugar readings . adhere to plan  to optimize therapeutic regimen for diabetes as evidenced by report of adherence to recommended medication management changes through collaboration with PharmD and provider.   Interventions: . 1:1 collaboration with Dale Lima, MD regarding development and update of comprehensive plan of care as evidenced by provider attestation and co-signature . Inter-disciplinary care team collaboration (see longitudinal plan of care) . Comprehensive medication review performed; medication list updated in electronic medical record  Hypertension (BP goal <140/90) -Not ideally controlled - pt is not checking BP at home and it has been above goal in office recently -Current treatment: . Metoprolol succinate 25 mg daily -Medications previously tried: chlorthalidone, lisinopril, telmisartan -Current home readings: not checking -Current exercise habits: walks hospital hallways at work -Denies hypotensive/hypertensive symptoms -Educated on BP goals and benefits of medications for prevention of heart attack, stroke and kidney damage; Daily salt intake goal < 2300 mg; Exercise goal of 150 minutes per week; Importance of home blood pressure monitoring; -Counseled to monitor BP at home daily, document, and provide log at future appointments -Recommended to continue current medication  Hyperlipidemia: (LDL goal < 100) -Controlled -Current treatment: . Atorvastatin 20 mg daily -Educated on Cholesterol goals;  Benefits of statin for ASCVD risk reduction; -Recommended to continue current medication  Diabetes (A1c goal <7%) -Uncontrolled -pt is out of [name] due to mix-up and accidentally throwing out his supply, insurance will not pay for it again until 09/04/20. He also was previously not injecting insulin correctly - he was not removing the cap until recently when his daughter-in-law showed him how to properly inject insulin. Over the past week he has only been taking Humalog 5 units with meals (no Tresiba)  and BG as been above goal. It is likely that patient will be able to control BG with just basal insulin. -Pt is also interested in [name]. -Current medications: . Tresiba 200 unit/mL -  30 units daily (not taking) . Humalog 5 units TID w/ meals . Glucagon (Gvoke Hypopen) PRN . Freestyle Lite test strips -Medications previously tried: Synjardy, metformin  -Current home glucose readings . fasting glucose: 151 (pt does not write down is readings and did not have his meter with him today) -Denies hypoglycemic/hyperglycemic symptoms -Educated onA1c and blood sugar goals; Proper insulin injection technique; Benefits of routine self-monitoring of blood sugar;  -Dale Anderson and they report Dale Anderson is $49 for 2 month supply through [name] - this is cheapest option available for the patient -Recommend trial of basal insulin only - Pt was given Basaglar sample and instructed to inject 10 units daily, and hold Humalog for the time being. -Recommend starting Freestyle Libre CGM (HTA will cover at pharmacy). Will contact patient to set up training session once he gets the device.  Atrial Fibrillation (Goal: prevent stroke and major bleeding) -Controlled -CHADSVASC: 4 -Current treatment: . Rate control: Metoprolol succinate 25 mg daily . Anticoagulation: Eliquis 2.5 mg BID -Counseled on increased risk of stroke due to Afib and benefits of anticoagulation for stroke prevention; importance of adherence to anticoagulant exactly as prescribed; avoidance of NSAIDs due to increased bleeding risk with anticoagulants; seeking medical attention after a head injury or if there is blood in the urine/stool; -Recommended to continue current medication  Anxiety (Goal: manage symptoms) -Controlled -Current treatment: . Alprazolam 0.5 mg PRN - uses seldom -Medications previously tried/failed: none -PHQ9: 0 (07/2020) -GAD7: 5 (05/2018) -Connected with PCP for mental  health support -Educated on Benefits of medication for symptom control -Recommended to continue current medication  GERD (Goal: manage symptoms) -Controlled -Current treatment  . Pantoprazole 40 mg daily -Patient is satisfied with current regimen and denies issues; pt has worsening symptoms if he does not take PPI -Recommended to continue current medication  BPH (Goal: manage symptoms, prevent progression) -Controlled -Current treatment  . Tamsulosin 0.4 mg daily -Patient is satisfied with current regimen and denies issues -Recommended to continue current medication  Health Maintenance -Vaccine gaps: covid booster -Current therapy:  Dale Anderson Kitchen Vitamin D 1000 IU daily . L-methylflate-algae-B12-B6 (not covered - $75) . Multivitamin  -Patient is satisfied with current therapy and denies issues -Recommended to continue current medication  Patient Goals/Self-Care Activities . Over the next 30 days, patient will:  - take medications as prescribed focus on medication adherence by routine check glucose 3 times daily, document, and provide at future appointments check blood pressure daily, document, and provide at future appointments  Follow Up Plan: Telephone follow up appointment with care management team member scheduled for: 1 week      Dale Anderson was given information about Chronic Care Management services today including:  1. CCM service includes personalized support from designated clinical staff supervised by his physician,  including individualized plan of care and coordination with other care providers 2. 24/7 contact phone numbers for assistance for urgent and routine care needs. 3. Standard insurance, coinsurance, copays and deductibles apply for chronic care management only during months in which we provide at least 20 minutes of these services. Most insurances cover these services at 100%, however patients may be responsible for any copay, coinsurance and/or deductible if  applicable. This service may help you avoid the need for more expensive face-to-face services. 4. Only one practitioner may furnish and bill the service in a calendar month. 5. The patient may stop CCM services at any time (effective at the end of the month) by phone call to the office staff.  Patient agreed to services and verbal consent obtained.   The patient verbalized understanding of instructions, educational materials, and care plan provided today and agreed to receive a mailed copy of patient instructions, educational materials, and care plan.  Telephone follow up appointment with pharmacy team member scheduled for: 1 week  Charlene Brooke, PharmD, BCACP Clinical Pharmacist [facility] [phone]  Insulin Injection Instructions, Using Insulin Pens, Adult There are many different types of insulin. The type of insulin that you take may determine how many injections you give yourself and when you need to give the injections. Supplies needed:  Soap and water.  Your insulin pen.  A new needle.  Alcohol wipes.  A disposal container for sharp items (sharps container), such as an empty plastic bottle with a cover. How to choose a site for injection The body absorbs insulin differently, depending on where the insulin is injected (injection site). It is best to inject insulin into the same body area each time (for example, always in the abdomen), but you should use a different spot in that area for each injection. Do not inject the insulin in the same spot each time. There are five main areas that can be used for injecting. These areas are:  Abdomen. This is the preferred area.  Front of thigh.  Upper, outer side of thigh.  Upper, outer side of arm.  Upper, outer part of buttock.   How to use an insulin pen Get ready 1. Wash your hands with soap and water. If soap and water are not available, use hand sanitizer. 2. Test your blood sugar (glucose)  level and write down that number. Follow any instructions from your health care provider about what to do if your blood glucose level is higher or lower than your normal range. 3. Check the expiration date and the type of insulin that is in the pen. 4. If you are using CLEAR insulin, check to see that it is clear and free of clumps. 5. If you are using CLOUDY insulin, mix it by gently rolling the insulin pen between your palms several times. Do not shake the pen. 6. Remove the cap from the insulin pen. 7. Use an alcohol wipe to clean the rubber tip of the pen. 8. Remove the protective paper tab from the disposable needle. Do not let the needle touch anything. 9. Screw a new, unused needle onto the pen. 10. Remove the outer plastic needle cover. Do not throw away the outer plastic cover yet. ? If the pen uses a special safety needle, leave the inner needle shield in place. ? If the pen does not use a special safety needle, remove the inner plastic cover from the needle. 11. Follow the manufacturer's instructions to prime the insulin pen  with the volume of insulin needed. Hold the pen with the needle pointing up, and push the button on the opposite end of the pen until a drop of insulin appears at the needle tip. If no insulin appears, repeat this step. 12. Turn the button (dial) to the number of units of insulin that you will be injecting. Inject the insulin 1. Use an alcohol wipe to clean the site where you will be inserting the needle. Let the site air-dry. 2. Hold the pen in the palm of your writing hand like a pencil. 3. If directed by your health care provider, use your other hand to pinch and hold about 1 inch (2.5 cm) of skin at the injection site. Do not directly touch the cleaned part of the skin. 4. Gently but quickly, use your writing hand to put the needle straight into the skin. Insert the needle at a 45-degree angle or a 90-degree angle (perpendicular) to the skin, as directed by your  health care provider. 5. When the needle is completely inserted into the skin, let go of the skin that you are pinching. 6. Use your thumb or index finger of your writing hand to push the top button of the pen all the way to inject the insulin. Continue to hold the pen in place with your writing hand. 7. Wait 10 seconds, then pull the needle straight out of the skin. This will allow all of the insulin to go from the pen and needle into your body. 8. Carefully put the larger (outer) plastic cover of the needle back over the needle, then unscrew the capped needle and discard it in a sharps container, such as an empty plastic bottle with a cover. 9. Put the plastic cap back on the insulin pen.   How to throw away supplies  Discard all used needles in a sharps container.  Follow the disposal regulations for the area where you live. Do not use any needle more than one time.  Throw away empty disposable pens in the regular trash. Questions to ask your health care provider  How often should I be taking insulin?  How often should I check my blood glucose?  What amount of insulin should I be taking at each time?  What are the side effects?  What should I do if my blood glucose is too high?  What should I do if my blood glucose is too low?  What should I do if I forget to take my insulin?  What number should I call if I have questions? Where to find more information  American Diabetes Association (ADA): www.diabetes.org  Association of Diabetes Care and Education Specialists (ADCES): www.diabeteseducator.org Summary  Before you give yourself an insulin injection, be sure to wash your hands and test your blood sugar level. Write down that number.  Check the expiration date and the type of insulin that is in the pen. The type of insulin that you take may determine how many injections you give yourself and when you need to give the injections.  It is best to inject insulin into the same  body area each time (for example, always in the abdomen), but you should use a different spot in that area for each injection.  Do not use a needle more than one time. This information is not intended to replace advice given to you by your health care provider. Make sure you discuss any questions you have with your health care provider. Document Revised: 07/14/2019 Document Reviewed:  07/14/2019 Elsevier Patient Education  2021 [employer].

## 2020-08-16 NOTE — Addendum Note (Signed)
Addended by: Hinda Kehr on: 08/16/2020 07:59 AM   Modules accepted: Orders

## 2020-08-22 ENCOUNTER — Telehealth: Payer: Self-pay | Admitting: Pharmacist

## 2020-08-22 ENCOUNTER — Telehealth: Payer: 59

## 2020-08-22 NOTE — Progress Notes (Deleted)
Chronic Care Management Pharmacy Note  08/22/2020 Name:  Dale Anderson MRN:  [id number] DOB:  08/01/1929  Subjective: Dale Anderson is an 87 y.o. year old male who is a primary patient of [name], MD.  The CCM team was consulted for assistance with disease management and care coordination needs.    Engaged with patient by telephone for follow up visit in response to provider referral for pharmacy case management and/or care coordination services.   Consent to Services:  The patient was given information about Chronic Care Management services, agreed to services, and gave verbal consent prior to initiation of services.  Please see initial visit note for detailed documentation.   Patient Care Team: [name], MD as PCP - General (Internal Medicine) Belva Crome, MD as PCP - Cardiology (Cardiology) Alda Berthold, DO as Consulting Physician (Neurology) Charlton Haws, [facility] as Pharmacist (Pharmacist) Knox Royalty, RN as Case Manager  Recent office visits: 07/17/20 Dr Ronnald Ramp OV: chronic f/u, c/o lightheadedness. DC'd ARB d/t K 5.3. A1c up to 8.4, increased Tresiba to 30 units. Rx'd Gvoke. CT scan for recent fall negative for bleed or acute stroke.  Recent consult visits: 05/11/20 Dr Carolin Sicks (nephrology): pt recently started Eliquis, metoprolol for Afib. No med changes.  Hospital visits: None in previous 6 months  Objective:  Lab Results  Component Value Date   CREATININE 1.74 (H) 07/17/2020   BUN 33 (H) 07/17/2020   GFR 34.65 (L) 07/17/2020   GFRNONAA 56 (L) 05/19/2012   GFRAA 65 (L) 05/19/2012   NA 139 07/17/2020   K 5.3 (H) 07/17/2020   CALCIUM 10.1 07/17/2020   CO2 28 07/17/2020    Lab Results  Component Value Date/Time   HGBA1C 8.4 (H) 07/17/2020 02:23 PM   HGBA1C 7.7 (A) 03/27/2020 01:14 PM   HGBA1C 7.8 (H) 01/04/2020 09:33 AM   HGBA1C 7.1 01/25/2016 12:00 AM   GFR 34.65 (L) 07/17/2020 02:23 PM   GFR 53.11 (L) 09/22/2019 10:34 AM    MICROALBUR 12.5 (H) 09/22/2019 10:34 AM   MICROALBUR 1.8 05/24/2018 11:45 AM    Last diabetic Eye exam:  Lab Results  Component Value Date/Time   HMDIABEYEEXA No Retinopathy 12/10/2018 12:00 AM    Last diabetic Foot exam:  Lab Results  Component Value Date/Time   HMDIABFOOTEX done 08/29/2013 12:00 AM     Lab Results  Component Value Date   CHOL 133 01/04/2020   HDL 44 01/04/2020   LDLCALC 72 01/04/2020   TRIG 85 01/04/2020   CHOLHDL 3.0 01/04/2020    Hepatic Function Latest Ref Rng & Units 07/17/2020 12/22/2018 05/24/2018  Total Protein 6.0 - 8.3 g/dL 7.8 7.5 7.5  Albumin 3.5 - 5.2 g/dL 4.4 4.3 4.2  AST 0 - 37 U/L _0 ALT 0 - 53 U/L _1 Alk Phosphatase 39 - 117 U/L 71 65 62  Total Bilirubin 0.2 - 1.2 mg/dL 1.1 1.0 0.7  Bilirubin, Direct 0.0 - 0.3 mg/dL 0.2 - -    Lab Results  Component Value Date/Time   TSH 2.32 07/17/2020 02:23 PM   TSH 4.28 01/04/2020 09:33 AM    CBC Latest Ref Rng & Units 07/17/2020 01/12/2020 01/04/2020  WBC 4.0 - 10.5 K/uL 6.6 6.7 5.1  Hemoglobin 13.0 - 17.0 g/dL 13.2 11.9(L) 11.6(L)  Hematocrit 39.0 - 52.0 % 39.6 34.9(L) 34.8(L)  Platelets 150.0 - 400.0 K/uL 209.0 202 184    No results found for: VD25OH  Clinical  ASCVD: No  The ASCVD Risk score Mikey Bussing DC Jr., et al., 2013) failed to calculate for the following reasons:   The 2013 ASCVD risk score is only valid for ages 80 to 48    Depression screen PHQ 2/9 08/13/2020 07/17/2020 02/10/2019  Decreased Interest 0 0 0  Down, Depressed, Hopeless 0 0 0  PHQ - 2 Score 0 0 0    GAD 7 : Generalized Anxiety Score 05/25/2018 11/24/2016  Nervous, Anxious, on Edge 2 0  Control/stop worrying 0 0  Worry too much - different things 1 0  Trouble relaxing 1 0  Restless 1 0  Easily annoyed or irritable 0 0  Afraid - awful might happen 0 0  Total GAD 7 Score 5 0   CHA2DS2-VASc Score = 4  The patient's score is based upon: CHF History: No HTN History: Yes Diabetes History: Yes Stroke History:  No Vascular Disease History: No Age Score: 2 Gender Score: 0     Social History   Tobacco Use  Smoking Status Never Smoker  Smokeless Tobacco Never Used   BP Readings from Last 3 Encounters:  07/17/20 (!) 156/88  03/27/20 (!) 144/82  03/19/20 (!) 144/66   Pulse Readings from Last 3 Encounters:  07/17/20 87  03/27/20 63  03/19/20 76   Wt Readings from Last 3 Encounters:  07/17/20 166 lb (75.3 kg)  03/27/20 168 lb (76.2 kg)  03/19/20 165 lb 6 oz (75 kg)    Assessment/Interventions: Review of patient past medical history, allergies, medications, health status, including review of consultants reports, laboratory and other test data, was performed as part of comprehensive evaluation and provision of chronic care management services.   SDOH:  (Social Determinants of Health) assessments and interventions performed: Yes   CCM Care Plan  Allergies  Allergen Reactions  . Lisinopril Cough    Medications Reviewed Today    Reviewed by Charlton Haws, [facility] (Pharmacist) on 08/15/20 at 1428  Med List Status: <None>  Medication Order Taking? Sig Documenting Provider Last Dose Status Informant  ALPRAZolam (XANAX) 0.5 MG tablet [id number] Yes TAKE 1 TABLET BY MOUTH DAILY AS NEEDED FOR ANXIETY [name], MD Taking Active   atorvastatin (LIPITOR) 20 MG tablet [id number] Yes TAKE 1 TABLET BY MOUTH DAILY. [name], MD Taking Active   blood glucose meter kit and supplies KIT [id number] Yes Use to test blood sugar once daily. DX: E11.9 [name], MD Taking Active   Blood Glucose Monitoring Suppl ([address] [place]) DEVI [id number] Yes  [provider] Taking Active   Cholecalciferol (VITAMIN D) 1000 UNITS capsule [id number] Yes Take 1,000 Units by mouth daily. [provider] Taking Active            Med Note Thurmond Butts, CAROLINE E   Fri Jan 16, 2017 10:06 AM)    ELIQUIS 2.5 MG TABS tablet [id number] Yes TAKE 1 TABLET (2.5 MG TOTAL) BY MOUTH 2 (TWO) TIMES DAILY.  [name], MD Taking Active        Patient not taking:      Discontinued 08/15/20 1428 (Completed Course)   Glucagon (GVOKE HYPOPEN 2-PACK) 1 MG/0.2ML SOAJ [id number] Yes Inject 1 Act into the skin daily as needed. [name], MD Taking Active   glucose blood (FREESTYLE LITE) test strip [id number] Yes USE TO CHECK BLOOD SUGAR ONCE DAILY [name], MD Taking Active   HUMALOG KWIKPEN 200 UNIT/ML KwikPen [id number] Yes INJECT 5 UNITS INTO THE SKIN 3 (THREE) TIMES  DAILY WITH MEALS. [name], MD Taking Active   insulin degludec (TRESIBA FLEXTOUCH) 200 UNIT/ML FlexTouch Pen [id number] Yes Inject 30 Units into the skin daily. [name], MD Taking Active            Med Note Velva Harman Aug 13, 2020  9:54 AM) States currently not taking- reports an issue with re-fills due to timing of prescription: states he will discuss with CCM Pharmacist 08/15/20  Insulin Pen Needle (UNIFINE PENTIPS) 31G X 6 MM MISC [id number] Yes USE TO INJECT INSULIN FIVE TIMES DAILY [name], MD Taking Active   L-Methylfolate-Algae-B12-B6 3-90.314-2-35 MG CAPS [id number] Yes TAKE 1 CAPSULE BY MOUTH 2 (TWO) TIMES DAILY. [name], MD Taking Active            Med Note Malena Catholic Aug 15, 2020  2:28 PM) Not covered by insurance ~75 out of pocket  Lancets MISC [id number] Yes Use to test blood sugar once daily. DX E11.09 [name], MD Taking Active   metoprolol succinate (TOPROL XL) 25 MG 24 hr tablet [id number] Yes Take 1 tablet (25 mg total) by mouth daily. Belva Crome, MD Taking Active   Multiple Vitamins-Minerals (CENTRUM SILVER PO) [id number] Yes Take by mouth. [provider] Taking Active   pantoprazole (PROTONIX) 40 MG tablet [id number] Yes TAKE 1 TABLET BY MOUTH ONCE DAILY [name], MD Taking Active   tamsulosin (FLOMAX) 0.4 MG CAPS capsule [id number] Yes TAKE 1 CAPSULE BY MOUTH DAILY. [name], MD Taking Active           Patient Active  Problem List   Diagnosis Date Noted  . Encounter for general adult medical examination with abnormal findings 07/18/2020  . Atrial fibrillation by electrocardiogram ([place]) 01/12/2020  . Murmur, cardiac 01/04/2020  . Bradycardia 01/04/2020  . Internal hemorrhoid 12/08/2019  . Tinea cruris 12/05/2019  . Seborrheic dermatitis of scalp 05/05/2019  . Diabetic polyneuropathy associated with type 2 diabetes mellitus ([place]) 03/29/2019  . Seasonal allergic rhinitis due to pollen 05/24/2018  . Sciatica 10/21/2017  . Chronic renal disease, stage 3, moderately decreased glomerular filtration rate (GFR) between 30-59 mL/min/1.73 square meter (HCC) 07/02/2017  . Routine general medical examination at a health care facility 12/26/2013  . DDD (degenerative disc disease), cervical 12/26/2013  . Hypertension   . Hyperlipidemia with target LDL less than 100   . Type II diabetes mellitus with manifestations ([place])   . GERD (gastroesophageal reflux disease)   . DJD (degenerative joint disease)   . Anxiety     Immunization History  Administered Date(s) Administered  . Fluad Quad(high Dose 65+) 02/24/2019  . Influenza Split 03/05/2011, 03/23/2012  . Influenza Whole 03/23/2002, 05/16/2009  . Influenza, High Dose Seasonal PF 03/23/2017, 03/27/2018  . Influenza-Unspecified 03/23/2014, 03/23/2016, 03/23/2020  . PFIZER(Purple Top)SARS-COV-2 Vaccination 06/15/2019, 07/06/2019  . Pneumococcal Conjugate-13 12/26/2013  . Pneumococcal Polysaccharide-23 03/18/2006, 05/24/2018  . Tdap 04/08/2012  . Zoster 08/08/2004    Conditions to be addressed/monitored:  Hypertension, Hyperlipidemia, Diabetes, Atrial Fibrillation, GERD, Chronic Kidney Disease, Anxiety and BPH  There are no care plans that you recently modified to display for this patient.    Medication Assistance: None required.  Patient affirms current coverage meets needs.  Patient's preferred pharmacy is:  [place],  [place] - [address] [facility]. [address] [place] [address] Phone: [phone] Fax: [phone]  Uses pill box? No - prefers  bottles Pt endorses 100% compliance  We discussed: Current pharmacy is preferred with insurance plan and patient is satisfied with pharmacy services Patient decided to: Continue current medication management strategy  Care Plan and Follow Up Patient Decision:  Patient agrees to Care Plan and Follow-up.  Plan: Telephone follow up appointment with care management team member scheduled for:  1 week  Charlene Brooke, PharmD, [facility] Clinical Pharmacist [facility] at [facility] [phone]

## 2020-08-22 NOTE — Progress Notes (Signed)
Chronic Care Management Pharmacy Assistant   Name: Dale Anderson  MRN: [id number] DOB: 01/30/1930  Reason for Encounter: Diabetic Adherence Call   PCP : [name], MD  Allergies:   Allergies  Allergen Reactions  . Lisinopril Cough    Medications: Outpatient Encounter Medications as of 08/22/2020  Medication Sig Note  . ALPRAZolam (XANAX) 0.5 MG tablet TAKE 1 TABLET BY MOUTH DAILY AS NEEDED FOR ANXIETY   . atorvastatin (LIPITOR) 20 MG tablet TAKE 1 TABLET BY MOUTH DAILY.   . blood glucose meter kit and supplies KIT Use to test blood sugar once daily. DX: E11.9   . Blood Glucose Monitoring Suppl (FREESTYLE LITE) DEVI    . Cholecalciferol (VITAMIN D) 1000 UNITS capsule Take 1,000 Units by mouth daily.   . Continuous Blood Gluc Receiver (FREESTYLE LIBRE 2 READER) DEVI 1 Act by Does not apply route daily.   . Continuous Blood Gluc Sensor (FREESTYLE LIBRE 2 SENSOR) MISC 1 Act by Does not apply route daily.   Marland Kitchen ELIQUIS 2.5 MG TABS tablet TAKE 1 TABLET (2.5 MG TOTAL) BY MOUTH 2 (TWO) TIMES DAILY.   Marland Kitchen Glucagon (GVOKE HYPOPEN 2-PACK) 1 MG/0.2ML SOAJ Inject 1 Act into the skin daily as needed.   Marland Kitchen glucose blood (FREESTYLE LITE) test strip USE TO CHECK BLOOD SUGAR ONCE DAILY   . HUMALOG KWIKPEN 200 UNIT/ML KwikPen INJECT 5 UNITS INTO THE SKIN 3 (THREE) TIMES DAILY WITH MEALS.   Marland Kitchen insulin degludec (TRESIBA FLEXTOUCH) 200 UNIT/ML FlexTouch Pen Inject 30 Units into the skin daily. 08/13/2020: States currently not taking- reports an issue with re-fills due to timing of prescription: states he will discuss with CCM Pharmacist 08/15/20  . Insulin Pen Needle (UNIFINE PENTIPS) 31G X 6 MM MISC USE TO INJECT INSULIN FIVE TIMES DAILY   . L-Methylfolate-Algae-B12-B6 3-90.314-2-35 MG CAPS TAKE 1 CAPSULE BY MOUTH 2 (TWO) TIMES DAILY. 08/15/2020: Not covered by insurance ~75 out of pocket  . Lancets MISC Use to test blood sugar once daily. DX E11.09   . metoprolol succinate (TOPROL XL) 25 MG 24 hr  tablet Take 1 tablet (25 mg total) by mouth daily.   . Multiple Vitamins-Minerals (CENTRUM SILVER PO) Take by mouth.   . pantoprazole (PROTONIX) 40 MG tablet TAKE 1 TABLET BY MOUTH ONCE DAILY   . tamsulosin (FLOMAX) 0.4 MG CAPS capsule TAKE 1 CAPSULE BY MOUTH DAILY.    No facility-administered encounter medications on file as of 08/22/2020.    Current Diagnosis: Patient Active Problem List   Diagnosis Date Noted  . Encounter for general adult medical examination with abnormal findings 07/18/2020  . Atrial fibrillation by electrocardiogram ([place]) 01/12/2020  . Murmur, cardiac 01/04/2020  . Bradycardia 01/04/2020  . Internal hemorrhoid 12/08/2019  . Tinea cruris 12/05/2019  . Seborrheic dermatitis of scalp 05/05/2019  . Diabetic polyneuropathy associated with type 2 diabetes mellitus ([place]) 03/29/2019  . Seasonal allergic rhinitis due to pollen 05/24/2018  . Sciatica 10/21/2017  . Chronic renal disease, stage 3, moderately decreased glomerular filtration rate (GFR) between 30-59 mL/min/1.73 square meter (HCC) 07/02/2017  . Routine general medical examination at a health care facility 12/26/2013  . DDD (degenerative disc disease), cervical 12/26/2013  . Hypertension   . Hyperlipidemia with target LDL less than 100   . Type II diabetes mellitus with manifestations ([place])   . GERD (gastroesophageal reflux disease)   . DJD (degenerative joint disease)   . Anxiety     Goals Addressed   None  Follow-Up:  Pharmacist Review   Recent Relevant Labs: Lab Results  Component Value Date/Time   HGBA1C 8.4 (H) 07/17/2020 02:23 PM   HGBA1C 7.7 (A) 03/27/2020 01:14 PM   HGBA1C 7.8 (H) 01/04/2020 09:33 AM   HGBA1C 7.1 01/25/2016 12:00 AM   MICROALBUR 12.5 (H) 09/22/2019 10:34 AM   MICROALBUR 1.8 05/24/2018 11:45 AM    Kidney Function Lab Results  Component Value Date/Time   CREATININE 1.74 (H) 07/17/2020 02:23 PM   CREATININE 1.56 (H) 01/04/2020 09:33 AM   CREATININE 1.51 (H)  09/22/2019 10:34 AM   GFR 34.65 (L) 07/17/2020 02:23 PM   GFRNONAA 56 (L) 05/19/2012 04:30 PM   GFRAA 65 (L) 05/19/2012 04:30 PM    . Current antihyperglycemic regimen: Patient is taking Shannan Harper  . What recent interventions/DTPs have been made to improve glycemic control: The patient is on a trial sample of basglar  . Have there been any recent hospitalizations or ED visits since last visit with CPP? The patient states that he has not been to the hospital or the ED  . Patient denies hypoglycemic symptoms . Patient denies hyperglycemic symptoms . How often are you checking your blood sugar? The patient states that he checks his blood sugar in the morning and after lunch  . What are your blood sugars ranging?  o Fasting: 171 o Before meals: NA o After meals: 159 o Bedtime: NA . During the week, how often does your blood glucose drop below 70? The patient states that he has not had any readings below 70  . Are you checking your feet daily/regularly? The patient states that sometimes his toes feel numb  Adherence Review: Is the patient currently on a STATIN medication? Yes, atorvastatin Is the patient currently on ACE/ARB medication? No Does the patient have >5 day gap between last estimated fill dates? No   Wendy Poet, [place]   Time spent:26

## 2020-08-28 ENCOUNTER — Ambulatory Visit (INDEPENDENT_AMBULATORY_CARE_PROVIDER_SITE_OTHER): Payer: 59 | Admitting: *Deleted

## 2020-08-28 DIAGNOSIS — I10 Essential (primary) hypertension: Secondary | ICD-10-CM

## 2020-08-28 DIAGNOSIS — E118 Type 2 diabetes mellitus with unspecified complications: Secondary | ICD-10-CM | POA: Diagnosis not present

## 2020-08-28 MED FILL — L-METHYLFOLATE-ALGAE-B12-B6: 3-90.314-2- | 30 days supply | Qty: 60 | Fill #2

## 2020-08-28 NOTE — Patient Instructions (Signed)
Visit Information  [name] talking with you today, Dale Anderson!  Please continue writing down your blood sugars and blood pressures so we can review together when we talk again on Wednesday September 19, 2020 at 11:30 am- please have these ready when I call you.  I have enclosed information about Psychologist, sport and exercise ([place]): please read over these and let me know if you have any questions  PATIENT GOALS: Goals Addressed            This Visit's Progress   . Monitor and Manage My Blood Sugar-Diabetes Type 2   On track    Timeframe:  Long-Range Goal Priority:  Medium Start Date:     08/13/20                        Expected End Date:   11/11/20                    Follow Up Date 09/19/20 at 11:30 am  . Continue checking blood sugars at home 3-4 times per day; write down all blood sugars and when you are taking your blood sugars: please write down what the "fasting (before eating/ morning)" blood sugars are and what the "2 hour after-eating" blood sugars are: these values help your doctor dose your insulin to make sure it is not too much or too little . check blood sugar if I feel it is too high or too low . take the blood sugar log to all doctor visits--- call your care providers if you have questions or concerns . If you have questions about what medications you should be taking, be  let Pharmacist Charlene Brooke at Dr. Ronnald Ramp office     Why is this important?    Checking your blood sugar at home helps to keep it from getting very high or very low.   Writing the results in a diary or log helps the doctor know how to care for you.   Your blood sugar log should have the time, date and the results.   Also, write down the amount of insulin or other medicine that you take.   Other information, like what you ate, exercise done and how you were feeling, will also be helpful.         . Track and Manage My Blood Pressure-Hypertension   On track     Timeframe:  Long-Range Goal Priority:  Medium Start Date:       08/13/20                      Expected End Date:    11/11/20                   Follow Up Date 09/19/20   . Continue checking blood pressure 2- 3 times per week . Write blood pressure results in a log or diary: we will review these next time we talk  . Continue eating a low-salt, heart healthy diet- this helps manage your diabetes and your blood pressure   Why is this important?    You won't feel high blood pressure, but it can still hurt your blood vessels.   High blood pressure can cause heart or kidney problems. It can also cause a stroke.   Making lifestyle changes like losing a little weight or eating less salt will help.   Checking your blood pressure at home and at  different times of the day can help to control blood pressure.   If the doctor prescribes medicine remember to take it the way the doctor ordered.   Call the office if you cannot afford the medicine or if there are questions about it.            Critical care medicine: Principles of diagnosis and management in the adult (4th ed., pp. 4098-1191). [place]."> Miller's anesthesia (8th ed., pp. 232-250). [place].">  Advance Directive  Advance directives are legal documents that allow you to make decisions about your health care and medical treatment in case you become unable to communicate for yourself. Advance directives let your wishes be known to family, friends, and health care providers. Discussing and writing advance directives should happen over time rather than all at once. Advance directives can be changed and updated at any time. There are different types of advance directives, such as:  Medical power of attorney.  Living will.  Do not resuscitate (DNR) order or do not attempt resuscitation (DNAR) order. Health care proxy and medical power of attorney A health care proxy is also called a health care agent. This person is appointed to make  medical decisions for you when you are unable to make decisions for yourself. Generally, people ask a trusted friend or family member to act as their proxy and represent their preferences. Make sure you have an agreement with your trusted person to act as your proxy. A proxy may have to make a medical decision on your behalf if your wishes are not known. A medical power of attorney, also called a durable power of attorney for health care, is a legal document that names your health care proxy. Depending on the laws in your state, the document may need to be:  Signed.  Notarized.  Dated.  Copied.  Witnessed.  Incorporated into your medical record. You may also want to appoint a trusted person to manage your money in the event you are unable to do so. This is called a durable power of attorney for finances. It is a separate legal document from the durable power of attorney for health care. You may choose your health care proxy or someone different to act as your agent in money matters. If you do not appoint a proxy, or there is a concern that the proxy is not acting in your best interest, a court may appoint a guardian to act on your behalf. Living will A living will is a set of instructions that state your wishes about medical care when you cannot express them yourself. Health care providers should keep a copy of your living will in your medical record. You may want to give a copy to family members or friends. To alert caregivers in case of an emergency, you can place a card in your wallet to let them know that you have a living will and where they can find it. A living will is used if you become:  Terminally ill.  Disabled.  Unable to communicate or make decisions. The following decisions should be included in your living will:  To use or not to use life support equipment, such as dialysis machines and breathing machines (ventilators).  Whether you want a DNR or DNAR order. This tells  health care providers not to use cardiopulmonary resuscitation (CPR) if breathing or heartbeat stops.  To use or not to use tube feeding.  To be given or not to be given food and fluids.  Whether you  want comfort (palliative) care when the goal becomes comfort rather than a cure.  Whether you want to donate your organs and tissues. A living will does not give instructions for distributing your money and property if you should pass away. DNR or DNAR A DNR or DNAR order is a request not to have CPR in the event that your heart stops beating or you stop breathing. If a DNR or DNAR order has not been made and shared, a health care provider will try to help any patient whose heart has stopped or who has stopped breathing. If you plan to have surgery, talk with your health care provider about how your DNR or DNAR order will be followed if problems occur. What if I do not have an advance directive? Some states assign family decision makers to act on your behalf if you do not have an advance directive. Each state has its own laws about advance directives. You may want to check with your health care provider, attorney, or state representative about the laws in your state. Summary  Advance directives are legal documents that allow you to make decisions about your health care and medical treatment in case you become unable to communicate for yourself.  The process of discussing and writing advance directives should happen over time. You can change and update advance directives at any time.  Advance directives may include a medical power of attorney, a living will, and a DNR or DNAR order. This information is not intended to replace advice given to you by your health care provider. Make sure you discuss any questions you have with your health care provider. Document Revised: 03/13/2020 Document Reviewed: 03/13/2020 Elsevier Patient Education  2021 [place].   Patient verbalizes understanding of  instructions provided today and agrees to view in [place].    Telephone follow up appointment with care management team member scheduled for: September 19, 2020 at 11:30 am  The patient has been provided with contact information for the care management team and has been advised to call with any health related questions or concerns.   [name], RN, BSN, [facility] RN Care Coordination- [place] [phone]: direct office [phone]: Susette Racer

## 2020-08-28 NOTE — Chronic Care Management (AMB) (Signed)
Chronic Care Management   CCM RN Visit Note  08/28/2020 Name: Dale Anderson MRN: [id number] DOB: [birth year]  Subjective: Dale Anderson is a 87 y.o. year old male who is a primary care patient of Dale Lima, MD. The care management team was consulted for assistance with disease management and care coordination needs.    Engaged with patient by telephone for follow up visit in response to provider referral for case management and/or care coordination services.   Consent to Services:  The patient was given information about Chronic Care Management services, agreed to services, and gave verbal consent prior to initiation of services.  Please see initial visit note for detailed documentation.   Patient agreed to services and verbal consent obtained.   Assessment: Review of patient past medical history, allergies, medications, health status, including review of consultants reports, laboratory and other test data, was performed as part of comprehensive evaluation and provision of chronic care management services.   SDOH (Social Determinants of Health) assessments and interventions performed:    CCM Care Plan  Allergies  Allergen Reactions  . Lisinopril Cough    Outpatient Encounter Medications as of 08/28/2020  Medication Sig Note  . Insulin Glargine (BASAGLAR KWIKPEN) 100 UNIT/ML Inject 10 Units into the skin daily. Patient reports this was started 08/15/20 by CCM Pharmacist   . ALPRAZolam (XANAX) 0.5 MG tablet TAKE 1 TABLET BY MOUTH DAILY AS NEEDED FOR ANXIETY   . atorvastatin (LIPITOR) 20 MG tablet TAKE 1 TABLET BY MOUTH DAILY.   . blood glucose meter kit and supplies KIT Use to test blood sugar once daily. DX: E11.9   . Blood Glucose Monitoring Suppl (FREESTYLE LITE) DEVI    . Cholecalciferol (VITAMIN D) 1000 UNITS capsule Take 1,000 Units by mouth daily.   . Continuous Blood Gluc Receiver (FREESTYLE LIBRE 2 READER) DEVI 1 Act by Does not apply route daily.   . Continuous  Blood Gluc Sensor (FREESTYLE LIBRE 2 SENSOR) MISC 1 Act by Does not apply route daily.   Marland Kitchen ELIQUIS 2.5 MG TABS tablet TAKE 1 TABLET (2.5 MG TOTAL) BY MOUTH 2 (TWO) TIMES DAILY.   Marland Kitchen Glucagon (GVOKE HYPOPEN 2-PACK) 1 MG/0.2ML SOAJ Inject 1 Act into the skin daily as needed.   Marland Kitchen glucose blood (FREESTYLE LITE) test strip USE TO CHECK BLOOD SUGAR ONCE DAILY   . HUMALOG KWIKPEN 200 UNIT/ML KwikPen INJECT 5 UNITS INTO THE SKIN 3 (THREE) TIMES DAILY WITH MEALS. (Patient not taking: Reported on 08/28/2020) 08/28/2020: Patient reports not taking after recommendation from [facility] Pharmacist 08/15/20  . insulin degludec (TRESIBA FLEXTOUCH) 200 UNIT/ML FlexTouch Pen Inject 30 Units into the skin daily. (Patient not taking: Reported on 08/28/2020) 08/28/2020: 08/28/20: Patient reports currently not taking; states he "thinks" he is supposed to re-start this medication on 09/04/20 per instructions from [facility] Pharmacist 08/15/20   . Insulin Pen Needle (UNIFINE PENTIPS) 31G X 6 MM MISC USE TO INJECT INSULIN FIVE TIMES DAILY   . L-Methylfolate-Algae-B12-B6 3-90.314-2-35 MG CAPS TAKE 1 CAPSULE BY MOUTH 2 (TWO) TIMES DAILY. 08/15/2020: Not covered by insurance ~75 out of pocket  . Lancets MISC Use to test blood sugar once daily. DX E11.09   . metoprolol succinate (TOPROL XL) 25 MG 24 hr tablet Take 1 tablet (25 mg total) by mouth daily.   . Multiple Vitamins-Minerals (CENTRUM SILVER PO) Take by mouth.   . pantoprazole (PROTONIX) 40 MG tablet TAKE 1 TABLET BY MOUTH ONCE DAILY   . tamsulosin (FLOMAX) 0.4 MG CAPS capsule  TAKE 1 CAPSULE BY MOUTH DAILY.    No facility-administered encounter medications on file as of 08/28/2020.    Patient Active Problem List   Diagnosis Date Noted  . Encounter for general adult medical examination with abnormal findings 07/18/2020  . Atrial fibrillation by electrocardiogram ([place]) 01/12/2020  . Murmur, cardiac 01/04/2020  . Bradycardia 01/04/2020  . Internal hemorrhoid 12/08/2019  . Tinea cruris  12/05/2019  . Seborrheic dermatitis of scalp 05/05/2019  . Diabetic polyneuropathy associated with type 2 diabetes mellitus ([place]) 03/29/2019  . Seasonal allergic rhinitis due to pollen 05/24/2018  . Sciatica 10/21/2017  . Chronic renal disease, stage 3, moderately decreased glomerular filtration rate (GFR) between 30-59 mL/min/1.73 square meter (HCC) 07/02/2017  . Routine general medical examination at a health care facility 12/26/2013  . DDD (degenerative disc disease), cervical 12/26/2013  . Hypertension   . Hyperlipidemia with target LDL less than 100   . Type II diabetes mellitus with manifestations ([place])   . GERD (gastroesophageal reflux disease)   . DJD (degenerative joint disease)   . Anxiety     Conditions to be addressed/monitored:HTN and DMII  Care Plan : Diabetes Type 2 (Adult)  Updates made by Dale Royalty, RN since 08/28/2020 12:00 AM    Problem: Glycemic Management (Diabetes, Type 2)   Priority: High    Long-Range Goal: Glycemic Management Optimized   Start Date: 08/13/2020  Expected End Date: 11/11/2020  Recent Progress: On track  Priority: High  Note:   Objective:  Lab Results  Component Value Date   HGBA1C 8.4 (H) 07/17/2020 .   Lab Results  Component Value Date   CREATININE 1.74 (H) 07/17/2020   CREATININE 1.56 (H) 01/04/2020   CREATININE 1.51 (H) 09/22/2019 .   Marland Kitchen No results found for: EGFR Current Barriers:  Marland Kitchen Knowledge Deficits related to basic Diabetes pathophysiology and self care/management: lacks understanding of significance of monitoring and reaching A1-C target; needs ongoing reeducation/ reinforcement of carb modified diet . Knowledge Deficits related to medications used for management of diabetes: lacks understanding of difference between long and short acting insulins . Knowledge Deficits related to self administration of injectable diabetes medications- recently discovered he was not using long acting insulin correctly and needs reinforcement  and follow up with pharmacist; patient had not been removing cap of long-acting insulin and was not receiving prescribed dose, he has since been unable to re-fill this medication through his outpatient pharmacy: patient reports 08/28/20, this has been resolved Case Manager Clinical Goal(s):  Over the next 90 days, patient will demonstrate improved adherence to prescribed treatment plan for diabetes self care/management as evidenced by:  . daily monitoring and recording of CBG 3-4 times per day  . adherence to ADA/ carb modified diet  . adherence to prescribed medication regimen contacting provider for new or worsened symptoms or questions Interventions:  . Collaboration with Dale Lima, MD regarding development and update of comprehensive plan of care as evidenced by provider attestation and co-signature . Inter-disciplinary care team collaboration (see longitudinal plan of care); direct collaboration with Charlene Brooke, Pharm D regarding medication management concerns . Reviewed medications with patient post- recent CCM Pharmacist visit and updated EHR accordingly; discussed importance of medication adherence, confirmed patient has contact information for CCM Pharmacist and encouraged him to maintain contact with CCM Pharmacist for any medication questions/ issues . Discussed plans with patient for ongoing care management follow up and confirmed patient has direct contact information for care management team . Confirmed patient has been  monitoring/ recording blood sugars 3-4 times per day using newly acquired FSL/ CGM: reviewed with patient recent blood sugars at home today; he reports fasting ranges consistently between 116-170 and post- prandial values consistently between 180-200: confirmed today no recent signs/ symptoms/ values < 100 Patient Goals/Self-Care Activities UNABLE to independently verbalize A1-C goal and needs reinforcement around use of and purpose of medications for DM . Self  administers medications as prescribed . Self administers insulin as prescribed . Continue checking blood sugars at home 3-4 times per day; write down all blood sugars and when you are taking your blood sugars: please write down what the "fasting (before eating/ morning)" blood sugars are and what the "2 hour after-eating" blood sugars are: these values help your doctor dose your insulin to make sure it is not too much or too little . check blood sugar if I feel it is too high or too low . take the blood sugar log to all doctor visits--- call your care providers if you have questions or concerns . If you have questions about what medications you should be taking, be  let Pharmacist Charlene Brooke at Dr. Ronnald Ramp office  Follow Up Plan:  . Telephone follow up appointment with care management team member scheduled for: Wednesday September 19, 2020 at 11:30 am . The patient has been provided with contact information for the care management team and has been advised to call with any health related questions or concerns.     Care Plan : Hypertension (Adult)  Updates made by Dale Royalty, RN since 08/28/2020 12:00 AM    Problem: Hypertension (Hypertension)   Priority: Medium    Long-Range Goal: Hypertension Monitored   Start Date: 08/13/2020  Expected End Date: 11/11/2020  This Visit's Progress: On track  Recent Progress: On track  Priority: Medium  Note:   Objective:  . Last practice recorded BP readings:  BP Readings from Last 3 Encounters:  07/17/20 (!) 156/88  03/27/20 (!) 144/82  03/19/20 (!) 144/66   . Most recent eGFR/CrCl: No results found for: EGFR  No components found for: CRCL Current Barriers:  Marland Kitchen Knowledge Deficits related to basic understanding of hypertension pathophysiology and self care management; patient adhering to medication and prescribed diet for HTN; could benefit from ongoing reinforcement of strategies for blood pressure management . Unable to independently verbalize  blood pressures at home and blood pressure target ranges Case Manager Clinical Goal(s):  Marland Kitchen Over the next 90 days, patient will demonstrate improved adherence to prescribed treatment plan for hypertension as evidenced by taking all medications as prescribed, monitoring and recording blood pressure 2- 3 times per week, adhering to low sodium/DASH diet Interventions:  . Collaboration with Dale Lima, MD regarding development and update of comprehensive plan of care as evidenced by provider attestation and co-signature . Inter-disciplinary care team collaboration (see longitudinal plan of care) . Reinforced previously provided education to patient re: DASH diet, complications of uncontrolled blood pressure; how diabetes affects blood pressure: patient requires ongoing reinforcement . Discussed plans with patient for ongoing care management follow up and provided patient with direct contact information for care management team . Advised patient, providing education and rationale, to monitor blood pressure 2-3 times per week and record, calling care providers for questions or concerns: attempted to review recently recorded blood pressures with patient, however, he declines same; states he does not have his BP log with him- he verbally agrees to have at time of next scheduled call; positive reinforcement provided to  patient for initiating regular blood pressure monitoring at home . Confirmed no recent changes to BP medications, patient continues taking as prescribed . Discussed Advanced Directives with patient and provided written educational material for his review Patient Goals/Self-Care Activities . Self administers medications as prescribed . Attends all scheduled provider appointments . Calls provider office for new concerns, questions, or BP outside discussed parameters . Follows a low sodium diet/DASH diet . Continue checking blood pressure 2- 3 times per week . Write blood pressure results in  a log or diary: we will review these next time we talk  . Continue eating a low-salt, heart healthy diet- this helps manage your diabetes and your blood pressure Follow Up Plan:  . Telephone follow up appointment with care management team member scheduled for: September 19, 2020 at 11:30 am . The patient has been provided with contact information for the care management team and has been advised to call with any health related questions or concerns.       Plan:  Telephone follow up appointment with care management team member scheduled for:  September 19, 2020 at 11:30 am  The patient has been provided with contact information for the care management team and has been advised to call with any health related questions or concerns.   [name], RN, BSN, [facility] RN Care Coordination- [place] [phone]: direct office [phone]: mobile

## 2020-09-17 ENCOUNTER — Telehealth: Payer: Self-pay | Admitting: Pharmacist

## 2020-09-17 NOTE — Progress Notes (Signed)
    Chronic Care Management Pharmacy Assistant   Name: Dale Anderson  MRN: [id number] DOB: [birth year]   Reason for Encounter: Chart Review    Medications: Outpatient Encounter Medications as of 09/17/2020  Medication Sig Note  . ALPRAZolam (XANAX) 0.5 MG tablet TAKE 1 TABLET BY MOUTH DAILY AS NEEDED FOR ANXIETY   . atorvastatin (LIPITOR) 20 MG tablet TAKE 1 TABLET BY MOUTH DAILY.   . blood glucose meter kit and supplies KIT Use to test blood sugar once daily. DX: E11.9   . Blood Glucose Monitoring Suppl (FREESTYLE LITE) DEVI    . Cholecalciferol (VITAMIN D) 1000 UNITS capsule Take 1,000 Units by mouth daily.   . Continuous Blood Gluc Receiver (FREESTYLE LIBRE 2 READER) DEVI 1 Act by Does not apply route daily.   . Continuous Blood Gluc Sensor (FREESTYLE LIBRE 2 SENSOR) MISC 1 Act by Does not apply route daily.   Marland Kitchen ELIQUIS 2.5 MG TABS tablet TAKE 1 TABLET (2.5 MG TOTAL) BY MOUTH 2 (TWO) TIMES DAILY.   Marland Kitchen Glucagon (GVOKE HYPOPEN 2-PACK) 1 MG/0.2ML SOAJ Inject 1 Act into the skin daily as needed.   Marland Kitchen glucose blood (FREESTYLE LITE) test strip USE TO CHECK BLOOD SUGAR ONCE DAILY   . HUMALOG KWIKPEN 200 UNIT/ML KwikPen INJECT 5 UNITS INTO THE SKIN 3 (THREE) TIMES DAILY WITH MEALS. (Patient not taking: Reported on 08/28/2020) 08/28/2020: Patient reports not taking after recommendation from [facility] Pharmacist 08/15/20  . insulin degludec (TRESIBA FLEXTOUCH) 200 UNIT/ML FlexTouch Pen Inject 30 Units into the skin daily. (Patient not taking: Reported on 08/28/2020) 08/28/2020: 08/28/20: Patient reports currently not taking; states he "thinks" he is supposed to re-start this medication on 09/04/20 per instructions from [facility] Pharmacist 08/15/20   . Insulin Glargine (BASAGLAR KWIKPEN) 100 UNIT/ML Inject 10 Units into the skin daily. Patient reports this was started 08/15/20 by CCM Pharmacist   . Insulin Pen Needle (UNIFINE PENTIPS) 31G X 6 MM MISC USE TO INJECT INSULIN FIVE TIMES DAILY   .  L-Methylfolate-Algae-B12-B6 3-90.314-2-35 MG CAPS TAKE 1 CAPSULE BY MOUTH 2 (TWO) TIMES DAILY. 08/15/2020: Not covered by insurance ~75 out of pocket  . Lancets MISC Use to test blood sugar once daily. DX E11.09   . metoprolol succinate (TOPROL XL) 25 MG 24 hr tablet Take 1 tablet (25 mg total) by mouth daily.   . Multiple Vitamins-Minerals (CENTRUM SILVER PO) Take by mouth.   . pantoprazole (PROTONIX) 40 MG tablet TAKE 1 TABLET BY MOUTH ONCE DAILY   . tamsulosin (FLOMAX) 0.4 MG CAPS capsule TAKE 1 CAPSULE BY MOUTH DAILY.    No facility-administered encounter medications on file as of 09/17/2020.    Reviewed chart for medication changes and adherence.    No gaps in adherence identified. Patient has follow up scheduled with pharmacy team. No further action required.   [place] [phone]

## 2020-09-19 ENCOUNTER — Telehealth: Payer: Self-pay | Admitting: *Deleted

## 2020-09-19 ENCOUNTER — Telehealth: Payer: 59

## 2020-09-19 NOTE — Telephone Encounter (Signed)
  Chronic Care Management   Outreach Note  09/19/2020 Name: Dale Anderson MRN: [id number] DOB: 08-23-1929  Referred by: [name], MD Reason for referral : Chronic Care Management (DM; HTN: Unsuccessful outreach attempt)  An unsuccessful telephone outreach was attempted today for previously scheduled telephone follow up visit. The patient was referred to the case management team for assistance with care management and care coordination.   Follow Up Plan:   The patient has previously been provided with contact information for the care management team and has been advised to call with any health related questions or concerns.   A HIPAA compliant phone message was not left for the patient providing contact information and requesting a return call: with each call attempt, patient's phone rang without physical or voice mail pick up- unable to leave patient HIPAA compliant message requesting call-back  The care management team will reach out to the patient to re-schedule: have requested care guide team to re-schedule.   [name], RN, BSN, [facility] RN Care Coordination- [place] [phone]: direct office [phone]: mobile

## 2020-09-20 ENCOUNTER — Telehealth: Payer: Self-pay | Admitting: *Deleted

## 2020-09-20 MED FILL — FREESTYLE LIBRE 2 SENSOR MI: 28 days supply | Qty: 2 | Fill #1

## 2020-09-20 MED FILL — METOPROLOL SUCCINATE ER 25: 25 | 90 days supply | Qty: 90 | Fill #2

## 2020-09-20 NOTE — Chronic Care Management (AMB) (Signed)
  Care Management   Note  09/20/2020 Name: Dale Anderson MRN: [id number] DOB: Feb 23, 1930  Dale Anderson is a 87 y.o. year old male who is a primary care patient of [name], MD and is actively engaged with the care management team. I reached out to [name] by phone today to assist with re-scheduling a follow up visit with the RN Case Manager  Follow up plan: Unsuccessful telephone outreach attempt made. The care management team will reach out to the patient again over the next 7 days.  If patient returns call to provider office, please advise to call Kirvin Lysle Morales at [facility]

## 2020-09-24 ENCOUNTER — Other Ambulatory Visit (HOSPITAL_COMMUNITY): Payer: Self-pay

## 2020-09-24 ENCOUNTER — Ambulatory Visit (INDEPENDENT_AMBULATORY_CARE_PROVIDER_SITE_OTHER): Payer: 59 | Admitting: Pharmacist

## 2020-09-24 ENCOUNTER — Other Ambulatory Visit: Payer: Self-pay

## 2020-09-24 DIAGNOSIS — E118 Type 2 diabetes mellitus with unspecified complications: Secondary | ICD-10-CM

## 2020-09-24 DIAGNOSIS — E785 Hyperlipidemia, unspecified: Secondary | ICD-10-CM

## 2020-09-24 DIAGNOSIS — I10 Essential (primary) hypertension: Secondary | ICD-10-CM

## 2020-09-24 DIAGNOSIS — N183 Chronic kidney disease, stage 3 unspecified: Secondary | ICD-10-CM

## 2020-09-24 DIAGNOSIS — I4891 Unspecified atrial fibrillation: Secondary | ICD-10-CM

## 2020-09-24 MED ORDER — INSULIN GLARGINE-YFGN 100 UNIT/ML ~~LOC~~ SOPN
10.0000 [IU] | PEN_INJECTOR | Freq: Every day | SUBCUTANEOUS | 3 refills | Status: DC
  Filled 2020-09-24: qty 9, 90d supply, fill #0
  Filled 2020-09-25: qty 9, 84d supply, fill #0
  Filled 2020-09-25: qty 9, 90d supply, fill #0
  Filled 2020-12-19: qty 9, 84d supply, fill #1

## 2020-09-24 NOTE — Progress Notes (Signed)
Chronic Care Management Pharmacy Note  09/24/2020 Name:  Dale Anderson MRN:  [id number] DOB:  [birth year]  Subjective: Dale Anderson is an 87 y.o. year old male who is a primary patient of [name], MD.  The CCM team was consulted for assistance with disease management and care coordination needs.    Engaged with patient face to face for follow up visit in response to provider referral for pharmacy case management and/or care coordination services.   Consent to Services:  The patient was given information about Chronic Care Management services, agreed to services, and gave verbal consent prior to initiation of services.  Please see initial visit note for detailed documentation.   Patient Care Team: [name], MD as PCP - General (Internal Medicine) Belva Crome, MD as PCP - Cardiology (Cardiology) Alda Berthold, DO as Consulting Physician (Neurology) Charlton Haws, [facility] as Pharmacist (Pharmacist) Knox Royalty, RN as Case Manager  Recent office visits: 07/17/20 Dr Ronnald Ramp OV: chronic f/u, c/o lightheadedness. DC'd ARB d/t K 5.3. A1c up to 8.4, increased Tresiba to 30 units. Rx'd Gvoke. CT scan for recent fall negative for bleed or acute stroke.  Recent consult visits: 05/11/20 Dr Carolin Sicks (nephrology): pt recently started Eliquis, metoprolol for Afib. No med changes.  Hospital visits: None in previous 6 months  Objective:  Lab Results  Component Value Date   CREATININE 1.74 (H) 07/17/2020   BUN 33 (H) 07/17/2020   GFR 34.65 (L) 07/17/2020   GFRNONAA 56 (L) 05/19/2012   GFRAA 65 (L) 05/19/2012   NA 139 07/17/2020   K 5.3 (H) 07/17/2020   CALCIUM 10.1 07/17/2020   CO2 28 07/17/2020    Lab Results  Component Value Date/Time   HGBA1C 8.4 (H) 07/17/2020 02:23 PM   HGBA1C 7.7 (A) 03/27/2020 01:14 PM   HGBA1C 7.8 (H) 01/04/2020 09:33 AM   HGBA1C 7.1 01/25/2016 12:00 AM   GFR 34.65 (L) 07/17/2020 02:23 PM   GFR 53.11 (L) 09/22/2019 10:34 AM    MICROALBUR 12.5 (H) 09/22/2019 10:34 AM   MICROALBUR 1.8 05/24/2018 11:45 AM    Last diabetic Eye exam:  Lab Results  Component Value Date/Time   HMDIABEYEEXA No Retinopathy 12/10/2018 12:00 AM    Last diabetic Foot exam:  Lab Results  Component Value Date/Time   HMDIABFOOTEX done 08/29/2013 12:00 AM     Lab Results  Component Value Date   CHOL 133 01/04/2020   HDL 44 01/04/2020   LDLCALC 72 01/04/2020   TRIG 85 01/04/2020   CHOLHDL 3.0 01/04/2020    Hepatic Function Latest Ref Rng & Units 07/17/2020 12/22/2018 05/24/2018  Total Protein 6.0 - 8.3 g/dL 7.8 7.5 7.5  Albumin 3.5 - 5.2 g/dL 4.4 4.3 4.2  AST 0 - 37 U/L '18 15 15  ' ALT 0 - 53 U/L '19 16 16  ' Alk Phosphatase 39 - 117 U/L 71 65 62  Total Bilirubin 0.2 - 1.2 mg/dL 1.1 1.0 0.7  Bilirubin, Direct 0.0 - 0.3 mg/dL 0.2 - -    Lab Results  Component Value Date/Time   TSH 2.32 07/17/2020 02:23 PM   TSH 4.28 01/04/2020 09:33 AM    CBC Latest Ref Rng & Units 07/17/2020 01/12/2020 01/04/2020  WBC 4.0 - 10.5 K/uL 6.6 6.7 5.1  Hemoglobin 13.0 - 17.0 g/dL 13.2 11.9(L) 11.6(L)  Hematocrit 39.0 - 52.0 % 39.6 34.9(L) 34.8(L)  Platelets 150.0 - 400.0 K/uL 209.0 202 184    No results found for: VD25OH  Clinical ASCVD: No  The ASCVD Risk score Mikey Bussing DC Jr., et al., 2013) failed to calculate for the following reasons:   The 2013 ASCVD risk score is only valid for ages 89 to 2    Depression screen PHQ 2/9 08/13/2020 07/17/2020 02/10/2019  Decreased Interest 0 0 0  Down, Depressed, Hopeless 0 0 0  PHQ - 2 Score 0 0 0    GAD 7 : Generalized Anxiety Score 05/25/2018 11/24/2016  Nervous, Anxious, on Edge 2 0  Control/stop worrying 0 0  Worry too much - different things 1 0  Trouble relaxing 1 0  Restless 1 0  Easily annoyed or irritable 0 0  Afraid - awful might happen 0 0  Total GAD 7 Score 5 0   CHA2DS2-VASc Score = 4  The patient's score is based upon: CHF History: No HTN History: Yes Diabetes History: Yes Stroke History:  No Vascular Disease History: No Age Score: 2 Gender Score: 0     Social History   Tobacco Use  Smoking Status Never Smoker  Smokeless Tobacco Never Used   BP Readings from Last 3 Encounters:  07/17/20 (!) 156/88  03/27/20 (!) 144/82  03/19/20 (!) 144/66   Pulse Readings from Last 3 Encounters:  07/17/20 87  03/27/20 63  03/19/20 76   Wt Readings from Last 3 Encounters:  07/17/20 166 lb (75.3 kg)  03/27/20 168 lb (76.2 kg)  03/19/20 165 lb 6 oz (75 kg)    Assessment/Interventions: Review of patient past medical history, allergies, medications, health status, including review of consultants reports, laboratory and other test data, was performed as part of comprehensive evaluation and provision of chronic care management services.   SDOH:  (Social Determinants of Health) assessments and interventions performed: Yes   CCM Care Plan  Allergies  Allergen Reactions  . Lisinopril Cough    Medications Reviewed Today    Reviewed by Charlton Haws, [facility] (Pharmacist) on 09/24/20 at [place] List Status: <None>  Medication Order Taking? Sig Documenting Provider Last Dose Status Informant  ALPRAZolam (XANAX) 0.5 MG tablet [id number] Yes TAKE 1 TABLET BY MOUTH DAILY AS NEEDED FOR ANXIETY [name], MD Taking Active   apixaban (ELIQUIS) 2.5 MG TABS tablet [id number] Yes TAKE 1 TABLET (2.5 MG TOTAL) BY MOUTH 2 (TWO) TIMES DAILY. [name], MD Taking Active   atorvastatin (LIPITOR) 20 MG tablet [id number] Yes TAKE 1 TABLET BY MOUTH DAILY. [name], MD Taking Active   blood glucose meter kit and supplies KIT [id number] Yes Use to test blood sugar once daily. DX: E11.9 [name], MD Taking Active   Blood Glucose Monitoring Suppl ([address] [place]) DEVI [id number] Yes  [provider] Taking Active   Cholecalciferol (VITAMIN D) 1000 UNITS capsule [id number] Yes Take 1,000 Units by mouth daily. [provider] Taking Active            Med Note  Thurmond Butts, Johny Drilling   Fri Jan 16, 2017 10:06 AM)    Continuous Blood Gluc Receiver (FREESTYLE LIBRE 2 READER) [place] [id number] Yes USE AS DIRECTED [name], MD Taking Active   Continuous Blood Gluc Sensor (FREESTYLE LIBRE 2 SENSOR) [place] [id number] Yes USE AS DIRECTED [name], MD Taking Active   Glucagon 1 MG/0.2ML Darden Palmer [id number] Yes INJECT 1 PEN INTO THE SKIN DAILY AS NEEDED FOR LOW BLOOD SUGAR [name], MD Taking Active   glucose blood (FREESTYLE LITE) test strip [id number] Yes USE TO CHECK BLOOD  SUGAR ONCE DAILY [name], MD Taking Active   Insulin Glargine-yfgn (SEMGLEE, YFGN,) 100 UNIT/ML SOLN [id number] Yes Inject 10 Units into the skin at bedtime. PEN [provider] Taking Active   Insulin Pen Needle 31G X 6 MM MISC [id number] Yes USE TO INJECT INSULIN 5 TIMES DAILY [name], MD Taking Active   L-Methylfolate-Algae-B12-B6 3-90.314-2-35 MG CAPS [id number] Yes TAKE 1 CAPSULE BY MOUTH 2 (TWO) TIMES DAILY. [name], MD Taking Active            Med Note Malena Catholic Aug 15, 2020  2:28 PM) Not covered by insurance ~75 out of pocket  Lancets MISC [id number] Yes Use to test blood sugar once daily. DX E11.09 [name], MD Taking Active   metoprolol succinate (TOPROL-XL) 25 MG 24 hr tablet [id number] Yes TAKE 1 TABLET (25 MG TOTAL) BY MOUTH DAILY. Belva Crome, MD Taking Active   Multiple Vitamins-Minerals (CENTRUM SILVER PO) [id number] Yes Take by mouth. [provider] Taking Active   pantoprazole (PROTONIX) 40 MG tablet [id number] Yes TAKE 1 TABLET BY MOUTH ONCE DAILY. [name], MD Taking Active   tamsulosin [facility]) 0.4 MG CAPS capsule [id number] Yes TAKE 1 CAPSULE BY MOUTH DAILY. [name], MD Taking Active         Discontinued 07/17/20 1749 (Discontinued by provider)           Patient Active Problem List   Diagnosis Date Noted  . Encounter for general adult medical examination with abnormal findings  07/18/2020  . Atrial fibrillation by electrocardiogram ([place]) 01/12/2020  . Murmur, cardiac 01/04/2020  . Bradycardia 01/04/2020  . Internal hemorrhoid 12/08/2019  . Tinea cruris 12/05/2019  . Seborrheic dermatitis of scalp 05/05/2019  . Diabetic polyneuropathy associated with type 2 diabetes mellitus ([place]) 03/29/2019  . Seasonal allergic rhinitis due to pollen 05/24/2018  . Sciatica 10/21/2017  . Chronic renal disease, stage 3, moderately decreased glomerular filtration rate (GFR) between 30-59 mL/min/1.73 square meter (HCC) 07/02/2017  . Routine general medical examination at a health care facility 12/26/2013  . DDD (degenerative disc disease), cervical 12/26/2013  . Hypertension   . Hyperlipidemia with target LDL less than 100   . Type II diabetes mellitus with manifestations ([place])   . GERD (gastroesophageal reflux disease)   . DJD (degenerative joint disease)   . Anxiety     Immunization History  Administered Date(s) Administered  . Fluad Quad(high Dose 65+) 02/24/2019  . Influenza Split 03/05/2011, 03/23/2012  . Influenza Whole 03/23/2002, 05/16/2009  . Influenza, High Dose Seasonal PF 03/23/2017, 03/27/2018  . Influenza-Unspecified 03/23/2014, 03/23/2016, 03/23/2020  . PFIZER(Purple Top)SARS-COV-2 Vaccination 06/15/2019, 07/06/2019  . Pneumococcal Conjugate-13 12/26/2013  . Pneumococcal Polysaccharide-23 03/18/2006, 05/24/2018  . Tdap 04/08/2012  . Zoster 08/08/2004    Conditions to be addressed/monitored:  Hypertension, Hyperlipidemia, Diabetes, Atrial Fibrillation and Chronic Kidney Disease  Care Plan : CCM Pharmacy Care Plan  Updates made by Charlton Haws, RPH since 09/24/2020 12:00 AM    Problem: Hypertension, Hyperlipidemia, Diabetes, Atrial Fibrillation, Chronic Kidney Disease   Priority: High    Long-Range Goal: Disease management   Start Date: 08/15/2020  Expected End Date: 02/12/2021  This Visit's Progress: Not on track  Recent Progress: On track   Priority: High  Note:   Current Barriers:  . Unable to independently monitor therapeutic efficacy . Unable to achieve control of diabetes  . Suboptimal therapeutic regimen for diabetes  Pharmacist Clinical Goal(s):  Marland Kitchen Over  the next 30 days, patient will achieve adherence to monitoring guidelines and medication adherence to achieve therapeutic efficacy . achieve control of diabetes as evidenced by improvement in blood sugar readings . adhere to plan to optimize therapeutic regimen for diabetes as evidenced by report of adherence to recommended medication management changes through collaboration with PharmD and provider.   Interventions: . 1:1 collaboration with [name], MD regarding development and update of comprehensive plan of care as evidenced by provider attestation and co-signature . Inter-disciplinary care team collaboration (see longitudinal plan of care) . Comprehensive medication review performed; medication list updated in electronic medical record  Hypertension (BP goal <140/90) -Not ideally controlled - pt is not checking BP at home and it has been above goal in office recently -Current treatment: . Metoprolol succinate 25 mg daily -Medications previously tried: chlorthalidone, lisinopril, telmisartan -Current home readings: not checking -Current exercise habits: walks hospital hallways at work -Denies hypotensive/hypertensive symptoms -Educated on BP goals and benefits of medications for prevention of heart attack, stroke and kidney damage; Daily salt intake goal < 2300 mg; Exercise goal of 150 minutes per week; Importance of home blood pressure monitoring; -Counseled to monitor BP at home daily, document, and provide log at future appointments -Recommended to continue current medication  Hyperlipidemia: (LDL goal < 100) -Controlled -Current treatment: . Atorvastatin 20 mg daily -Educated on Cholesterol goals;  Benefits of statin for ASCVD risk  reduction; -Recommended to continue current medication  Diabetes (A1c goal <7%) -Uncontrolled -pt was taking Basaglar 10 units for 10 days, when he ran out he did not refill [name] as instructed last visit. He was told at the pharmacy that his insurance will cover [facility] for $0 copay and he requests this be prescribed. He did pick up [employer] and his family member who is a Marine scientist showed him how to use it. He reports BG 142 today and has been up and down since he hasn't been taking insulin. -Current medications: . Basaglar 10 units daily - no longer taking (supply ran out) . Humalog 5 units w/ meals - on hold  . Glucagon (Gvoke Hypopen) PRN . Freestyle Lite test strips -Medications previously tried: Synjardy, metformin  -Current home glucose readings . fasting glucose: 142 today -Denies hypoglycemic/hyperglycemic symptoms -Educated onA1c and blood sugar goals; Proper insulin injection technique; Benefits of routine self-monitoring of blood sugar;  -Start Semglee 10 units daily at bedtime. Continue to hold Humalog.  Atrial Fibrillation (Goal: prevent stroke and major bleeding) -Controlled -CHADSVASC: 4 -Current treatment: . Rate control: Metoprolol succinate 25 mg daily . Anticoagulation: Eliquis 2.5 mg BID -Counseled on increased risk of stroke due to Afib and benefits of anticoagulation for stroke prevention; importance of adherence to anticoagulant exactly as prescribed; avoidance of NSAIDs due to increased bleeding risk with anticoagulants; seeking medical attention after a head injury or if there is blood in the urine/stool; -Recommended to continue current medication  Patient Goals/Self-Care Activities . Over the next 30 days, patient will:  - take medications as prescribed focus on medication adherence by routine check glucose 3 times daily, document, and provide at future appointments check blood pressure daily, document, and provide at future appointments  Follow  Up Plan: Telephone follow up appointment with care management team member scheduled for: 1 month       Medication Assistance: None required.  Patient affirms current coverage meets needs.  Patient's preferred pharmacy is:  [facility] [address] [place] [address] Phone: [phone] Fax: [phone]  Uses pill box? No - prefers bottles Pt endorses 100% compliance  We discussed: Current pharmacy is preferred with insurance plan and patient is satisfied with pharmacy services Patient decided to: Continue current medication management strategy  Care Plan and Follow Up Patient Decision:  Patient agrees to Care Plan and Follow-up.  Plan: Telephone follow up appointment with care management team member scheduled for:  1 month  Charlene Brooke, PharmD, [facility] Clinical Pharmacist [facility] [phone]

## 2020-09-24 NOTE — Patient Instructions (Addendum)
Visit Information  Phone number for Pharmacist: [phone]  Goals Addressed            This Visit's Progress   . Manage My Medicine       Timeframe:  Long-Range Goal Priority:  High Start Date:      08/15/20                       Expected End Date:  11/12/20                     Follow Up Date 10/20/20   - call for medicine refill 2 or 3 days before it runs out - call if I am sick and can't take my medicine - keep a list of all the medicines I take; vitamins and herbals too  -Use Semglee 10 units each morning. DO NOT take Humalog with meals for the time being -Pick up [name] from the pharmacy and contact [name] to set up an appointment to learn how to use it   Why is this important?   . These steps will help you keep on track with your medicines.   Notes:       Patient Care Plan: Diabetes Type 2 (Adult)    Problem Identified: Glycemic Management (Diabetes, Type 2)   Priority: High    Long-Range Goal: Glycemic Management Optimized   Start Date: 08/13/2020  Expected End Date: 11/11/2020  Recent Progress: On track  Priority: High  Note:   Objective:  Lab Results  Component Value Date   HGBA1C 8.4 (H) 07/17/2020 .   Lab Results  Component Value Date   CREATININE 1.74 (H) 07/17/2020   CREATININE 1.56 (H) 01/04/2020   CREATININE 1.51 (H) 09/22/2019 .   Marland Kitchen No results found for: EGFR Current Barriers:  Marland Kitchen Knowledge Deficits related to basic Diabetes pathophysiology and self care/management: lacks understanding of significance of monitoring and reaching A1-C target; needs ongoing reeducation/ reinforcement of carb modified diet . Knowledge Deficits related to medications used for management of diabetes: lacks understanding of difference between long and short acting insulins . Knowledge Deficits related to self administration of injectable diabetes medications- recently discovered he was not using long acting insulin correctly and needs reinforcement and follow up  with pharmacist; patient had not been removing cap of long-acting insulin and was not receiving prescribed dose, he has since been unable to re-fill this medication through his outpatient pharmacy Case Manager Clinical Goal(s):  Over the next 90 days, patient will demonstrate improved adherence to prescribed treatment plan for diabetes self care/management as evidenced by:  . daily monitoring and recording of CBG 3-4 times per day  . adherence to ADA/ carb modified diet  . adherence to prescribed medication regimen contacting provider for new or worsened symptoms or questions Interventions:  . Collaboration with [name], MD regarding development and update of comprehensive plan of care as evidenced by provider attestation and co-signature . Inter-disciplinary care team collaboration (see longitudinal plan of care); direct collaboration with Charlene Brooke, Pharm D regarding medication management concerns . Reviewed medications with patient post- recent CCM Pharmacist visit and updated EHR accordingly; discussed importance of medication adherence, confirmed patient has contact information for CCM Pharmacist and encouraged him to maintain contact with CCM Pharmacist for any medication questions/ issues . Discussed plans with patient for ongoing care management follow up and confirmed patient has direct contact information for care management team . Confirmed patient has been monitoring/ recording  blood sugars 3-4 times per day using newly acquired FSL/ CGM: reviewed with patient recent blood sugars at home today; he reports fasting ranges consistently between 116-170 and post- prandial values consistently between 180-200: confirmed today no recent signs/ symptoms/ values < 100 Patient Goals/Self-Care Activities UNABLE to independently verbalize A1-C goal and needs reinforcement around use of and purpose of medications for DM . Self administers medications as prescribed . Self administers  insulin as prescribed . Continue checking blood sugars at home 3-4 times per day; write down all blood sugars and when you are taking your blood sugars: please write down what the "fasting (before eating/ morning)" blood sugars are and what the "2 hour after-eating" blood sugars are: these values help your doctor dose your insulin to make sure it is not too much or too little . check blood sugar if I feel it is too high or too low . take the blood sugar log to all doctor visits--- call your care providers if you have questions or concerns . If you have questions about what medications you should be taking, be  let Pharmacist Charlene Brooke at Dr. Ronnald Ramp office  Follow Up Plan:  . Telephone follow up appointment with care management team member scheduled for: Wednesday September 19, 2020 at 11:30 am . The patient has been provided with contact information for the care management team and has been advised to call with any health related questions or concerns.     Patient Care Plan: Hypertension (Adult)    Problem Identified: Hypertension (Hypertension)   Priority: Medium    Long-Range Goal: Hypertension Monitored   Start Date: 08/13/2020  Expected End Date: 11/11/2020  This Visit's Progress: On track  Recent Progress: On track  Priority: Medium  Note:   Objective:  . Last practice recorded BP readings:  BP Readings from Last 3 Encounters:  07/17/20 (!) 156/88  03/27/20 (!) 144/82  03/19/20 (!) 144/66   . Most recent eGFR/CrCl: No results found for: EGFR  No components found for: CRCL Current Barriers:  Marland Kitchen Knowledge Deficits related to basic understanding of hypertension pathophysiology and self care management; patient adhering to medication and prescribed diet for HTN; could benefit from ongoing reinforcement of strategies for blood pressure management . Unable to independently verbalize blood pressures at home and blood pressure target ranges Case Manager Clinical Goal(s):  Marland Kitchen Over the  next 90 days, patient will demonstrate improved adherence to prescribed treatment plan for hypertension as evidenced by taking all medications as prescribed, monitoring and recording blood pressure 2- 3 times per week, adhering to low sodium/DASH diet Interventions:  . Collaboration with [name], MD regarding development and update of comprehensive plan of care as evidenced by provider attestation and co-signature . Inter-disciplinary care team collaboration (see longitudinal plan of care) . Reinforced previously provided education to patient re: DASH diet, complications of uncontrolled blood pressure; how diabetes affects blood pressure: patient requires ongoing reinforcement . Discussed plans with patient for ongoing care management follow up and provided patient with direct contact information for care management team . Advised patient, providing education and rationale, to monitor blood pressure 2-3 times per week and record, calling care providers for questions or concerns: attempted to review recently recorded blood pressures with patient, however, he declines same; states he does not have his BP log with him- he verbally agrees to have at time of next scheduled call; positive reinforcement provided to patient for initiating regular blood pressure monitoring at home . Confirmed no recent  changes to BP medications, patient continues taking as prescribed . Discussed Advanced Directives with patient and provided written educational material for his review Patient Goals/Self-Care Activities . Self administers medications as prescribed . Attends all scheduled provider appointments . Calls provider office for new concerns, questions, or BP outside discussed parameters . Follows a low sodium diet/DASH diet . Continue checking blood pressure 2- 3 times per week . Write blood pressure results in a log or diary: we will review these next time we talk  . Continue eating a low-salt, heart healthy  diet- this helps manage your diabetes and your blood pressure Follow Up Plan:  . Telephone follow up appointment with care management team member scheduled for: September 19, 2020 at 11:30 am . The patient has been provided with contact information for the care management team and has been advised to call with any health related questions or concerns.     Patient Care Plan: CCM Pharmacy Care Plan    Problem Identified: Hypertension, Hyperlipidemia, Diabetes, Atrial Fibrillation, Chronic Kidney Disease   Priority: High    Long-Range Goal: Disease management   Start Date: 08/15/2020  Expected End Date: 02/12/2021  This Visit's Progress: Not on track  Recent Progress: On track  Priority: High  Note:   Current Barriers:  . Unable to independently monitor therapeutic efficacy . Unable to achieve control of diabetes  . Suboptimal therapeutic regimen for diabetes  Pharmacist Clinical Goal(s):  Marland Kitchen Over the next 30 days, patient will achieve adherence to monitoring guidelines and medication adherence to achieve therapeutic efficacy . achieve control of diabetes as evidenced by improvement in blood sugar readings . adhere to plan to optimize therapeutic regimen for diabetes as evidenced by report of adherence to recommended medication management changes through collaboration with PharmD and provider.   Interventions: . 1:1 collaboration with [name], MD regarding development and update of comprehensive plan of care as evidenced by provider attestation and co-signature . Inter-disciplinary care team collaboration (see longitudinal plan of care) . Comprehensive medication review performed; medication list updated in electronic medical record  Hypertension (BP goal <140/90) -Not ideally controlled - pt is not checking BP at home and it has been above goal in office recently -Current treatment: . Metoprolol succinate 25 mg daily -Medications previously tried: chlorthalidone, lisinopril,  telmisartan -Current home readings: not checking -Current exercise habits: walks hospital hallways at work -Denies hypotensive/hypertensive symptoms -Educated on BP goals and benefits of medications for prevention of heart attack, stroke and kidney damage; Daily salt intake goal < 2300 mg; Exercise goal of 150 minutes per week; Importance of home blood pressure monitoring; -Counseled to monitor BP at home daily, document, and provide log at future appointments -Recommended to continue current medication  Hyperlipidemia: (LDL goal < 100) -Controlled -Current treatment: . Atorvastatin 20 mg daily -Educated on Cholesterol goals;  Benefits of statin for ASCVD risk reduction; -Recommended to continue current medication  Diabetes (A1c goal <7%) -Uncontrolled -pt was taking Basaglar 10 units for 10 days, when he ran out he did not refill [name] as instructed last visit. He was told at the pharmacy that his insurance will cover [facility] for $0 copay and he requests this be prescribed. He did pick up [employer] and his family member who is a Marine scientist showed him how to use it. He reports BG 142 today and has been up and down since he hasn't been taking insulin. -Current medications: . Basaglar 10 units daily - no longer taking (supply ran out) .  Humalog 5 units w/ meals - on hold  . Glucagon (Gvoke Hypopen) PRN . Freestyle Lite test strips -Medications previously tried: Synjardy, metformin  -Current home glucose readings . fasting glucose: 142 today -Denies hypoglycemic/hyperglycemic symptoms -Educated onA1c and blood sugar goals; Proper insulin injection technique; Benefits of routine self-monitoring of blood sugar;  -Start Semglee 10 units daily at bedtime. Continue to hold Humalog.  Atrial Fibrillation (Goal: prevent stroke and major bleeding) -Controlled -CHADSVASC: 4 -Current treatment: . Rate control: Metoprolol succinate 25 mg daily . Anticoagulation: Eliquis 2.5 mg  BID -Counseled on increased risk of stroke due to Afib and benefits of anticoagulation for stroke prevention; importance of adherence to anticoagulant exactly as prescribed; avoidance of NSAIDs due to increased bleeding risk with anticoagulants; seeking medical attention after a head injury or if there is blood in the urine/stool; -Recommended to continue current medication  Patient Goals/Self-Care Activities . Over the next 30 days, patient will:  - take medications as prescribed focus on medication adherence by routine check glucose 3 times daily, document, and provide at future appointments check blood pressure daily, document, and provide at future appointments  Follow Up Plan: Telephone follow up appointment with care management team member scheduled for: 1 month     Patient verbalizes understanding of instructions provided today and agrees to view in [place].  Telephone follow up appointment with pharmacy team member scheduled for: 1 month  Charlene Brooke, PharmD, [place], CPP Clinical Pharmacist [facility] at [facility] [phone]

## 2020-09-25 ENCOUNTER — Other Ambulatory Visit (HOSPITAL_COMMUNITY): Payer: Self-pay

## 2020-09-26 ENCOUNTER — Other Ambulatory Visit (HOSPITAL_COMMUNITY): Payer: Self-pay

## 2020-09-26 NOTE — Chronic Care Management (AMB) (Signed)
  Care Management   Note  09/26/2020 Name: Dale Anderson MRN: [id number] DOB: 03/31/1930  [name] is a 87 y.o. year old male who is a primary care patient of [name], MD and is actively engaged with the care management team. I reached out to [name] by phone today to assist with re-scheduling a follow up visit with the RN Case Manager  Follow up plan: Second unsuccessful telephone outreach attempt made.The care management team will reach out to the patient again over the next 7 days.  If patient returns call to provider office, please advise to call [place] Lysle Morales at [facility]

## 2020-09-27 ENCOUNTER — Other Ambulatory Visit (HOSPITAL_COMMUNITY): Payer: Self-pay

## 2020-10-01 ENCOUNTER — Telehealth: Payer: Self-pay | Admitting: Pharmacist

## 2020-10-01 NOTE — Progress Notes (Signed)
    Chronic Care Management Pharmacy Assistant   Name: Dale Anderson  MRN: [id number] DOB: Apr 09, 1930  A call was made this morning to [name] to ask if he had picked up Semglee insulin from the pharmacy, per [name] the clinical pharmacist. Spoke with patient wife this morning and she said the the patient had picked it up and has been using now for about 3 days. Then spoke with patient and asked how blood sugar was doing. Patient states that his blood sugar ranges between 150-170, but when he eats it may go as high as 279 but after a few hours it will go back down around 150 to 160. I told the patient that I will pass along this information to [place].  [place] Pharmacist Assistant [phone]  Time spent:19 mins

## 2020-10-01 NOTE — Addendum Note (Signed)
Addended by: Charlton Haws on: 10/01/2020 04:32 PM   Modules accepted: Orders

## 2020-10-02 NOTE — Chronic Care Management (AMB) (Signed)
  Care Management   Note  10/02/2020 Name: Dale Anderson MRN: [id number] DOB: 1930/04/14  Dale Anderson is a 87 y.o. year old male who is a primary care patient of [name], MD and is actively engaged with the care management team. I reached out to [name] by phone today to assist with re-scheduling a follow up visit with the RN Case Manager  Follow up plan: 3 unsuccessful telephone outreach attempts made. A HIPAA compliant phone message was left for the patient providing contact information and requesting a return call.  Unable to make contact on outreach attempts x 3. PCP [name], MD notified via routed documentation in medical record.  We have been unable to make contact with the patient for follow up. The care management team is available to follow up with the patient after provider conversation with the patient regarding recommendation for care management engagement and subsequent re-referral to the care management team.  If patient returns call to provider office, please advise to call [place] Lysle Morales at [facility]

## 2020-10-05 ENCOUNTER — Other Ambulatory Visit (HOSPITAL_COMMUNITY): Payer: Self-pay

## 2020-10-05 MED FILL — Continuous Glucose System Sensor: 14 days supply | Qty: 1 | Fill #0 | Status: CN

## 2020-10-08 ENCOUNTER — Other Ambulatory Visit (HOSPITAL_COMMUNITY): Payer: Self-pay

## 2020-10-08 MED FILL — L-Methylfolate-Algae-Vit B12-B6 Cap 3-90.314-2-35 MG: ORAL | 30 days supply | Qty: 60 | Fill #0 | Status: AC

## 2020-10-08 MED FILL — Pantoprazole Sodium EC Tab 40 MG (Base Equiv): ORAL | 90 days supply | Qty: 90 | Fill #0 | Status: AC

## 2020-10-10 ENCOUNTER — Ambulatory Visit: Payer: 59 | Admitting: Pharmacist

## 2020-10-10 ENCOUNTER — Other Ambulatory Visit: Payer: Self-pay

## 2020-10-10 DIAGNOSIS — E118 Type 2 diabetes mellitus with unspecified complications: Secondary | ICD-10-CM

## 2020-10-10 DIAGNOSIS — N1831 Chronic kidney disease, stage 3a: Secondary | ICD-10-CM

## 2020-10-10 DIAGNOSIS — I4891 Unspecified atrial fibrillation: Secondary | ICD-10-CM

## 2020-10-10 DIAGNOSIS — E785 Hyperlipidemia, unspecified: Secondary | ICD-10-CM | POA: Diagnosis not present

## 2020-10-10 DIAGNOSIS — I10 Essential (primary) hypertension: Secondary | ICD-10-CM

## 2020-10-10 DIAGNOSIS — N183 Chronic kidney disease, stage 3 unspecified: Secondary | ICD-10-CM

## 2020-10-10 NOTE — Patient Instructions (Signed)
Increase Semglee to 15 units daily Continue to monitor BG continuously using Freestyle Libre 2. Change sensor every 2 weeks as demonstrated today.  Charlene Brooke, PharmD, [place], CPP Clinical Pharmacist [facility] at [facility] [phone]

## 2020-10-10 NOTE — Progress Notes (Signed)
Patient presented for help with [facility] 2 application. He had broken his sensor trying to put it on and got a new one from [place], but wanted instruction on how to apply it.  I demonstrated proper application technique, and patient was able to apply the sensor himself today. He voiced confidence in his ability to apply a new sensor himself in 2 weeks. He also successfully paired the new sensor with his reader today.  Diabetes Assessment/Plan -pt is currently taking Semglee (glargine) 10 units daily. He reports fasting BG has been 160-170. He reports sometimes BG goes into 200s after eating. -Advised to increase insulin to 15 units daily and continue to monitor. May consider adding GLP-1 agonist to target post-prandial BG once fasting is more controlled.  Pt voiced understanding of above and denies further questions or concerns.  Follow up call in 2 weeks to check BGs  Charlene Brooke, PharmD, [place], CPP Clinical Pharmacist [facility] at [facility] [phone]

## 2020-10-17 ENCOUNTER — Ambulatory Visit (INDEPENDENT_AMBULATORY_CARE_PROVIDER_SITE_OTHER): Payer: 59 | Admitting: Internal Medicine

## 2020-10-17 ENCOUNTER — Other Ambulatory Visit: Payer: Self-pay

## 2020-10-17 ENCOUNTER — Encounter: Payer: Self-pay | Admitting: Internal Medicine

## 2020-10-17 VITALS — BP 148/72 | HR 56 | Temp 98.0°F | Ht 68.25 in | Wt 167.6 lb

## 2020-10-17 DIAGNOSIS — E118 Type 2 diabetes mellitus with unspecified complications: Secondary | ICD-10-CM | POA: Diagnosis not present

## 2020-10-17 DIAGNOSIS — E1142 Type 2 diabetes mellitus with diabetic polyneuropathy: Secondary | ICD-10-CM

## 2020-10-17 DIAGNOSIS — N183 Chronic kidney disease, stage 3 unspecified: Secondary | ICD-10-CM | POA: Diagnosis not present

## 2020-10-17 DIAGNOSIS — I10 Essential (primary) hypertension: Secondary | ICD-10-CM

## 2020-10-17 LAB — URINALYSIS, ROUTINE W REFLEX MICROSCOPIC
Bilirubin Urine: NEGATIVE
Hgb urine dipstick: NEGATIVE
Ketones, ur: NEGATIVE
Leukocytes,Ua: NEGATIVE
Nitrite: NEGATIVE
RBC / HPF: NONE SEEN (ref 0–?)
Specific Gravity, Urine: 1.01 (ref 1.000–1.030)
Total Protein, Urine: NEGATIVE
Urine Glucose: NEGATIVE
Urobilinogen, UA: 0.2 (ref 0.0–1.0)
pH: 6 (ref 5.0–8.0)

## 2020-10-17 LAB — CBC WITH DIFFERENTIAL/PLATELET
Basophils Absolute: 0 10*3/uL (ref 0.0–0.1)
Basophils Relative: 0.3 % (ref 0.0–3.0)
Eosinophils Absolute: 0.2 10*3/uL (ref 0.0–0.7)
Eosinophils Relative: 2.6 % (ref 0.0–5.0)
HCT: 35.4 % — ABNORMAL LOW (ref 39.0–52.0)
Hemoglobin: 12 g/dL — ABNORMAL LOW (ref 13.0–17.0)
Lymphocytes Relative: 23.9 % (ref 12.0–46.0)
Lymphs Abs: 1.4 10*3/uL (ref 0.7–4.0)
MCHC: 33.9 g/dL (ref 30.0–36.0)
MCV: 91.8 fl (ref 78.0–100.0)
Monocytes Absolute: 0.6 10*3/uL (ref 0.1–1.0)
Monocytes Relative: 9.4 % (ref 3.0–12.0)
Neutro Abs: 3.7 10*3/uL (ref 1.4–7.7)
Neutrophils Relative %: 63.8 % (ref 43.0–77.0)
Platelets: 170 10*3/uL (ref 150.0–400.0)
RBC: 3.86 Mil/uL — ABNORMAL LOW (ref 4.22–5.81)
RDW: 14 % (ref 11.5–15.5)
WBC: 5.9 10*3/uL (ref 4.0–10.5)

## 2020-10-17 LAB — HEMOGLOBIN A1C: Hgb A1c MFr Bld: 7.9 % — ABNORMAL HIGH (ref 4.6–6.5)

## 2020-10-17 LAB — BASIC METABOLIC PANEL
BUN: 32 mg/dL — ABNORMAL HIGH (ref 6–23)
CO2: 26 mEq/L (ref 19–32)
Calcium: 9.1 mg/dL (ref 8.4–10.5)
Chloride: 105 mEq/L (ref 96–112)
Creatinine, Ser: 1.58 mg/dL — ABNORMAL HIGH (ref 0.40–1.50)
GFR: 38.83 mL/min — ABNORMAL LOW (ref >60.00)
Glucose, Bld: 88 mg/dL (ref 70–99)
Potassium: 4.5 mEq/L (ref 3.5–5.1)
Sodium: 137 mEq/L (ref 135–145)

## 2020-10-17 LAB — MICROALBUMIN / CREATININE URINE RATIO
Creatinine,U: 57.2 mg/dL
Microalb Creat Ratio: 17.5 mg/g (ref 0.0–30.0)
Microalb, Ur: 10 mg/dL — ABNORMAL HIGH (ref 0.0–1.9)

## 2020-10-17 NOTE — Progress Notes (Signed)
Subjective:  Patient ID: Dale Anderson, male    DOB: March 17, 1930  Age: 87 y.o. MRN: [id number]  CC: Diabetes  This visit occurred during the SARS-CoV-2 public health emergency.  Safety protocols were in place, including screening questions prior to the visit, additional usage of staff PPE, and extensive cleaning of exam room while observing appropriate contact time as indicated for disinfecting solutions.    HPI Dale Anderson presents for f/up - He feels well and thinks his blood sugar is well controlled.  Outpatient Medications Prior to Visit  Medication Sig Dispense Refill  . ALPRAZolam (XANAX) 0.5 MG tablet TAKE 1 TABLET BY MOUTH DAILY AS NEEDED FOR ANXIETY 30 tablet 3  . apixaban (ELIQUIS) 2.5 MG TABS tablet TAKE 1 TABLET (2.5 MG TOTAL) BY MOUTH 2 (TWO) TIMES DAILY. 180 tablet 1  . atorvastatin (LIPITOR) 20 MG tablet TAKE 1 TABLET BY MOUTH DAILY. 90 tablet 1  . blood glucose meter kit and supplies KIT Use to test blood sugar once daily. DX: E11.9 1 each 0  . Blood Glucose Monitoring Suppl (FREESTYLE LITE) DEVI   0  . Cholecalciferol (VITAMIN D) 1000 UNITS capsule Take 1,000 Units by mouth daily.    . Continuous Blood Gluc Receiver (FREESTYLE LIBRE 2 READER) DEVI USE AS DIRECTED 2 each 5  . Continuous Blood Gluc Sensor (FREESTYLE LIBRE 2 SENSOR) MISC USE AS DIRECTED 2 each 5  . Glucagon 1 MG/0.2ML SOAJ INJECT 1 PEN INTO THE SKIN DAILY AS NEEDED FOR LOW BLOOD SUGAR .8 mL 5  . glucose blood (FREESTYLE LITE) test strip USE TO CHECK BLOOD SUGAR ONCE DAILY 100 strip 1  . Insulin Glargine-yfgn (SEMGLEE, YFGN,) 100 UNIT/ML SOPN Inject 10 Units into the skin daily. (Patient taking differently: Inject 15 Units into the skin daily.) 9 mL 3  . Insulin Pen Needle 31G X 6 MM MISC USE TO INJECT INSULIN 5 TIMES DAILY 100 each 5  . L-Methylfolate-Algae-B12-B6 3-90.314-2-35 MG CAPS TAKE 1 CAPSULE BY MOUTH 2 (TWO) TIMES DAILY. 60 capsule 5  . Lancets MISC Use to test blood sugar once daily. DX  E11.09 100 each 3  . metoprolol succinate (TOPROL-XL) 25 MG 24 hr tablet TAKE 1 TABLET (25 MG TOTAL) BY MOUTH DAILY. 90 tablet 3  . Multiple Vitamins-Minerals (CENTRUM SILVER PO) Take by mouth.    . pantoprazole (PROTONIX) 40 MG tablet TAKE 1 TABLET BY MOUTH ONCE DAILY. 90 tablet 1  . tamsulosin (FLOMAX) 0.4 MG CAPS capsule TAKE 1 CAPSULE BY MOUTH DAILY. 90 capsule 1   No facility-administered medications prior to visit.    ROS Review of Systems  Constitutional: Negative.  Negative for diaphoresis and fatigue.  HENT: Negative.   Eyes: Negative.   Respiratory: Negative for cough, chest tightness, shortness of breath and wheezing.   Cardiovascular: Negative for chest pain, palpitations and leg swelling.  Gastrointestinal: Negative for abdominal pain, diarrhea, nausea and vomiting.  Endocrine: Negative for polydipsia, polyphagia and polyuria.  Genitourinary: Negative.  Negative for difficulty urinating.  Musculoskeletal: Negative for myalgias.  Skin: Negative.  Negative for color change.  Neurological: Negative.  Negative for dizziness, weakness and light-headedness.  Hematological: Negative for adenopathy. Does not bruise/bleed easily.  Psychiatric/Behavioral: Negative.     Objective:  BP (!) 148/72 (BP Location: Right Arm, Patient Position: Sitting, Cuff Size: Large)   Pulse (!) 56   Temp 98 F (36.7 C) (Oral)   Ht 5' 8.25" (1.734 m)   Wt 167 lb 9.6 oz (76 kg)  SpO2 92%   BMI 25.30 kg/m   BP Readings from Last 3 Encounters:  10/17/20 (!) 148/72  07/17/20 (!) 156/88  03/27/20 (!) 144/82    Wt Readings from Last 3 Encounters:  10/17/20 167 lb 9.6 oz (76 kg)  07/17/20 166 lb (75.3 kg)  03/27/20 168 lb (76.2 kg)    Physical Exam Vitals reviewed.  HENT:     Nose: Nose normal.     Mouth/Throat:     Mouth: Mucous membranes are moist.  Eyes:     General: No scleral icterus.    Conjunctiva/sclera: Conjunctivae normal.  Cardiovascular:     Rate and Rhythm: Normal rate  and regular rhythm.     Heart sounds: No murmur heard.   Pulmonary:     Effort: Pulmonary effort is normal.     Breath sounds: No stridor. No wheezing, rhonchi or rales.  Abdominal:     General: Abdomen is flat. Bowel sounds are normal. There is no distension.     Palpations: Abdomen is soft. There is no hepatomegaly, splenomegaly or mass.  Musculoskeletal:        General: Normal range of motion.     Cervical back: Neck supple.     Right lower leg: No edema.     Left lower leg: No edema.  Lymphadenopathy:     Cervical: No cervical adenopathy.  Skin:    General: Skin is warm and dry.  Neurological:     General: No focal deficit present.     Mental Status: He is alert.  Psychiatric:        Mood and Affect: Mood normal.        Behavior: Behavior normal.     Lab Results  Component Value Date   WBC 5.9 10/17/2020   HGB 12.0 (L) 10/17/2020   HCT 35.4 (L) 10/17/2020   PLT 170.0 10/17/2020   GLUCOSE 88 10/17/2020   CHOL 133 01/04/2020   TRIG 85 01/04/2020   HDL 44 01/04/2020   LDLCALC 72 01/04/2020   ALT 19 07/17/2020   AST 18 07/17/2020   NA 137 10/17/2020   K 4.5 10/17/2020   CL 105 10/17/2020   CREATININE 1.58 (H) 10/17/2020   BUN 32 (H) 10/17/2020   CO2 26 10/17/2020   TSH 2.32 07/17/2020   PSA 2.55 08/21/2015   HGBA1C 7.9 (H) 10/17/2020   MICROALBUR 10.0 (H) 10/17/2020    CT Head Wo Contrast  Result Date: 07/23/2020 CLINICAL DATA:  Fall last week on blood thinners, dizziness. EXAM: CT HEAD WITHOUT CONTRAST TECHNIQUE: Contiguous axial images were obtained from the base of the skull through the vertex without intravenous contrast. COMPARISON:  None. FINDINGS: Brain: Age related mild global parenchymal volume loss with ex vacuo dilatation of the ventricular system. Mild sequela of chronic small vessel ischemic white matter disease. Chronic lacunar type infarct in the left centrum semiovale. No evidence of acute infarction, hemorrhage, hydrocephalus, extra-axial  collection or mass lesion/mass effect. Vascular: No hyperdense vessel or unexpected calcification. Atherosclerotic calcifications of the carotid siphon. Skull: Normal. Negative for fracture or focal lesion. Sinuses/Orbits: No acute finding. Other: None. IMPRESSION: 1. No acute intracranial findings. 2. Mild global parenchymal volume loss and mild sequela chronic small vessel ischemic white matter disease. Electronically Signed   By: Dahlia Bailiff MD   On: 07/23/2020 16:26    Assessment & Plan:   Hazen was seen today for diabetes.  Diagnoses and all orders for this visit:  Hypertension, unspecified type- His BP is well  controlled. -     Basic metabolic panel; Future -     Urinalysis, Routine w reflex microscopic; Future -     Urinalysis, Routine w reflex microscopic -     Basic metabolic panel  Diabetic polyneuropathy associated with type 2 diabetes mellitus ([place])  Type II diabetes mellitus with manifestations ([place])- His blood sugar is well controlled. -     Basic metabolic panel; Future -     Microalbumin / creatinine urine ratio; Future -     Hemoglobin A1c; Future -     Hemoglobin A1c -     Microalbumin / creatinine urine ratio -     Basic metabolic panel  Stage 3 chronic kidney disease, unspecified whether stage 3a or 3b CKD ([place])- His renal function is stable. -     CBC with Differential/Platelet; Future -     Basic metabolic panel; Future -     Microalbumin / creatinine urine ratio; Future -     Urinalysis, Routine w reflex microscopic; Future -     Urinalysis, Routine w reflex microscopic -     Microalbumin / creatinine urine ratio -     Basic metabolic panel -     CBC with Differential/Platelet   I am having Dale Anderson maintain his Vitamin D, blood glucose meter kit and supplies, Lancets, FreeStyle Lite, FREESTYLE LITE, Multiple Vitamins-Minerals (CENTRUM SILVER PO), ALPRAZolam, FreeStyle Libre 2 Reader, FreeStyle Libre 2 Sensor, tamsulosin, Glucagon,  pantoprazole, atorvastatin, L-Methylfolate-Algae-B12-B6, Insulin Pen Needle, apixaban, metoprolol succinate, and Insulin Glargine-yfgn.  No orders of the defined types were placed in this encounter.    Follow-up: Return in about 6 months (around 04/18/2021).  Scarlette Calico, MD

## 2020-10-17 NOTE — Patient Instructions (Signed)
Type 2 Diabetes Mellitus, Diagnosis, Adult Type 2 diabetes (type 2 diabetes mellitus) is a long-term, or chronic, disease. In type 2 diabetes, one or both of these problems may be present:  The pancreas does not make enough of a hormone called insulin.  Cells in the body do not respond properly to insulin that the body makes (insulin resistance). Normally, insulin allows blood sugar (glucose) to enter cells in the body. The cells use glucose for energy. Insulin resistance or lack of insulin causes excess glucose to build up in the blood instead of going into cells. This causes high blood glucose (hyperglycemia).  What are the causes? The exact cause of type 2 diabetes is not known. What increases the risk? The following factors may make you more likely to develop this condition:  Having a family member with type 2 diabetes.  Being overweight or obese.  Being inactive (sedentary).  Having been diagnosed with insulin resistance.  Having a history of prediabetes, diabetes when you were pregnant (gestational diabetes), or polycystic ovary syndrome (PCOS). What are the signs or symptoms? In the early stage of this condition, you may not have symptoms. Symptoms develop slowly and may include:  Increased thirst or hunger.  Increased urination.  Unexplained weight loss.  Tiredness (fatigue) or weakness.  Vision changes, such as blurry vision.  Dark patches on the skin. How is this diagnosed? This condition is diagnosed based on your symptoms, your medical history, a physical exam, and your blood glucose level. Your blood glucose may be checked with one or more of the following blood tests:  A fasting blood glucose (FBG) test. You will not be allowed to eat (you will fast) for 8 hours or longer before a blood sample is taken.  A random blood glucose test. This test checks blood glucose at any time of day regardless of when you ate.  An A1C (hemoglobin A1C) blood test. This test  provides information about blood glucose levels over the previous 2-3 months.  An oral glucose tolerance test (OGTT). This test measures your blood glucose at two times: ? After fasting. This is your baseline blood glucose level. ? Two hours after drinking a beverage that contains glucose. You may be diagnosed with type 2 diabetes if:  Your fasting blood glucose level is 126 mg/dL (7.0 mmol/L) or higher.  Your random blood glucose level is 200 mg/dL (11.1 mmol/L) or higher.  Your A1C level is 6.5% or higher.  Your oral glucose tolerance test result is higher than 200 mg/dL (11.1 mmol/L). These blood tests may be repeated to confirm your diagnosis.   How is this treated? Your treatment may be managed by a specialist called an endocrinologist. Type 2 diabetes may be treated by following instructions from your health care provider about:  Making dietary and lifestyle changes. These may include: ? Following a personalized nutrition plan that is developed by a registered dietitian. ? Exercising regularly. ? Finding ways to manage stress.  Checking your blood glucose level as often as told.  Taking diabetes medicines or insulin daily. This helps to keep your blood glucose levels in the healthy range.  Taking medicines to help prevent complications from diabetes. Medicines may include: ? Aspirin. ? Medicine to lower cholesterol. ? Medicine to control blood pressure. Your health care provider will set treatment goals for you. Your goals will be based on your age, other medical conditions you have, and how you respond to diabetes treatment. Generally, the goal of treatment is to maintain the   following blood glucose levels:  Before meals: 80-130 mg/dL (4.4-7.2 mmol/L).  After meals: below 180 mg/dL (10 mmol/L).  A1C level: less than 7%. Follow these instructions at home: Questions to ask your health care provider Consider asking the following questions:  Should I meet with a certified  diabetes care and education specialist?  What diabetes medicines do I need, and when should I take them?  What equipment will I need to manage my diabetes at home?  How often do I need to check my blood glucose?  Where can I find a support group for people with diabetes?  What number can I call if I have questions?  When is my next appointment? General instructions  Take over-the-counter and prescription medicines only as told by your health care provider.  Keep all follow-up visits as told by your health care provider. This is important. Where to find more information  American Diabetes Association (ADA): www.diabetes.org  American Association of Diabetes Care and Education Specialists (ADCES): www.diabeteseducator.org  International Diabetes Federation (IDF): www.idf.org Contact a health care provider if:  Your blood glucose is at or above 240 mg/dL (13.3 mmol/L) for 2 days in a row.  You have been sick or have had a fever for 2 days or longer, and you are not getting better.  You have any of the following problems for more than 6 hours: ? You cannot eat or drink. ? You have nausea and vomiting. ? You have diarrhea. Get help right away if:  You have severe hypoglycemia. This means your blood glucose is lower than 54 mg/dL (3.0 mmol/L).  You become confused or you have trouble thinking clearly.  You have difficulty breathing.  You have moderate or large ketone levels in your urine. These symptoms may represent a serious problem that is an emergency. Do not wait to see if the symptoms will go away. Get medical help right away. Call your local emergency services (911 in the [place] Do not drive yourself to the hospital. Summary  Type 2 diabetes (type 2 diabetes mellitus) is a long-term, or chronic, disease. In type 2 diabetes, the pancreas does not make enough of a hormone called insulin, or cells in the body do not respond properly to insulin that the body makes (insulin  resistance).  This condition is treated by making dietary and lifestyle changes and taking diabetes medicines or insulin.  Your health care provider will set treatment goals for you. Your goals will be based on your age, other medical conditions you have, and how you respond to diabetes treatment.  Keep all follow-up visits as told by your health care provider. This is important. This information is not intended to replace advice given to you by your health care provider. Make sure you discuss any questions you have with your health care provider. Document Revised: 01/04/2020 Document Reviewed: 01/04/2020 Elsevier Patient Education  2021 Elsevier Inc.  

## 2020-10-18 ENCOUNTER — Telehealth: Payer: Self-pay | Admitting: Pharmacist

## 2020-10-18 NOTE — Progress Notes (Signed)
    Chronic Care Management Pharmacy Assistant   Name: Dale Anderson  MRN: [id number] DOB: 1930/05/17  .  Reason for Encounter: Chart Review    Medications: Outpatient Encounter Medications as of 10/18/2020  Medication Sig Note  . ALPRAZolam (XANAX) 0.5 MG tablet TAKE 1 TABLET BY MOUTH DAILY AS NEEDED FOR ANXIETY   . apixaban (ELIQUIS) 2.5 MG TABS tablet TAKE 1 TABLET (2.5 MG TOTAL) BY MOUTH 2 (TWO) TIMES DAILY.   Marland Kitchen atorvastatin (LIPITOR) 20 MG tablet TAKE 1 TABLET BY MOUTH DAILY.   . blood glucose meter kit and supplies KIT Use to test blood sugar once daily. DX: E11.9   . Blood Glucose Monitoring Suppl (FREESTYLE LITE) DEVI    . Cholecalciferol (VITAMIN D) 1000 UNITS capsule Take 1,000 Units by mouth daily.   . Continuous Blood Gluc Receiver (FREESTYLE LIBRE 2 READER) DEVI USE AS DIRECTED   . Continuous Blood Gluc Sensor (FREESTYLE LIBRE 2 SENSOR) MISC USE AS DIRECTED   . Glucagon 1 MG/0.2ML SOAJ INJECT 1 PEN INTO THE SKIN DAILY AS NEEDED FOR LOW BLOOD SUGAR   . glucose blood (FREESTYLE LITE) test strip USE TO CHECK BLOOD SUGAR ONCE DAILY   . Insulin Glargine-yfgn (SEMGLEE, YFGN,) 100 UNIT/ML SOPN Inject 10 Units into the skin daily. (Patient taking differently: Inject 15 Units into the skin daily.)   . Insulin Pen Needle 31G X 6 MM MISC USE TO INJECT INSULIN 5 TIMES DAILY   . L-Methylfolate-Algae-B12-B6 3-90.314-2-35 MG CAPS TAKE 1 CAPSULE BY MOUTH 2 (TWO) TIMES DAILY. 08/15/2020: Not covered by insurance ~75 out of pocket  . Lancets MISC Use to test blood sugar once daily. DX E11.09   . metoprolol succinate (TOPROL-XL) 25 MG 24 hr tablet TAKE 1 TABLET (25 MG TOTAL) BY MOUTH DAILY.   . Multiple Vitamins-Minerals (CENTRUM SILVER PO) Take by mouth.   . pantoprazole (PROTONIX) 40 MG tablet TAKE 1 TABLET BY MOUTH ONCE DAILY.   . tamsulosin (FLOMAX) 0.4 MG CAPS capsule TAKE 1 CAPSULE BY MOUTH DAILY.   . [DISCONTINUED] telmisartan (MICARDIS) 40 MG tablet TAKE 1 TABLET BY MOUTH DAILY.     No facility-administered encounter medications on file as of 10/18/2020.   Pharmacist Review  Reviewed chart for medication changes and adherence.   No gaps in adherence identified. Patient has follow up scheduled with pharmacy team. No further action required.  [place] Pharmacist Assistant [phone]  Time spent:7

## 2020-10-22 ENCOUNTER — Telehealth: Payer: 59

## 2020-10-22 ENCOUNTER — Other Ambulatory Visit (HOSPITAL_COMMUNITY): Payer: Self-pay

## 2020-10-22 MED FILL — Continuous Glucose System Sensor: 28 days supply | Qty: 2 | Fill #0 | Status: AC

## 2020-10-23 ENCOUNTER — Telehealth: Payer: Self-pay | Admitting: *Deleted

## 2020-10-23 NOTE — Chronic Care Management (AMB) (Signed)
  Care Management   Note  10/23/2020 Name: Dale Anderson MRN: [id number] DOB: [birth year]  Dale Anderson is a 87 y.o. year old male who is a primary care patient of [name], MD and is actively engaged with the care management team. I reached out to [name] by phone today to assist with re-scheduling a follow up visit with the RN Case Manager.  Follow up plan: Unsuccessful telephone outreach attempt made.The care management team will reach out to the patient again over the next 7 days. If patient returns call to provider office, please advise to call [place] at [phone].  [place] Management

## 2020-10-30 ENCOUNTER — Telehealth: Payer: Self-pay | Admitting: *Deleted

## 2020-10-30 NOTE — Chronic Care Management (AMB) (Signed)
  Care Management   Note  10/30/2020 Name: HASHIM EICHHORST MRN: [id number] DOB: 1929/10/03  [name] [place] is a 87 y.o. year old male who is a primary care patient of [name], MD and is actively engaged with the care management team. I reached out to [name] by phone today to assist with scheduling a follow up visit with the RN Case Manager  Follow up plan: A second unsuccessful telephone outreach attempt made. A HIPAA compliant phone message was left for the patient providing contact information and requesting a return call. The care management team will reach out to the patient again over the next 7 days. If patient returns call to provider office, please advise to call [place] at [phone].  [place] Management

## 2020-10-31 ENCOUNTER — Ambulatory Visit (INDEPENDENT_AMBULATORY_CARE_PROVIDER_SITE_OTHER): Payer: 59 | Admitting: Pharmacist

## 2020-10-31 ENCOUNTER — Other Ambulatory Visit: Payer: Self-pay

## 2020-10-31 ENCOUNTER — Other Ambulatory Visit (HOSPITAL_COMMUNITY): Payer: Self-pay

## 2020-10-31 DIAGNOSIS — I4891 Unspecified atrial fibrillation: Secondary | ICD-10-CM

## 2020-10-31 DIAGNOSIS — I10 Essential (primary) hypertension: Secondary | ICD-10-CM | POA: Diagnosis not present

## 2020-10-31 DIAGNOSIS — E118 Type 2 diabetes mellitus with unspecified complications: Secondary | ICD-10-CM

## 2020-10-31 DIAGNOSIS — E785 Hyperlipidemia, unspecified: Secondary | ICD-10-CM

## 2020-10-31 DIAGNOSIS — N1832 Chronic kidney disease, stage 3b: Secondary | ICD-10-CM

## 2020-10-31 MED ORDER — OZEMPIC (0.25 OR 0.5 MG/DOSE) 2 MG/1.5ML ~~LOC~~ SOPN
PEN_INJECTOR | SUBCUTANEOUS | 0 refills | Status: DC
  Filled 2020-10-31: qty 1.5, 56d supply, fill #0

## 2020-10-31 MED ORDER — INSULIN GLARGINE-YFGN 100 UNIT/ML ~~LOC~~ SOPN: 15.0000 [IU] | PEN_INJECTOR | Freq: Every day | SUBCUTANEOUS | 1 refills | Status: DC

## 2020-10-31 MED FILL — Atorvastatin Calcium Tab 20 MG (Base Equivalent): ORAL | 90 days supply | Qty: 90 | Fill #0 | Status: AC

## 2020-10-31 NOTE — Patient Instructions (Signed)
Visit Information  Phone number for Pharmacist: [phone]  Goals Addressed            This Visit's Progress   . Manage My Medicine       Timeframe:  Long-Range Goal Priority:  High Start Date:      08/15/20                       Expected End Date:  11/12/20                     Follow Up Date 10/20/20   - call for medicine refill 2 or 3 days before it runs out - call if I am sick and can't take my medicine - keep a list of all the medicines I take; vitamins and herbals too  -Continue Semglee 15 units daily -Continue monitoring sugars with Freestyle Libre -Start Ozempic 0.25 mg once weekly (on Sundays) for 2 weeks   Why is this important?   . These steps will help you keep on track with your medicines.   Notes:       Patient verbalizes understanding of instructions provided today and agrees to view in [place].  Telephone follow up appointment with pharmacy team member scheduled for: 4 weeks  Charlene Brooke, PharmD, [place], CPP Clinical Pharmacist [facility] [phone]

## 2020-10-31 NOTE — Progress Notes (Signed)
Chronic Care Management Pharmacy Note  10/31/2020 Name:  Dale Anderson MRN:  [id number] DOB:  March 14, 1930  Subjective: Dale Anderson is an 87 y.o. year old male who is a primary patient of [name], MD.  The CCM team was consulted for assistance with disease management and care coordination needs.    Engaged with patient by telephone for follow up visit in response to provider referral for pharmacy case management and/or care coordination services.   Consent to Services:  The patient was given information about Chronic Care Management services, agreed to services, and gave verbal consent prior to initiation of services.  Please see initial visit note for detailed documentation.   Patient Care Team: [name], MD as PCP - General (Internal Medicine) Belva Crome, MD as PCP - Cardiology (Cardiology) Alda Berthold, DO as Consulting Physician (Neurology) Charlton Haws, [facility] as Pharmacist (Pharmacist) Knox Royalty, RN as [place] Management  Recent office visits: 10/17/20 Dr Ronnald Ramp OV: DM f/u; A1c 7.9; no med changes.  07/17/20 Dr Ronnald Ramp OV: chronic f/u, c/o lightheadedness. DC'd ARB d/t K 5.3. A1c up to 8.4, increased Tresiba to 30 units. Rx'd Gvoke. CT scan for recent fall negative for bleed or acute stroke.  Recent consult visits: 05/11/20 Dr Carolin Sicks (nephrology): pt recently started Eliquis, metoprolol for Afib. No med changes.  Hospital visits: None in previous 6 months  Objective:  Lab Results  Component Value Date   CREATININE 1.58 (H) 10/17/2020   BUN 32 (H) 10/17/2020   GFR 38.83 (L) 10/17/2020   GFRNONAA 56 (L) 05/19/2012   GFRAA 65 (L) 05/19/2012   NA 137 10/17/2020   K 4.5 10/17/2020   CALCIUM 9.1 10/17/2020   CO2 26 10/17/2020    Lab Results  Component Value Date/Time   HGBA1C 7.9 (H) 10/17/2020 04:00 PM   HGBA1C 8.4 (H) 07/17/2020 02:23 PM   HGBA1C 7.1 01/25/2016 12:00 AM   GFR 38.83 (L) 10/17/2020  04:00 PM   GFR 34.65 (L) 07/17/2020 02:23 PM   MICROALBUR 10.0 (H) 10/17/2020 04:00 PM   MICROALBUR 12.5 (H) 09/22/2019 10:34 AM    Last diabetic Eye exam:  Lab Results  Component Value Date/Time   HMDIABEYEEXA No Retinopathy 12/10/2018 12:00 AM    Last diabetic Foot exam:  Lab Results  Component Value Date/Time   HMDIABFOOTEX done 08/29/2013 12:00 AM     Lab Results  Component Value Date   CHOL 133 01/04/2020   HDL 44 01/04/2020   LDLCALC 72 01/04/2020   TRIG 85 01/04/2020   CHOLHDL 3.0 01/04/2020    Hepatic Function Latest Ref Rng & Units 07/17/2020 12/22/2018 05/24/2018  Total Protein 6.0 - 8.3 g/dL 7.8 7.5 7.5  Albumin 3.5 - 5.2 g/dL 4.4 4.3 4.2  AST 0 - 37 U/L '18 15 15  ' ALT 0 - 53 U/L '19 16 16  ' Alk Phosphatase 39 - 117 U/L 71 65 62  Total Bilirubin 0.2 - 1.2 mg/dL 1.1 1.0 0.7  Bilirubin, Direct 0.0 - 0.3 mg/dL 0.2 - -    Lab Results  Component Value Date/Time   TSH 2.32 07/17/2020 02:23 PM   TSH 4.28 01/04/2020 09:33 AM    CBC Latest Ref Rng & Units 10/17/2020 07/17/2020 01/12/2020  WBC 4.0 - 10.5 K/uL 5.9 6.6 6.7  Hemoglobin 13.0 - 17.0 g/dL 12.0(L) 13.2 11.9(L)  Hematocrit 39.0 - 52.0 % 35.4(L) 39.6 34.9(L)  Platelets 150.0 - 400.0 K/uL 170.0 209.0 202  No results found for: VD25OH  Clinical ASCVD: No  The ASCVD Risk score Mikey Bussing DC Jr., et al., 2013) failed to calculate for the following reasons:   The 2013 ASCVD risk score is only valid for ages 74 to 68    Depression screen PHQ 2/9 10/17/2020 08/13/2020 07/17/2020  Decreased Interest 0 0 0  Down, Depressed, Hopeless 0 0 0  PHQ - 2 Score 0 0 0    GAD 7 : Generalized Anxiety Score 05/25/2018 11/24/2016  Nervous, Anxious, on Edge 2 0  Control/stop worrying 0 0  Worry too much - different things 1 0  Trouble relaxing 1 0  Restless 1 0  Easily annoyed or irritable 0 0  Afraid - awful might happen 0 0  Total GAD 7 Score 5 0   CHA2DS2-VASc Score = 4  The patient's score is based upon: CHF History: No HTN  History: Yes Diabetes History: Yes Stroke History: No Vascular Disease History: No Age Score: 2 Gender Score: 0     Social History   Tobacco Use  Smoking Status Never Smoker  Smokeless Tobacco Never Used   BP Readings from Last 3 Encounters:  10/17/20 (!) 148/72  07/17/20 (!) 156/88  03/27/20 (!) 144/82   Pulse Readings from Last 3 Encounters:  10/17/20 (!) 56  07/17/20 87  03/27/20 63   Wt Readings from Last 3 Encounters:  10/17/20 167 lb 9.6 oz (76 kg)  07/17/20 166 lb (75.3 kg)  03/27/20 168 lb (76.2 kg)    Assessment/Interventions: Review of patient past medical history, allergies, medications, health status, including review of consultants reports, laboratory and other test data, was performed as part of comprehensive evaluation and provision of chronic care management services.   SDOH:  (Social Determinants of Health) assessments and interventions performed: Yes   CCM Care Plan  Allergies  Allergen Reactions  . Lisinopril Cough    Medications Reviewed Today    Reviewed by Charlton Haws, [facility] (Pharmacist) on 10/31/20 at 1647  Med List Status: <None>  Medication Order Taking? Sig Documenting Provider Last Dose Status Informant  ALPRAZolam (XANAX) 0.5 MG tablet [id number] Yes TAKE 1 TABLET BY MOUTH DAILY AS NEEDED FOR ANXIETY [name], MD Taking Active   apixaban (ELIQUIS) 2.5 MG TABS tablet [id number] Yes TAKE 1 TABLET (2.5 MG TOTAL) BY MOUTH 2 (TWO) TIMES DAILY. [name], MD Taking Active   atorvastatin (LIPITOR) 20 MG tablet [id number] Yes TAKE 1 TABLET BY MOUTH DAILY. [name], MD Taking Active   blood glucose meter kit and supplies KIT [id number] Yes Use to test blood sugar once daily. DX: E11.9 [name], MD Taking Active   Blood Glucose Monitoring Suppl ([address] [place]) DEVI [id number] Yes  [provider] Taking Active   Cholecalciferol (VITAMIN D) 1000 UNITS capsule [id number] Yes Take 1,000 Units by mouth daily.  [provider] Taking Active            Med Note Thurmond Butts, Johny Drilling   Fri Jan 16, 2017 10:06 AM)    Continuous Blood Gluc Receiver (FREESTYLE LIBRE 2 READER) DEVI [id number] Yes USE AS DIRECTED [name], MD Taking Active   Continuous Blood Gluc Sensor (FREESTYLE LIBRE 2 SENSOR) [place] [id number] Yes USE AS DIRECTED [name], MD Taking Active   Glucagon 1 MG/0.2ML Darden Palmer [id number] Yes INJECT 1 PEN INTO THE SKIN DAILY AS NEEDED FOR LOW BLOOD SUGAR [name], MD Taking Active   glucose blood (FREESTYLE LITE) test  strip [id number] Yes USE TO CHECK BLOOD SUGAR ONCE DAILY [name], MD Taking Active   Insulin Glargine-yfgn (SEMGLEE, YFGN,) 100 UNIT/ML SOPN [id number] Yes Inject 15 Units into the skin daily. [name], MD Taking Active   Insulin Pen Needle 31G X 6 MM MISC [id number] Yes USE TO INJECT INSULIN 5 TIMES DAILY [name], MD Taking Active   L-Methylfolate-Algae-B12-B6 3-90.314-2-35 MG CAPS [id number] Yes TAKE 1 CAPSULE BY MOUTH 2 (TWO) TIMES DAILY. [name], MD Taking Active            Med Note Malena Catholic Aug 15, 2020  2:28 PM) Not covered by insurance ~75 out of pocket  Lancets MISC [id number] Yes Use to test blood sugar once daily. DX E11.09 [name], MD Taking Active   metoprolol succinate (TOPROL-XL) 25 MG 24 hr tablet [id number] Yes TAKE 1 TABLET (25 MG TOTAL) BY MOUTH DAILY. Belva Crome, MD Taking Active   Multiple Vitamins-Minerals (CENTRUM SILVER PO) [id number] Yes Take by mouth. [provider] Taking Active   pantoprazole (PROTONIX) 40 MG tablet [id number] Yes TAKE 1 TABLET BY MOUTH ONCE DAILY. [name], MD Taking Active   Semaglutide,0.25 or 0.5MG/DOS, (OZEMPIC, 0.25 OR 0.5 MG/DOSE,) 2 MG/1.5ML SOPN [id number] Yes Inject 0.25 mg into the skin once a week on Sundays for 4 weeks [name], MD  Active   tamsulosin [facility]) 0.4 MG CAPS capsule [id number] Yes TAKE 1 CAPSULE BY MOUTH DAILY. [name], MD Taking Active         Discontinued 07/17/20 1749 (Discontinued by provider)           Patient Active Problem List   Diagnosis Date Noted  . Encounter for general adult medical examination with abnormal findings 07/18/2020  . Atrial fibrillation by electrocardiogram ([place]) 01/12/2020  . Murmur, cardiac 01/04/2020  . Bradycardia 01/04/2020  . Internal hemorrhoid 12/08/2019  . Tinea cruris 12/05/2019  . Seborrheic dermatitis of scalp 05/05/2019  . Diabetic polyneuropathy associated with type 2 diabetes mellitus ([place]) 03/29/2019  . Seasonal allergic rhinitis due to pollen 05/24/2018  . Sciatica 10/21/2017  . Chronic renal disease, stage 3, moderately decreased glomerular filtration rate (GFR) between 30-59 mL/min/1.73 square meter (HCC) 07/02/2017  . Routine general medical examination at a health care facility 12/26/2013  . DDD (degenerative disc disease), cervical 12/26/2013  . Hypertension   . Hyperlipidemia with target LDL less than 100   . Type II diabetes mellitus with manifestations ([place])   . GERD (gastroesophageal reflux disease)   . DJD (degenerative joint disease)   . Anxiety     Immunization History  Administered Date(s) Administered  . Fluad Quad(high Dose 65+) 02/24/2019  . Influenza Split 03/05/2011, 03/23/2012  . Influenza Whole 03/23/2002, 05/16/2009  . Influenza, High Dose Seasonal PF 03/23/2017, 03/27/2018  . Influenza-Unspecified 04/23/2006, 03/23/2008, 04/23/2009, 03/23/2014, 03/23/2016, 03/23/2020  . PFIZER(Purple Top)SARS-COV-2 Vaccination 06/15/2019, 07/06/2019, 10/12/2020  . Pneumococcal Conjugate-13 12/26/2013  . Pneumococcal Polysaccharide-23 03/18/2006, 05/24/2018  . Pneumococcal-Unspecified 07/20/2005  . Tdap 04/08/2012  . Zoster 08/08/2004    Conditions to be addressed/monitored:  Hypertension, Hyperlipidemia, Diabetes, Atrial Fibrillation and Chronic Kidney Disease  Care Plan : [place]  Updates made by Charlton Haws, [place] since 10/31/2020 12:00 AM    Problem: Hypertension, Hyperlipidemia, Diabetes, Atrial Fibrillation, Chronic Kidney Disease   Priority: High    Long-Range Goal: Disease management   Start Date: 08/15/2020  Expected End Date: 02/12/2021  This Visit's Progress: On track  Recent Progress: Not on track  Priority: High  Note:   Current Barriers:  . Unable to independently monitor therapeutic efficacy . Unable to achieve control of diabetes  . Suboptimal therapeutic regimen for diabetes  Pharmacist Clinical Goal(s):  Marland Kitchen Patient will achieve adherence to monitoring guidelines and medication adherence to achieve therapeutic efficacy . achieve control of diabetes as evidenced by improvement in blood sugar readings . adhere to plan to optimize therapeutic regimen for diabetes as evidenced by report of adherence to recommended medication management changes through collaboration with PharmD and provider.   Interventions: . 1:1 collaboration with [name], MD regarding development and update of comprehensive plan of care as evidenced by provider attestation and co-signature . Inter-disciplinary care team collaboration (see longitudinal plan of care) . Comprehensive medication review performed; medication list updated in electronic medical record  Hypertension (BP goal <140/90) -Not ideally controlled - pt is not checking BP at home and it has been above goal in office recently -Current treatment: . Metoprolol succinate 25 mg daily -Medications previously tried: chlorthalidone, lisinopril, telmisartan -Current home readings: not checking -Current exercise habits: walks hospital hallways at work -Denies hypotensive/hypertensive symptoms -Educated on BP goals and benefits of medications for prevention of heart attack, stroke and kidney damage; Daily salt intake goal < 2300 mg; Exercise goal of 150 minutes per week; Importance of home blood pressure monitoring; -Counseled to  monitor BP at home daily, document, and provide log at future appointments -Recommended to continue current medication  Hyperlipidemia: (LDL goal < 100) -Controlled -Current treatment: . Atorvastatin 20 mg daily -Educated on Cholesterol goals;  Benefits of statin for ASCVD risk reduction; -Recommended to continue current medication  Diabetes (A1c goal <7%) -Improved -A1c at recent PCP appt improved to 7.9%. Pt reports Freestyle Elenor Legato is working well and he is compliant with insulin; he reports BG spikes high after eating; he endorses dietary indiscretions (fast food, cornbread) that raise BG significantly; he denies hypoglycemia -Current medications: . Semglee 15 units daily . Glucagon (Gvoke Hypopen) PRN . Freestyle Libre 2 -Medications previously tried: Barista, metformin  -Current home glucose readings . fasting glucose: 125-146 . Post-prandial glucose: 240-260 -Denies hypoglycemic/hyperglycemic symptoms -Counseled on dangers of highly variable sugars, consistent elevations in BG; pt will likely benefit from GLP-1 agonist that targets post-prandial BG; in the interest of minimizing injections, will start with once weekly Ozempic. Counseled extensively on how and when to administer Ozempic, pt voiced understanding. -Plan: Continue medications as above. Start Ozempic 0.25 mg weekly x 4 weeks. F/U with pharmacist in 4 weeks before increasing dose.  Atrial Fibrillation (Goal: prevent stroke and major bleeding) -Controlled -CHADSVASC: 4 -Current treatment: . Rate control: Metoprolol succinate 25 mg daily . Anticoagulation: Eliquis 2.5 mg BID -Counseled on increased risk of stroke due to Afib and benefits of anticoagulation for stroke prevention; importance of adherence to anticoagulant exactly as prescribed; avoidance of NSAIDs due to increased bleeding risk with anticoagulants; seeking medical attention after a head injury or if there is blood in the urine/stool; -Recommended to  continue current medication  Patient Goals/Self-Care Activities . Patient will:  - take medications as prescribed focus on medication adherence by routine check glucose via [facility], document, and provide at future appointments check blood pressure daily, document, and provide at future appointments -Continue Semglee 15 units daily. Start Ozempic 0.25 mg once weekly (Sundays) for 4 weeks  Follow Up Plan: Telephone follow up appointment with care management team member scheduled for:  1 month       Medication Assistance: None required.  Patient affirms current coverage meets needs.  Patient's preferred pharmacy is:  [facility] [address] [place] [address] Phone: [phone] Fax: [phone]  Uses pill box? No - prefers bottles Pt endorses 100% compliance  We discussed: Current pharmacy is preferred with insurance plan and patient is satisfied with pharmacy services Patient decided to: Continue current medication management strategy  Care Plan and Follow Up Patient Decision:  Patient agrees to Care Plan and Follow-up.  Plan: Telephone follow up appointment with care management team member scheduled for:  1 month  Charlene Brooke, PharmD, [facility] Clinical Pharmacist [facility] at [facility] [phone]

## 2020-10-31 NOTE — Chronic Care Management (AMB) (Signed)
  Care Management   Note  10/31/2020 Name: BROOKS KINNAN MRN: [id number] DOB: Dec 16, 1929  [name] Mcconnon is a 87 y.o. year old male who is a primary care patient of [name], MD and is actively engaged with the care management team. I reached out to [name] by phone today to assist with re-scheduling a follow up visit with the RN Case Manager.  Follow up plan: Telephone appointment with care management team member scheduled for:11/06/2020  [place] Management

## 2020-10-31 NOTE — Progress Notes (Unsigned)
Chronic Care Management Pharmacy Note  10/31/2020 Name:  Dale Anderson MRN:  [id number] DOB:  28-Oct-1929  Subjective: Dale Anderson is an 87 y.o. year old male who is a primary patient of [name], MD.  The CCM team was consulted for assistance with disease management and care coordination needs.    Engaged with patient face to face for follow up visit in response to provider referral for pharmacy case management and/or care coordination services.   Consent to Services:  The patient was given information about Chronic Care Management services, agreed to services, and gave verbal consent prior to initiation of services.  Please see initial visit note for detailed documentation.   Patient Care Team: [name], MD as PCP - General (Internal Medicine) Belva Crome, MD as PCP - Cardiology (Cardiology) Alda Berthold, DO as Consulting Physician (Neurology) Charlton Haws, [facility] as Pharmacist (Pharmacist) Knox Royalty, RN as [facility] Management  Recent office visits: 10/17/20 Dr Ronnald Ramp OV: DM f/u. A1c 7.9. No med changes.  07/17/20 Dr Ronnald Ramp OV: chronic f/u, c/o lightheadedness. DC'd ARB d/t K 5.3. A1c up to 8.4, increased Tresiba to 30 units. Rx'd Gvoke. CT scan for recent fall negative for bleed or acute stroke.  Recent consult visits: 05/11/20 Dr Carolin Sicks (nephrology): pt recently started Eliquis, metoprolol for Afib. No med changes.  Hospital visits: None in previous 6 months  Objective:  Lab Results  Component Value Date   CREATININE 1.58 (H) 10/17/2020   BUN 32 (H) 10/17/2020   GFR 38.83 (L) 10/17/2020   GFRNONAA 56 (L) 05/19/2012   GFRAA 65 (L) 05/19/2012   NA 137 10/17/2020   K 4.5 10/17/2020   CALCIUM 9.1 10/17/2020   CO2 26 10/17/2020    Lab Results  Component Value Date/Time   HGBA1C 7.9 (H) 10/17/2020 04:00 PM   HGBA1C 8.4 (H) 07/17/2020 02:23 PM   HGBA1C 7.1 01/25/2016 12:00 AM   GFR 38.83 (L) 10/17/2020  04:00 PM   GFR 34.65 (L) 07/17/2020 02:23 PM   MICROALBUR 10.0 (H) 10/17/2020 04:00 PM   MICROALBUR 12.5 (H) 09/22/2019 10:34 AM    Last diabetic Eye exam:  Lab Results  Component Value Date/Time   HMDIABEYEEXA No Retinopathy 12/10/2018 12:00 AM    Last diabetic Foot exam:  Lab Results  Component Value Date/Time   HMDIABFOOTEX done 08/29/2013 12:00 AM     Lab Results  Component Value Date   CHOL 133 01/04/2020   HDL 44 01/04/2020   LDLCALC 72 01/04/2020   TRIG 85 01/04/2020   CHOLHDL 3.0 01/04/2020    Hepatic Function Latest Ref Rng & Units 07/17/2020 12/22/2018 05/24/2018  Total Protein 6.0 - 8.3 g/dL 7.8 7.5 7.5  Albumin 3.5 - 5.2 g/dL 4.4 4.3 4.2  AST 0 - 37 U/L _0 ALT 0 - 53 U/L _1 Alk Phosphatase 39 - 117 U/L 71 65 62  Total Bilirubin 0.2 - 1.2 mg/dL 1.1 1.0 0.7  Bilirubin, Direct 0.0 - 0.3 mg/dL 0.2 - -    Lab Results  Component Value Date/Time   TSH 2.32 07/17/2020 02:23 PM   TSH 4.28 01/04/2020 09:33 AM    CBC Latest Ref Rng & Units 10/17/2020 07/17/2020 01/12/2020  WBC 4.0 - 10.5 K/uL 5.9 6.6 6.7  Hemoglobin 13.0 - 17.0 g/dL 12.0(L) 13.2 11.9(L)  Hematocrit 39.0 - 52.0 % 35.4(L) 39.6 34.9(L)  Platelets 150.0 - 400.0 K/uL 170.0 209.0 202  No results found for: VD25OH  Clinical ASCVD: No  The ASCVD Risk score Mikey Bussing DC Jr., et al., 2013) failed to calculate for the following reasons:   The 2013 ASCVD risk score is only valid for ages 68 to 16    Depression screen PHQ 2/9 10/17/2020 08/13/2020 07/17/2020  Decreased Interest 0 0 0  Down, Depressed, Hopeless 0 0 0  PHQ - 2 Score 0 0 0    GAD 7 : Generalized Anxiety Score 05/25/2018 11/24/2016  Nervous, Anxious, on Edge 2 0  Control/stop worrying 0 0  Worry too much - different things 1 0  Trouble relaxing 1 0  Restless 1 0  Easily annoyed or irritable 0 0  Afraid - awful might happen 0 0  Total GAD 7 Score 5 0   CHA2DS2-VASc Score = 4  The patient's score is based upon: CHF History: No HTN  History: Yes Diabetes History: Yes Stroke History: No Vascular Disease History: No Age Score: 2 Gender Score: 0     Social History   Tobacco Use  Smoking Status Never Smoker  Smokeless Tobacco Never Used   BP Readings from Last 3 Encounters:  10/17/20 (!) 148/72  07/17/20 (!) 156/88  03/27/20 (!) 144/82   Pulse Readings from Last 3 Encounters:  10/17/20 (!) 56  07/17/20 87  03/27/20 63   Wt Readings from Last 3 Encounters:  10/17/20 167 lb 9.6 oz (76 kg)  07/17/20 166 lb (75.3 kg)  03/27/20 168 lb (76.2 kg)    Assessment/Interventions: Review of patient past medical history, allergies, medications, health status, including review of consultants reports, laboratory and other test data, was performed as part of comprehensive evaluation and provision of chronic care management services.   SDOH:  (Social Determinants of Health) assessments and interventions performed: Yes   CCM Care Plan  Allergies  Allergen Reactions  . Lisinopril Cough    Medications Reviewed Today    Reviewed by [name], MD (Physician) on 10/17/20 at 1554  Med List Status: <None>  Medication Order Taking? Sig Documenting Provider Last Dose Status Informant  ALPRAZolam (XANAX) 0.5 MG tablet [id number] No TAKE 1 TABLET BY MOUTH DAILY AS NEEDED FOR ANXIETY [name], MD Taking Active   apixaban (ELIQUIS) 2.5 MG TABS tablet [id number] No TAKE 1 TABLET (2.5 MG TOTAL) BY MOUTH 2 (TWO) TIMES DAILY. [name], MD Taking Active   atorvastatin (LIPITOR) 20 MG tablet [id number] No TAKE 1 TABLET BY MOUTH DAILY. [name], MD Taking Active   blood glucose meter kit and supplies KIT [id number] No Use to test blood sugar once daily. DX: E11.9 [name], MD Taking Active   Blood Glucose Monitoring Suppl (FREESTYLE LITE) DEVI [id number] No  [provider] Taking Active   Cholecalciferol (VITAMIN D) 1000 UNITS capsule [id number] No Take 1,000 Units by mouth daily. [provider] Taking Active            Med Note Thurmond Butts, Johny Drilling   Fri Jan 16, 2017 10:06 AM)    Continuous Blood Gluc Receiver (FREESTYLE LIBRE 2 READER) DEVI [id number] No USE AS DIRECTED [name], MD Taking Active   Continuous Blood Gluc Sensor (FREESTYLE LIBRE 2 SENSOR) MISC [id number] No USE AS DIRECTED [name], MD Taking Active   Glucagon 1 MG/0.2ML Darden Palmer [id number] No INJECT 1 PEN INTO THE SKIN DAILY AS NEEDED FOR LOW BLOOD SUGAR [name], MD Taking Active   glucose blood (FREESTYLE LITE) test  strip [id number] No USE TO CHECK BLOOD SUGAR ONCE DAILY [name], MD Taking Active   Insulin Glargine-yfgn (SEMGLEE, YFGN,) 100 UNIT/ML SOPN [id number] No Inject 10 Units into the skin daily.  Patient taking differently: Inject 15 Units into the skin daily.    Taking Active   Insulin Pen Needle 31G X 6 MM MISC [id number] No USE TO INJECT INSULIN 5 TIMES DAILY [name], MD Taking Active   L-Methylfolate-Algae-B12-B6 3-90.314-2-35 MG CAPS [id number] No TAKE 1 CAPSULE BY MOUTH 2 (TWO) TIMES DAILY. [name], MD Taking Active            Med Note Malena Catholic Aug 15, 2020  2:28 PM) Not covered by insurance ~75 out of pocket  Lancets MISC [id number] No Use to test blood sugar once daily. DX E11.09 [name], MD Taking Active   metoprolol succinate (TOPROL-XL) 25 MG 24 hr tablet [id number] No TAKE 1 TABLET (25 MG TOTAL) BY MOUTH DAILY. Belva Crome, MD Taking Active   Multiple Vitamins-Minerals (CENTRUM SILVER PO) [id number] No Take by mouth. [provider] Taking Active   pantoprazole (PROTONIX) 40 MG tablet [id number] No TAKE 1 TABLET BY MOUTH ONCE DAILY. [name], MD Taking Active   tamsulosin [facility]) 0.4 MG CAPS capsule [id number] No TAKE 1 CAPSULE BY MOUTH DAILY. [name], MD Taking Active         Discontinued 07/17/20 1749 (Discontinued by provider)           Patient Active Problem List   Diagnosis Date Noted  .  Encounter for general adult medical examination with abnormal findings 07/18/2020  . Atrial fibrillation by electrocardiogram ([place]) 01/12/2020  . Murmur, cardiac 01/04/2020  . Bradycardia 01/04/2020  . Internal hemorrhoid 12/08/2019  . Tinea cruris 12/05/2019  . Seborrheic dermatitis of scalp 05/05/2019  . Diabetic polyneuropathy associated with type 2 diabetes mellitus ([place]) 03/29/2019  . Seasonal allergic rhinitis due to pollen 05/24/2018  . Sciatica 10/21/2017  . Chronic renal disease, stage 3, moderately decreased glomerular filtration rate (GFR) between 30-59 mL/min/1.73 square meter (HCC) 07/02/2017  . Routine general medical examination at a health care facility 12/26/2013  . DDD (degenerative disc disease), cervical 12/26/2013  . Hypertension   . Hyperlipidemia with target LDL less than 100   . Type II diabetes mellitus with manifestations ([place])   . GERD (gastroesophageal reflux disease)   . DJD (degenerative joint disease)   . Anxiety     Immunization History  Administered Date(s) Administered  . Fluad Quad(high Dose 65+) 02/24/2019  . Influenza Split 03/05/2011, 03/23/2012  . Influenza Whole 03/23/2002, 05/16/2009  . Influenza, High Dose Seasonal PF 03/23/2017, 03/27/2018  . Influenza-Unspecified 04/23/2006, 03/23/2008, 04/23/2009, 03/23/2014, 03/23/2016, 03/23/2020  . PFIZER(Purple Top)SARS-COV-2 Vaccination 06/15/2019, 07/06/2019, 10/12/2020  . Pneumococcal Conjugate-13 12/26/2013  . Pneumococcal Polysaccharide-23 03/18/2006, 05/24/2018  . Pneumococcal-Unspecified 07/20/2005  . Tdap 04/08/2012  . Zoster 08/08/2004    Conditions to be addressed/monitored:  Hypertension, Hyperlipidemia, Diabetes, Atrial Fibrillation and Chronic Kidney Disease  Patient Care Plan: Diabetes Type 2 (Adult)    Problem Identified: Glycemic Management (Diabetes, Type 2)   Priority: High    Long-Range Goal: Glycemic Management Optimized   Start Date: 08/13/2020  Expected End Date:  11/11/2020  Recent Progress: On track  Priority: High  Note:   Objective:  Lab Results  Component Value Date   HGBA1C 8.4 (H) 07/17/2020 .   Lab Results  Component Value Date   CREATININE 1.74 (  H) 07/17/2020   CREATININE 1.56 (H) 01/04/2020   CREATININE 1.51 (H) 09/22/2019 .   Marland Kitchen No results found for: EGFR Current Barriers:  Marland Kitchen Knowledge Deficits related to basic Diabetes pathophysiology and self care/management: lacks understanding of significance of monitoring and reaching A1-C target; needs ongoing reeducation/ reinforcement of carb modified diet . Knowledge Deficits related to medications used for management of diabetes: lacks understanding of difference between long and short acting insulins . Knowledge Deficits related to self administration of injectable diabetes medications- recently discovered he was not using long acting insulin correctly and needs reinforcement and follow up with pharmacist; patient had not been removing cap of long-acting insulin and was not receiving prescribed dose, he has since been unable to re-fill this medication through his outpatient pharmacy Case Manager Clinical Goal(s):  Over the next 90 days, patient will demonstrate improved adherence to prescribed treatment plan for diabetes self care/management as evidenced by:  . daily monitoring and recording of CBG 3-4 times per day  . adherence to ADA/ carb modified diet  . adherence to prescribed medication regimen contacting provider for new or worsened symptoms or questions Interventions:  . Collaboration with [name], MD regarding development and update of comprehensive plan of care as evidenced by provider attestation and co-signature . Inter-disciplinary care team collaboration (see longitudinal plan of care); direct collaboration with Charlene Brooke, Pharm D regarding medication management concerns . Reviewed medications with patient post- recent CCM Pharmacist visit and updated EHR  accordingly; discussed importance of medication adherence, confirmed patient has contact information for CCM Pharmacist and encouraged him to maintain contact with CCM Pharmacist for any medication questions/ issues . Discussed plans with patient for ongoing care management follow up and confirmed patient has direct contact information for care management team . Confirmed patient has been monitoring/ recording blood sugars 3-4 times per day using newly acquired FSL/ CGM: reviewed with patient recent blood sugars at home today; he reports fasting ranges consistently between 116-170 and post- prandial values consistently between 180-200: confirmed today no recent signs/ symptoms/ values < 100 Patient Goals/Self-Care Activities UNABLE to independently verbalize A1-C goal and needs reinforcement around use of and purpose of medications for DM . Self administers medications as prescribed . Self administers insulin as prescribed . Continue checking blood sugars at home 3-4 times per day; write down all blood sugars and when you are taking your blood sugars: please write down what the "fasting (before eating/ morning)" blood sugars are and what the "2 hour after-eating" blood sugars are: these values help your doctor dose your insulin to make sure it is not too much or too little . check blood sugar if I feel it is too high or too low . take the blood sugar log to all doctor visits--- call your care providers if you have questions or concerns . If you have questions about what medications you should be taking, be  let Pharmacist Charlene Brooke at Dr. Ronnald Ramp office  Follow Up Plan:  . Telephone follow up appointment with care management team member scheduled for: Wednesday September 19, 2020 at 11:30 am . The patient has been provided with contact information for the care management team and has been advised to call with any health related questions or concerns.     Patient Care Plan: Hypertension (Adult)     Problem Identified: Hypertension (Hypertension)   Priority: Medium    Long-Range Goal: Hypertension Monitored   Start Date: 08/13/2020  Expected End Date: 11/11/2020  This Visit's  Progress: On track  Recent Progress: On track  Priority: Medium  Note:   Objective:  . Last practice recorded BP readings:  BP Readings from Last 3 Encounters:  07/17/20 (!) 156/88  03/27/20 (!) 144/82  03/19/20 (!) 144/66   . Most recent eGFR/CrCl: No results found for: EGFR  No components found for: CRCL Current Barriers:  Marland Kitchen Knowledge Deficits related to basic understanding of hypertension pathophysiology and self care management; patient adhering to medication and prescribed diet for HTN; could benefit from ongoing reinforcement of strategies for blood pressure management . Unable to independently verbalize blood pressures at home and blood pressure target ranges Case Manager Clinical Goal(s):  Marland Kitchen Over the next 90 days, patient will demonstrate improved adherence to prescribed treatment plan for hypertension as evidenced by taking all medications as prescribed, monitoring and recording blood pressure 2- 3 times per week, adhering to low sodium/DASH diet Interventions:  . Collaboration with [name], MD regarding development and update of comprehensive plan of care as evidenced by provider attestation and co-signature . Inter-disciplinary care team collaboration (see longitudinal plan of care) . Reinforced previously provided education to patient re: DASH diet, complications of uncontrolled blood pressure; how diabetes affects blood pressure: patient requires ongoing reinforcement . Discussed plans with patient for ongoing care management follow up and provided patient with direct contact information for care management team . Advised patient, providing education and rationale, to monitor blood pressure 2-3 times per week and record, calling care providers for questions or concerns: attempted to review  recently recorded blood pressures with patient, however, he declines same; states he does not have his BP log with him- he verbally agrees to have at time of next scheduled call; positive reinforcement provided to patient for initiating regular blood pressure monitoring at home . Confirmed no recent changes to BP medications, patient continues taking as prescribed . Discussed Advanced Directives with patient and provided written educational material for his review Patient Goals/Self-Care Activities . Self administers medications as prescribed . Attends all scheduled provider appointments . Calls provider office for new concerns, questions, or BP outside discussed parameters . Follows a low sodium diet/DASH diet . Continue checking blood pressure 2- 3 times per week . Write blood pressure results in a log or diary: we will review these next time we talk  . Continue eating a low-salt, heart healthy diet- this helps manage your diabetes and your blood pressure Follow Up Plan:  . Telephone follow up appointment with care management team member scheduled for: September 19, 2020 at 11:30 am . The patient has been provided with contact information for the care management team and has been advised to call with any health related questions or concerns.     Patient Care Plan: CCM Pharmacy Care Plan    Problem Identified: Hypertension, Hyperlipidemia, Diabetes, Atrial Fibrillation, Chronic Kidney Disease   Priority: High    Long-Range Goal: Disease management   Start Date: 08/15/2020  Expected End Date: 02/12/2021  This Visit's Progress: Not on track  Recent Progress: On track  Priority: High  Note:   Current Barriers:  . Unable to independently monitor therapeutic efficacy . Unable to achieve control of diabetes  . Suboptimal therapeutic regimen for diabetes  Pharmacist Clinical Goal(s):  Marland Kitchen Over the next 30 days, patient will achieve adherence to monitoring guidelines and medication adherence to  achieve therapeutic efficacy . achieve control of diabetes as evidenced by improvement in blood sugar readings . adhere to plan to optimize therapeutic regimen  for diabetes as evidenced by report of adherence to recommended medication management changes through collaboration with PharmD and provider.   Interventions: . 1:1 collaboration with [name], MD regarding development and update of comprehensive plan of care as evidenced by provider attestation and co-signature . Inter-disciplinary care team collaboration (see longitudinal plan of care) . Comprehensive medication review performed; medication list updated in electronic medical record  Hypertension (BP goal <140/90) -Not ideally controlled - pt is not checking BP at home and it has been above goal in office recently -Current treatment: . Metoprolol succinate 25 mg daily -Medications previously tried: chlorthalidone, lisinopril, telmisartan -Current home readings: not checking -Current exercise habits: walks hospital hallways at work -Denies hypotensive/hypertensive symptoms -Educated on BP goals and benefits of medications for prevention of heart attack, stroke and kidney damage; Daily salt intake goal < 2300 mg; Exercise goal of 150 minutes per week; Importance of home blood pressure monitoring; -Counseled to monitor BP at home daily, document, and provide log at future appointments -Recommended to continue current medication  Hyperlipidemia: (LDL goal < 100) -Controlled -Current treatment: . Atorvastatin 20 mg daily -Educated on Cholesterol goals;  Benefits of statin for ASCVD risk reduction; -Recommended to continue current medication  Diabetes (A1c goal <7%) -Uncontrolled -pt was taking Basaglar 10 units for 10 days, when he ran out he did not refill [name] as instructed last visit. He was told at the pharmacy that his insurance will cover [facility] for $0 copay and he requests this be prescribed. He did pick up  [employer] and his family member who is a Marine scientist showed him how to use it. He reports BG 142 today and has been up and down since he hasn't been taking insulin. -Current medications: . Basaglar 10 units daily - no longer taking (supply ran out) . Humalog 5 units w/ meals - on hold  . Glucagon (Gvoke Hypopen) PRN . Freestyle Lite test strips -Medications previously tried: Synjardy, metformin  -Current home glucose readings . fasting glucose: 142 today -Denies hypoglycemic/hyperglycemic symptoms -Educated onA1c and blood sugar goals; Proper insulin injection technique; Benefits of routine self-monitoring of blood sugar;  -Start Semglee 10 units daily at bedtime. Continue to hold Humalog.  Atrial Fibrillation (Goal: prevent stroke and major bleeding) -Controlled -CHADSVASC: 4 -Current treatment: . Rate control: Metoprolol succinate 25 mg daily . Anticoagulation: Eliquis 2.5 mg BID -Counseled on increased risk of stroke due to Afib and benefits of anticoagulation for stroke prevention; importance of adherence to anticoagulant exactly as prescribed; avoidance of NSAIDs due to increased bleeding risk with anticoagulants; seeking medical attention after a head injury or if there is blood in the urine/stool; -Recommended to continue current medication  Patient Goals/Self-Care Activities . Over the next 30 days, patient will:  - take medications as prescribed focus on medication adherence by routine check glucose 3 times daily, document, and provide at future appointments check blood pressure daily, document, and provide at future appointments  Follow Up Plan: Telephone follow up appointment with care management team member scheduled for: 1 month     Medication Assistance: None required.  Patient affirms current coverage meets needs.  Patient's preferred pharmacy is:  [facility] [address] [place] [address] Phone: [phone] Fax:  [phone]  Uses pill box? No - prefers bottles Pt endorses 100% compliance  We discussed: Current pharmacy is preferred with insurance plan and patient is satisfied with pharmacy services Patient decided to: Continue current medication management strategy  Care Plan and  Follow Up Patient Decision:  Patient agrees to Care Plan and Follow-up.  Plan: Telephone follow up appointment with care management team member scheduled for:  1 month  Charlene Brooke, PharmD, [facility] Clinical Pharmacist [facility] at [facility] [phone]

## 2020-11-01 ENCOUNTER — Other Ambulatory Visit (HOSPITAL_COMMUNITY): Payer: Self-pay

## 2020-11-01 ENCOUNTER — Ambulatory Visit: Payer: 59

## 2020-11-02 ENCOUNTER — Other Ambulatory Visit (HOSPITAL_COMMUNITY): Payer: Self-pay

## 2020-11-06 ENCOUNTER — Telehealth: Payer: Self-pay | Admitting: Pharmacist

## 2020-11-06 ENCOUNTER — Telehealth: Payer: 59

## 2020-11-06 ENCOUNTER — Telehealth: Payer: Self-pay | Admitting: *Deleted

## 2020-11-06 ENCOUNTER — Encounter: Payer: Self-pay | Admitting: *Deleted

## 2020-11-06 NOTE — Progress Notes (Signed)
Made three attempts to call patient for diabetic adherence call to discuss blood sugars, unable to reach patient.  [place] Pharmacist Assistant [phone]  Time spent:8

## 2020-11-06 NOTE — Telephone Encounter (Signed)
  Care Management   Follow Up Note   11/06/2020 Name: Dale Anderson MRN: [id number] DOB: Oct 10, 1929   Referred by: [name], MD Reason for referral : No chief complaint on file.  An unsuccessful telephone outreach was attempted today. The patient was referred to the case management team for assistance with care management and care coordination.    Attempted call to [phone] number x [phone], as requested by patient through care guide at time of scheduling: received automated outgoing voice message stating that phone number has a voice mail box that has not been set up yet; unable to leave HIPAA compliant voicemail message requesting call back.  Attempted both alternate numbers listed in Epic as patient's home and mobile numbers: both numbers unsuccessful and did not have voice mail options to leave HIPAA compliant voice message requesting call back.  Follow Up Plan:  The care management team will reach out to the patient again to reschedule: if patient does not return my call by end of day today, will make care guide aware of today's unsuccessful attempt to complete previously scheduled appointment, so outreach can be made to re-schedule.  [name], RN, BSN, [facility] RN Care Coordination- [place] [phone]: direct office [phone]: mobile

## 2020-11-09 ENCOUNTER — Telehealth: Payer: Self-pay | Admitting: *Deleted

## 2020-11-09 NOTE — Chronic Care Management (AMB) (Signed)
  Care Management   Note  11/09/2020 Name: Dale Anderson MRN: [id number] DOB: May 22, 1930  [name] is a 87 y.o. year old male who is a primary care patient of [name], MD and is actively engaged with the care management team. I reached out to [name] by phone today to assist with re-scheduling a follow up visit with the RN Case Manager  Follow up plan: Unsuccessful telephone outreach attempt made. The care management team will reach out to the patient again over the next 7 days. If patient returns call to provider office, please advise to call [place] at [phone].  [place] Management

## 2020-11-16 NOTE — Chronic Care Management (AMB) (Signed)
  Care Management   Note  11/16/2020 Name: Dale Anderson MRN: [id number] DOB: 1930-03-15  [name] is a 87 y.o. year old male who is a primary care patient of [name], MD and is actively engaged with the care management team. I reached out to [name] by phone today to assist with re-scheduling an initial visit with the RN Case Manager  Follow up plan: Second unsuccessful telephone outreach attempt made. The care management team will reach out to the patient again over the next 7 days. If patient returns call to provider office, please advise to call [place] at [phone].  [place] Management

## 2020-11-20 ENCOUNTER — Other Ambulatory Visit: Payer: Self-pay | Admitting: Internal Medicine

## 2020-11-20 ENCOUNTER — Other Ambulatory Visit (HOSPITAL_COMMUNITY): Payer: Self-pay

## 2020-11-20 DIAGNOSIS — I4891 Unspecified atrial fibrillation: Secondary | ICD-10-CM

## 2020-11-20 MED ORDER — APIXABAN 2.5 MG PO TABS
2.5000 mg | ORAL_TABLET | Freq: Two times a day (BID) | ORAL | 1 refills | Status: DC
  Filled 2020-11-20: qty 180, 90d supply, fill #0
  Filled 2021-04-29: qty 180, 90d supply, fill #1

## 2020-11-20 MED FILL — L-Methylfolate-Algae-Vit B12-B6 Cap 3-90.314-2-35 MG: ORAL | 30 days supply | Qty: 60 | Fill #1 | Status: AC

## 2020-11-20 MED FILL — Continuous Glucose System Sensor: 28 days supply | Qty: 2 | Fill #1 | Status: AC

## 2020-11-21 ENCOUNTER — Other Ambulatory Visit (HOSPITAL_COMMUNITY): Payer: Self-pay

## 2020-11-23 NOTE — Chronic Care Management (AMB) (Signed)
  Care Management   Note  11/23/2020 Name: LOYCE KLASEN MRN: [id number] DOB: 11/22/1929  [name] Brisbin is a 87 y.o. year old male who is a primary care patient of [name], MD and is actively engaged with the care management team. I reached out to [name] by phone today to assist with scheduling a follow up visit with the RN Case Manager  Follow up plan: Telephone appointment with care management team member scheduled for:11/29/2020  [place] Management

## 2020-11-27 ENCOUNTER — Other Ambulatory Visit (HOSPITAL_COMMUNITY): Payer: Self-pay

## 2020-11-27 ENCOUNTER — Telehealth (INDEPENDENT_AMBULATORY_CARE_PROVIDER_SITE_OTHER): Payer: 59 | Admitting: Family Medicine

## 2020-11-27 ENCOUNTER — Encounter: Payer: Self-pay | Admitting: Family Medicine

## 2020-11-27 VITALS — Temp 98.2°F

## 2020-11-27 DIAGNOSIS — R059 Cough, unspecified: Secondary | ICD-10-CM | POA: Diagnosis not present

## 2020-11-27 DIAGNOSIS — R0981 Nasal congestion: Secondary | ICD-10-CM

## 2020-11-27 MED ORDER — BENZONATATE 100 MG PO CAPS
100.0000 mg | ORAL_CAPSULE | Freq: Three times a day (TID) | ORAL | 0 refills | Status: DC | PRN
  Filled 2020-11-27: qty 20, 7d supply, fill #0

## 2020-11-27 NOTE — Progress Notes (Signed)
Virtual Visit via Telephone Note  I connected with Dale Anderson on 11/27/20 at 10:20 AM EDT by telephone and verified that I am speaking with the correct person using two identifiers.   I discussed the limitations, risks, security and privacy concerns of performing an evaluation and management service by telephone and the availability of in person appointments. I also discussed with the patient that there may be a patient responsible charge related to this service. The patient expressed understanding and agreed to proceed.  Location patient: home, [place] Location provider: work or home office Participants present for the call: patient, provider, patient's wife Patient did not have a visit with me in the prior 7 days to address this/these issue(s).   History of Present Illness:  Acute telemedicine visit for cough and congestion: -Onset: 1 week ago -covid testing was negative per patient report -Symptoms include: nasal congestion, cough, sneezing some -feeling better but still has a cough -Denies: CP, SOB, NVD, body aches, malaise, inability to eat/drink/get out of bed, known sick contacts -Has tried: OTC cough medicine but did not help -Pertinent past medical history: seasonal allergies -Pertinent medication allergies: Allergies  Allergen Reactions  . Lisinopril Cough  -COVID-19 vaccine status: fully vaccinated and booster   Past Medical History:  Diagnosis Date  . Adhesive capsulitis of right shoulder   . Anxiety   . Diabetes mellitus ([place])   . DJD (degenerative joint disease)   . GERD (gastroesophageal reflux disease)   . Hemorrhoid   . History of shingles   . Hyperlipidemia   . Hypertension   . Nodular prostate without urinary obstruction   . Recurrent bronchiectasis ([place])   . Tear of right rotator cuff     Observations/Objective: Patient sounds cheerful and well on the phone. I do not appreciate any SOB. Speech and thought processing are grossly intact. Patient  reported vitals:  Assessment and Plan:  Nasal congestion  Cough  -we discussed possible serious and likely etiologies, options for evaluation and workup, limitations of telemedicine visit vs in person visit, treatment, treatment risks and precautions. Pt prefers to treat via telemedicine empirically rather than in person at this moment.  Query VURI, seasonal allergies vs other. He opted for allegra for 1-2 weeks and Tessalon rx for cough.   Advised to seek prompt in person care if worsening, new symptoms arise, or if is not improving with treatment. Advised of options for inperson care in case PCP office not available. Did let the patient know that I only do telemedicine shifts for [place] on Tuesdays and Thursdays and advised a follow up visit with PCP or at an [facility] if has further questions or concerns.   Follow Up Instructions:  I did not refer this patient for an OV with me in the next 24 hours for this/these issue(s).  I discussed the assessment and treatment plan with the patient. The patient was provided an opportunity to ask questions and all were answered. The patient agreed with the plan and demonstrated an understanding of the instructions.   I spent 13 minutes on the date of this visit in the care of this patient. See summary of tasks completed to properly care for this patient in the detailed notes above which also included counseling of above, review of PMH, medications, allergies, evaluation of the patient and ordering and/or  instructing patient on testing and care options.     Dale Kern, DO

## 2020-11-27 NOTE — Patient Instructions (Signed)
  HOME CARE TIPS:   -I sent the medication(s) we discussed to your pharmacy: Meds ordered this encounter  Medications  . benzonatate (TESSALON PERLES) 100 MG capsule    Sig: Take 1 capsule (100 mg total) by mouth 3 (three) times daily as needed.    Dispense:  20 capsule    Refill:  0    -take Allegra once daily for 2 weeks - this is available over the counter  -can use nasal saline a few times per day if you have nasal congestion  -stay hydrated, drink plenty of fluids and eat small healthy meals - avoid dairy  -can take 1000 IU (64mcg) Vit D3 and 100-500 mg of Vit C daily per instructions  -follow up with your doctor in 2-3 days unless improving and feeling better  It was nice to meet you today, and I really hope you are feeling better soon. I help [place] out with telemedicine visits on Tuesdays and Thursdays and am available for visits on those days. If you have any concerns or questions following this visit please schedule a follow up visit with your Primary Care doctor or seek care at a local urgent care clinic to avoid delays in care.    Seek in person care or schedule a follow up video visit promptly if your symptoms worsen, new concerns arise or you are not improving with treatment. Call 911 and/or seek emergency care if your symptoms are severe or life threatening.

## 2020-11-28 ENCOUNTER — Ambulatory Visit (INDEPENDENT_AMBULATORY_CARE_PROVIDER_SITE_OTHER): Payer: 59 | Admitting: Pharmacist

## 2020-11-28 ENCOUNTER — Other Ambulatory Visit: Payer: Self-pay

## 2020-11-28 DIAGNOSIS — I10 Essential (primary) hypertension: Secondary | ICD-10-CM

## 2020-11-28 DIAGNOSIS — E785 Hyperlipidemia, unspecified: Secondary | ICD-10-CM

## 2020-11-28 DIAGNOSIS — E118 Type 2 diabetes mellitus with unspecified complications: Secondary | ICD-10-CM | POA: Diagnosis not present

## 2020-11-28 DIAGNOSIS — I4891 Unspecified atrial fibrillation: Secondary | ICD-10-CM

## 2020-11-28 DIAGNOSIS — N1832 Chronic kidney disease, stage 3b: Secondary | ICD-10-CM

## 2020-11-28 NOTE — Progress Notes (Signed)
Chronic Care Management Pharmacy Note  11/28/2020 Name:  Dale Anderson MRN:  [id number] DOB:  1929-12-11  Summary: -Pt did start Ozempic and took a few doses; however he reports a nurse told him to continue only insulin. Reason for this is unclear. Pt does report Ozempic was more expensive than other meds. -Pt had low BG 66 this morning after skipping meal last night. He treated with large serving of pie and BG rebounded to 248.  -Of note pt is recovering from acute URI/cold  Recommendations/Changes made from today's visit: -Counseled extensively on preventing and treating hypoglycemia (do not skip meals, rule of 15's) -Plan to restart Ozempic once URI resolves. It is likely pt will be able to convert to GLP-1 only regimen without insulin   Subjective: Dale Anderson is an 87 y.o. year old male who is a primary patient of [name], MD.  The CCM team was consulted for assistance with disease management and care coordination needs.    Engaged with patient by telephone for follow up visit in response to provider referral for pharmacy case management and/or care coordination services.   Consent to Services:  The patient was given information about Chronic Care Management services, agreed to services, and gave verbal consent prior to initiation of services.  Please see initial visit note for detailed documentation.   Patient Care Team: [name], MD as PCP - General (Internal Medicine) Belva Crome, MD as PCP - Cardiology (Cardiology) Alda Berthold, DO as Consulting Physician (Neurology) Charlton Haws, [facility] as Pharmacist (Pharmacist) Knox Royalty, RN as [place] Management  Recent office visits: 10/17/20 Dr Ronnald Ramp OV: DM f/u; A1c 7.9; no med changes. F/U 6 months  07/17/20 Dr Ronnald Ramp OV: chronic f/u, c/o lightheadedness. DC'd ARB d/t K 5.3. A1c up to 8.4, increased Tresiba to 30 units. Rx'd Gvoke. CT scan for recent fall negative for  bleed or acute stroke.  Recent consult visits: 05/11/20 Dr Carolin Sicks (nephrology): pt recently started Eliquis, metoprolol for Afib. No med changes.  Hospital visits: None in previous 6 months  Objective:  Lab Results  Component Value Date   CREATININE 1.58 (H) 10/17/2020   BUN 32 (H) 10/17/2020   GFR 38.83 (L) 10/17/2020   GFRNONAA 56 (L) 05/19/2012   GFRAA 65 (L) 05/19/2012   NA 137 10/17/2020   K 4.5 10/17/2020   CALCIUM 9.1 10/17/2020   CO2 26 10/17/2020    Lab Results  Component Value Date/Time   HGBA1C 7.9 (H) 10/17/2020 04:00 PM   HGBA1C 8.4 (H) 07/17/2020 02:23 PM   HGBA1C 7.1 01/25/2016 12:00 AM   GFR 38.83 (L) 10/17/2020 04:00 PM   GFR 34.65 (L) 07/17/2020 02:23 PM   MICROALBUR 10.0 (H) 10/17/2020 04:00 PM   MICROALBUR 12.5 (H) 09/22/2019 10:34 AM    Last diabetic Eye exam:  Lab Results  Component Value Date/Time   HMDIABEYEEXA No Retinopathy 12/10/2018 12:00 AM    Last diabetic Foot exam:  Lab Results  Component Value Date/Time   HMDIABFOOTEX done 08/29/2013 12:00 AM     Lab Results  Component Value Date   CHOL 133 01/04/2020   HDL 44 01/04/2020   LDLCALC 72 01/04/2020   TRIG 85 01/04/2020   CHOLHDL 3.0 01/04/2020    Hepatic Function Latest Ref Rng & Units 07/17/2020 12/22/2018 05/24/2018  Total Protein 6.0 - 8.3 g/dL 7.8 7.5 7.5  Albumin 3.5 - 5.2 g/dL 4.4 4.3 4.2  AST 0 - 37 U/L  _0 ALT 0 - 53 U/L _1 Alk Phosphatase 39 - 117 U/L 71 65 62  Total Bilirubin 0.2 - 1.2 mg/dL 1.1 1.0 0.7  Bilirubin, Direct 0.0 - 0.3 mg/dL 0.2 - -    Lab Results  Component Value Date/Time   TSH 2.32 07/17/2020 02:23 PM   TSH 4.28 01/04/2020 09:33 AM    CBC Latest Ref Rng & Units 10/17/2020 07/17/2020 01/12/2020  WBC 4.0 - 10.5 K/uL 5.9 6.6 6.7  Hemoglobin 13.0 - 17.0 g/dL 12.0(L) 13.2 11.9(L)  Hematocrit 39.0 - 52.0 % 35.4(L) 39.6 34.9(L)  Platelets 150.0 - 400.0 K/uL 170.0 209.0 202    No results found for: VD25OH  Clinical ASCVD: No  The  ASCVD Risk score Mikey Bussing DC Jr., et al., 2013) failed to calculate for the following reasons:   The 2013 ASCVD risk score is only valid for ages 65 to 76    Depression screen PHQ 2/9 10/17/2020 08/13/2020 07/17/2020  Decreased Interest 0 0 0  Down, Depressed, Hopeless 0 0 0  PHQ - 2 Score 0 0 0    GAD 7 : Generalized Anxiety Score 05/25/2018 11/24/2016  Nervous, Anxious, on Edge 2 0  Control/stop worrying 0 0  Worry too much - different things 1 0  Trouble relaxing 1 0  Restless 1 0  Easily annoyed or irritable 0 0  Afraid - awful might happen 0 0  Total GAD 7 Score 5 0   CHA2DS2-VASc Score = 4  The patient's score is based upon: CHF History: No HTN History: Yes Diabetes History: Yes Stroke History: No Vascular Disease History: No Age Score: 2 Gender Score: 0     Social History   Tobacco Use  Smoking Status Never Smoker  Smokeless Tobacco Never Used   BP Readings from Last 3 Encounters:  10/17/20 (!) 148/72  07/17/20 (!) 156/88  03/27/20 (!) 144/82   Pulse Readings from Last 3 Encounters:  10/17/20 (!) 56  07/17/20 87  03/27/20 63   Wt Readings from Last 3 Encounters:  10/17/20 167 lb 9.6 oz (76 kg)  07/17/20 166 lb (75.3 kg)  03/27/20 168 lb (76.2 kg)   BMI Readings from Last 3 Encounters:  10/17/20 25.30 kg/m  07/17/20 23.15 kg/m  03/27/20 23.43 kg/m     Assessment/Interventions: Review of patient past medical history, allergies, medications, health status, including review of consultants reports, laboratory and other test data, was performed as part of comprehensive evaluation and provision of chronic care management services.   SDOH:  (Social Determinants of Health) assessments and interventions performed: Yes   CCM Care Plan  Allergies  Allergen Reactions  . Lisinopril Cough    Medications Reviewed Today    Reviewed by Charlton Haws, [facility] (Pharmacist) on 11/28/20 at [place] List Status: <None>  Medication Order Taking? Sig Documenting  Provider Last Dose Status Informant  ALPRAZolam (XANAX) 0.5 MG tablet [id number] Yes TAKE 1 TABLET BY MOUTH DAILY AS NEEDED FOR ANXIETY [name], MD Taking Active   apixaban (ELIQUIS) 2.5 MG TABS tablet [id number] Yes Take 1 tablet (2.5 mg total) by mouth 2 (two) times daily. [name], MD Taking Active   atorvastatin (LIPITOR) 20 MG tablet [id number] Yes TAKE 1 TABLET BY MOUTH DAILY. [name], MD Taking Active   benzonatate (TESSALON PERLES) 100 MG capsule [id number] Yes Take 1 capsule (100 mg total) by mouth 3 (three) times daily as needed. [name], DO Taking Active  blood glucose meter kit and supplies KIT [id number] Yes Use to test blood sugar once daily. DX: E11.9 [name], MD Taking Active   Blood Glucose Monitoring Suppl ([address] [place]) DEVI [id number] Yes  [provider] Taking Active   Cholecalciferol (VITAMIN D) 1000 UNITS capsule [id number] Yes Take 1,000 Units by mouth daily. [provider] Taking Active            Med Note Thurmond Butts, Johny Drilling   Fri Jan 16, 2017 10:06 AM)    Continuous Blood Gluc Receiver (FREESTYLE LIBRE 2 READER) DEVI [id number] Yes USE AS DIRECTED [name], MD Taking Active   Continuous Blood Gluc Sensor (FREESTYLE LIBRE 2 SENSOR) [place] [id number] Yes USE AS DIRECTED [name], MD Taking Active   glucose blood (FREESTYLE LITE) test strip [id number] Yes USE TO CHECK BLOOD SUGAR ONCE DAILY [name], MD Taking Active   Insulin Glargine-yfgn (SEMGLEE, YFGN,) 100 UNIT/ML SOPN [id number] Yes Inject 15 Units into the skin daily. [name], MD Taking Active   Insulin Pen Needle 31G X 6 MM MISC [id number] Yes USE TO INJECT INSULIN 5 TIMES DAILY [name], MD Taking Active   L-Methylfolate-Algae-B12-B6 3-90.314-2-35 MG CAPS [id number] Yes TAKE 1 CAPSULE BY MOUTH 2 (TWO) TIMES DAILY. [name], MD Taking Active            Med Note Malena Catholic Aug 15, 2020  2:28 PM) Not covered by  insurance ~75 out of pocket  Lancets MISC [id number] Yes Use to test blood sugar once daily. DX E11.09 [name], MD Taking Active   metoprolol succinate (TOPROL-XL) 25 MG 24 hr tablet [id number] Yes TAKE 1 TABLET (25 MG TOTAL) BY MOUTH DAILY. Belva Crome, MD Taking Active   Multiple Vitamins-Minerals (CENTRUM SILVER PO) [id number] Yes Take by mouth. [provider] Taking Active   pantoprazole (PROTONIX) 40 MG tablet [id number] Yes TAKE 1 TABLET BY MOUTH ONCE DAILY. [name], MD Taking Active   tamsulosin [facility]) 0.4 MG CAPS capsule [id number] Yes TAKE 1 CAPSULE BY MOUTH DAILY. [name], MD Taking Active         Discontinued 07/17/20 1749 (Discontinued by provider)           Patient Active Problem List   Diagnosis Date Noted  . Encounter for general adult medical examination with abnormal findings 07/18/2020  . Atrial fibrillation by electrocardiogram ([place]) 01/12/2020  . Murmur, cardiac 01/04/2020  . Bradycardia 01/04/2020  . Internal hemorrhoid 12/08/2019  . Tinea cruris 12/05/2019  . Seborrheic dermatitis of scalp 05/05/2019  . Diabetic polyneuropathy associated with type 2 diabetes mellitus ([place]) 03/29/2019  . Seasonal allergic rhinitis due to pollen 05/24/2018  . Sciatica 10/21/2017  . Chronic renal disease, stage 3, moderately decreased glomerular filtration rate (GFR) between 30-59 mL/min/1.73 square meter (HCC) 07/02/2017  . Routine general medical examination at a health care facility 12/26/2013  . DDD (degenerative disc disease), cervical 12/26/2013  . Hypertension   . Hyperlipidemia with target LDL less than 100   . Type II diabetes mellitus with manifestations ([place])   . GERD (gastroesophageal reflux disease)   . DJD (degenerative joint disease)   . Anxiety     Immunization History  Administered Date(s) Administered  . Fluad Quad(high Dose 65+) 02/24/2019  . Influenza Split 03/05/2011, 03/23/2012  . Influenza Whole 03/23/2002, 05/16/2009   . Influenza, High Dose Seasonal PF 03/23/2017, 03/27/2018  . Influenza-Unspecified 04/23/2006, 03/23/2008, 04/23/2009, 03/23/2014, 03/23/2016,  03/23/2020  . PFIZER(Purple Top)SARS-COV-2 Vaccination 06/15/2019, 07/06/2019, 10/12/2020  . Pneumococcal Conjugate-13 12/26/2013  . Pneumococcal Polysaccharide-23 03/18/2006, 05/24/2018  . Pneumococcal-Unspecified 07/20/2005  . Tdap 04/08/2012  . Zoster, Live 08/08/2004    Conditions to be addressed/monitored:  Hypertension, Hyperlipidemia, Diabetes, Atrial Fibrillation and Chronic Kidney Disease  Care Plan : CCM Pharmacy Care Plan  Updates made by Charlton Haws, RPH since 11/28/2020 12:00 AM    Problem: Hypertension, Hyperlipidemia, Diabetes, Atrial Fibrillation, Chronic Kidney Disease   Priority: High    Long-Range Goal: Disease management   Start Date: 08/15/2020  Expected End Date: 02/12/2021  This Visit's Progress: Not on track  Recent Progress: On track  Priority: High  Note:   Current Barriers:  . Unable to independently monitor therapeutic efficacy . Unable to achieve control of diabetes  . Suboptimal therapeutic regimen for diabetes  Pharmacist Clinical Goal(s):  Marland Kitchen Patient will achieve adherence to monitoring guidelines and medication adherence to achieve therapeutic efficacy . achieve control of diabetes as evidenced by improvement in blood sugar readings . adhere to plan to optimize therapeutic regimen for diabetes as evidenced by report of adherence to recommended medication management changes through collaboration with PharmD and provider.   Interventions: . 1:1 collaboration with [name], MD regarding development and update of comprehensive plan of care as evidenced by provider attestation and co-signature . Inter-disciplinary care team collaboration (see longitudinal plan of care) . Comprehensive medication review performed; medication list updated in electronic medical record  Hypertension (BP goal  <140/90) -Not ideally controlled - pt is not checking BP at home and it has been above goal in office recently -Current treatment: . Metoprolol succinate 25 mg daily -Medications previously tried: chlorthalidone, lisinopril, telmisartan -Current home readings: not checking -Current exercise habits: walks hospital hallways at work -Denies hypotensive/hypertensive symptoms -Educated on BP goals and benefits of medications for prevention of heart attack, stroke and kidney damage; Importance of home blood pressure monitoring; -Counseled to monitor BP at home daily, document, and provide log at future appointments -Recommended to continue current medication  Hyperlipidemia: (LDL goal < 100) -Controlled -Current treatment: . Atorvastatin 20 mg daily -Educated on Cholesterol goals; Benefits of statin for ASCVD risk reduction; -Recommended to continue current medication  Diabetes (A1c goal <7%) -Improved -A1c at recent PCP appt improved to 7.9%. Pt reports Freestyle Elenor Legato is working well and he is compliant with insulin; he reports BG spikes high after eating; he endorses dietary indiscretions (fast food, cornbread) that raise BG significantly; he recently had low BG 66 in AM after skipping dinner night before, his CGM notified him and he ate a large serving of pie, BG quickly rose to 248 -Pt did start Ozempic 0.25 mg weekly, but he says a nurse told him to stop and continue insulin only. Reason for stopping is unclear, pt reports Ozempic was more expensive than his other meds (insulin is free) -Current medications: . Semglee 15 units daily . Freestyle Libre 2 -Medications previously tried: Barista, metformin  -Current home glucose readings . fasting glucose: 120-150 . Post-prandial glucose: 240-260 -Counseled on dangers of highly variable sugars, consistent elevations in BG; pt will likely benefit from GLP-1 agonist that targets post-prandial BG; it is likely that patient will be able to  convert to GLP-1 only regimen without insulin, which would be safer due to lower hypoglycemia risk; pt is currently recovering from URI/cold so will defer restarting Ozempic another month; can consider PAP for Ozempic if cost is the problem -Counseled on prevention and  treatment of hypoglycemia - advised not to skip meals, rule of 15's -Plan: Continue medications as above. Plan to restart Ozempic when patient recovers from URI  Atrial Fibrillation (Goal: prevent stroke and major bleeding) -Controlled -CHADSVASC: 4 -Current treatment: . Rate control: Metoprolol succinate 25 mg daily . Anticoagulation: Eliquis 2.5 mg BID -Counseled on increased risk of stroke due to Afib and benefits of anticoagulation for stroke prevention; importance of adherence to anticoagulant exactly as prescribed; avoidance of NSAIDs due to increased bleeding risk with anticoagulants; seeking medical attention after a head injury or if there is blood in the urine/stool; -Recommended to continue current medication  Patient Goals/Self-Care Activities . Patient will:  - take medications as prescribed focus on medication adherence by routine check glucose via [facility], document, and provide at future appointments check blood pressure daily, document, and provide at future appointments -Do not skip meals. If sugar is low (<70), treat with 4 oz of orange juice or regular soda, wait 15 min and re-check sugar. If sugar is still not > 70, repeat process until it is.  Follow Up Plan: Telephone follow up appointment with care management team member scheduled for: 1 month       Medication Assistance: None required.  Patient affirms current coverage meets needs.  Compliance/Adherence/Medication fill history: Care Gaps: Shingrix Eye exam (due 12/10/19) Foot exam (due 09/21/20)  Star-Rating Drugs: Atorvastatin - LF 10/31/20 x 90 ds   Patient's preferred pharmacy is:  [facility] [address] [place] [address] Phone: [phone] Fax: [phone]  Uses pill box? No - prefers bottles Pt endorses 100% compliance  We discussed: Current pharmacy is preferred with insurance plan and patient is satisfied with pharmacy services Patient decided to: Continue current medication management strategy  Care Plan and Follow Up Patient Decision:  Patient agrees to Care Plan and Follow-up.  Plan: Telephone follow up appointment with care management team member scheduled for:  1 month  Charlene Brooke, PharmD, [place], CPP Clinical Pharmacist [facility] at [facility] [phone]

## 2020-11-28 NOTE — Patient Instructions (Signed)
Visit Information  Phone number for Pharmacist: [phone]  Goals Addressed            This Visit's Progress   . Manage My Medicine       Timeframe:  Long-Range Goal Priority:  High Start Date:      08/15/20                       Expected End Date:  06/14/21                    Follow Up Date July 2022   - call for medicine refill 2 or 3 days before it runs out - call if I am sick and can't take my medicine - keep a list of all the medicines I take; vitamins and herbals too  -Continue Semglee 15 units daily -Continue monitoring sugars with Freestyle Libre -Do not skip meals. In the event of low sugar (<70), treat with 4 oz of orange juice or regular soda, wait 15 min and re-check sugar. If sugar is still not > 70, repeat process until it is.   Why is this important?   . These steps will help you keep on track with your medicines.   Notes:        Patient verbalizes understanding of instructions provided today and agrees to view in [place].  Telephone follow up appointment with pharmacy team member scheduled for: 1 month  Charlene Brooke, PharmD, [place], CPP Clinical Pharmacist [facility] at [facility] [phone]

## 2020-11-29 ENCOUNTER — Encounter: Payer: Self-pay | Admitting: *Deleted

## 2020-11-29 ENCOUNTER — Telehealth: Payer: 59

## 2020-11-29 ENCOUNTER — Telehealth: Payer: Self-pay | Admitting: *Deleted

## 2020-11-29 NOTE — Telephone Encounter (Signed)
  Chronic Care Management   Follow Up Note   11/29/2020 Name: Dale Anderson MRN: [id number] DOB: 04-26-1930   Referred by: [name], MD Reason for referral : Chronic Care Management (CCM RN CM Telephone Outreach, F/U DM, HTN: Unsuccessful Outreach)  An unsuccessful telephone outreach was attempted today. The patient was referred to the case management team for assistance with care management and care coordination.   Follow Up Plan:  A HIPPA compliant phone message was left for the patient providing contact information and requesting a return call Will make scheduling care guide aware of today's third missed (previously scheduled) appointment with CCM RN CM and request call to re-schedule with patient if I do not hear back from patient by end of day today.  Of note, patient remains active/ engaged with CCM Pharmacy team  [name], RN, BSN, [place] [phone]: direct office [phone]: mobile

## 2020-11-30 ENCOUNTER — Ambulatory Visit: Payer: 59 | Admitting: *Deleted

## 2020-11-30 DIAGNOSIS — I4891 Unspecified atrial fibrillation: Secondary | ICD-10-CM | POA: Diagnosis not present

## 2020-11-30 DIAGNOSIS — E785 Hyperlipidemia, unspecified: Secondary | ICD-10-CM | POA: Diagnosis not present

## 2020-11-30 DIAGNOSIS — I10 Essential (primary) hypertension: Secondary | ICD-10-CM | POA: Diagnosis not present

## 2020-11-30 DIAGNOSIS — E118 Type 2 diabetes mellitus with unspecified complications: Secondary | ICD-10-CM | POA: Diagnosis not present

## 2020-11-30 NOTE — Chronic Care Management (AMB) (Signed)
Chronic Care Management   CCM RN Visit Note  11/30/2020 Name: Dale Anderson MRN: [id number] DOB: 02/16/1930  Subjective: Dale Anderson is a 87 y.o. year old male who is a primary care patient of [name], MD. The care management team was consulted for assistance with disease management and care coordination needs.    Engaged with patient by telephone for follow up visit in response to provider referral for case management and/or care coordination services. Patient returned my missed call from yesterday's previously scheduled appointment; appointment completed accordingly today.  Consent to Services:  The patient was given information about Chronic Care Management services, agreed to services, and gave verbal consent prior to initiation of services.  Please see initial visit note for detailed documentation.  Patient agreed to services and verbal consent obtained.   Assessment: Review of patient past medical history, allergies, medications, health status, including review of consultants reports, laboratory and other test data, was performed as part of comprehensive evaluation and provision of chronic care management services.   CCM Care Plan  Allergies  Allergen Reactions   Lisinopril Cough    Outpatient Encounter Medications as of 11/30/2020  Medication Sig Note   ALPRAZolam (XANAX) 0.5 MG tablet TAKE 1 TABLET BY MOUTH DAILY AS NEEDED FOR ANXIETY    apixaban (ELIQUIS) 2.5 MG TABS tablet Take 1 tablet (2.5 mg total) by mouth 2 (two) times daily.    atorvastatin (LIPITOR) 20 MG tablet TAKE 1 TABLET BY MOUTH DAILY.    benzonatate (TESSALON PERLES) 100 MG capsule Take 1 capsule (100 mg total) by mouth 3 (three) times daily as needed.    blood glucose meter kit and supplies KIT Use to test blood sugar once daily. DX: E11.9    Blood Glucose Monitoring Suppl (FREESTYLE LITE) DEVI     Cholecalciferol (VITAMIN D) 1000 UNITS capsule Take 1,000 Units by mouth daily.     Continuous Blood Gluc Receiver (FREESTYLE LIBRE 2 READER) DEVI USE AS DIRECTED    Continuous Blood Gluc Sensor (FREESTYLE LIBRE 2 SENSOR) MISC USE AS DIRECTED    glucose blood (FREESTYLE LITE) test strip USE TO CHECK BLOOD SUGAR ONCE DAILY    Insulin Glargine-yfgn (SEMGLEE, YFGN,) 100 UNIT/ML SOPN Inject 15 Units into the skin daily.    Insulin Pen Needle 31G X 6 MM MISC USE TO INJECT INSULIN 5 TIMES DAILY    L-Methylfolate-Algae-B12-B6 3-90.314-2-35 MG CAPS TAKE 1 CAPSULE BY MOUTH 2 (TWO) TIMES DAILY. 08/15/2020: Not covered by insurance ~75 out of pocket   Lancets MISC Use to test blood sugar once daily. DX E11.09    metoprolol succinate (TOPROL-XL) 25 MG 24 hr tablet TAKE 1 TABLET (25 MG TOTAL) BY MOUTH DAILY.    Multiple Vitamins-Minerals (CENTRUM SILVER PO) Take by mouth.    pantoprazole (PROTONIX) 40 MG tablet TAKE 1 TABLET BY MOUTH ONCE DAILY.    tamsulosin (FLOMAX) 0.4 MG CAPS capsule TAKE 1 CAPSULE BY MOUTH DAILY.    [DISCONTINUED] telmisartan (MICARDIS) 40 MG tablet TAKE 1 TABLET BY MOUTH DAILY.    No facility-administered encounter medications on file as of 11/30/2020.    Patient Active Problem List   Diagnosis Date Noted   Encounter for general adult medical examination with abnormal findings 07/18/2020   Atrial fibrillation by electrocardiogram ([place]) 01/12/2020   Murmur, cardiac 01/04/2020   Bradycardia 01/04/2020   Internal hemorrhoid 12/08/2019   Tinea cruris 12/05/2019   Seborrheic dermatitis of scalp 05/05/2019   Diabetic polyneuropathy associated with type 2 diabetes mellitus (  [place]) 03/29/2019   Seasonal allergic rhinitis due to pollen 05/24/2018   Sciatica 10/21/2017   Chronic renal disease, stage 3, moderately decreased glomerular filtration rate (GFR) between 30-59 mL/min/1.73 square meter ([place]) 07/02/2017   Routine general medical examination at a health care facility 12/26/2013   DDD (degenerative disc disease), cervical 12/26/2013   Hypertension    Hyperlipidemia  with target LDL less than 100    Type II diabetes mellitus with manifestations (HCC)    GERD (gastroesophageal reflux disease)    DJD (degenerative joint disease)    Anxiety     Conditions to be addressed/monitored: HTN and DMII  Care Plan : Diabetes Type 2 (Adult)  Updates made by Knox Royalty, RN since 11/30/2020 12:00 AM     Problem: Glycemic Management (Diabetes, Type 2)   Priority: High     Long-Range Goal: Glycemic Management Optimized   Start Date: 11/30/2020  Expected End Date: 11/30/2021  This Visit's Progress: On track  Recent Progress: On track  Priority: High  Note:   Objective:  Lab Results  Component Value Date   HGBA1C 8.4 (H) 07/17/2020   Lab Results  Component Value Date   CREATININE 1.74 (H) 07/17/2020   CREATININE 1.56 (H) 01/04/2020   CREATININE 1.51 (H) 09/22/2019   No results found for: EGFR Current Barriers:  Knowledge Deficits related to basic Diabetes pathophysiology and self care/management: lacks understanding of significance of monitoring and reaching A1-C target; needs ongoing reeducation/ reinforcement of carb modified diet Knowledge Deficits related to medications used for management of diabetes: lacks understanding of difference between long and short acting insulins Knowledge Deficits related to self administration of injectable diabetes medications- recently discovered he was not using long acting insulin correctly and needs reinforcement and follow up with pharmacist; patient had not been removing cap of long-acting insulin and was not receiving prescribed dose, he has since been unable to re-fill this medication through his outpatient pharmacy Case Manager Clinical Goal(s):  Goal re-established and extended 11/30/20 Over the next 12 months, patient will demonstrate improved adherence to prescribed treatment plan for diabetes self care/management as evidenced by:  daily monitoring and recording of CBG 3-4 times per day  adherence to ADA/  carb modified diet  adherence to prescribed medication regimen contacting provider for new or worsened symptoms or questions Interventions:  Collaboration with [name], MD regarding development and update of comprehensive plan of care as evidenced by provider attestation and co-signature Inter-disciplinary care team collaboration (see longitudinal plan of care); direct collaboration with Charlene Brooke, Pharm D regarding medication management concerns Reviewed medications with patient post- recent CCM Pharmacist visit yesterday and confirmed that he has good understanding of instructions- confirmed patient understands to re-start Ozempic once symptoms from URI resolves- he reports today that he is feeling better than yesterday; discussed importance of medication adherence, confirmed patient has contact information for CCM Pharmacist and encouraged him to maintain contact with CCM Pharmacist for any medication questions/ issues Confirmed patient has been monitoring/ recording blood sugars 3-4 times per day using FSL/ CGM: confirmed that patient is comfortable in using [place] with patient recent blood sugars at home; he reports fasting blood sugar today of 132; reports post- prandial values consistently between 180-200: discussed recently reported isolated fasting blood sugar of 66-- patient reports that he did not have associated symptoms with this isolated event; reports he ate something right away and blood sugar returned to normal: initiated education around signs/ symptoms hypoglycemia along with corresponding action plan  for same Provided positive reinforcement for patient's report today that he is trying to establish a better more consistent routine for eating meals- encouraged him to continue this effort and explained that consistent blood sugar levels are optimal in setting of DM Confirmed patient has not yet scheduled annual eye/ vision provider appointment: encouraged him to do so  and explained rationale for prevention of complications from high blood sugar Confirmed patient continues working full time, denies clinical concerns today Reviewed upcoming provider appointments with patient Discussed plans with patient for ongoing care management follow up and confirmed patient has direct contact information for care management team Patient Goals/Self-Care Activities UNABLE to independently verbalize A1-C goal and needs reinforcement around use of and purpose of medications for DM Self administers medications as prescribed Self administers insulin as prescribed Great job monitoring your blood sugars at home: keep up the great work!  The blood sugars we reviewed today are in good range Continue checking blood sugars at home 3-4 times per day; write down all blood sugars and when you are taking your blood sugars: please write down what the "fasting (before eating/ morning)" blood sugars are and what the "2 hour after-eating" blood sugars are: these values help your doctor dose your insulin to make sure it is not too much or too little check blood sugar if I feel it is too high or too low take the blood sugar log to all doctor visits--- call your care providers if you have questions or concerns As we discussed today, try to develop a more regular routine for your eating/ meal times Please make an appointment for your annual eye/ vision evaluation If you have questions about what medications you should be taking, be  let Pharmacist Charlene Brooke at Dr. Ronnald Ramp office  Follow Up Plan:  Telephone follow up appointment with care management team member scheduled for: Monday December 31, 2020 at 1:00 pm The patient has been provided with contact information for the care management team and has been advised to call with any health related questions or concerns.      Care Plan : Hypertension (Adult)  Updates made by Knox Royalty, RN since 11/30/2020 12:00 AM     Problem: Hypertension  (Hypertension)   Priority: Medium     Long-Range Goal: Hypertension Monitored   Start Date: 11/30/2020  Expected End Date: 11/30/2021  This Visit's Progress: On track  Recent Progress: On track  Priority: Medium  Note:   Objective:  Last practice recorded BP readings:  BP Readings from Last 3 Encounters:  07/17/20 (!) 156/88  03/27/20 (!) 144/82  03/19/20 (!) 144/66  Most recent eGFR/CrCl: No results found for: EGFR  No components found for: CRCL Current Barriers:  Knowledge Deficits related to basic understanding of hypertension pathophysiology and self care management; patient adhering to medication and prescribed diet for HTN; could benefit from ongoing reinforcement of strategies for blood pressure management Unable to independently verbalize blood pressures at home and blood pressure target ranges Case Manager Clinical Goal(s):  Goal re-established/ extended 11/30/20: Over the next 12 months, patient will demonstrate improved adherence to prescribed treatment plan for hypertension as evidenced by patient reporting during CCM RN CM outreach of: taking all medications as prescribed monitoring and recording blood pressure 2- 3 times per week adhering to low sodium/DASH diet Interventions:  Collaboration with [name], MD regarding development and update of comprehensive plan of care as evidenced by provider attestation and co-signature Inter-disciplinary care team collaboration (see longitudinal plan  of care) Discussed current clinical condition with patient: reports URI "better today;" denies acute clinical concerns Confirms patient has been occasionally monitoring blood pressure, but not recording: no specific recorded values to review today; provided positive reinforcement to patient for monitoring blood pressures, discussed advantage of recording at home and encouraged patient to do so; today, patient reports blood pressures he has taken "all are fine." Advised patient,  providing education and rationale, to monitor blood pressure 2-3 times per week and record, calling care providers for questions or concerns Reinforced previously provided education to patient re: DASH diet, complications of uncontrolled blood pressure; how diabetes affects blood pressure: patient continues to require ongoing reinforcement Confirmed no recent changes to BP medications, patient continues taking as prescribed Reviewed with patient signs/ symptoms MI and CVA: along with corresponding action plan for same Patient Goals/Self-Care Activities Self administers medications as prescribed Attends all scheduled provider appointments Calls provider office for new concerns, questions, or BP outside discussed parameters Follows a low sodium diet/DASH diet Continue checking blood pressure 2- 3 times per week Write blood pressure results in a log or diary: we will review these next time we talk  Take your recorded blood pressure readings at home to your doctor appointments: these values will help the doctor make sure you are on the right medication Continue eating a low-salt, heart healthy diet- this helps manage your diabetes and your blood pressure Follow Up Plan:  Telephone follow up appointment with care management team member scheduled for: December 31, 2020 at 1:00 pm The patient has been provided with contact information for the care management team and has been advised to call with any health related questions or concerns.      Plan: Telephone follow up appointment with care management team member scheduled for:  Monday, December 31, 2020 at 1:00 pm The patient has been provided with contact information for the care management team and has been advised to call with any health related questions or concerns.   [name], RN, BSN, [facility] RN Care Coordination- [place] [phone]: direct office [phone]: mobile

## 2020-11-30 NOTE — Patient Instructions (Signed)
Visit Information  [name], it was nice talking with you today   Please read over the attached information, and start now to write down any of the blood pressures you take before our next telephone appointment.   I look forward to talking to you again for an update on Monday December 31, 2020 at 1:00 pm- please be listening out for my call that day.  I will call as close to 1:00 pm as possible; I look forward to hearing about your progress.   Please don't hesitate to contact me if I can be of assistance to you before our next scheduled appointment.   [name], RN, BSN, [facility] RN Care Coordination- [place] [phone]: direct office [phone]: mobile    PATIENT GOALS:  Goals Addressed             This Visit's Progress    Monitor and Manage My Blood Sugar-Diabetes Type 2   On track    Timeframe:  Long-Range Goal Priority:  Medium Start Date:     11/30/20                        Expected End Date:   11/30/21                    Follow Up Date 12/31/20 at 1:00 pm  Great job monitoring your blood sugars at home: keep up the great work!  The blood sugars we reviewed today are in good range Continue checking blood sugars at home 3-4 times per day; write down all blood sugars and when you are taking your blood sugars: please write down what the "fasting (before eating/ morning)" blood sugars are and what the "2 hour after-eating" blood sugars are: these values help your doctor dose your insulin to make sure it is not too much or too little check blood sugar if I feel it is too high or too low take the blood sugar log to all doctor visits--- call your care providers if you have questions or concerns As we discussed today, try to develop a more regular routine for your eating/ meal times Please make an appointment for your annual eye/ vision evaluation If you have questions about what medications you should be taking, be  let Pharmacist Charlene Brooke at Dr. Ronnald Ramp office     Why is this important?   Checking your blood sugar at home helps to keep it from getting very high or very low.  Writing the results in a diary or log helps the doctor know how to care for you.  Your blood sugar log should have the time, date and the results.  Also, write down the amount of insulin or other medicine that you take.  Other information, like what you ate, exercise done and how you were feeling, will also be helpful.           Track and Manage My Blood Pressure-Hypertension   On track    Timeframe:  Long-Range Goal Priority:  Medium Start Date:       11/30/20                      Expected End Date:    11/30/21                   Follow Up Date 12/31/20   Continue checking blood pressure 2- 3 times per week  Write blood pressure results in a log or diary: we will review these next time we talk  Take your recorded blood pressure readings at home to your doctor appointments: these values will help the doctor make sure you are on the right medication Continue eating a low-salt, heart healthy diet- this helps manage your diabetes and your blood pressure   Why is this important?   You won't feel high blood pressure, but it can still hurt your blood vessels.  High blood pressure can cause heart or kidney problems. It can also cause a stroke.  Making lifestyle changes like losing a little weight or eating less salt will help.  Checking your blood pressure at home and at different times of the day can help to control blood pressure.  If the doctor prescribes medicine remember to take it the way the doctor ordered.  Call the office if you cannot afford the medicine or if there are questions about it.             Hypoglycemia Hypoglycemia is when the sugar (glucose) level in your blood is too low. Low blood sugar can happen to people who have diabetes and people who do not have diabetes. Low blood sugar can happenquickly, and it can be an  emergency. What are the causes? This condition happens most often in people who have diabetes. It may be caused by: Diabetes medicine. Not eating enough, or not eating often enough. Doing more physical activity. Drinking alcohol on an empty stomach. If you do not have diabetes, this condition may be caused by: A tumor in the pancreas. Not eating enough, or not eating for long periods at a time (fasting). A very bad infection or illness. Problems after having weight loss (bariatric) surgery. Kidney failure or liver failure. Certain medicines. What increases the risk? This condition is more likely to develop in people who: Have diabetes and take medicines to lower their blood sugar. Abuse alcohol. Have a very bad illness. What are the signs or symptoms? Mild Hunger. Sweating and feeling clammy. Feeling dizzy or light-headed. Being sleepy or having trouble sleeping. Feeling like you may vomit (nauseous). A fast heartbeat. A headache. Blurry vision. Mood changes, such as: Being grouchy. Feeling worried or nervous (anxious). Tingling or loss of feeling (numbness) around your mouth, lips, or tongue. Moderate Confusion and poor judgment. Behavior changes. Weakness. Uneven heartbeat. Trouble with moving (coordination). Very low Very low blood sugar (severe hypoglycemia) is a medical emergency. It can cause: Fainting. Seizures. Loss of consciousness (coma). Death. How is this treated? Treating low blood sugar Low blood sugar is often treated by eating or drinking something that has sugar in it right away. The food or drink should contain 15 grams of a fast-acting carb (carbohydrate). Options include: 4 oz (120 mL) of fruit juice. 4 oz (120 mL) of regular soda (not diet soda). A few pieces of hard candy. Check food labels to see how many pieces to eat for 15 grams. 1 Tbsp (15 mL) of sugar or honey. 4 glucose tablets. 1 tube of glucose gel. Treating low blood sugar if you  have diabetes If you can think clearly and swallow safely, follow the 15:15 rule: Take 15 grams of a fast-acting carb. Talk with your doctor about how much you should take. Always keep a source of fast-acting carb with you, such as: Glucose tablets (take 4 tablets). A few pieces of hard candy. Check food labels to see how many pieces to eat for 15 grams.  4 oz (120 mL) of fruit juice. 4 oz (120 mL) of regular soda (not diet soda). 1 Tbsp (15 mL) of honey or sugar. 1 tube of glucose gel. Check your blood sugar 15 minutes after you take the carb. If your blood sugar is still at or below 70 mg/dL (3.9 mmol/L), take 15 grams of a carb again. If your blood sugar does not go above 70 mg/dL (3.9 mmol/L) after 3 tries, get help right away. After your blood sugar goes back to normal, eat a meal or a snack within 1 hour.  Treating very low blood sugar If your blood sugar is below 54 mg/dL (3 mmol/L), you have very low blood sugar, or severe hypoglycemia. This is an emergency. Get medical help right away. If you have very low blood sugar and you cannot eat or drink, you will need to be given a hormone called glucagon. A family member or friend should learn how to check your blood sugar and how to give you glucagon. Ask your doctor if youneed to have an emergency glucagon kit at home. Very low blood sugar may also need to be treated in a hospital. Follow these instructions at home: General instructions Take over-the-counter and prescription medicines only as told by your doctor. Stay aware of your blood sugar as told by your doctor. If you drink alcohol: Limit how much you have to: 0-1 drink a day for women who are not pregnant. 0-2 drinks a day for men. Know how much alcohol is in your drink. In the [place] one drink equals one 12 oz bottle of beer (355 mL), one 5 oz glass of wine (148 mL), or one 1 oz glass of hard liquor (44 mL). Be sure to eat food when you drink alcohol. Know that your body  absorbs alcohol quickly. This may lead to low blood sugar later. Be sure to keep checking your blood sugar. Keep all follow-up visits. If you have diabetes:  Always have a fast-acting carb (15 grams) with you to treat low blood sugar. Follow your diabetes care plan as told by your doctor. Make sure you: Know the symptoms of low blood sugar. Check your blood sugar as often as told. Always check it before and after exercise. Always check your blood sugar before you drive. Take your medicines as told. Follow your meal plan. Eat on time. Do not skip meals. Share your diabetes care plan with: Your work or school. People you live with. Carry a card or wear jewelry that says you have diabetes.  Where to find more information American Diabetes Association: www.diabetes.org Contact a doctor if: You have trouble keeping your blood sugar in your target range. You have low blood sugar often. Get help right away if: You still have symptoms after you eat or drink something that contains 15 grams of fast-acting carb, and you cannot get your blood sugar above 70 mg/dL by following the 15:15 rule. Your blood sugar is below 54 mg/dL (3 mmol/L). You have a seizure. You faint. These symptoms may be an emergency. Get help right away. Call your local emergency services (911 in the [place] Do not wait to see if the symptoms will go away. Do not drive yourself to the hospital. Summary Hypoglycemia happens when the level of sugar (glucose) in your blood is too low. Low blood sugar can happen to people who have diabetes and people who do not have diabetes. Low blood sugar can happen quickly, and it can be an emergency. Make sure  you know the symptoms of low blood sugar and know how to treat it. Always keep a source of sugar (fast-acting carb) with you to treat low blood sugar. This information is not intended to replace advice given to you by your health care provider. Make sure you discuss any questions you  have with your healthcare provider. Document Revised: 05/10/2020 Document Reviewed: 05/10/2020 Elsevier Patient Education  2022 [employer].   Patient verbalizes understanding of instructions provided today and agrees to view in [place].  Telephone follow up appointment with care management team member scheduled for: Monday December 31, 2020 at 1:00 pm The patient has been provided with contact information for the care management team and has been advised to call with any health related questions or concerns.   [name], RN, BSN, [facility] RN Care Coordination- [place] [phone]: direct office [phone]: mobile

## 2020-12-10 ENCOUNTER — Other Ambulatory Visit (HOSPITAL_COMMUNITY): Payer: Self-pay

## 2020-12-10 MED FILL — Continuous Glucose System Sensor: 28 days supply | Qty: 2 | Fill #2 | Status: CN

## 2020-12-12 ENCOUNTER — Other Ambulatory Visit (HOSPITAL_COMMUNITY): Payer: Self-pay

## 2020-12-12 MED FILL — Continuous Glucose System Sensor: 28 days supply | Qty: 2 | Fill #2 | Status: AC

## 2020-12-19 ENCOUNTER — Other Ambulatory Visit (HOSPITAL_COMMUNITY): Payer: Self-pay

## 2020-12-19 MED FILL — Insulin Pen Needle 31 G X 6 MM (1/4" or 15/64"): 20 days supply | Qty: 100 | Fill #0 | Status: AC

## 2020-12-26 ENCOUNTER — Telehealth: Payer: Self-pay | Admitting: Internal Medicine

## 2020-12-26 ENCOUNTER — Telehealth: Payer: Self-pay | Admitting: Pharmacist

## 2020-12-26 ENCOUNTER — Telehealth: Payer: 59

## 2020-12-26 NOTE — Progress Notes (Deleted)
Chronic Care Management Pharmacy Note  12/26/2020 Name:  Dale Anderson MRN:  [id number] DOB:  1929/09/17  Summary: -Pt did start Ozempic and took a few doses; however he reports a nurse told him to continue only insulin. Reason for this is unclear. Pt does report Ozempic was more expensive than other meds. -Pt had low BG 66 this morning after skipping meal last night. He treated with large serving of pie and BG rebounded to 248.  -Of note pt is recovering from acute URI/cold  Recommendations/Changes made from today's visit: -Counseled extensively on preventing and treating hypoglycemia (do not skip meals, rule of 15's) -Plan to restart Ozempic once URI resolves. It is likely pt will be able to convert to GLP-1 only regimen without insulin   Subjective: Dale Anderson is an 87 y.o. year old male who is a primary patient of [name], MD.  The CCM team was consulted for assistance with disease management and care coordination needs.    Engaged with patient by telephone for follow up visit in response to provider referral for pharmacy case management and/or care coordination services.   Consent to Services:  The patient was given information about Chronic Care Management services, agreed to services, and gave verbal consent prior to initiation of services.  Please see initial visit note for detailed documentation.   Patient Care Team: [name], MD as PCP - General (Internal Medicine) Belva Crome, MD as PCP - Cardiology (Cardiology) Alda Berthold, DO as Consulting Physician (Neurology) Charlton Haws, [facility] as Pharmacist (Pharmacist) Knox Royalty, RN as [place] Management  Recent office visits: 10/17/20 Dr Ronnald Ramp OV: DM f/u; A1c 7.9; no med changes. F/U 6 months  07/17/20 Dr Ronnald Ramp OV: chronic f/u, c/o lightheadedness. DC'd ARB d/t K 5.3. A1c up to 8.4, increased Tresiba to 30 units. Rx'd Gvoke. CT scan for recent fall negative for  bleed or acute stroke.  Recent consult visits: 05/11/20 Dr Carolin Sicks (nephrology): pt recently started Eliquis, metoprolol for Afib. No med changes.  Hospital visits: None in previous 6 months  Objective:  Lab Results  Component Value Date   CREATININE 1.58 (H) 10/17/2020   BUN 32 (H) 10/17/2020   GFR 38.83 (L) 10/17/2020   GFRNONAA 56 (L) 05/19/2012   GFRAA 65 (L) 05/19/2012   NA 137 10/17/2020   K 4.5 10/17/2020   CALCIUM 9.1 10/17/2020   CO2 26 10/17/2020    Lab Results  Component Value Date/Time   HGBA1C 7.9 (H) 10/17/2020 04:00 PM   HGBA1C 8.4 (H) 07/17/2020 02:23 PM   HGBA1C 7.1 01/25/2016 12:00 AM   GFR 38.83 (L) 10/17/2020 04:00 PM   GFR 34.65 (L) 07/17/2020 02:23 PM   MICROALBUR 10.0 (H) 10/17/2020 04:00 PM   MICROALBUR 12.5 (H) 09/22/2019 10:34 AM    Last diabetic Eye exam:  Lab Results  Component Value Date/Time   HMDIABEYEEXA No Retinopathy 12/10/2018 12:00 AM    Last diabetic Foot exam:  Lab Results  Component Value Date/Time   HMDIABFOOTEX done 08/29/2013 12:00 AM     Lab Results  Component Value Date   CHOL 133 01/04/2020   HDL 44 01/04/2020   LDLCALC 72 01/04/2020   TRIG 85 01/04/2020   CHOLHDL 3.0 01/04/2020    Hepatic Function Latest Ref Rng & Units 07/17/2020 12/22/2018 05/24/2018  Total Protein 6.0 - 8.3 g/dL 7.8 7.5 7.5  Albumin 3.5 - 5.2 g/dL 4.4 4.3 4.2  AST 0 - 37 U/L  _0 ALT 0 - 53 U/L _1 Alk Phosphatase 39 - 117 U/L 71 65 62  Total Bilirubin 0.2 - 1.2 mg/dL 1.1 1.0 0.7  Bilirubin, Direct 0.0 - 0.3 mg/dL 0.2 - -    Lab Results  Component Value Date/Time   TSH 2.32 07/17/2020 02:23 PM   TSH 4.28 01/04/2020 09:33 AM    CBC Latest Ref Rng & Units 10/17/2020 07/17/2020 01/12/2020  WBC 4.0 - 10.5 K/uL 5.9 6.6 6.7  Hemoglobin 13.0 - 17.0 g/dL 12.0(L) 13.2 11.9(L)  Hematocrit 39.0 - 52.0 % 35.4(L) 39.6 34.9(L)  Platelets 150.0 - 400.0 K/uL 170.0 209.0 202    No results found for: VD25OH  Clinical ASCVD: No  The  ASCVD Risk score Mikey Bussing DC Jr., et al., 2013) failed to calculate for the following reasons:   The 2013 ASCVD risk score is only valid for ages 70 to 68    Depression screen PHQ 2/9 10/17/2020 08/13/2020 07/17/2020  Decreased Interest 0 0 0  Down, Depressed, Hopeless 0 0 0  PHQ - 2 Score 0 0 0    GAD 7 : Generalized Anxiety Score 05/25/2018 11/24/2016  Nervous, Anxious, on Edge 2 0  Control/stop worrying 0 0  Worry too much - different things 1 0  Trouble relaxing 1 0  Restless 1 0  Easily annoyed or irritable 0 0  Afraid - awful might happen 0 0  Total GAD 7 Score 5 0   CHA2DS2-VASc Score = 4  The patient's score is based upon: CHF History: No HTN History: Yes Diabetes History: Yes Stroke History: No Vascular Disease History: No Age Score: 2 Gender Score: 0     Social History   Tobacco Use  Smoking Status Never  Smokeless Tobacco Never   BP Readings from Last 3 Encounters:  10/17/20 (!) 148/72  07/17/20 (!) 156/88  03/27/20 (!) 144/82   Pulse Readings from Last 3 Encounters:  10/17/20 (!) 56  07/17/20 87  03/27/20 63   Wt Readings from Last 3 Encounters:  10/17/20 167 lb 9.6 oz (76 kg)  07/17/20 166 lb (75.3 kg)  03/27/20 168 lb (76.2 kg)   BMI Readings from Last 3 Encounters:  10/17/20 25.30 kg/m  07/17/20 23.15 kg/m  03/27/20 23.43 kg/m     Assessment/Interventions: Review of patient past medical history, allergies, medications, health status, including review of consultants reports, laboratory and other test data, was performed as part of comprehensive evaluation and provision of chronic care management services.   SDOH:  (Social Determinants of Health) assessments and interventions performed: Yes   CCM Care Plan  Allergies  Allergen Reactions   Lisinopril Cough    Medications Reviewed Today     Reviewed by Knox Royalty, RN (Registered Nurse) on 11/30/20 at [phone]  Med List Status: <None>   Medication Order Taking? Sig Documenting Provider  Last Dose Status Informant  ALPRAZolam (XANAX) 0.5 MG tablet [id number] No TAKE 1 TABLET BY MOUTH DAILY AS NEEDED FOR ANXIETY [name], MD Taking Active   apixaban (ELIQUIS) 2.5 MG TABS tablet [id number] No Take 1 tablet (2.5 mg total) by mouth 2 (two) times daily. [name], MD Taking Active   atorvastatin (LIPITOR) 20 MG tablet [id number] No TAKE 1 TABLET BY MOUTH DAILY. [name], MD Taking Active   benzonatate (TESSALON PERLES) 100 MG capsule [id number] No Take 1 capsule (100 mg total) by mouth 3 (three) times daily as needed. [name], DO Taking  Active   blood glucose meter kit and supplies KIT [id number] No Use to test blood sugar once daily. DX: E11.9 [name], MD Taking Active   Blood Glucose Monitoring Suppl (FREESTYLE LITE) DEVI [id number] No  [provider] Taking Active   Cholecalciferol (VITAMIN D) 1000 UNITS capsule [id number] No Take 1,000 Units by mouth daily. [provider] Taking Active            Med Note Thurmond Butts, Johny Drilling   Fri Jan 16, 2017 10:06 AM)    Continuous Blood Gluc Receiver (FREESTYLE LIBRE 2 READER) DEVI [id number] No USE AS DIRECTED [name], MD Taking Active   Continuous Blood Gluc Sensor (FREESTYLE LIBRE 2 SENSOR) MISC [id number] No USE AS DIRECTED [name], MD Taking Active   glucose blood (FREESTYLE LITE) test strip [id number] No USE TO CHECK BLOOD SUGAR ONCE DAILY [name], MD Taking Active   Insulin Glargine-yfgn (SEMGLEE, YFGN,) 100 UNIT/ML SOPN [id number] No Inject 15 Units into the skin daily. [name], MD Taking Active   Insulin Pen Needle 31G X 6 MM MISC [id number] No USE TO INJECT INSULIN 5 TIMES DAILY [name], MD Taking Active   L-Methylfolate-Algae-B12-B6 3-90.314-2-35 MG CAPS [id number] No TAKE 1 CAPSULE BY MOUTH 2 (TWO) TIMES DAILY. [name], MD Taking Active            Med Note Malena Catholic Aug 15, 2020  2:28 PM) Not covered by insurance ~75 out of pocket   Lancets MISC [id number] No Use to test blood sugar once daily. DX E11.09 [name], MD Taking Active   metoprolol succinate (TOPROL-XL) 25 MG 24 hr tablet [id number] No TAKE 1 TABLET (25 MG TOTAL) BY MOUTH DAILY. Belva Crome, MD Taking Active   Multiple Vitamins-Minerals (CENTRUM SILVER PO) [id number] No Take by mouth. [provider] Taking Active   pantoprazole (PROTONIX) 40 MG tablet [id number] No TAKE 1 TABLET BY MOUTH ONCE DAILY. [name], MD Taking Active   tamsulosin [facility]) 0.4 MG CAPS capsule [id number] No TAKE 1 CAPSULE BY MOUTH DAILY. [name], MD Taking Active   Discontinued 07/17/20 1749 (Discontinued by provider)             Patient Active Problem List   Diagnosis Date Noted   Encounter for general adult medical examination with abnormal findings 07/18/2020   Atrial fibrillation by electrocardiogram ([place]) 01/12/2020   Murmur, cardiac 01/04/2020   Bradycardia 01/04/2020   Internal hemorrhoid 12/08/2019   Tinea cruris 12/05/2019   Seborrheic dermatitis of scalp 05/05/2019   Diabetic polyneuropathy associated with type 2 diabetes mellitus ([place]) 03/29/2019   Seasonal allergic rhinitis due to pollen 05/24/2018   Sciatica 10/21/2017   Chronic renal disease, stage 3, moderately decreased glomerular filtration rate (GFR) between 30-59 mL/min/1.73 square meter ([place]) 07/02/2017   Routine general medical examination at a health care facility 12/26/2013   DDD (degenerative disc disease), cervical 12/26/2013   Hypertension    Hyperlipidemia with target LDL less than 100    Type II diabetes mellitus with manifestations (HCC)    GERD (gastroesophageal reflux disease)    DJD (degenerative joint disease)    Anxiety     Immunization History  Administered Date(s) Administered   Fluad Quad(high Dose 65+) 02/24/2019   Influenza Split 03/05/2011, 03/23/2012   Influenza Whole 03/23/2002, 05/16/2009   Influenza, High Dose Seasonal PF 03/23/2017, 03/27/2018    Influenza-Unspecified 04/23/2006, 03/23/2008, 04/23/2009, 03/23/2014, 03/23/2016, 03/23/2020  PFIZER(Purple Top)SARS-COV-2 Vaccination 06/15/2019, 07/06/2019, 10/12/2020   Pneumococcal Conjugate-13 12/26/2013   Pneumococcal Polysaccharide-23 03/18/2006, 05/24/2018   Pneumococcal-Unspecified 07/20/2005   Tdap 04/08/2012   Zoster, Live 08/08/2004    Conditions to be addressed/monitored:  Hypertension, Hyperlipidemia, Diabetes, Atrial Fibrillation and Chronic Kidney Disease  There are no care plans that you recently modified to display for this patient.     Medication Assistance: None required.  Patient affirms current coverage meets needs.  Compliance/Adherence/Medication fill history: Care Gaps: Shingrix Eye exam (due 12/10/19) Foot exam (due 09/21/20)  Star-Rating Drugs: Atorvastatin - LF 10/31/20 x 90 ds   Patient's preferred pharmacy is:  [facility] [address] [place] [address] Phone: [phone] Fax: [phone]  Uses pill box? No - prefers bottles Pt endorses 100% compliance  We discussed: Current pharmacy is preferred with insurance plan and patient is satisfied with pharmacy services Patient decided to: Continue current medication management strategy  Care Plan and Follow Up Patient Decision:  Patient agrees to Care Plan and Follow-up.  Plan: Telephone follow up appointment with care management team member scheduled for:  1 month  Charlene Brooke, PharmD, [place], CPP Clinical Pharmacist [facility] [phone]    Current Barriers:  Unable to independently monitor therapeutic efficacy Unable to achieve control of diabetes  Suboptimal therapeutic regimen for diabetes  Pharmacist Clinical Goal(s):  Patient will achieve adherence to monitoring guidelines and medication adherence to achieve therapeutic efficacy achieve control of diabetes as evidenced by improvement in blood sugar  readings adhere to plan to optimize therapeutic regimen for diabetes as evidenced by report of adherence to recommended medication management changes through collaboration with PharmD and provider.   Interventions: 1:1 collaboration with [name], MD regarding development and update of comprehensive plan of care as evidenced by provider attestation and co-signature Inter-disciplinary care team collaboration (see longitudinal plan of care) Comprehensive medication review performed; medication list updated in electronic medical record  Hypertension (BP goal <140/90) -Not ideally controlled - pt is not checking BP at home and it has been above goal in office recently -Current treatment: Metoprolol succinate 25 mg daily -Medications previously tried: chlorthalidone, lisinopril, telmisartan -Current home readings: not checking -Current exercise habits: walks hospital hallways at work -Denies hypotensive/hypertensive symptoms -Educated on BP goals and benefits of medications for prevention of heart attack, stroke and kidney damage; Importance of home blood pressure monitoring; -Counseled to monitor BP at home daily, document, and provide log at future appointments -Recommended to continue current medication  Hyperlipidemia: (LDL goal < 100) -Controlled -Current treatment: Atorvastatin 20 mg daily -Educated on Cholesterol goals; Benefits of statin for ASCVD risk reduction; -Recommended to continue current medication  Diabetes (A1c goal <7%) -Improved -A1c at recent PCP appt improved to 7.9%. Pt reports Freestyle Elenor Legato is working well and he is compliant with insulin; he reports BG spikes high after eating; he endorses dietary indiscretions (fast food, cornbread) that raise BG significantly; he recently had low BG 66 in AM after skipping dinner night before, his CGM notified him and he ate a large serving of pie, BG quickly rose to 248 -Pt did start Ozempic 0.25 mg weekly, but he says a  nurse told him to stop and continue insulin only. Reason for stopping is unclear, pt reports Ozempic was more expensive than his other meds (insulin is free) -Current medications: Semglee 15 units daily Freestyle Libre 2 -Medications previously tried: Barista, metformin  -Current home glucose readings fasting glucose: 120-150 Post-prandial glucose: 240-260 -  Counseled on dangers of highly variable sugars, consistent elevations in BG; pt will likely benefit from GLP-1 agonist that targets post-prandial BG; it is likely that patient will be able to convert to GLP-1 only regimen without insulin, which would be safer due to lower hypoglycemia risk; pt is currently recovering from URI/cold so will defer restarting Ozempic another month; can consider PAP for Ozempic if cost is the problem -Counseled on prevention and treatment of hypoglycemia - advised not to skip meals, rule of 15's -Plan: Continue medications as above. Plan to restart Ozempic when patient recovers from URI  Atrial Fibrillation (Goal: prevent stroke and major bleeding) -Controlled -CHADSVASC: 4 -Current treatment: Rate control: Metoprolol succinate 25 mg daily Anticoagulation: Eliquis 2.5 mg BID -Counseled on increased risk of stroke due to Afib and benefits of anticoagulation for stroke prevention; importance of adherence to anticoagulant exactly as prescribed; avoidance of NSAIDs due to increased bleeding risk with anticoagulants; seeking medical attention after a head injury or if there is blood in the urine/stool; -Recommended to continue current medication  Patient Goals/Self-Care Activities Patient will:  - take medications as prescribed focus on medication adherence by routine check glucose via [facility], document, and provide at future appointments check blood pressure daily, document, and provide at future appointments -Do not skip meals. If sugar is low (<70), treat with 4 oz of orange juice or regular soda,  wait 15 min and re-check sugar. If sugar is still not > 70, repeat process until it is.

## 2020-12-26 NOTE — Telephone Encounter (Signed)
Patient missed his appointment time, wife Manan Olmo has called on his behalf to try and speak with [name].  Best call back number: [phone]

## 2020-12-26 NOTE — Telephone Encounter (Signed)
  Chronic Care Management   Outreach Note  12/26/2020 Name: SHAQUAN MISSEY MRN: [id number] DOB: March 11, 1930  Referred by: [name], MD  Patient had a phone appointment scheduled with clinical pharmacist today.  An unsuccessful telephone outreach was attempted today. The patient was referred to the pharmacist for assistance with medications, care management and care coordination.   Patient will NOT be penalized in any way for missing a CCM appointment. The no-show fee does not apply.  If possible, a message was left to return call to: [phone] or to [facility]: [place], PharmD, [place], CPP Clinical Pharmacist [facility] at [facility] [phone]

## 2020-12-28 ENCOUNTER — Ambulatory Visit (INDEPENDENT_AMBULATORY_CARE_PROVIDER_SITE_OTHER): Payer: 59 | Admitting: *Deleted

## 2020-12-28 ENCOUNTER — Encounter: Payer: Self-pay | Admitting: Internal Medicine

## 2020-12-28 ENCOUNTER — Ambulatory Visit (INDEPENDENT_AMBULATORY_CARE_PROVIDER_SITE_OTHER): Payer: 59

## 2020-12-28 ENCOUNTER — Ambulatory Visit (INDEPENDENT_AMBULATORY_CARE_PROVIDER_SITE_OTHER): Payer: 59 | Admitting: Podiatry

## 2020-12-28 ENCOUNTER — Ambulatory Visit (INDEPENDENT_AMBULATORY_CARE_PROVIDER_SITE_OTHER): Payer: 59 | Admitting: Internal Medicine

## 2020-12-28 ENCOUNTER — Other Ambulatory Visit: Payer: Self-pay

## 2020-12-28 VITALS — BP 152/68 | HR 61 | Temp 98.0°F | Wt 160.0 lb

## 2020-12-28 DIAGNOSIS — M19012 Primary osteoarthritis, left shoulder: Secondary | ICD-10-CM | POA: Diagnosis not present

## 2020-12-28 DIAGNOSIS — M2042 Other hammer toe(s) (acquired), left foot: Secondary | ICD-10-CM

## 2020-12-28 DIAGNOSIS — E118 Type 2 diabetes mellitus with unspecified complications: Secondary | ICD-10-CM

## 2020-12-28 DIAGNOSIS — I10 Essential (primary) hypertension: Secondary | ICD-10-CM | POA: Diagnosis not present

## 2020-12-28 DIAGNOSIS — M25512 Pain in left shoulder: Secondary | ICD-10-CM | POA: Diagnosis not present

## 2020-12-28 DIAGNOSIS — M25511 Pain in right shoulder: Secondary | ICD-10-CM

## 2020-12-28 DIAGNOSIS — M25312 Other instability, left shoulder: Secondary | ICD-10-CM | POA: Insufficient documentation

## 2020-12-28 DIAGNOSIS — M2041 Other hammer toe(s) (acquired), right foot: Secondary | ICD-10-CM | POA: Diagnosis not present

## 2020-12-28 DIAGNOSIS — E119 Type 2 diabetes mellitus without complications: Secondary | ICD-10-CM | POA: Diagnosis not present

## 2020-12-28 DIAGNOSIS — M79641 Pain in right hand: Secondary | ICD-10-CM | POA: Insufficient documentation

## 2020-12-28 DIAGNOSIS — M79642 Pain in left hand: Secondary | ICD-10-CM | POA: Insufficient documentation

## 2020-12-28 DIAGNOSIS — M19011 Primary osteoarthritis, right shoulder: Secondary | ICD-10-CM | POA: Diagnosis not present

## 2020-12-28 DIAGNOSIS — E1149 Type 2 diabetes mellitus with other diabetic neurological complication: Secondary | ICD-10-CM

## 2020-12-28 LAB — BASIC METABOLIC PANEL
BUN: 25 mg/dL — ABNORMAL HIGH (ref 6–23)
CO2: 26 mEq/L (ref 19–32)
Calcium: 9.2 mg/dL (ref 8.4–10.5)
Chloride: 109 mEq/L (ref 96–112)
Creatinine, Ser: 1.57 mg/dL — ABNORMAL HIGH (ref 0.40–1.50)
GFR: 39.08 mL/min — ABNORMAL LOW (ref >60.00)
Glucose, Bld: 93 mg/dL (ref 70–99)
Potassium: 4.2 mEq/L (ref 3.5–5.1)
Sodium: 141 mEq/L (ref 135–145)

## 2020-12-28 LAB — HEPATIC FUNCTION PANEL
ALT: 16 U/L (ref 0–53)
AST: 19 U/L (ref 0–37)
Albumin: 4.1 g/dL (ref 3.5–5.2)
Alkaline Phosphatase: 63 U/L (ref 39–117)
Bilirubin, Direct: 0.2 mg/dL (ref 0.0–0.3)
Total Bilirubin: 1.2 mg/dL (ref 0.2–1.2)
Total Protein: 7.5 g/dL (ref 6.0–8.3)

## 2020-12-28 LAB — CBC WITH DIFFERENTIAL/PLATELET
Basophils Absolute: 0 10*3/uL (ref 0.0–0.1)
Basophils Relative: 0.4 % (ref 0.0–3.0)
Eosinophils Absolute: 0.1 10*3/uL (ref 0.0–0.7)
Eosinophils Relative: 2.1 % (ref 0.0–5.0)
HCT: 36.5 % — ABNORMAL LOW (ref 39.0–52.0)
Hemoglobin: 12.4 g/dL — ABNORMAL LOW (ref 13.0–17.0)
Lymphocytes Relative: 17.9 % (ref 12.0–46.0)
Lymphs Abs: 1.2 10*3/uL (ref 0.7–4.0)
MCHC: 34.1 g/dL (ref 30.0–36.0)
MCV: 91.9 fl (ref 78.0–100.0)
Monocytes Absolute: 0.6 10*3/uL (ref 0.1–1.0)
Monocytes Relative: 9 % (ref 3.0–12.0)
Neutro Abs: 4.8 10*3/uL (ref 1.4–7.7)
Neutrophils Relative %: 70.6 % (ref 43.0–77.0)
Platelets: 195 10*3/uL (ref 150.0–400.0)
RBC: 3.97 Mil/uL — ABNORMAL LOW (ref 4.22–5.81)
RDW: 14.3 % (ref 11.5–15.5)
WBC: 6.7 10*3/uL (ref 4.0–10.5)

## 2020-12-28 LAB — SEDIMENTATION RATE: Sed Rate: 26 mm/hr — ABNORMAL HIGH (ref 0–20)

## 2020-12-28 LAB — C-REACTIVE PROTEIN: CRP: <1 mg/dL (ref 0.5–20.0)

## 2020-12-28 NOTE — Progress Notes (Signed)
Patient ID: Dale Anderson, male   DOB: 12-13-1929, 87 y.o.   MRN: [id number]        Chief Complaint: follow up recent worsening bilateral shoulder pain       HPI:  Dale Anderson is a 87 y.o. male here with c/o worsening bilateral shoulder pain, left > right, has hx of left rot cuff injury and surgury approx 25 yrs ago, but in the past month now with acute worsening left shoulder pain, mod, constant, dull, worse to abduct to only 90 degrees, better to not do this; nothing else makes better or worse.  Right shoulder maybe slightly worse than usual b/c has had to use more in compensation it seems but still has mostly full ROM.  No trauma, falls, other injury.  BP at home has been mostly < 140/90, always seem high when comes here.  Also has bilateral hand/finger arthitis worse in the past month as well, has difficulty making fists right > left due to decreased ROM finger joints several.  Pt denies chest pain, increased sob or doe, wheezing, orthopnea, PND, increased LE swelling, palpitations, dizziness or syncope.   Pt denies polydipsia, polyuria, or new focal neuro s/s.  Pt does not want oral pain med if possible       Wt Readings from Last 3 Encounters:  12/28/20 160 lb (72.6 kg)  10/17/20 167 lb 9.6 oz (76 kg)  07/17/20 166 lb (75.3 kg)   BP Readings from Last 3 Encounters:  12/28/20 (!) 152/68  10/17/20 (!) 148/72  07/17/20 (!) 156/88         Past Medical History:  Diagnosis Date   Adhesive capsulitis of right shoulder    Anxiety    Diabetes mellitus (HCC)    DJD (degenerative joint disease)    GERD (gastroesophageal reflux disease)    Hemorrhoid    History of shingles    Hyperlipidemia    Hypertension    Nodular prostate without urinary obstruction    Recurrent bronchiectasis (HCC)    Tear of right rotator cuff    Past Surgical History:  Procedure Laterality Date   COLONOSCOPY  1998   HAND RECONSTRUCTION     right   INGUINAL HERNIA REPAIR  2006   Right.  Dr. Bubba Camp    ROTATOR CUFF REPAIR  1995   Left.  Dr. Wonda Olds   SHOULDER ARTHROSCOPY WITH ROTATOR CUFF REPAIR AND SUBACROMIAL DECOMPRESSION  05/25/2012   Procedure: SHOULDER ARTHROSCOPY WITH ROTATOR CUFF REPAIR AND SUBACROMIAL DECOMPRESSION;  Surgeon: Lorn Junes, MD;  Location: [facility];  Service: Orthopedics;  Laterality: Right;  Right Shoulder Arthroscopy shoulder decompression subacromial partial acromioplasty with coracoaromial release, arthroscopy shoulder distal claviculectomy, arthroscopy shoulder with rotator cuff repai    reports that he has never smoked. He has never used smokeless tobacco. He reports that he does not drink alcohol and does not use drugs. family history includes Emphysema in an other family member; Heart disease in his father. Allergies  Allergen Reactions   Lisinopril Cough   Current Outpatient Medications on File Prior to Visit  Medication Sig Dispense Refill   ALPRAZolam (XANAX) 0.5 MG tablet TAKE 1 TABLET BY MOUTH DAILY AS NEEDED FOR ANXIETY 30 tablet 3   apixaban (ELIQUIS) 2.5 MG TABS tablet Take 1 tablet (2.5 mg total) by mouth 2 (two) times daily. 180 tablet 1   atorvastatin (LIPITOR) 20 MG tablet TAKE 1 TABLET BY MOUTH DAILY. 90 tablet 1   benzonatate (TESSALON PERLES) 100 MG  capsule Take 1 capsule (100 mg total) by mouth 3 (three) times daily as needed. 20 capsule 0   blood glucose meter kit and supplies KIT Use to test blood sugar once daily. DX: E11.9 1 each 0   Blood Glucose Monitoring Suppl (FREESTYLE LITE) DEVI   0   Cholecalciferol (VITAMIN D) 1000 UNITS capsule Take 1,000 Units by mouth daily.     Continuous Blood Gluc Receiver (FREESTYLE LIBRE 2 READER) DEVI USE AS DIRECTED 2 each 5   Continuous Blood Gluc Sensor (FREESTYLE LIBRE 2 SENSOR) MISC USE AS DIRECTED 2 each 5   glucose blood (FREESTYLE LITE) test strip USE TO CHECK BLOOD SUGAR ONCE DAILY 100 strip 1   Insulin Glargine-yfgn (SEMGLEE, YFGN,) 100 UNIT/ML SOPN Inject 10 Units into the  skin daily. (Patient taking differently: Inject 15 Units into the skin daily.) 9 mL 3   Insulin Glargine-yfgn (SEMGLEE, YFGN,) 100 UNIT/ML SOPN Inject 15 Units into the skin daily. 15 mL 1   Insulin Pen Needle 31G X 6 MM MISC USE TO INJECT INSULIN 5 TIMES DAILY 100 each 5   L-Methylfolate-Algae-B12-B6 3-90.314-2-35 MG CAPS TAKE 1 CAPSULE BY MOUTH 2 (TWO) TIMES DAILY. 60 capsule 5   Lancets MISC Use to test blood sugar once daily. DX E11.09 100 each 3   metoprolol succinate (TOPROL-XL) 25 MG 24 hr tablet TAKE 1 TABLET (25 MG TOTAL) BY MOUTH DAILY. 90 tablet 3   Multiple Vitamins-Minerals (CENTRUM SILVER PO) Take by mouth.     pantoprazole (PROTONIX) 40 MG tablet TAKE 1 TABLET BY MOUTH ONCE DAILY. 90 tablet 1   Semaglutide (OZEMPIC, 0.25 OR 0.5 MG/DOSE, [place]) Inject 0.25 mg into the skin once a week. Increase to 0.5 mg after 4 weeks     tamsulosin (FLOMAX) 0.4 MG CAPS capsule TAKE 1 CAPSULE BY MOUTH DAILY. 90 capsule 1   aspirin 81 MG chewable tablet Chew by mouth.     lisinopril (ZESTRIL) 40 MG tablet Take 0.5 tablets by mouth daily.     metFORMIN (GLUCOPHAGE) 500 MG tablet Take 1 tablet by mouth daily.     omeprazole (PRILOSEC) 20 MG capsule Take 1 tablet by mouth daily.     terazosin (HYTRIN) 5 MG capsule Take 1 capsule by mouth at bedtime.     [DISCONTINUED] telmisartan (MICARDIS) 40 MG tablet TAKE 1 TABLET BY MOUTH DAILY. 90 tablet 1   No current facility-administered medications on file prior to visit.        ROS:  All others reviewed and negative.  Objective        PE:  BP (!) 152/68 (BP Location: Right Arm, Patient Position: Sitting, Cuff Size: Normal)   Pulse 61   Temp 98 F (36.7 C) (Oral)   Wt 160 lb (72.6 kg)   SpO2 96%   BMI 24.15 kg/m                 Constitutional: Pt appears in NAD               HENT: Head: NCAT.                Right Ear: External ear normal.                 Left Ear: External ear normal.                Eyes: . Pupils are equal, round, and reactive to  light. Conjunctivae and EOM are normal  Nose: without d/c or deformity               Neck: Neck supple. Gross normal ROM               Cardiovascular: Normal rate and regular rhythm.                 Pulmonary/Chest: Effort normal and breath sounds without rales or wheezing.                Abd:  Soft, NT, ND, + BS, no organomegaly               Neurological: Pt is alert. At baseline orientation, motor grossly intact               Skin: Skin is warm. No rashes, no other new lesions, LE edema - none; left shoulder NT with reduced ROM with pain at 90 degrees on abduction; right shoulder NT with near FROM               Psychiatric: Pt behavior is normal without agitation   Micro: none  Cardiac tracings I have personally interpreted today:  none  Pertinent Radiological findings (summarize): December 26 2013 c spine films IMPRESSION:  Severe diffuse degenerative change cervical spine.   Lab Results  Component Value Date   WBC 6.7 12/28/2020   HGB 12.4 (L) 12/28/2020   HCT 36.5 (L) 12/28/2020   PLT 195.0 12/28/2020   GLUCOSE 93 12/28/2020   CHOL 133 01/04/2020   TRIG 85 01/04/2020   HDL 44 01/04/2020   LDLCALC 72 01/04/2020   ALT 16 12/28/2020   AST 19 12/28/2020   NA 141 12/28/2020   K 4.2 12/28/2020   CL 109 12/28/2020   CREATININE 1.57 (H) 12/28/2020   BUN 25 (H) 12/28/2020   CO2 26 12/28/2020   TSH 2.32 07/17/2020   PSA 2.55 08/21/2015   HGBA1C 7.9 (H) 10/17/2020   MICROALBUR 10.0 (H) 10/17/2020   Assessment/Plan:  Dale Anderson is a 87 y.o. Black or African American [2] male with  has a past medical history of Adhesive capsulitis of right shoulder, Anxiety, Diabetes mellitus ([place]), DJD (degenerative joint disease), GERD (gastroesophageal reflux disease), Hemorrhoid, History of shingles, Hyperlipidemia, Hypertension, Nodular prostate without urinary obstruction, Recurrent bronchiectasis ([place]), and Tear of right rotator cuff.  Bilateral hand pain C/w OA advanced,  for voltaren gel prn,  to f/u any worsening symptoms or concerns  Bilateral shoulder pain Likely OA bilateral shoulders, for bilateral xray, refer sports medicine, also check esr, crp r/o PMR  Rotator cuff insufficiency of left shoulder New worsening again after remote previous injury and surgical repair, pt would be less ideal candidate for surgury, for now for shoulder xray, and refer sports medicine for further consideration  Hypertension Uncontrolled today, pt declines change in tx for now as states BP at home < 140/90  BP Readings from Last 3 Encounters:  12/28/20 (!) 152/68  10/17/20 (!) 148/72  07/17/20 (!) 156/88   Followup: Return if symptoms worsen or fail to improve.  Cathlean Cower, MD 01/01/2021 8:10 PM [facility]

## 2020-12-28 NOTE — Chronic Care Management (AMB) (Signed)
Chronic Care Management   CCM RN Visit Note  12/28/2020 Name: Dale Anderson MRN: [id number] DOB: 1930/04/23  Subjective: Dale Anderson is a 87 y.o. year old male who is a primary care patient of [name], MD. The care management team was consulted for assistance with disease management and care coordination needs.    Engaged with patient and his spouse Dale Anderson on [place] by telephone with speaker mode on for acute follow up visit in response to provider referral for case management and/or care coordination services.   Patient and his wife contacted me this morning, concerned about our next scheduled appointment, which they report is a PCP appointment; explained that this our scheduled telephone appointment, but they state that office staff informed that the appointment was an in-person appointment with "the doctor;" per preference/ request of patient and his spouse, we agreed to complete this appointment today.  I explained to patient and spouse what my scheduled appointments would look like on their future appointment list to avoid this confusion in the future.  Consent to Services:  The patient was given information about Chronic Care Management services, agreed to services, and gave verbal consent prior to initiation of services.  Please see initial visit note for detailed documentation.  Patient agreed to services and verbal consent obtained.   Assessment: Review of patient past medical history, allergies, medications, health status, including review of consultants reports, laboratory and other test data, was performed as part of comprehensive evaluation and provision of chronic care management services.   CCM Care Plan  Allergies  Allergen Reactions   Lisinopril Cough    Outpatient Encounter Medications as of 12/28/2020  Medication Sig Note   Insulin Glargine-yfgn (SEMGLEE, YFGN,) 100 UNIT/ML SOPN Inject 10 Units into the skin daily. (Patient taking differently: Inject  15 Units into the skin daily.)    Semaglutide (OZEMPIC, 0.25 OR 0.5 MG/DOSE, [place]) Inject 0.25 mg into the skin once a week. Increase to 0.5 mg after 4 weeks 12/28/2020: Reported 12/28/20 that he is taking "1.25 mg" "every Sunday"   ALPRAZolam (XANAX) 0.5 MG tablet TAKE 1 TABLET BY MOUTH DAILY AS NEEDED FOR ANXIETY    apixaban (ELIQUIS) 2.5 MG TABS tablet Take 1 tablet (2.5 mg total) by mouth 2 (two) times daily.    atorvastatin (LIPITOR) 20 MG tablet TAKE 1 TABLET BY MOUTH DAILY.    benzonatate (TESSALON PERLES) 100 MG capsule Take 1 capsule (100 mg total) by mouth 3 (three) times daily as needed.    blood glucose meter kit and supplies KIT Use to test blood sugar once daily. DX: E11.9    Blood Glucose Monitoring Suppl (FREESTYLE LITE) DEVI     Cholecalciferol (VITAMIN D) 1000 UNITS capsule Take 1,000 Units by mouth daily.    Continuous Blood Gluc Receiver (FREESTYLE LIBRE 2 READER) DEVI USE AS DIRECTED    Continuous Blood Gluc Sensor (FREESTYLE LIBRE 2 SENSOR) MISC USE AS DIRECTED    glucose blood (FREESTYLE LITE) test strip USE TO CHECK BLOOD SUGAR ONCE DAILY    Insulin Glargine-yfgn (SEMGLEE, YFGN,) 100 UNIT/ML SOPN Inject 15 Units into the skin daily.    Insulin Pen Needle 31G X 6 MM MISC USE TO INJECT INSULIN 5 TIMES DAILY    L-Methylfolate-Algae-B12-B6 3-90.314-2-35 MG CAPS TAKE 1 CAPSULE BY MOUTH 2 (TWO) TIMES DAILY. 08/15/2020: Not covered by insurance ~75 out of pocket   Lancets MISC Use to test blood sugar once daily. DX E11.09    metoprolol succinate (TOPROL-XL) 25  MG 24 hr tablet TAKE 1 TABLET (25 MG TOTAL) BY MOUTH DAILY.    Multiple Vitamins-Minerals (CENTRUM SILVER PO) Take by mouth.    pantoprazole (PROTONIX) 40 MG tablet TAKE 1 TABLET BY MOUTH ONCE DAILY.    tamsulosin (FLOMAX) 0.4 MG CAPS capsule TAKE 1 CAPSULE BY MOUTH DAILY.    [DISCONTINUED] telmisartan (MICARDIS) 40 MG tablet TAKE 1 TABLET BY MOUTH DAILY.    No facility-administered encounter medications on file as of 12/28/2020.     Patient Active Problem List   Diagnosis Date Noted   Bilateral shoulder pain 12/28/2020   Rotator cuff insufficiency of left shoulder 12/28/2020   Bilateral hand pain 12/28/2020   Encounter for general adult medical examination with abnormal findings 07/18/2020   Atrial fibrillation by electrocardiogram ([place]) 01/12/2020   Murmur, cardiac 01/04/2020   Bradycardia 01/04/2020   Internal hemorrhoid 12/08/2019   Tinea cruris 12/05/2019   Seborrheic dermatitis of scalp 05/05/2019   Diabetic polyneuropathy associated with type 2 diabetes mellitus ([place]) 03/29/2019   Seasonal allergic rhinitis due to pollen 05/24/2018   Sciatica 10/21/2017   Chronic renal disease, stage 3, moderately decreased glomerular filtration rate (GFR) between 30-59 mL/min/1.73 square meter ([place]) 07/02/2017   Routine general medical examination at a health care facility 12/26/2013   DDD (degenerative disc disease), cervical 12/26/2013   Hypertension    Hyperlipidemia with target LDL less than 100    Type II diabetes mellitus with manifestations (HCC)    GERD (gastroesophageal reflux disease)    DJD (degenerative joint disease)    Anxiety     Conditions to be addressed/monitored:  HTN and DMII  Care Plan : Diabetes Type 2 (Adult)  Updates made by Knox Royalty, RN since 12/28/2020 12:00 AM     Problem: Glycemic Management (Diabetes, Type 2)   Priority: High     Long-Range Goal: Glycemic Management Optimized   Start Date: 11/30/2020  Expected End Date: 11/30/2021  This Visit's Progress: On track  Recent Progress: On track  Priority: Medium  Note:   Objective:  Lab Results  Component Value Date   HGBA1C 8.4 (H) 07/17/2020   Lab Results  Component Value Date   CREATININE 1.74 (H) 07/17/2020   CREATININE 1.56 (H) 01/04/2020   CREATININE 1.51 (H) 09/22/2019   No results found for: EGFR Current Barriers:  Knowledge Deficits related to basic Diabetes pathophysiology and self care/management: lacks  understanding of significance of monitoring and reaching A1-C target; needs ongoing reeducation/ reinforcement of carb modified diet Knowledge Deficits related to medications used for management of diabetes: lacks understanding of difference between long and short acting insulins Knowledge Deficits related to self administration of injectable diabetes medications- recently discovered he was not using long acting insulin correctly and needs reinforcement and follow up with pharmacist; patient had not been removing cap of long-acting insulin and was not receiving prescribed dose, he has since been unable to re-fill this medication through his outpatient pharmacy Case Manager Clinical Goal(s):  Goal re-established and extended 11/30/20 Over the next 12 months, patient will demonstrate improved adherence to prescribed treatment plan for diabetes self care/management as evidenced by:  daily monitoring and recording of CBG 3-4 times per day  adherence to ADA/ carb modified diet  adherence to prescribed medication regimen contacting provider for new or worsened symptoms or questions Interventions:  Collaboration with [name], MD regarding development and update of comprehensive plan of care as evidenced by provider attestation and co-signature Inter-disciplinary care team collaboration (see longitudinal plan  of care); direct collaboration with Charlene Brooke, Pharm D regarding medication management concerns Received acute call from patient and his spouse, regarding confusion around our next scheduled appointment: they request the appointment be completed today- this was done per patient/ spouse request Confirmed patient continues monitoring/ recording blood sugars at home using FSL: reports consistent fasting blood sugar ranges between 90-110; this am fasting reported as: 89; reports consistent post-prandial values between 126-145 Confirms he is taking BOTH weekly Ozempic at "1.25.mg on Sundays;"  and Semglee insulin "every day, once a day;" noted through review of EHR that Ozempic is not included on his current medication list: real-time care coordination outreach via secure chat in EHR with Charlene Brooke Pharmacist, who confirms that patient should be taking both Ozempice and Semglee insulin, as he reports currently doing-- confirmed [name] added Ozempic to medication list in EHR; noted patient has scheduled office visit with Dr. Jenny Reichmann (covering for Dr. Ronnald Ramp today)-- updated Dr. Jenny Reichmann of care coordination outcome to confirm patient should be taking both medications Encouraged patient's ongoing engagement with CCM pharmacy team Confirmed no recent signs/ symptoms hypo- or hyper- glycemia at home Confirmed patient has not yet scheduled annual eye/ vision provider appointment: encouraged him to do so and explained rationale for prevention of complications from high blood sugar Confirmed patient continues working full time, denies clinical concerns today Reviewed upcoming provider appointments with patient: has appointments today with PCP to discuss shoulder pain; podaitry appointment this afternoon- verbalizes plans to attend both as scheduled Discussed plans with patient for ongoing care management follow up and confirmed patient has direct contact information for care management team Patient Goals/Self-Care Activities UNABLE to independently verbalize A1-C goal and needs reinforcement around use of and purpose of medications for DM Self administers medications as prescribed Self administers insulin as prescribed Great job monitoring your blood sugars at home: keep up the great work!  The blood sugars we reviewed today are in good range I confirmed with the pharmacist that you are supposed to continue taking both the weekly Ozempic and the daily insulin- keep taking as you have been instructed Continue checking blood sugars at home 3-4 times per day; write down all blood sugars and when you  are taking your blood sugars: please write down what the "fasting (before eating/ morning)" blood sugars are and what the "2 hour after-eating" blood sugars are: these values help your doctor dose your insulin to make sure it is not too much or too little check blood sugar if I feel it is too high or too low take the blood sugar log to all doctor visits--- call your care providers if you have questions or concerns As we discussed today, try to develop a more regular routine for your eating/ meal times Please make an appointment for your annual eye/ vision evaluation If you have questions about what medications you should be taking, be  let Pharmacist Charlene Brooke at Dr. Ronnald Ramp office  Follow Up Plan:  Telephone follow up appointment with care management team member scheduled for: Wednesday January 30, 2021 at 11:30 am The patient has been provided with contact information for the care management team and has been advised to call with any health related questions or concerns.      Care Plan : Hypertension (Adult)  Updates made by Knox Royalty, RN since 12/28/2020 12:00 AM     Problem: Hypertension (Hypertension)   Priority: Medium     Long-Range Goal: Hypertension Monitored   Start Date: 11/30/2020  Expected End Date: 11/30/2021  This Visit's Progress: On track  Recent Progress: On track  Priority: Medium  Note:   Objective:  Last practice recorded BP readings:  BP Readings from Last 3 Encounters:  07/17/20 (!) 156/88  03/27/20 (!) 144/82  03/19/20 (!) 144/66  Most recent eGFR/CrCl: No results found for: EGFR  No components found for: CRCL Current Barriers:  Knowledge Deficits related to basic understanding of hypertension pathophysiology and self care management; patient adhering to medication and prescribed diet for HTN; could benefit from ongoing reinforcement of strategies for blood pressure management Unable to independently verbalize blood pressures at home and blood  pressure target ranges Case Manager Clinical Goal(s):  Goal re-established/ extended 11/30/20: Over the next 12 months, patient will demonstrate improved adherence to prescribed treatment plan for hypertension as evidenced by patient reporting during CCM RN CM outreach of: taking all medications as prescribed monitoring and recording blood pressure 2- 3 times per week adhering to low sodium/DASH diet Interventions:  Collaboration with [name], MD regarding development and update of comprehensive plan of care as evidenced by provider attestation and co-signature Inter-disciplinary care team collaboration (see longitudinal plan of care) Discussed current clinical condition with patient: reports "doing fine; having shoulder pain- seeing doctor on call for Dr. Ronnald Ramp today" to address; otherwise denies acute clinical concerns Confirms patient has been monitoring blood pressures at home: reviewed these with patient today: all values stable, reported as 76-160/40-80; patient denies symptoms low blood pressure with isolated low reading at home.  Took BP during our conversation- reported as 158/80; reports blood pressure measurements at home "have all been fine."  Positive reinforcement provided; patient encouraged to continue monitoring/ recording regularly at home Confirms no recent changes to BP medications, reports adherence to all prescribed medications Confirms patient continues following low salt diet- positive reinforcement provided Reinforced previously provided education to patient re: DASH diet, complications of uncontrolled blood pressure; how diabetes affects blood pressure: patient continues to require ongoing reinforcement Self-Care Activities Self administers medications as prescribed Attends all scheduled provider appointments Calls provider office for new concerns, questions, or BP outside discussed parameters Tries to follows a low sodium diet/DASH diet Patient Goals/ The blood  pressures you reported from home recently are in good range- keep up the great work checking your blood pressures at home! Continue checking blood pressure 2- 3 times per week Write blood pressure results in a log or diary: we will review these next time we talk  Take your recorded blood pressure readings at home to your doctor appointments: these values will help the doctor make sure you are on the right medication Continue eating a low-salt, heart healthy diet- this helps manage your diabetes and your blood pressure Follow Up Plan:  Telephone follow up appointment with care management team member scheduled for: Wednesday January 30, 2021 at 11:30 am The patient has been provided with contact information for the care management team and has been advised to call with any health related questions or concerns.      Plan: Telephone follow up appointment with care management team member scheduled for:  Wednesday January 30, 2021 at 11:30 am The patient has been provided with contact information for the care management team and has been advised to call with any health related questions or concerns  [name], RN, BSN, [facility] [phone]: direct office [phone]: mobile

## 2020-12-28 NOTE — Patient Instructions (Signed)
Visit Information  Mr. Mamie Nick and [name]: it was nice talking with you today.   Please read over the attached information, and keep up the great work taking your blood sugars and blood pressures at home!    I look forward to talking to you again for an update on Wednesday January 30, 2021 at 11:30 am- please be listening out for my call that day.  I will call as close to 11:30 am as possible; I look forward to hearing about your progress.   Please don't hesitate to contact me if I can be of assistance to you before our next scheduled appointment.   [name], RN, BSN, [facility] RN Care Coordination- [place] [phone]: direct office [phone]: mobile    PATIENT GOALS:  Goals Addressed             This Visit's Progress    Monitor and Manage My Blood Sugar-Diabetes Type 2   On track    Timeframe:  Long-Range Goal Priority:  Medium Start Date:     11/30/20                        Expected End Date:   11/30/21                    Follow Up Date 01/30/21 at 11:30 am  Great job monitoring your blood sugars at home: keep up the great work!  The blood sugars we reviewed today are in good range I confirmed with the pharmacist that you are supposed to continue taking both the weekly Ozempic and the daily insulin- keep taking as you have been instructed Continue checking blood sugars at home 3-4 times per day; write down all blood sugars and when you are taking your blood sugars: please write down what the "fasting (before eating/ morning)" blood sugars are and what the "2 hour after-eating" blood sugars are: these values help your doctor dose your insulin to make sure it is not too much or too little check blood sugar if I feel it is too high or too low take the blood sugar log to all doctor visits--- call your care providers if you have questions or concerns As we discussed today, try to develop a more regular routine for your eating/ meal times Please  make an appointment for your annual eye/ vision evaluation If you have questions about what medications you should be taking, be  let Pharmacist Charlene Brooke at Dr. Ronnald Ramp office     Why is this important?   Checking your blood sugar at home helps to keep it from getting very high or very low.  Writing the results in a diary or log helps the doctor know how to care for you.  Your blood sugar log should have the time, date and the results.  Also, write down the amount of insulin or other medicine that you take.  Other information, like what you ate, exercise done and how you were feeling, will also be helpful.           Track and Manage My Blood Pressure-Hypertension   On track    Timeframe:  Long-Range Goal Priority:  Medium Start Date:       11/30/20                      Expected End Date:    11/30/21  Follow Up Date 01/30/21   The blood pressures you reported from home recently are in good range- keep up the great work checking your blood pressures at home! Continue checking blood pressure 2- 3 times per week Write blood pressure results in a log or diary: we will review these next time we talk  Take your recorded blood pressure readings at home to your doctor appointments: these values will help the doctor make sure you are on the right medication Continue eating a low-salt, heart healthy diet- this helps manage your diabetes and your blood pressure   Why is this important?   You won't feel high blood pressure, but it can still hurt your blood vessels.  High blood pressure can cause heart or kidney problems. It can also cause a stroke.  Making lifestyle changes like losing a little weight or eating less salt will help.  Checking your blood pressure at home and at different times of the day can help to control blood pressure.  If the doctor prescribes medicine remember to take it the way the doctor ordered.  Call the office if you cannot afford the medicine or  if there are questions about it.             [email or web address]">  DASH Eating Plan DASH stands for Dietary Approaches to Stop Hypertension. The DASH eating plan is a healthy eating plan that has been shown to: Reduce high blood pressure (hypertension). Reduce your risk for type 2 diabetes, heart disease, and stroke. Help with weight loss. What are tips for following this plan? Reading food labels Check food labels for the amount of salt (sodium) per serving. Choose foods with less than 5 percent of the Daily Value of sodium. Generally, foods with less than 300 milligrams (mg) of sodium per serving fit into this eating plan. To find whole grains, look for the word "whole" as the first word in the ingredient list. Shopping Buy products labeled as "low-sodium" or "no salt added." Buy fresh foods. Avoid canned foods and pre-made or frozen meals. Cooking Avoid adding salt when cooking. Use salt-free seasonings or herbs instead of table salt or sea salt. Check with your health care provider or pharmacist before using salt substitutes. Do not fry foods. Cook foods using healthy methods such as baking, boiling, grilling, roasting, and broiling instead. Cook with heart-healthy oils, such as olive, canola, avocado, soybean, or sunflower oil. Meal planning  Eat a balanced diet that includes: 4 or more servings of fruits and 4 or more servings of vegetables each day. Try to fill one-half of your plate with fruits and vegetables. 6-8 servings of whole grains each day. Less than 6 oz (170 g) of lean meat, poultry, or fish each day. A 3-oz (85-g) serving of meat is about the same size as a deck of cards. One egg equals 1 oz (28 g). 2-3 servings of low-fat dairy each day. One serving is 1 cup (237 mL). 1 serving of nuts, seeds, or beans 5 times each week. 2-3 servings of heart-healthy fats. Healthy fats called omega-3 fatty acids are found in foods such  as walnuts, flaxseeds, fortified milks, and eggs. These fats are also found in cold-water fish, such as sardines, salmon, and mackerel. Limit how much you eat of: Canned or prepackaged foods. Food that is high in trans fat, such as some fried foods. Food that is high in saturated fat, such as fatty meat. Desserts and other sweets, sugary drinks, and other foods with added sugar.  Full-fat dairy products. Do not salt foods before eating. Do not eat more than 4 egg yolks a week. Try to eat at least 2 vegetarian meals a week. Eat more home-cooked food and less restaurant, buffet, and fast food.  Lifestyle When eating at a restaurant, ask that your food be prepared with less salt or no salt, if possible. If you drink alcohol: Limit how much you use to: 0-1 drink a day for women who are not pregnant. 0-2 drinks a day for men. Be aware of how much alcohol is in your drink. In the [place] one drink equals one 12 oz bottle of beer (355 mL), one 5 oz glass of wine (148 mL), or one 1 oz glass of hard liquor (44 mL). General information Avoid eating more than 2,300 mg of salt a day. If you have hypertension, you may need to reduce your sodium intake to 1,500 mg a day. Work with your health care provider to maintain a healthy body weight or to lose weight. Ask what an ideal weight is for you. Get at least 30 minutes of exercise that causes your heart to beat faster (aerobic exercise) most days of the week. Activities may include walking, swimming, or biking. Work with your health care provider or dietitian to adjust your eating plan to your individual calorie needs. What foods should I eat? Fruits All fresh, dried, or frozen fruit. Canned fruit in natural juice (without addedsugar). Vegetables Fresh or frozen vegetables (raw, steamed, roasted, or grilled). Low-sodium or reduced-sodium tomato and vegetable juice. Low-sodium or reduced-sodium tomatosauce and tomato paste. Low-sodium or reduced-sodium  canned vegetables. Grains Whole-grain or whole-wheat bread. Whole-grain or whole-wheat pasta. Brown rice. [place]. Bulgur. Whole-grain and low-sodium cereals. Pita bread.Low-fat, low-sodium crackers. Whole-wheat flour tortillas. Meats and other proteins Skinless chicken or [place]. Ground chicken or [place]. Pork with fat trimmed off. Fish and seafood. Egg whites. Dried beans, peas, or lentils. Unsalted nuts, nut butters, and seeds. Unsalted canned beans. Lean cuts of beef with fat trimmed off. Low-sodium, lean precooked or cured meat, such as sausages or meatloaves. Dairy Low-fat (1%) or fat-free (skim) milk. Reduced-fat, low-fat, or fat-free cheeses. Nonfat, low-sodium ricotta or cottage cheese. Low-fat or nonfatyogurt. Low-fat, low-sodium cheese. Fats and oils Soft margarine without trans fats. Vegetable oil. Reduced-fat, low-fat, or light mayonnaise and salad dressings (reduced-sodium). Canola, safflower, olive, avocado, soybean, andsunflower oils. Avocado. Seasonings and condiments Herbs. Spices. Seasoning mixes without salt. Other foods Unsalted popcorn and pretzels. Fat-free sweets. The items listed above may not be a complete list of foods and beverages you can eat. Contact a dietitian for more information. What foods should I avoid? Fruits Canned fruit in a light or heavy syrup. Fried fruit. Fruit in cream or buttersauce. Vegetables Creamed or fried vegetables. Vegetables in a cheese sauce. Regular canned vegetables (not low-sodium or reduced-sodium). Regular canned tomato sauce and paste (not low-sodium or reduced-sodium). Regular tomato and vegetable juice(not low-sodium or reduced-sodium). Angie Fava. Olives. Grains Baked goods made with fat, such as croissants, muffins, or some breads. Drypasta or rice meal packs. Meats and other proteins Fatty cuts of meat. Ribs. Fried meat. Berniece Salines. Bologna, salami, and other precooked or cured meats, such as sausages or meat loaves. Fat from  the back of a pig (fatback). Bratwurst. Salted nuts and seeds. Canned beans with added salt. Canned orsmoked fish. Whole eggs or egg yolks. Chicken or [place] with skin. Dairy Whole or 2% milk, cream, and half-and-half. Whole or full-fat cream cheese. Whole-fat or sweetened yogurt. Full-fat  cheese. Nondairy creamers. Whippedtoppings. Processed cheese and cheese spreads. Fats and oils Butter. Stick margarine. Lard. Shortening. Ghee. Bacon fat. Tropical oils, suchas coconut, palm kernel, or palm oil. Seasonings and condiments Onion salt, garlic salt, seasoned salt, table salt, and sea salt. Worcestershire sauce. Tartar sauce. Barbecue sauce. Teriyaki sauce. Soy sauce, including reduced-sodium. Steak sauce. Canned and packaged gravies. Fish sauce. Oyster sauce. Cocktail sauce. Store-bought horseradish. Ketchup. Mustard. Meat flavorings and tenderizers. Bouillon cubes. Hot sauces. Pre-made or packaged marinades. Pre-made or packaged taco seasonings. Relishes. Regular saladdressings. Other foods Salted popcorn and pretzels. The items listed above may not be a complete list of foods and beverages you should avoid. Contact a dietitian for more information. Where to find more information National Heart, Lung, and Blood Institute: https://wilson-eaton.com/ American Heart Association: www.heart.org Academy of Nutrition and Dietetics: www.eatright.[place]: www.kidney.org Summary The DASH eating plan is a healthy eating plan that has been shown to reduce high blood pressure (hypertension). It may also reduce your risk for type 2 diabetes, heart disease, and stroke. When on the DASH eating plan, aim to eat more fresh fruits and vegetables, whole grains, lean proteins, low-fat dairy, and heart-healthy fats. With the DASH eating plan, you should limit salt (sodium) intake to 2,300 mg a day. If you have hypertension, you may need to reduce your sodium intake to 1,500 mg a day. Work with your health  care provider or dietitian to adjust your eating plan to your individual calorie needs. This information is not intended to replace advice given to you by your health care provider. Make sure you discuss any questions you have with your healthcare provider. Document Revised: 05/13/2019 Document Reviewed: 05/13/2019 Elsevier Patient Education  2022 [employer].  Patient verbalizes understanding of instructions provided today and agrees to view in [place].  Telephone follow up appointment with care management team member scheduled for:  Wednesday January 30, 2021 at 11:30 am The patient has been provided with contact information for the care management team and has been advised to call with any health related questions or concerns.   [name], RN, BSN, [facility] RN Care Coordination- [place] [phone]: direct office [phone]: mobile

## 2020-12-28 NOTE — Patient Instructions (Signed)

## 2020-12-28 NOTE — Patient Instructions (Addendum)
Ok to try the OTC Voltaren gel as needed for topical pain treatment for the hands  Please continue all other medications as before, and refills have been done if requested.  Please have the pharmacy call with any other refills you may need.  Please keep your appointments with your specialists as you may have planned  You will be contacted regarding the referral for: Sports Medicine on the first floor (they will call you later)  Please go to the XRAY Department in the first floor for the x-ray testing  Please go to the LAB at the blood drawing area for the tests to be done  You will be contacted by phone if any changes need to be made immediately.  Otherwise, you will receive a letter about your results with an explanation, but please check with MyChart first.  Please remember to sign up for MyChart if you have not done so, as this will be important to you in the future with finding out test results, communicating by private email, and scheduling acute appointments online when needed.

## 2020-12-30 ENCOUNTER — Encounter: Payer: Self-pay | Admitting: Internal Medicine

## 2020-12-31 ENCOUNTER — Telehealth: Payer: 59

## 2021-01-01 ENCOUNTER — Encounter: Payer: Self-pay | Admitting: Internal Medicine

## 2021-01-01 NOTE — Assessment & Plan Note (Signed)
Likely OA bilateral shoulders, for bilateral xray, refer sports medicine, also check esr, crp r/o PMR

## 2021-01-01 NOTE — Assessment & Plan Note (Signed)
Uncontrolled today, pt declines change in tx for now as states BP at home < 140/90  BP Readings from Last 3 Encounters:  12/28/20 (!) 152/68  10/17/20 (!) 148/72  07/17/20 (!) 156/88

## 2021-01-01 NOTE — Assessment & Plan Note (Signed)
New worsening again after remote previous injury and surgical repair, pt would be less ideal candidate for surgury, for now for shoulder xray, and refer sports medicine for further consideration

## 2021-01-01 NOTE — Assessment & Plan Note (Signed)
C/w OA advanced, for voltaren gel prn,  to f/u any worsening symptoms or concerns

## 2021-01-02 NOTE — Progress Notes (Signed)
Subjective: 87 year old male presents the office today for evaluation diabetic foot evaluation, neuropathy, lumbosacral radiculopathy.  He states he is doing well.  Denies any claudication symptoms.  Denies any ulcerations.  He has no concerns to his feet today.   Last A1c 7.9 on 10/17/2020  MRI lumbar spine without contrast 11/25/2018 Performed at [facility] orthopedic specialist: 1.  Severe bilateral foraminal stenosis at L5-S1, left greater than right.  The left L5 nerve is compressed and neural foramen. 2.  Multilevel degenerative disc disease throughout the remainder of the lumbar spine without focal neural impingement except for far lateral disc bulge and osteophyte at L2-3-4 with a slight mass-effect upon the right L3 nerve lateral to the neural foramen.  Objective: AAO x3, NAD DP/PT pulses palpable bilaterally, CRT less than 3 seconds Sensation decreased with Semmes Weinstein monofilament mostly to the digits. Semirigid hammertoe contractures are present.  There is no areas of tenderness identified bilaterally.  MMT 5/5.  There is no ulcerations. No pain with calf compression, swelling, warmth, erythema  Assessment: 87 year old male diabetic neuropathy/lumbosacral radiculopathy  Plan: -All treatment options discussed with the patient including all alternatives, risks, complications.  -From a diabetes standpoint his feet are doing well.  Discussed the importance of daily foot inspection, glucose control. -Discussed supportive shoe gear  Trula Slade DPM

## 2021-01-09 ENCOUNTER — Other Ambulatory Visit: Payer: Self-pay

## 2021-01-09 ENCOUNTER — Encounter: Payer: Self-pay | Admitting: Family Medicine

## 2021-01-09 ENCOUNTER — Ambulatory Visit: Payer: 59 | Admitting: Pharmacist

## 2021-01-09 ENCOUNTER — Other Ambulatory Visit (HOSPITAL_COMMUNITY): Payer: Self-pay

## 2021-01-09 DIAGNOSIS — E118 Type 2 diabetes mellitus with unspecified complications: Secondary | ICD-10-CM

## 2021-01-09 DIAGNOSIS — I10 Essential (primary) hypertension: Secondary | ICD-10-CM

## 2021-01-09 MED ORDER — INSULIN GLARGINE-YFGN 100 UNIT/ML ~~LOC~~ SOPN
10.0000 [IU] | PEN_INJECTOR | Freq: Every day | SUBCUTANEOUS | 3 refills | Status: DC
  Filled 2021-01-09 – 2021-05-27 (×2): qty 9, 90d supply, fill #0
  Filled 2022-01-09: qty 9, 90d supply, fill #1

## 2021-01-09 MED ORDER — OZEMPIC (0.25 OR 0.5 MG/DOSE) 2 MG/1.5ML ~~LOC~~ SOPN
0.5000 mg | PEN_INJECTOR | SUBCUTANEOUS | 2 refills | Status: DC
  Filled 2021-01-09: qty 4.5, 84d supply, fill #0

## 2021-01-09 NOTE — Patient Instructions (Signed)
Visit Information  Phone number for Pharmacist: [phone]   Goals Addressed             This Visit's Progress    Manage My Medicine       Timeframe:  Long-Range Goal Priority:  High Start Date:      08/15/20                       Expected End Date:  06/14/21                    Follow Up Date July 2022   - call for medicine refill 2 or 3 days before it runs out - call if I am sick and can't take my medicine - keep a list of all the medicines I take; vitamins and herbals too  -Increase Ozempic to 0.5 mg weekly starting 7/24. Reduce Semglee to 10 units to avoid hypoglycemia. -Continue monitoring sugars with Freestyle Libre -Do not skip meals. In the event of low sugar (<70), treat with 4 oz of orange juice or regular soda, wait 15 min and re-check sugar. If sugar is still not > 70, repeat process until it is.   Why is this important?   These steps will help you keep on track with your medicines.   Notes:         Patient verbalizes understanding of instructions provided today and agrees to view in [place].  The pharmacy team will reach out to the patient again over the next 7 days.   Charlene Brooke, PharmD, [facility] [place], CPP Clinical Pharmacist [facility] at [facility] [phone]

## 2021-01-09 NOTE — Progress Notes (Signed)
Chronic Care Management Pharmacy Note  01/09/2021 Name:  Dale Anderson MRN:  [id number] DOB:  [birth year]  Summary: -Pt reports BG is mostly between 90-180, however he did have a low sugar of 56 last week -Pt is taking Semglee 15 units and Ozempic 0.25 mg weekly (he never increased to 0.5 mg)  Recommendations/Changes made from today's visit: -Increase Ozempic to 0.5 mg weekly and reduce Semglee to 10 units (goal to eliminate hypoglycemia)   Subjective: Dale Anderson is an 87 y.o. year old male who is a primary patient of [name], MD.  The CCM team was consulted for assistance with disease management and care coordination needs.    Engaged with patient by telephone for follow up visit in response to provider referral for pharmacy case management and/or care coordination services.   Consent to Services:  The patient was given information about Chronic Care Management services, agreed to services, and gave verbal consent prior to initiation of services.  Please see initial visit note for detailed documentation.   Patient Care Team: [name], MD as PCP - General (Internal Medicine) Belva Crome, MD as PCP - Cardiology (Cardiology) Alda Berthold, DO as Consulting Physician (Neurology) Charlton Haws, [facility] as Pharmacist (Pharmacist) Knox Royalty, RN as [place] Management  Recent office visits: 12/28/20 Dr Jenny Reichmann OV: acute visit for shoulder pain, high BP in office. Advised Voltaren gel, referred to sports med.  10/17/20 Dr Ronnald Ramp OV: DM f/u; A1c 7.9; no med changes. F/U 6 months  07/17/20 Dr Ronnald Ramp OV: chronic f/u, c/o lightheadedness. DC'd ARB d/t K 5.3. A1c up to 8.4, increased Tresiba to 30 units. Rx'd Gvoke. CT scan for recent fall negative for bleed or acute stroke.  Recent consult visits: 05/11/20 Dr Carolin Sicks (nephrology): pt recently started Eliquis, metoprolol for Afib. No med changes.  Hospital visits: None in previous 6  months  Objective:  Lab Results  Component Value Date   CREATININE 1.57 (H) 12/28/2020   BUN 25 (H) 12/28/2020   GFR 39.08 (L) 12/28/2020   GFRNONAA 56 (L) 05/19/2012   GFRAA 65 (L) 05/19/2012   NA 141 12/28/2020   K 4.2 12/28/2020   CALCIUM 9.2 12/28/2020   CO2 26 12/28/2020    Lab Results  Component Value Date/Time   HGBA1C 7.9 (H) 10/17/2020 04:00 PM   HGBA1C 8.4 (H) 07/17/2020 02:23 PM   HGBA1C 7.1 01/25/2016 12:00 AM   GFR 39.08 (L) 12/28/2020 12:08 PM   GFR 38.83 (L) 10/17/2020 04:00 PM   MICROALBUR 10.0 (H) 10/17/2020 04:00 PM   MICROALBUR 12.5 (H) 09/22/2019 10:34 AM    Last diabetic Eye exam:  Lab Results  Component Value Date/Time   HMDIABEYEEXA No Retinopathy 12/10/2018 12:00 AM    Last diabetic Foot exam:  Lab Results  Component Value Date/Time   HMDIABFOOTEX done 08/29/2013 12:00 AM     Lab Results  Component Value Date   CHOL 133 01/04/2020   HDL 44 01/04/2020   LDLCALC 72 01/04/2020   TRIG 85 01/04/2020   CHOLHDL 3.0 01/04/2020    Hepatic Function Latest Ref Rng & Units 12/28/2020 07/17/2020 12/22/2018  Total Protein 6.0 - 8.3 g/dL 7.5 7.8 7.5  Albumin 3.5 - 5.2 g/dL 4.1 4.4 4.3  AST 0 - 37 U/L _0 ALT 0 - 53 U/L _1 Alk Phosphatase 39 - 117 U/L 63 71 65  Total Bilirubin 0.2 - 1.2 mg/dL 1.2 1.1 1.0  Bilirubin, Direct 0.0 - 0.3 mg/dL 0.2 0.2 -    Lab Results  Component Value Date/Time   TSH 2.32 07/17/2020 02:23 PM   TSH 4.28 01/04/2020 09:33 AM    CBC Latest Ref Rng & Units 12/28/2020 10/17/2020 07/17/2020  WBC 4.0 - 10.5 K/uL 6.7 5.9 6.6  Hemoglobin 13.0 - 17.0 g/dL 12.4(L) 12.0(L) 13.2  Hematocrit 39.0 - 52.0 % 36.5(L) 35.4(L) 39.6  Platelets 150.0 - 400.0 K/uL 195.0 170.0 209.0    No results found for: VD25OH  Clinical ASCVD: No  The ASCVD Risk score Mikey Bussing DC Jr., et al., 2013) failed to calculate for the following reasons:   The 2013 ASCVD risk score is only valid for ages 68 to 21    Depression screen PHQ 2/9 10/17/2020  08/13/2020 07/17/2020  Decreased Interest 0 0 0  Down, Depressed, Hopeless 0 0 0  PHQ - 2 Score 0 0 0    GAD 7 : Generalized Anxiety Score 05/25/2018 11/24/2016  Nervous, Anxious, on Edge 2 0  Control/stop worrying 0 0  Worry too much - different things 1 0  Trouble relaxing 1 0  Restless 1 0  Easily annoyed or irritable 0 0  Afraid - awful might happen 0 0  Total GAD 7 Score 5 0   CHA2DS2-VASc Score = 4  The patient's score is based upon: CHF History: No HTN History: Yes Diabetes History: Yes Stroke History: No Vascular Disease History: No Age Score: 2 Gender Score: 0     Social History   Tobacco Use  Smoking Status Never  Smokeless Tobacco Never   BP Readings from Last 3 Encounters:  12/28/20 (!) 152/68  10/17/20 (!) 148/72  07/17/20 (!) 156/88   Pulse Readings from Last 3 Encounters:  12/28/20 61  10/17/20 (!) 56  07/17/20 87   Wt Readings from Last 3 Encounters:  12/28/20 160 lb (72.6 kg)  10/17/20 167 lb 9.6 oz (76 kg)  07/17/20 166 lb (75.3 kg)   BMI Readings from Last 3 Encounters:  12/28/20 24.15 kg/m  10/17/20 25.30 kg/m  07/17/20 23.15 kg/m     Assessment/Interventions: Review of patient past medical history, allergies, medications, health status, including review of consultants reports, laboratory and other test data, was performed as part of comprehensive evaluation and provision of chronic care management services.   SDOH:  (Social Determinants of Health) assessments and interventions performed: Yes   CCM Care Plan  Allergies  Allergen Reactions   Lisinopril Cough    Medications Reviewed Today     Reviewed by Biagio Borg, MD (Physician) on 01/01/21 at 2010  Med List Status: <None>   Medication Order Taking? Sig Documenting Provider Last Dose Status Informant  ALPRAZolam (XANAX) 0.5 MG tablet [id number] Yes TAKE 1 TABLET BY MOUTH DAILY AS NEEDED FOR ANXIETY [name], MD Taking Active   apixaban (ELIQUIS) 2.5 MG TABS tablet  [id number] Yes Take 1 tablet (2.5 mg total) by mouth 2 (two) times daily. [name], MD Taking Active   aspirin 81 MG chewable tablet [id number]  Chew by mouth. [provider]  Active   atorvastatin (LIPITOR) 20 MG tablet [id number] Yes TAKE 1 TABLET BY MOUTH DAILY. [name], MD Taking Active   benzonatate (TESSALON PERLES) 100 MG capsule [id number] Yes Take 1 capsule (100 mg total) by mouth 3 (three) times daily as needed. [name], DO Taking Active   blood glucose meter kit and supplies KIT [id number] Yes Use to test blood sugar  once daily. DX: E11.9 [name], MD Taking Active   Blood Glucose Monitoring Suppl ([address] [place]) DEVI [id number] Yes  [provider] Taking Active   Cholecalciferol (VITAMIN D) 1000 UNITS capsule [id number] Yes Take 1,000 Units by mouth daily. [provider] Taking Active            Med Note Thurmond Butts, Johny Drilling   Fri Jan 16, 2017 10:06 AM)    Continuous Blood Gluc Receiver (FREESTYLE LIBRE 2 READER) DEVI [id number] Yes USE AS DIRECTED [name], MD Taking Active   Continuous Blood Gluc Sensor (FREESTYLE LIBRE 2 SENSOR) [place] [id number] Yes USE AS DIRECTED [name], MD Taking Active   glucose blood (FREESTYLE LITE) test strip [id number] Yes USE TO CHECK BLOOD SUGAR ONCE DAILY [name], MD Taking Active   Insulin Glargine-yfgn (SEMGLEE, YFGN,) 100 UNIT/ML SOPN [id number] Yes Inject 10 Units into the skin daily.  Patient taking differently: Inject 15 Units into the skin daily.    Taking Active   Insulin Glargine-yfgn (SEMGLEE, YFGN,) 100 UNIT/ML SOPN [id number] Yes Inject 15 Units into the skin daily. [name], MD Taking Active   Insulin Pen Needle 31G X 6 MM MISC [id number] Yes USE TO INJECT INSULIN 5 TIMES DAILY [name], MD Taking Active   L-Methylfolate-Algae-B12-B6 3-90.314-2-35 MG CAPS [id number] Yes TAKE 1 CAPSULE BY MOUTH 2 (TWO) TIMES DAILY. [name], MD Taking Active            Med  Note Malena Catholic Aug 15, 2020  2:28 PM) Not covered by insurance ~75 out of pocket  Lancets MISC [id number] Yes Use to test blood sugar once daily. DX E11.09 [name], MD Taking Active   lisinopril (ZESTRIL) 40 MG tablet [id number]  Take 0.5 tablets by mouth daily. [provider]  Active   metFORMIN (GLUCOPHAGE) 500 MG tablet [id number]  Take 1 tablet by mouth daily. [provider]  Active   metoprolol succinate (TOPROL-XL) 25 MG 24 hr tablet [id number] Yes TAKE 1 TABLET (25 MG TOTAL) BY MOUTH DAILY. Belva Crome, MD Taking Active   Multiple Vitamins-Minerals (CENTRUM SILVER PO) [id number] Yes Take by mouth. [provider] Taking Active   omeprazole (PRILOSEC) 20 MG capsule [id number]  Take 1 tablet by mouth daily. [provider]  Active   pantoprazole (PROTONIX) 40 MG tablet [id number] Yes TAKE 1 TABLET BY MOUTH ONCE DAILY. [name], MD Taking Active   Semaglutide [facility], 0.25 OR 0.5 MG/DOSE, [place]) [id number] Yes Inject 0.25 mg into the skin once a week. Increase to 0.5 mg after 4 weeks [provider] Taking Active            Med Note Knox Royalty   Fri Dec 28, 2020 11:50 AM) Reported 12/28/20 that he is taking "1.25 mg" "every Sunday"  tamsulosin (FLOMAX) 0.4 MG CAPS capsule [id number] Yes TAKE 1 CAPSULE BY MOUTH DAILY. [name], MD Taking Active     Discontinued 07/17/20 1749 (Discontinued by provider)   terazosin (HYTRIN) 5 MG capsule [id number]  Take 1 capsule by mouth at bedtime. [provider]  Active             Patient Active Problem List   Diagnosis Date Noted   Bilateral shoulder pain 12/28/2020   Rotator cuff insufficiency of left shoulder 12/28/2020   Bilateral hand pain 12/28/2020   Encounter for general adult medical examination with abnormal findings 07/18/2020  Atrial fibrillation by electrocardiogram ([place]) 01/12/2020   Murmur, cardiac 01/04/2020   Bradycardia 01/04/2020    Internal hemorrhoid 12/08/2019   Tinea cruris 12/05/2019   Seborrheic dermatitis of scalp 05/05/2019   Diabetic polyneuropathy associated with type 2 diabetes mellitus ([place]) 03/29/2019   Seasonal allergic rhinitis due to pollen 05/24/2018   Sciatica 10/21/2017   Chronic renal disease, stage 3, moderately decreased glomerular filtration rate (GFR) between 30-59 mL/min/1.73 square meter ([place]) 07/02/2017   Routine general medical examination at a health care facility 12/26/2013   DDD (degenerative disc disease), cervical 12/26/2013   Hypertension    Hyperlipidemia with target LDL less than 100    Type II diabetes mellitus with manifestations (HCC)    GERD (gastroesophageal reflux disease)    DJD (degenerative joint disease)    Anxiety     Immunization History  Administered Date(s) Administered   Fluad Quad(high Dose 65+) 02/24/2019   Influenza Split 03/05/2011, 03/23/2012   Influenza Whole 03/23/2002, 05/16/2009   Influenza, High Dose Seasonal PF 03/23/2017, 03/27/2018   Influenza-Unspecified 04/23/2006, 03/23/2008, 04/23/2009, 03/23/2014, 03/23/2016, 03/23/2020   PFIZER(Purple Top)SARS-COV-2 Vaccination 06/15/2019, 07/06/2019, 10/12/2020   Pneumococcal Conjugate-13 12/26/2013   Pneumococcal Polysaccharide-23 03/18/2006, 05/24/2018   Pneumococcal-Unspecified 07/20/2005   Tdap 04/08/2012   Zoster, Live 08/08/2004    Conditions to be addressed/monitored:  Hypertension, Hyperlipidemia, Diabetes, Atrial Fibrillation and Chronic Kidney Disease  Care Plan : [place]  Updates made by Charlton Haws, RPH since 01/09/2021 12:00 AM     Problem: Hypertension, Diabetes, Chronic Kidney Disease   Priority: High     Long-Range Goal: Disease management   Start Date: 08/15/2020  Expected End Date: 02/12/2021  This Visit's Progress: On track  Recent Progress: Not on track  Priority: High  Note:   Current Barriers:  Unable to independently monitor therapeutic  efficacy Unable to achieve control of diabetes  Suboptimal therapeutic regimen for diabetes  Pharmacist Clinical Goal(s):  Patient will achieve adherence to monitoring guidelines and medication adherence to achieve therapeutic efficacy achieve control of diabetes as evidenced by improvement in blood sugar readings adhere to plan to optimize therapeutic regimen for diabetes as evidenced by report of adherence to recommended medication management changes through collaboration with PharmD and provider.   Interventions: 1:1 collaboration with [name], MD regarding development and update of comprehensive plan of care as evidenced by provider attestation and co-signature Inter-disciplinary care team collaboration (see longitudinal plan of care) Comprehensive medication review performed; medication list updated in electronic medical record  Hypertension / CKD Stage 3b (BP goal <140/90) -Not ideally controlled - pt is not checking BP at home and it has been above goal in office recently -Current treatment: Metoprolol succinate 25 mg daily -Medications previously tried: chlorthalidone, lisinopril, telmisartan -Current home readings: not checking -Current exercise habits: walks hospital hallways at work -Denies hypotensive/hypertensive symptoms -Educated on BP goals and benefits of medications for prevention of heart attack, stroke and kidney damage; Importance of home blood pressure monitoring; -Counseled to monitor BP at home daily, document, and provide log at future appointments -Recommended to continue current medication  Diabetes (A1c goal <7%) -Improved -pt reports BG is mostly in the green on his Freestyle Libre (80-180), but he did have a low sugar of 56 last week; pt endorses compliance with insulin and Ozempic weekly, although he never increased Ozempic above 0.25 mg -Current medications: Semglee 15 units daily (free with insurance) Ozempic 0.25 mg daily on Sundays Freestyle  [place] 2 -Medications previously tried: [place],  metformin  -Current home glucose readings fasting glucose: 90-115 Post-prandial glucose: 160 -Counseled on prevention and treatment of hypoglycemia - advised not to skip meals, rule of 15's -It is likely sugars can be controlled with GLP-1 alone and risk for hypoglycemia can be eliminated -Plan: Increase Ozempic to 0.5 mg weekly starting 7/24. Reduce Semglee to 10 units to avoid hypoglycemia.  Patient Goals/Self-Care Activities Patient will:  - take medications as prescribed focus on medication adherence by routine -check glucose via [facility], document, and provide at future appointments -check blood pressure daily, document, and provide at future appointments -Increase Ozempic to 0.5 mg weekly starting 7/24. Reduce Semglee to 10 units to avoid hypoglycemia.      Medication Assistance: None required.  Patient affirms current coverage meets needs.  Compliance/Adherence/Medication fill history: Care Gaps: Shingrix Eye exam (due 12/10/19) Foot exam (due 09/21/20)  Star-Rating Drugs: Atorvastatin - LF 10/31/20 x 90 ds   Patient's preferred pharmacy is:  [facility] [address] [place] [address] Phone: [phone] Fax: [phone]  Uses pill box? No - prefers bottles Pt endorses 100% compliance  We discussed: Current pharmacy is preferred with insurance plan and patient is satisfied with pharmacy services Patient decided to: Continue current medication management strategy  Care Plan and Follow Up Patient Decision:  Patient agrees to Care Plan and Follow-up.  Plan: Telephone follow up appointment with care management team member scheduled for:  1 month  Charlene Brooke, PharmD, [place], CPP Clinical Pharmacist [facility] at [facility] [phone]

## 2021-01-10 MED FILL — Tamsulosin HCl Cap 0.4 MG: ORAL | 90 days supply | Qty: 90 | Fill #0 | Status: AC

## 2021-01-10 MED FILL — L-Methylfolate-Algae-Vit B12-B6 Cap 3-90.314-2-35 MG: ORAL | 30 days supply | Qty: 60 | Fill #2 | Status: AC

## 2021-01-10 MED FILL — Metoprolol Succinate Tab ER 24HR 25 MG (Tartrate Equiv): ORAL | 90 days supply | Qty: 90 | Fill #0 | Status: AC

## 2021-01-11 ENCOUNTER — Other Ambulatory Visit (HOSPITAL_COMMUNITY): Payer: Self-pay

## 2021-01-16 ENCOUNTER — Other Ambulatory Visit (HOSPITAL_COMMUNITY): Payer: Self-pay

## 2021-01-16 MED FILL — Continuous Glucose System Sensor: 28 days supply | Qty: 2 | Fill #3 | Status: AC

## 2021-01-22 ENCOUNTER — Telehealth: Payer: Self-pay | Admitting: Pharmacist

## 2021-01-22 NOTE — Progress Notes (Signed)
    Chronic Care Management Pharmacy Assistant   Name: NIXEN AVALO  MRN: [id number] DOB: 04-22-1930  Pharmacist Review  A call was made to Mr. Straw to make sure he increased his Ozempic to 0.5 mg and decreased his Semglee to 10 units. Also wanted to check to see how he has been doing with checking his blood sugar. Left message for patient to call back.   [place] Pharmacist Assistant [phone]  Time spent:5

## 2021-01-23 ENCOUNTER — Ambulatory Visit: Payer: 59 | Admitting: Internal Medicine

## 2021-01-23 ENCOUNTER — Emergency Department (HOSPITAL_BASED_OUTPATIENT_CLINIC_OR_DEPARTMENT_OTHER): Payer: 59 | Admitting: Radiology

## 2021-01-23 ENCOUNTER — Other Ambulatory Visit (HOSPITAL_COMMUNITY): Payer: Self-pay

## 2021-01-23 ENCOUNTER — Encounter (HOSPITAL_BASED_OUTPATIENT_CLINIC_OR_DEPARTMENT_OTHER): Payer: Self-pay | Admitting: Emergency Medicine

## 2021-01-23 DIAGNOSIS — Z794 Long term (current) use of insulin: Secondary | ICD-10-CM | POA: Diagnosis not present

## 2021-01-23 DIAGNOSIS — X58XXXA Exposure to other specified factors, initial encounter: Secondary | ICD-10-CM | POA: Diagnosis not present

## 2021-01-23 DIAGNOSIS — M7989 Other specified soft tissue disorders: Secondary | ICD-10-CM | POA: Diagnosis not present

## 2021-01-23 DIAGNOSIS — S63392A Traumatic rupture of other ligament of left wrist, initial encounter: Secondary | ICD-10-CM | POA: Insufficient documentation

## 2021-01-23 DIAGNOSIS — I129 Hypertensive chronic kidney disease with stage 1 through stage 4 chronic kidney disease, or unspecified chronic kidney disease: Secondary | ICD-10-CM | POA: Insufficient documentation

## 2021-01-23 DIAGNOSIS — Z7982 Long term (current) use of aspirin: Secondary | ICD-10-CM | POA: Insufficient documentation

## 2021-01-23 DIAGNOSIS — S6991XA Unspecified injury of right wrist, hand and finger(s), initial encounter: Secondary | ICD-10-CM | POA: Diagnosis present

## 2021-01-23 DIAGNOSIS — S6992XA Unspecified injury of left wrist, hand and finger(s), initial encounter: Secondary | ICD-10-CM | POA: Diagnosis present

## 2021-01-23 DIAGNOSIS — Z7984 Long term (current) use of oral hypoglycemic drugs: Secondary | ICD-10-CM | POA: Diagnosis not present

## 2021-01-23 DIAGNOSIS — S63391A Traumatic rupture of other ligament of right wrist, initial encounter: Secondary | ICD-10-CM | POA: Insufficient documentation

## 2021-01-23 DIAGNOSIS — Z79899 Other long term (current) drug therapy: Secondary | ICD-10-CM | POA: Diagnosis not present

## 2021-01-23 DIAGNOSIS — Z7901 Long term (current) use of anticoagulants: Secondary | ICD-10-CM | POA: Insufficient documentation

## 2021-01-23 DIAGNOSIS — N183 Chronic kidney disease, stage 3 unspecified: Secondary | ICD-10-CM | POA: Diagnosis not present

## 2021-01-23 DIAGNOSIS — E1122 Type 2 diabetes mellitus with diabetic chronic kidney disease: Secondary | ICD-10-CM | POA: Diagnosis not present

## 2021-01-23 DIAGNOSIS — E1142 Type 2 diabetes mellitus with diabetic polyneuropathy: Secondary | ICD-10-CM | POA: Diagnosis not present

## 2021-01-23 DIAGNOSIS — M25531 Pain in right wrist: Secondary | ICD-10-CM | POA: Diagnosis not present

## 2021-01-23 NOTE — ED Triage Notes (Signed)
Pt presents to ED POV. Pt c/o R wrist pain x2w. Pt seen by PCP told to go to PT. Pt reports too painful for PT. Wrist is swollen, reddened, R radial +2. Denies fever/chills, and/or injury.

## 2021-01-24 ENCOUNTER — Encounter (HOSPITAL_BASED_OUTPATIENT_CLINIC_OR_DEPARTMENT_OTHER): Payer: Self-pay | Admitting: Emergency Medicine

## 2021-01-24 ENCOUNTER — Other Ambulatory Visit (HOSPITAL_COMMUNITY): Payer: Self-pay

## 2021-01-24 ENCOUNTER — Emergency Department (HOSPITAL_BASED_OUTPATIENT_CLINIC_OR_DEPARTMENT_OTHER)
Admission: EM | Admit: 2021-01-24 | Discharge: 2021-01-24 | Disposition: A | Discharge status: Home / Self Care | Payer: 59 | Attending: Emergency Medicine | Admitting: Emergency Medicine

## 2021-01-24 DIAGNOSIS — Z794 Long term (current) use of insulin: Secondary | ICD-10-CM | POA: Diagnosis not present

## 2021-01-24 DIAGNOSIS — Z7982 Long term (current) use of aspirin: Secondary | ICD-10-CM | POA: Diagnosis not present

## 2021-01-24 DIAGNOSIS — S6990XA Unspecified injury of unspecified wrist, hand and finger(s), initial encounter: Secondary | ICD-10-CM

## 2021-01-24 DIAGNOSIS — I129 Hypertensive chronic kidney disease with stage 1 through stage 4 chronic kidney disease, or unspecified chronic kidney disease: Secondary | ICD-10-CM | POA: Diagnosis not present

## 2021-01-24 DIAGNOSIS — E1142 Type 2 diabetes mellitus with diabetic polyneuropathy: Secondary | ICD-10-CM | POA: Diagnosis not present

## 2021-01-24 DIAGNOSIS — N183 Chronic kidney disease, stage 3 unspecified: Secondary | ICD-10-CM | POA: Diagnosis not present

## 2021-01-24 DIAGNOSIS — Z7901 Long term (current) use of anticoagulants: Secondary | ICD-10-CM | POA: Diagnosis not present

## 2021-01-24 DIAGNOSIS — Z79899 Other long term (current) drug therapy: Secondary | ICD-10-CM | POA: Diagnosis not present

## 2021-01-24 DIAGNOSIS — S63392A Traumatic rupture of other ligament of left wrist, initial encounter: Secondary | ICD-10-CM | POA: Diagnosis not present

## 2021-01-24 DIAGNOSIS — E1122 Type 2 diabetes mellitus with diabetic chronic kidney disease: Secondary | ICD-10-CM | POA: Diagnosis not present

## 2021-01-24 DIAGNOSIS — S63391A Traumatic rupture of other ligament of right wrist, initial encounter: Secondary | ICD-10-CM | POA: Diagnosis not present

## 2021-01-24 MED ORDER — ACETAMINOPHEN 500 MG PO TABS
ORAL_TABLET | ORAL | Status: AC
  Filled 2021-01-24: qty 2

## 2021-01-24 MED ORDER — NAPROXEN 250 MG PO TABS
500.0000 mg | ORAL_TABLET | Freq: Once | ORAL | Status: AC
  Administered 2021-01-24: 500 mg via ORAL
  Filled 2021-01-24: qty 2

## 2021-01-24 MED ORDER — LIDOCAINE 5 % EX PTCH
1.0000 | MEDICATED_PATCH | CUTANEOUS | Status: DC
  Administered 2021-01-24: 1 via TRANSDERMAL
  Filled 2021-01-24: qty 1

## 2021-01-24 MED ORDER — DICLOFENAC SODIUM 1 % EX GEL
4.0000 g | Freq: Four times a day (QID) | CUTANEOUS | 0 refills | Status: DC
  Filled 2021-01-24: qty 100, 10d supply, fill #0

## 2021-01-24 MED ORDER — ACETAMINOPHEN 500 MG PO TABS
1000.0000 mg | ORAL_TABLET | Freq: Once | ORAL | Status: AC
  Administered 2021-01-24: 1000 mg via ORAL

## 2021-01-24 MED ORDER — LIDOCAINE 5 % EX PTCH
1.0000 | MEDICATED_PATCH | CUTANEOUS | 0 refills | Status: DC
  Filled 2021-01-24: qty 30, 30d supply, fill #0

## 2021-01-24 NOTE — ED Provider Notes (Addendum)
[facility] EMERGENCY DEPT Provider Note   CSN: [id number] Arrival date & time: 01/23/21  2020     History Chief Complaint  Patient presents with   Wrist Pain    Dale Anderson is a 87 y.o. male.  The history is provided by the patient.  Wrist Pain This is a new problem. The current episode started more than 1 week ago (a little over 2 weeks ago). The problem occurs constantly. The problem has not changed since onset.Pertinent negatives include no chest pain, no abdominal pain, no headaches and no shortness of breath. Nothing aggravates the symptoms. Nothing relieves the symptoms. He has tried nothing for the symptoms. The treatment provided no relief.  Patient with R wrist pain and swelling for at least 2 weeks, no injury that he knows of.  Was sent by PMD to PT but unable to complete secondary to pain.  Patient has not taken any medication for pain.      Past Medical History:  Diagnosis Date   Adhesive capsulitis of right shoulder    Anxiety    Diabetes mellitus (HCC)    DJD (degenerative joint disease)    GERD (gastroesophageal reflux disease)    Hemorrhoid    History of shingles    Hyperlipidemia    Hypertension    Nodular prostate without urinary obstruction    Recurrent bronchiectasis (HCC)    Tear of right rotator cuff     Patient Active Problem List   Diagnosis Date Noted   Bilateral shoulder pain 12/28/2020   Rotator cuff insufficiency of left shoulder 12/28/2020   Bilateral hand pain 12/28/2020   Encounter for general adult medical examination with abnormal findings 07/18/2020   Atrial fibrillation by electrocardiogram (Icard) 01/12/2020   Murmur, cardiac 01/04/2020   Bradycardia 01/04/2020   Internal hemorrhoid 12/08/2019   Tinea cruris 12/05/2019   Seborrheic dermatitis of scalp 05/05/2019   Diabetic polyneuropathy associated with type 2 diabetes mellitus ([place]) 03/29/2019   Seasonal allergic rhinitis due to pollen 05/24/2018   Sciatica  10/21/2017   Chronic renal disease, stage 3, moderately decreased glomerular filtration rate (GFR) between 30-59 mL/min/1.73 square meter ([place]) 07/02/2017   Routine general medical examination at a health care facility 12/26/2013   DDD (degenerative disc disease), cervical 12/26/2013   Hypertension    Hyperlipidemia with target LDL less than 100    Type II diabetes mellitus with manifestations (HCC)    GERD (gastroesophageal reflux disease)    DJD (degenerative joint disease)    Anxiety     Past Surgical History:  Procedure Laterality Date   COLONOSCOPY  1998   HAND RECONSTRUCTION     right   INGUINAL HERNIA REPAIR  2006   Right.  Dr. Bubba Camp   ROTATOR CUFF REPAIR  1995   Left.  Dr. Wonda Olds   SHOULDER ARTHROSCOPY WITH ROTATOR CUFF REPAIR AND SUBACROMIAL DECOMPRESSION  05/25/2012   Procedure: SHOULDER ARTHROSCOPY WITH ROTATOR CUFF REPAIR AND SUBACROMIAL DECOMPRESSION;  Surgeon: Lorn Junes, MD;  Location: [facility];  Service: Orthopedics;  Laterality: Right;  Right Shoulder Arthroscopy shoulder decompression subacromial partial acromioplasty with coracoaromial release, arthroscopy shoulder distal claviculectomy, arthroscopy shoulder with rotator cuff repai       Family History  Problem Relation Age of Onset   Heart disease Father        MI at age 7   Emphysema Other        cousin   Colon cancer Neg Hx     Social  History   Tobacco Use   Smoking status: Never   Smokeless tobacco: Never  Vaping Use   Vaping Use: Never used  Substance Use Topics   Alcohol use: No   Drug use: No    Home Medications Prior to Admission medications   Medication Sig Start Date End Date Taking? Authorizing Provider  ALPRAZolam Duanne Moron) 0.5 MG tablet TAKE 1 TABLET BY MOUTH DAILY AS NEEDED FOR ANXIETY 01/23/20   [name], MD  apixaban (ELIQUIS) 2.5 MG TABS tablet Take 1 tablet (2.5 mg total) by mouth 2 (two) times daily. 11/20/20   [name], MD  aspirin 81 MG  chewable tablet Chew by mouth. 07/20/06   [provider]  atorvastatin (LIPITOR) 20 MG tablet TAKE 1 TABLET BY MOUTH DAILY. 06/18/20 06/18/21  [name], MD  benzonatate (TESSALON PERLES) 100 MG capsule Take 1 capsule (100 mg total) by mouth 3 (three) times daily as needed. 11/27/20   [name], DO  blood glucose meter kit and supplies KIT Use to test blood sugar once daily. DX: E11.9 07/24/16   [name], MD  Blood Glucose Monitoring Suppl (FREESTYLE LITE) DEVI  07/24/16   [provider]  Cholecalciferol (VITAMIN D) 1000 UNITS capsule Take 1,000 Units by mouth daily.    [provider]  Continuous Blood Gluc Receiver (FREESTYLE LIBRE 2 READER) DEVI USE AS DIRECTED 08/15/20 08/15/21  [name], MD  Continuous Blood Gluc Sensor (FREESTYLE LIBRE 2 SENSOR) MISC USE AS DIRECTED 08/15/20 08/15/21  [name], MD  glucose blood (FREESTYLE LITE) test strip USE TO CHECK BLOOD SUGAR ONCE DAILY 02/08/19   [name], MD  insulin glargine-yfgn (SEMGLEE, YFGN,) 100 UNIT/ML SOPN Inject 10 units into the skin daily. 01/09/21   [name], MD  Insulin Pen Needle 31G X 6 MM MISC USE TO INJECT INSULIN 5 TIMES DAILY 04/04/20 04/04/21  [name], MD  L-Methylfolate-Algae-B12-B6 3-90.314-2-35 MG CAPS TAKE 1 CAPSULE BY MOUTH 2 (TWO) TIMES DAILY. 06/06/20 06/06/21  [name], MD  Lancets MISC Use to test blood sugar once daily. DX E11.09 07/24/16   [name], MD  lisinopril (ZESTRIL) 40 MG tablet Take 0.5 tablets by mouth daily. 07/20/06   [provider]  metFORMIN (GLUCOPHAGE) 500 MG tablet Take 1 tablet by mouth daily. 07/20/06   [provider]  metoprolol succinate (TOPROL-XL) 25 MG 24 hr tablet TAKE 1 TABLET (25 MG TOTAL) BY MOUTH DAILY. 03/19/20 04/11/21  Belva Crome, MD  Multiple Vitamins-Minerals (CENTRUM SILVER PO) Take by mouth.    [provider]  omeprazole (PRILOSEC) 20 MG capsule Take 1 tablet by mouth daily.  07/20/06   [provider]  pantoprazole (PROTONIX) 40 MG tablet TAKE 1 TABLET BY MOUTH ONCE DAILY. 07/17/20 07/17/21  [name], MD  Semaglutide,0.25 or 0.5MG/DOS, (OZEMPIC, 0.25 OR 0.5 MG/DOSE,) 2 MG/1.5ML SOPN Inject 0.5 mg into the skin once a week. On Sundays 01/09/21   [name], MD  tamsulosin (FLOMAX) 0.4 MG CAPS capsule TAKE 1 CAPSULE BY MOUTH DAILY. 07/27/20 07/27/21  [name], MD  terazosin (HYTRIN) 5 MG capsule Take 1 capsule by mouth at bedtime. 12/01/08   [provider]  telmisartan (MICARDIS) 40 MG tablet TAKE 1 TABLET BY MOUTH DAILY. 04/04/20 07/17/20  [name], MD    Allergies    Lisinopril  Review of Systems   Review of Systems  Constitutional:  Negative for fever.  HENT:  Negative for facial swelling.   Eyes:  Negative for redness.  Respiratory:  Negative for shortness of breath.   Cardiovascular:  Negative for chest pain.  Gastrointestinal:  Negative for abdominal pain.  Musculoskeletal:  Positive for arthralgias.  Skin:  Negative for rash.  Neurological:  Negative for facial asymmetry and headaches.  Psychiatric/Behavioral:  Negative for agitation.   All other systems reviewed and are negative.  Physical Exam Updated Vital Signs BP (!) 171/83 (BP Location: Left Arm)   Pulse (!) 44   Temp 98.2 F (36.8 C) (Oral)   Resp 16   Ht _0  (1.803 m)   Wt 73.5 kg   SpO2 96%   BMI 22.59 kg/m   Physical Exam Vitals and nursing note reviewed. Exam conducted with a chaperone present.  Constitutional:      General: He is not in acute distress.    Appearance: Normal appearance.  HENT:     Head: Normocephalic and atraumatic.     Nose: Nose normal.  Eyes:     Conjunctiva/sclera: Conjunctivae normal.     Pupils: Pupils are equal, round, and reactive to light.  Cardiovascular:     Rate and Rhythm: Normal rate and regular rhythm.     Pulses: Normal pulses.     Heart sounds: Normal heart sounds.  Pulmonary:     Effort:  Pulmonary effort is normal.     Breath sounds: Normal breath sounds.  Abdominal:     General: Abdomen is flat. Bowel sounds are normal.     Palpations: Abdomen is soft.     Tenderness: There is no abdominal tenderness. There is no guarding.  Musculoskeletal:        General: Normal range of motion.     Right forearm: Normal.     Right wrist: Swelling and tenderness present. No deformity, effusion, lacerations, bony tenderness, snuff box tenderness or crepitus. Normal range of motion. Normal pulse.     Right hand: Normal.     Cervical back: Normal range of motion and neck supple.  Skin:    General: Skin is warm and dry.     Capillary Refill: Capillary refill takes less than 2 seconds.     Findings: No erythema.  Neurological:     General: No focal deficit present.     Mental Status: He is alert and oriented to person, place, and time.     Deep Tendon Reflexes: Reflexes normal.  Psychiatric:        Mood and Affect: Mood normal.        Behavior: Behavior normal.    ED Results / Procedures / Treatments   Labs (all labs ordered are listed, but only abnormal results are displayed) Labs Reviewed - No data to display  EKG None  Radiology DG Wrist Complete Right  Result Date: 01/23/2021 CLINICAL DATA:  Right wrist pain and swelling EXAM: RIGHT WRIST - COMPLETE 3+ VIEW COMPARISON:  None. FINDINGS: Mild spurring in the carpus most notably between the scaphoid and trapezium. Mild spurring at the first carpometacarpal articulation. No visible fracture or acute bony findings. Speckle vascular calcifications. Potentially mildly widened scapholunate space with scapholunate angle of 82 degrees, suspicious for possible scapholunate ligament tear. Mild soft tissue swelling dorsally and volar to the wrist. IMPRESSION: 1. Degenerative findings in the lateral carpus. 2. Exaggerated scapholunate angle potentially reflecting scapholunate ligament tear. 3. Vascular calcifications in the wrist region. 4.  Mild regional soft tissue swelling. Electronically Signed   By: Van Clines  M.D.   On: 01/23/2021 21:06    Procedures Procedures   Medications Ordered in ED Medications  lidocaine (LIDODERM) 5 % 1 patch (has no administration in time range)  naproxen (NAPROSYN) tablet 500 mg (has no administration in time range)  acetaminophen (TYLENOL) tablet 1,000 mg (has no administration in time range)    ED Course  I have reviewed the triage vital signs and the nursing notes.  Pertinent labs & imaging results that were available during my care of the patient were reviewed by me and considered in my medical decision making (see chart for details).    Scapholunate ligament injury.  Unknown if or when trauma was.  Have placed in thumb spica splint and have referred to Hand surgery.  Patient and family verbalize understanding and agree to follow up.    Dale Anderson was evaluated in Emergency Department on 01/24/2021 for the symptoms described in the history of present illness. He was evaluated in the context of the global COVID-19 pandemic, which necessitated consideration that the patient might be at risk for infection with the SARS-CoV-2 virus that causes COVID-19. Institutional protocols and algorithms that pertain to the evaluation of patients at risk for COVID-19 are in a state of rapid change based on information released by regulatory bodies including the CDC and federal and state organizations. These policies and algorithms were followed during the patient's care in the ED.  Final Clinical Impression(s) / ED Diagnoses Final diagnoses:  Scapholunate ligament injury with no instability, initial encounter     None      Return for intractable cough, coughing up blood, fevers > 100.4 unrelieved by medication, shortness of breath, intractable vomiting, chest pain, shortness of breath, weakness, numbness, changes in speech, facial asymmetry, abdominal pain, passing out, Inability to  tolerate liquids or food, cough, altered mental status or any concerns. No signs of systemic illness or infection. The patient is nontoxic-appearing on exam and vital signs are within normal limits. I have reviewed the triage vital signs and the nursing notes. Pertinent labs & imaging results that were available during my care of the patient were reviewed by me and considered in my medical decision making (see chart for details). After history, exam, and medical workup I feel the patient has been appropriately medically screened and is safe for discharge home. Pertinent diagnoses were discussed with the patient. Patient was given return precautions. Rx / DC Orders ED Discharge Orders     None          Leesha Veno, MD 01/24/21 [id number]

## 2021-01-30 ENCOUNTER — Telehealth: Payer: Self-pay | Admitting: *Deleted

## 2021-01-30 ENCOUNTER — Telehealth: Payer: 59

## 2021-01-30 NOTE — Telephone Encounter (Signed)
  Chronic Care Management   Follow Up Note   01/30/2021 Name: Dale Anderson MRN: [id number] DOB: 18-Aug-1929  Referred by: [name], MD Reason for referral : Chronic Care Management (CCM RN CM Follow up Call; DM; HTN; Unsuccessful attempt)  An unsuccessful telephone outreach was attempted today. The patient was referred to the case management team for assistance with care management and care coordination.   Follow Up Plan:  A HIPPA compliant phone message was left for the patient providing contact information and requesting a return call Will place request with scheduling care guide to contact patient to re-schedule today's missed CCM RN follow up telephone appointment  [name], RN, BSN, [place] [phone]: direct office [phone]: mobile

## 2021-01-31 ENCOUNTER — Other Ambulatory Visit (HOSPITAL_COMMUNITY): Payer: Self-pay

## 2021-01-31 DIAGNOSIS — M25531 Pain in right wrist: Secondary | ICD-10-CM | POA: Diagnosis not present

## 2021-01-31 MED ORDER — METHYLPREDNISOLONE 4 MG PO TBPK
ORAL_TABLET | ORAL | 0 refills | Status: DC
  Filled 2021-01-31: qty 21, 6d supply, fill #0

## 2021-02-01 ENCOUNTER — Telehealth: Payer: Self-pay | Admitting: *Deleted

## 2021-02-01 NOTE — Chronic Care Management (AMB) (Signed)
  Care Management   Note  02/01/2021 Name: Dale Anderson MRN: [id number] DOB: 09/05/1929  [name] is a 87 y.o. year old male who is a primary care patient of [name], MD and is actively engaged with the care management team. I reached out to [name] by phone today to assist with re-scheduling a follow up visit with the RN Case Manager  Follow up plan: Unsuccessful telephone outreach attempt made. A HIPAA compliant phone message was left for the patient providing contact information and requesting a return call.   Julian Hy, [place] Management  Direct Dial: [phone]

## 2021-02-05 NOTE — Chronic Care Management (AMB) (Signed)
Pt scheduled by [facility] for 02/07/2021

## 2021-02-07 ENCOUNTER — Ambulatory Visit (INDEPENDENT_AMBULATORY_CARE_PROVIDER_SITE_OTHER): Payer: 59 | Admitting: *Deleted

## 2021-02-07 DIAGNOSIS — E118 Type 2 diabetes mellitus with unspecified complications: Secondary | ICD-10-CM | POA: Diagnosis not present

## 2021-02-07 DIAGNOSIS — I10 Essential (primary) hypertension: Secondary | ICD-10-CM | POA: Diagnosis not present

## 2021-02-07 NOTE — Patient Instructions (Signed)
Visit Information  Dale Anderson, it was nice talking with you today.   Please read over the attached information, and keep up the great work monitoring your blood sugars and blood pressures at home   I look forward to talking to you again for an update on Friday, April 05, 2021 at 9:00 am- please be listening out for my call that day.  I will call as close to 9:00 am as possible.   If you need to cancel or re-schedule our telephone visit, please call [phone] and one of our care guides will be happy to assist you.   I look forward to hearing about your progress.   Please don't hesitate to contact me if I can be of assistance to you before our next scheduled telephone appointment.   [name], RN, BSN, [facility] RN Care Coordination- [place] [phone]: direct office [phone]: mobile   PATIENT GOALS:  Goals Addressed             This Visit's Progress    Monitor and Manage My Blood Sugar-Diabetes Type 2   On track    Timeframe:  Long-Range Goal Priority:  Medium Start Date:     11/30/20                        Expected End Date:   11/30/21                    Follow Up Date 04/05/21 at 9:00 am  Great job monitoring your blood sugars at home: keep up the great work!  The blood sugars we reviewed today are in good range Continue checking blood sugars at home 3-4 times per day; write down all blood sugars and when you are taking your blood sugars: please write down what the "fasting (before eating/ morning)" blood sugars are and what the "2 hour after-eating" blood sugars are: these values help your doctor dose your insulin to make sure it is not too much or too little Check blood sugar if I feel it is too high or too low Take the blood sugar log to all doctor visits--- call your care providers if you have questions or concerns [place] job following a more regular routine for your eating/ meal times Please make an appointment for your annual  eye/ vision evaluation If you have questions about what medications you should be taking, call Pharmacist Charlene Brooke at Dr. Ronnald Ramp office     Why is this important?   Checking your blood sugar at home helps to keep it from getting very high or very low.  Writing the results in a diary or log helps the doctor know how to care for you.  Your blood sugar log should have the time, date and the results.  Also, write down the amount of insulin or other medicine that you take.  Other information, like what you ate, exercise done and how you were feeling, will also be helpful.          Track and Manage My Blood Pressure-Hypertension   On track    Timeframe:  Long-Range Goal Priority:  Medium Start Date:       11/30/20                      Expected End Date:    11/30/21  Follow Up Date 04/05/21   The blood pressures you reported from home recently are in good range- keep up the great work checking your blood pressures at home! Continue checking blood pressure 2- 3 times per week Write blood pressure results in a log or diary: we will review these next time we talk  Continue following a heart healthy, low salt, carbohydrate modified, low sugar diet Take your recorded blood pressure readings at home to your doctor appointments: these values will help the doctor make sure you are on the right medication Continue eating a low-salt, heart healthy diet- this helps manage your diabetes and your blood pressure   Why is this important?   You won't feel high blood pressure, but it can still hurt your blood vessels.  High blood pressure can cause heart or kidney problems. It can also cause a stroke.  Making lifestyle changes like losing a little weight or eating less salt will help.  Checking your blood pressure at home and at different times of the day can help to control blood pressure.  If the doctor prescribes medicine remember to take it the way the doctor ordered.  Call the  office if you cannot afford the medicine or if there are questions about it.            Patient verbalizes understanding of instructions provided today and agrees to view in [place].  Telephone follow up appointment with care management team member scheduled for:  Friday, April 05, 2021 at 9:00 am The patient has been provided with contact information for the care management team and has been advised to call with any health related questions or concerns.   [name], RN, BSN, [facility] RN Care Coordination- [place] [phone]: direct office [phone]: mobile

## 2021-02-07 NOTE — Chronic Care Management (AMB) (Signed)
Chronic Care Management   CCM RN Visit Note  02/07/2021 Name: Dale Anderson MRN: [id number] DOB: 02/08/1930  Subjective: Dale Anderson is a 87 y.o. year old male who is a primary care patient of [name], MD. The care management team was consulted for assistance with disease management and care coordination needs.    Engaged with patient by telephone for follow up visit in response to provider referral for case management and/or care coordination services.   Consent to Services:  The patient was given information about Chronic Care Management services, agreed to services, and gave verbal consent prior to initiation of services.  Please see initial visit note for detailed documentation.  Patient agreed to services and verbal consent obtained.   Assessment: Review of patient past medical history, allergies, medications, health status, including review of consultants reports, laboratory and other test data, was performed as part of comprehensive evaluation and provision of chronic care management services.   SDOH (Social Determinants of Health) assessments and interventions performed:  SDOH Interventions    Flowsheet Row Most Recent Value  SDOH Interventions   Food Insecurity Interventions Intervention Not Indicated  Housing Interventions Intervention Not Indicated  Transportation Interventions Intervention Not Indicated       CCM Care Plan Allergies  Allergen Reactions   Lisinopril Cough   Outpatient Encounter Medications as of 02/07/2021  Medication Sig Note   insulin glargine-yfgn (SEMGLEE, YFGN,) 100 UNIT/ML SOPN Inject 10 units into the skin daily.    Semaglutide,0.25 or 0.5MG/DOS, (OZEMPIC, 0.25 OR 0.5 MG/DOSE,) 2 MG/1.5ML SOPN Inject 0.5 mg into the skin once a week. On Sundays    ALPRAZolam (XANAX) 0.5 MG tablet TAKE 1 TABLET BY MOUTH DAILY AS NEEDED FOR ANXIETY    apixaban (ELIQUIS) 2.5 MG TABS tablet Take 1 tablet (2.5 mg total) by mouth 2 (two) times  daily.    aspirin 81 MG chewable tablet Chew by mouth.    atorvastatin (LIPITOR) 20 MG tablet TAKE 1 TABLET BY MOUTH DAILY.    benzonatate (TESSALON PERLES) 100 MG capsule Take 1 capsule (100 mg total) by mouth 3 (three) times daily as needed.    blood glucose meter kit and supplies KIT Use to test blood sugar once daily. DX: E11.9    Blood Glucose Monitoring Suppl (FREESTYLE LITE) DEVI     Cholecalciferol (VITAMIN D) 1000 UNITS capsule Take 1,000 Units by mouth daily.    Continuous Blood Gluc Receiver (FREESTYLE LIBRE 2 READER) DEVI USE AS DIRECTED    Continuous Blood Gluc Sensor (FREESTYLE LIBRE 2 SENSOR) MISC USE AS DIRECTED    diclofenac Sodium (VOLTAREN) 1 % GEL Apply 4 g topically 4 (four) times daily.    glucose blood (FREESTYLE LITE) test strip USE TO CHECK BLOOD SUGAR ONCE DAILY    Insulin Pen Needle 31G X 6 MM MISC USE TO INJECT INSULIN 5 TIMES DAILY    L-Methylfolate-Algae-B12-B6 3-90.314-2-35 MG CAPS TAKE 1 CAPSULE BY MOUTH 2 (TWO) TIMES DAILY. 08/15/2020: Not covered by insurance ~75 out of pocket   Lancets MISC Use to test blood sugar once daily. DX E11.09    lidocaine (LIDODERM) 5 % Place 1 patch onto the skin daily. Remove & Discard patch within 12 hours or as directed by MD    lisinopril (ZESTRIL) 40 MG tablet Take 0.5 tablets by mouth daily.    metFORMIN (GLUCOPHAGE) 500 MG tablet Take 1 tablet by mouth daily. (Patient not taking: Reported on 02/07/2021) 02/07/2021: 02/07/21: Patient reports this medication was discontinued "a  long time ago"   methylPREDNISolone (MEDROL) 4 MG TBPK tablet take as directed in package    metoprolol succinate (TOPROL-XL) 25 MG 24 hr tablet TAKE 1 TABLET (25 MG TOTAL) BY MOUTH DAILY.    Multiple Vitamins-Minerals (CENTRUM SILVER PO) Take by mouth.    omeprazole (PRILOSEC) 20 MG capsule Take 1 tablet by mouth daily.    pantoprazole (PROTONIX) 40 MG tablet TAKE 1 TABLET BY MOUTH ONCE DAILY.    tamsulosin (FLOMAX) 0.4 MG CAPS capsule TAKE 1 CAPSULE BY  MOUTH DAILY.    terazosin (HYTRIN) 5 MG capsule Take 1 capsule by mouth at bedtime.    [DISCONTINUED] telmisartan (MICARDIS) 40 MG tablet TAKE 1 TABLET BY MOUTH DAILY.    No facility-administered encounter medications on file as of 02/07/2021.   Patient Active Problem List   Diagnosis Date Noted   Bilateral shoulder pain 12/28/2020   Rotator cuff insufficiency of left shoulder 12/28/2020   Bilateral hand pain 12/28/2020   Encounter for general adult medical examination with abnormal findings 07/18/2020   Atrial fibrillation by electrocardiogram ([facility]) 01/12/2020   Murmur, cardiac 01/04/2020   Bradycardia 01/04/2020   Internal hemorrhoid 12/08/2019   Tinea cruris 12/05/2019   Seborrheic dermatitis of scalp 05/05/2019   Diabetic polyneuropathy associated with type 2 diabetes mellitus ([facility]) 03/29/2019   Seasonal allergic rhinitis due to pollen 05/24/2018   Sciatica 10/21/2017   Chronic renal disease, stage 3, moderately decreased glomerular filtration rate (GFR) between 30-59 mL/min/1.73 square meter ([facility]) 07/02/2017   Routine general medical examination at a health care facility 12/26/2013   DDD (degenerative disc disease), cervical 12/26/2013   Hypertension    Hyperlipidemia with target LDL less than 100    Type II diabetes mellitus with manifestations (HCC)    GERD (gastroesophageal reflux disease)    DJD (degenerative joint disease)    Anxiety    Conditions to be addressed/monitored:  HTN and DMII  Care Plan : Diabetes Type 2 (Adult)  Updates made by Knox Royalty, RN since 02/07/2021 12:00 AM     Problem: Glycemic Management (Diabetes, Type 2)   Priority: High     Long-Range Goal: Glycemic Management Optimized   Start Date: 11/30/2020  Expected End Date: 11/30/2021  This Visit's Progress: On track  Recent Progress: On track  Priority: Medium  Note:   Objective:  Lab Results  Component Value Date   HGBA1C 8.4 (H) 07/17/2020   Lab Results  Component Value Date    CREATININE 1.74 (H) 07/17/2020   CREATININE 1.56 (H) 01/04/2020   CREATININE 1.51 (H) 09/22/2019   No results found for: EGFR Current Barriers:  Knowledge Deficits related to basic Diabetes pathophysiology and self care/management: lacks understanding of significance of monitoring and reaching A1-C target; needs ongoing reeducation/ reinforcement of carb modified diet Knowledge Deficits related to medications used for management of diabetes: lacks understanding of difference between long and short acting insulins Knowledge Deficits related to self administration of injectable diabetes medications- recently discovered he was not using long acting insulin correctly and needs reinforcement and follow up with pharmacist; patient had not been removing cap of long-acting insulin and was not receiving prescribed dose, he has since been unable to re-fill this medication through his outpatient pharmacy Case Manager Clinical Goal(s):  Goal re-established and extended 11/30/20 Over the next 12 months, patient will demonstrate improved adherence to prescribed treatment plan for diabetes self care/management as evidenced by:  daily monitoring and recording of CBG 3-4 times per day  adherence to  ADA/ carb modified diet  adherence to prescribed medication regimen contacting provider for new or worsened symptoms or questions Interventions:  Collaboration with [name], MD regarding development and update of comprehensive plan of care as evidenced by provider attestation and co-signature Inter-disciplinary care team collaboration (see longitudinal plan of care); direct collaboration with Charlene Brooke, Pharm D regarding medication management concerns Discussed current clinical condition with patient: he reports "doing great, feeling good" Confirmed patient continues monitoring/ recording blood sugars at home using FSL: reports consistent fasting blood sugar ranges between 90-130; this am fasting reported  as: 107; reports consistent post-prandial values between 120-150, with one isolated high reading of "189" Able to independently verbalize accurate dosing of Ozempic 0.5 weekly and Semglee Insulin 10 units QD: reports taking both as prescribed, no missed doses, confirms no longer taking metformin-- medication list updated accordingly Confirms following a more regular diet/ eating routine: confirms one isolated low blood sugar < 70- reports he did not have associated symptoms- drank juice, corrected low blood sugar immediately Encouraged patient's ongoing engagement with CCM pharmacy team: reminded patient of scheduled pharmacy call 03/07/21- patient asks that [name] contact him on his home phone number [phone]: reports ongoing difficulties with use of cell phone- will message [name] as Juluis Rainier Reviewed with patient action plan for hypoglycemia: patient verbalizes good understanding of same Confirmed patient continues working full time, tolerating well, has "good energy level, able to [place] on days off of work" Reviewed upcoming provider appointments with patient: has appointment with PCP 02/11/21- reports recently reported musculoskeletal pain "much better" Discussed plans with patient for ongoing care management follow up and confirmed patient has direct contact information for care management team Patient Goals/Self-Care Activities UNABLE to independently verbalize A1-C goal and needs reinforcement around use of and purpose of medications for DM Self administers medications as prescribed Self administers insulin as prescribed Great job monitoring your blood sugars at home: keep up the great work!  The blood sugars we reviewed today are in good range Continue checking blood sugars at home 3-4 times per day; write down all blood sugars and when you are taking your blood sugars: please write down what the "fasting (before eating/ morning)" blood sugars are and what the "2 hour after-eating" blood  sugars are: these values help your doctor dose your insulin to make sure it is not too much or too little Check blood sugar if I feel it is too high or too low Take the blood sugar log to all doctor visits--- call your care providers if you have questions or concerns [place] job following a more regular routine for your eating/ meal times Please make an appointment for your annual eye/ vision evaluation If you have questions about what medications you should be taking, call Pharmacist Charlene Brooke at Dr. Ronnald Ramp office  Follow Up Plan:  Telephone follow up appointment with care management team member scheduled for: Friday, April 05, 2021 at 9:00 am The patient has been provided with contact information for the care management team and has been advised to call with any health related questions or concerns.      Care Plan : Hypertension (Adult)  Updates made by Knox Royalty, RN since 02/07/2021 12:00 AM     Problem: Hypertension (Hypertension)   Priority: Medium     Long-Range Goal: Hypertension Monitored   Start Date: 11/30/2020  Expected End Date: 11/30/2021  This Visit's Progress: On track  Recent Progress: On track  Priority: Medium  Note:  Objective:  Last practice recorded BP readings:  BP Readings from Last 3 Encounters:  07/17/20 (!) 156/88  03/27/20 (!) 144/82  03/19/20 (!) 144/66  Most recent eGFR/CrCl: No results found for: EGFR  No components found for: CRCL Current Barriers:  Knowledge Deficits related to basic understanding of hypertension pathophysiology and self care management; patient adhering to medication and prescribed diet for HTN; could benefit from ongoing reinforcement of strategies for blood pressure management Unable to independently verbalize blood pressures at home and blood pressure target ranges Case Manager Clinical Goal(s):  Goal re-established/ extended 11/30/20: Over the next 12 months, patient will demonstrate improved adherence to prescribed  treatment plan for hypertension as evidenced by patient reporting during CCM RN CM outreach of: taking all medications as prescribed monitoring and recording blood pressure 2- 3 times per week adhering to low sodium/DASH diet Interventions:  Collaboration with [name], MD regarding development and update of comprehensive plan of care as evidenced by provider attestation and co-signature Inter-disciplinary care team collaboration (see longitudinal plan of care) Confirms patient has been monitoring blood pressures at home: reviewed these with patient today: all values stable, reported today as "139/77;" patient confirms he has had no recent low-high blood pressures Confirmed patient continues to follow heart healthy, low sodium, carb-modified, low sugar diet Confirms no recent changes to BP medications, reports adherence to all prescribed medications Reinforced previously provided education to patient re: DASH diet, complications of uncontrolled blood pressure; how diabetes affects blood pressure: patient continues to require ongoing reinforcement Self-Care Activities Self administers medications as prescribed Attends all scheduled provider appointments Calls provider office for new concerns, questions, or BP outside discussed parameters Tries to follows a low sodium diet/DASH diet Patient Goals: The blood pressures you reported from home recently are in good range- keep up the great work checking your blood pressures at home! Continue checking blood pressure 2- 3 times per week Write blood pressure results in a log or diary: we will review these next time we talk  Continue following a heart healthy, low salt, carbohydrate modified, low sugar diet Take your recorded blood pressure readings at home to your doctor appointments: these values will help the doctor make sure you are on the right medication Continue eating a low-salt, heart healthy diet- this helps manage your diabetes and your  blood pressure Follow Up Plan:  Telephone follow up appointment with care management team member scheduled for: Friday, April 05, 2021 at 9:00 am The patient has been provided with contact information for the care management team and has been advised to call with any health related questions or concerns.      Plan: Telephone follow up appointment with care management team member scheduled for:  Friday, April 05, 2021 at 9:00 am The patient has been provided with contact information for the care management team and has been advised to call with any health related questions or concerns.   [name], RN, BSN, [facility] RN Care Coordination- [place] [phone]: direct office [phone]: mobile

## 2021-02-11 ENCOUNTER — Ambulatory Visit: Payer: 59 | Admitting: Internal Medicine

## 2021-02-13 ENCOUNTER — Other Ambulatory Visit (HOSPITAL_COMMUNITY): Payer: Self-pay

## 2021-02-14 ENCOUNTER — Other Ambulatory Visit: Payer: Self-pay | Admitting: Internal Medicine

## 2021-02-14 ENCOUNTER — Other Ambulatory Visit (HOSPITAL_COMMUNITY): Payer: Self-pay

## 2021-02-14 DIAGNOSIS — E785 Hyperlipidemia, unspecified: Secondary | ICD-10-CM

## 2021-02-14 DIAGNOSIS — K219 Gastro-esophageal reflux disease without esophagitis: Secondary | ICD-10-CM

## 2021-02-14 MED ORDER — PANTOPRAZOLE SODIUM 40 MG PO TBEC
40.0000 mg | DELAYED_RELEASE_TABLET | Freq: Every day | ORAL | 1 refills | Status: DC
Start: 2021-02-14 — End: 2021-10-10
  Filled 2021-02-14: qty 90, 90d supply, fill #0
  Filled 2021-04-23: qty 90, 90d supply, fill #1

## 2021-02-14 MED ORDER — ATORVASTATIN CALCIUM 20 MG PO TABS
20.0000 mg | ORAL_TABLET | Freq: Every day | ORAL | 1 refills | Status: DC
Start: 2021-02-14 — End: 2021-10-10
  Filled 2021-02-14: qty 90, 90d supply, fill #0
  Filled 2021-07-10: qty 90, 90d supply, fill #1

## 2021-02-20 ENCOUNTER — Other Ambulatory Visit (HOSPITAL_COMMUNITY): Payer: Self-pay

## 2021-02-20 ENCOUNTER — Other Ambulatory Visit: Payer: Self-pay | Admitting: Internal Medicine

## 2021-02-20 DIAGNOSIS — E118 Type 2 diabetes mellitus with unspecified complications: Secondary | ICD-10-CM

## 2021-02-20 DIAGNOSIS — IMO0002 Reserved for concepts with insufficient information to code with codable children: Secondary | ICD-10-CM

## 2021-02-20 DIAGNOSIS — E1121 Type 2 diabetes mellitus with diabetic nephropathy: Secondary | ICD-10-CM

## 2021-02-20 MED ORDER — FREESTYLE LIBRE 2 SENSOR MISC
5 refills | Status: DC
Start: 2021-02-20 — End: 2021-11-22
  Filled 2021-02-20: qty 2, 28d supply, fill #0
  Filled 2021-04-01: qty 2, 28d supply, fill #1
  Filled 2021-04-29: qty 2, 28d supply, fill #2
  Filled 2021-06-05: qty 2, 28d supply, fill #3
  Filled 2021-07-16: qty 2, 28d supply, fill #4
  Filled 2021-09-10: qty 2, 28d supply, fill #5

## 2021-03-07 ENCOUNTER — Other Ambulatory Visit: Payer: Self-pay

## 2021-03-07 ENCOUNTER — Ambulatory Visit (INDEPENDENT_AMBULATORY_CARE_PROVIDER_SITE_OTHER): Payer: 59 | Admitting: Pharmacist

## 2021-03-07 DIAGNOSIS — I10 Essential (primary) hypertension: Secondary | ICD-10-CM

## 2021-03-07 DIAGNOSIS — E118 Type 2 diabetes mellitus with unspecified complications: Secondary | ICD-10-CM

## 2021-03-07 DIAGNOSIS — N1832 Chronic kidney disease, stage 3b: Secondary | ICD-10-CM

## 2021-03-07 NOTE — Patient Instructions (Signed)
Visit Information  Phone number for Pharmacist: [phone]   Goals Addressed             This Visit's Progress    Manage My Medicine       Timeframe:  Long-Range Goal Priority:  High Start Date:      08/15/20                       Expected End Date:  06/14/21                    Follow Up Date Dec 2022   - call for medicine refill 2 or 3 days before it runs out - call if I am sick and can't take my medicine - keep a list of all the medicines I take; vitamins and herbals too  -Continue monitoring sugars with Freestyle Libre -Do not skip meals. In the event of low sugar (<70), treat with 4 oz of orange juice or regular soda, wait 15 min and re-check sugar. If sugar is still not > 70, repeat process until it is.   Why is this important?   These steps will help you keep on track with your medicines.   Notes:         Care Plan : [place]  Updates made by Charlton Haws, RPH since 03/07/2021 12:00 AM     Problem: Hypertension, Diabetes, Chronic Kidney Disease   Priority: High     Long-Range Goal: Disease management   Start Date: 08/15/2020  Expected End Date: 02/12/2021  This Visit's Progress: On track  Recent Progress: On track  Priority: High  Note:   Current Barriers:  Unable to independently monitor therapeutic efficacy Unable to achieve control of diabetes   Pharmacist Clinical Goal(s):  Patient will achieve adherence to monitoring guidelines and medication adherence to achieve therapeutic efficacy adhere to plan to optimize therapeutic regimen for diabetes as evidenced by report of adherence to recommended medication management changes through collaboration with PharmD and provider.   Interventions: 1:1 collaboration with [name], MD regarding development and update of comprehensive plan of care as evidenced by provider attestation and co-signature Inter-disciplinary care team collaboration (see longitudinal plan of  care) Comprehensive medication review performed; medication list updated in electronic medical record  Hypertension / CKD Stage 3b (BP goal <140/90) -Controlled - pt reports home BP is normal and he is compliant with medication -Home BP: 130s/70s mostly -Current treatment: Metoprolol succinate 25 mg daily -Medications previously tried: chlorthalidone, lisinopril, telmisartan -Denies hypotensive/hypertensive symptoms -Educated on BP goals and benefits of medications for prevention of heart attack, stroke and kidney damage; Importance of home blood pressure monitoring; -Counseled to continue to monitor BP at home daily -Recommended to continue current medication  Diabetes (A1c goal <7%) -Improved -pt reports BG is mostly in the green on his Freestyle Libre (90-180) and reports fasting BG 90-140; he is compliant with medications as prescribed -Current home glucose readings: fasting glucose: 104-130 Post-prandial glucose: 150 -Current medications: Semglee 10 units daily (free with insurance) Ozempic 0.5 mg daily on Sundays Freestyle Libre 2 -Medications previously tried: Barista, metformin  -Counseled on prevention and treatment of hypoglycemia - advised not to skip meals, rule of 15's -Recommend to continue current medication  Patient Goals/Self-Care Activities Patient will:  - take medications as prescribed -focus on medication adherence by routine -check glucose via [employer], document, and provide at future appointments -check blood pressure daily, document, and provide  at future appointments      Patient verbalizes understanding of instructions provided today and agrees to view in [place].  Telephone follow up appointment with pharmacy team member scheduled for: 3 months  Charlene Brooke, PharmD, [place], CPP Clinical Pharmacist [facility] [phone]

## 2021-03-07 NOTE — Progress Notes (Signed)
Chronic Care Management Pharmacy Note  03/07/2021 Name:  Dale Anderson MRN:  [id number] DOB:  [birth year]  Summary: -Pt reports BG is mostly between 90-140. He is taking DM medications as prescribed.  Recommendations/Changes made from today's visit: -No med changes   Subjective: Dale Anderson is an 87 y.o. year old male who is a primary patient of [name], MD.  The CCM team was consulted for assistance with disease management and care coordination needs.    Engaged with patient by telephone for follow up visit in response to provider referral for pharmacy case management and/or care coordination services.   Consent to Services:  The patient was given information about Chronic Care Management services, agreed to services, and gave verbal consent prior to initiation of services.  Please see initial visit note for detailed documentation.   Patient Care Team: [name], MD as PCP - General (Internal Medicine) Belva Crome, MD as PCP - Cardiology (Cardiology) Alda Berthold, DO as Consulting Physician (Neurology) Charlton Haws, [facility] as Pharmacist (Pharmacist) Knox Royalty, RN as [place] Management  Recent office visits: 12/28/20 Dr Jenny Reichmann OV: acute visit for shoulder pain, high BP in office. Advised Voltaren gel, referred to sports med.  10/17/20 Dr Ronnald Ramp OV: DM f/u; A1c 7.9; no med changes. F/U 6 months  07/17/20 Dr Ronnald Ramp OV: chronic f/u, c/o lightheadedness. DC'd ARB d/t K 5.3. A1c up to 8.4, increased Tresiba to 30 units. Rx'd Gvoke. CT scan for recent fall negative for bleed or acute stroke.  Recent consult visits: 01/31/21 Dr Caralyn Guile (ortho surgery): f/u wrist pain.  05/11/20 Dr Carolin Sicks (nephrology): pt recently started Eliquis, metoprolol for Afib. No med changes.  Hospital visits: Medication Reconciliation was completed by comparing discharge summary, patient's EMR and Pharmacy list, and upon discussion with  patient.  Admitted to the ED on 01/24/21 due to ligament injury. Discharge date was 01/24/21. Discharged from [facility] ED.    New?Medications Started at [facility] Discharge:?? -started Lidocaine 5% patch, Voltaren gel due to pain  Medications that remain the same after Hospital Discharge:??  -All other medications will remain the same.    Objective:  Lab Results  Component Value Date   CREATININE 1.57 (H) 12/28/2020   BUN 25 (H) 12/28/2020   GFR 39.08 (L) 12/28/2020   GFRNONAA 56 (L) 05/19/2012   GFRAA 65 (L) 05/19/2012   NA 141 12/28/2020   K 4.2 12/28/2020   CALCIUM 9.2 12/28/2020   CO2 26 12/28/2020    Lab Results  Component Value Date/Time   HGBA1C 7.9 (H) 10/17/2020 04:00 PM   HGBA1C 8.4 (H) 07/17/2020 02:23 PM   HGBA1C 7.1 01/25/2016 12:00 AM   GFR 39.08 (L) 12/28/2020 12:08 PM   GFR 38.83 (L) 10/17/2020 04:00 PM   MICROALBUR 10.0 (H) 10/17/2020 04:00 PM   MICROALBUR 12.5 (H) 09/22/2019 10:34 AM    Last diabetic Eye exam:  Lab Results  Component Value Date/Time   HMDIABEYEEXA No Retinopathy 12/10/2018 12:00 AM    Last diabetic Foot exam:  Lab Results  Component Value Date/Time   HMDIABFOOTEX done 08/29/2013 12:00 AM     Lab Results  Component Value Date   CHOL 133 01/04/2020   HDL 44 01/04/2020   LDLCALC 72 01/04/2020   TRIG 85 01/04/2020   CHOLHDL 3.0 01/04/2020    Hepatic Function Latest Ref Rng & Units 12/28/2020 07/17/2020 12/22/2018  Total Protein 6.0 - 8.3 g/dL 7.5 7.8 7.5  Albumin 3.5 -  5.2 g/dL 4.1 4.4 4.3  AST 0 - 37 U/L _0 ALT 0 - 53 U/L _1 Alk Phosphatase 39 - 117 U/L 63 71 65  Total Bilirubin 0.2 - 1.2 mg/dL 1.2 1.1 1.0  Bilirubin, Direct 0.0 - 0.3 mg/dL 0.2 0.2 -    Lab Results  Component Value Date/Time   TSH 2.32 07/17/2020 02:23 PM   TSH 4.28 01/04/2020 09:33 AM    CBC Latest Ref Rng & Units 12/28/2020 10/17/2020 07/17/2020  WBC 4.0 - 10.5 K/uL 6.7 5.9 6.6  Hemoglobin 13.0 - 17.0 g/dL 12.4(L) 12.0(L) 13.2  Hematocrit  39.0 - 52.0 % 36.5(L) 35.4(L) 39.6  Platelets 150.0 - 400.0 K/uL 195.0 170.0 209.0    No results found for: VD25OH  Clinical ASCVD: No  The ASCVD Risk score (Arnett DK, et al., 2019) failed to calculate for the following reasons:   The 2019 ASCVD risk score is only valid for ages 15 to 76    Depression screen PHQ 2/9 02/07/2021 10/17/2020 08/13/2020  Decreased Interest 0 0 0  Down, Depressed, Hopeless 0 0 0  PHQ - 2 Score 0 0 0  Some recent data might be hidden    GAD 7 : Generalized Anxiety Score 05/25/2018 11/24/2016  Nervous, Anxious, on Edge 2 0  Control/stop worrying 0 0  Worry too much - different things 1 0  Trouble relaxing 1 0  Restless 1 0  Easily annoyed or irritable 0 0  Afraid - awful might happen 0 0  Total GAD 7 Score 5 0   CHA2DS2-VASc Score = 4  The patient's score is based upon: CHF History: 0 HTN History: 1 Diabetes History: 1 Stroke History: 0 Vascular Disease History: 0 Age Score: 2 Gender Score: 0     Social History   Tobacco Use  Smoking Status Never  Smokeless Tobacco Never   BP Readings from Last 3 Encounters:  01/24/21 (!) 171/83  12/28/20 (!) 152/68  10/17/20 (!) 148/72   Pulse Readings from Last 3 Encounters:  01/24/21 (!) 44  12/28/20 61  10/17/20 (!) 56   Wt Readings from Last 3 Encounters:  01/23/21 162 lb (73.5 kg)  12/28/20 160 lb (72.6 kg)  10/17/20 167 lb 9.6 oz (76 kg)   BMI Readings from Last 3 Encounters:  01/23/21 22.59 kg/m  12/28/20 24.15 kg/m  10/17/20 25.30 kg/m     Assessment/Interventions: Review of patient past medical history, allergies, medications, health status, including review of consultants reports, laboratory and other test data, was performed as part of comprehensive evaluation and provision of chronic care management services.   SDOH:  (Social Determinants of Health) assessments and interventions performed: Yes   CCM Care Plan  Allergies  Allergen Reactions   Lisinopril Cough     Medications Reviewed Today     Reviewed by Knox Royalty, RN (Registered Nurse) on 02/07/21 at [phone]  Med List Status: <None>   Medication Order Taking? Sig Documenting Provider Last Dose Status Informant  ALPRAZolam (XANAX) 0.5 MG tablet [id number]  TAKE 1 TABLET BY MOUTH DAILY AS NEEDED FOR ANXIETY [name], MD  Active   apixaban (ELIQUIS) 2.5 MG TABS tablet [id number]  Take 1 tablet (2.5 mg total) by mouth 2 (two) times daily. [name], MD  Active   aspirin 81 MG chewable tablet [id number]  Chew by mouth. [provider]  Active   atorvastatin (LIPITOR) 20 MG tablet [id number]  TAKE 1 TABLET BY MOUTH DAILY.  [name], MD  Active   benzonatate (TESSALON PERLES) 100 MG capsule [id number]  Take 1 capsule (100 mg total) by mouth 3 (three) times daily as needed. [name], DO  Active   blood glucose meter kit and supplies KIT [id number]  Use to test blood sugar once daily. DX: E11.9 [name], MD  Active   Blood Glucose Monitoring Suppl (FREESTYLE LITE) DEVI [id number]   [provider]  Active   Cholecalciferol (VITAMIN D) 1000 UNITS capsule [id number]  Take 1,000 Units by mouth daily. [provider]  Active            Med Note Thurmond Butts, Johny Drilling   Fri Jan 16, 2017 10:06 AM)    Continuous Blood Gluc Receiver (FREESTYLE LIBRE 2 READER) DEVI [id number]  USE AS DIRECTED [name], MD  Active   Continuous Blood Gluc Sensor (FREESTYLE LIBRE 2 SENSOR) MISC [id number]  USE AS DIRECTED [name], MD  Active   diclofenac Sodium (VOLTAREN) 1 % GEL [id number]  Apply 4 g topically 4 (four) times daily. Palumbo, April, MD  Active   glucose blood (FREESTYLE LITE) test strip [id number]  USE TO CHECK BLOOD SUGAR ONCE DAILY [name], MD  Active   insulin glargine-yfgn (SEMGLEE, YFGN,) 100 UNIT/ML SOPN [id number]  Inject 10 units into the skin daily. [name], MD  Active   Insulin Pen Needle 31G X 6 MM MISC [id number]  USE TO INJECT  INSULIN 5 TIMES DAILY [name], MD  Active   L-Methylfolate-Algae-B12-B6 3-90.314-2-35 MG CAPS [id number]  TAKE 1 CAPSULE BY MOUTH 2 (TWO) TIMES DAILY. [name], MD  Active            Med Note Malena Catholic Aug 15, 2020  2:28 PM) Not covered by insurance ~75 out of pocket  Lancets MISC [id number]  Use to test blood sugar once daily. DX E11.09 [name], MD  Active   lidocaine (LIDODERM) 5 % [id number]  Place 1 patch onto the skin daily. Remove & Discard patch within 12 hours or as directed by MD Palumbo, April, MD  Active   lisinopril (ZESTRIL) 40 MG tablet [id number]  Take 0.5 tablets by mouth daily. [provider]  Active   metFORMIN (GLUCOPHAGE) 500 MG tablet [id number]  Take 1 tablet by mouth daily. [provider]  Active   methylPREDNISolone (MEDROL) 4 MG TBPK tablet [id number]  take as directed in package   Active   metoprolol succinate (TOPROL-XL) 25 MG 24 hr tablet [id number]  TAKE 1 TABLET (25 MG TOTAL) BY MOUTH DAILY. Belva Crome, MD  Active   Multiple Vitamins-Minerals (CENTRUM SILVER PO) [id number]  Take by mouth. [provider]  Active   omeprazole (PRILOSEC) 20 MG capsule [id number]  Take 1 tablet by mouth daily. [provider]  Active   pantoprazole (PROTONIX) 40 MG tablet [id number]  TAKE 1 TABLET BY MOUTH ONCE DAILY. [name], MD  Active   Semaglutide,0.25 or 0.5MG/DOS, (OZEMPIC, 0.25 OR 0.5 MG/DOSE,) 2 MG/1.5ML SOPN [id number]  Inject 0.5 mg into the skin once a week. On Sundays [name], MD  Active   tamsulosin (FLOMAX) 0.4 MG CAPS capsule [id number]  TAKE 1 CAPSULE BY MOUTH DAILY. [name], MD  Active     Discontinued 07/17/20 1749 (Discontinued by provider)   terazosin (HYTRIN) 5 MG capsule [id number]  Take 1 capsule by mouth at bedtime. [provider]  Active             Patient Active Problem List   Diagnosis Date Noted   Bilateral shoulder pain 12/28/2020   Rotator cuff  insufficiency of left shoulder 12/28/2020   Bilateral hand pain 12/28/2020   Encounter for general adult medical examination with abnormal findings 07/18/2020   Atrial fibrillation by electrocardiogram ([place]) 01/12/2020   Murmur, cardiac 01/04/2020   Bradycardia 01/04/2020   Internal hemorrhoid 12/08/2019   Tinea cruris 12/05/2019   Seborrheic dermatitis of scalp 05/05/2019   Diabetic polyneuropathy associated with type 2 diabetes mellitus ([place]) 03/29/2019   Seasonal allergic rhinitis due to pollen 05/24/2018   Sciatica 10/21/2017   Chronic renal disease, stage 3, moderately decreased glomerular filtration rate (GFR) between 30-59 mL/min/1.73 square meter ([place]) 07/02/2017   Routine general medical examination at a health care facility 12/26/2013   DDD (degenerative disc disease), cervical 12/26/2013   Hypertension    Hyperlipidemia with target LDL less than 100    Type II diabetes mellitus with manifestations (HCC)    GERD (gastroesophageal reflux disease)    DJD (degenerative joint disease)    Anxiety     Immunization History  Administered Date(s) Administered   Fluad Quad(high Dose 65+) 02/24/2019   Influenza Split 03/05/2011, 03/23/2012   Influenza Whole 03/23/2002, 05/16/2009   Influenza, High Dose Seasonal PF 03/23/2017, 03/27/2018   Influenza-Unspecified 04/23/2006, 03/23/2008, 04/23/2009, 03/23/2014, 03/23/2016, 03/23/2020   PFIZER(Purple Top)SARS-COV-2 Vaccination 06/15/2019, 07/06/2019, 10/12/2020   Pneumococcal Conjugate-13 12/26/2013   Pneumococcal Polysaccharide-23 03/18/2006, 05/24/2018   Pneumococcal-Unspecified 07/20/2005   Tdap 04/08/2012   Zoster, Live 08/08/2004    Conditions to be addressed/monitored:  Hypertension, Hyperlipidemia, Diabetes, Atrial Fibrillation and Chronic Kidney Disease  Care Plan : [place]  Updates made by Charlton Haws, RPH since 03/07/2021 12:00 AM     Problem: Hypertension, Diabetes, Chronic Kidney Disease    Priority: High     Long-Range Goal: Disease management   Start Date: 08/15/2020  Expected End Date: 02/12/2021  This Visit's Progress: On track  Recent Progress: On track  Priority: High  Note:   Current Barriers:  Unable to independently monitor therapeutic efficacy Unable to achieve control of diabetes   Pharmacist Clinical Goal(s):  Patient will achieve adherence to monitoring guidelines and medication adherence to achieve therapeutic efficacy adhere to plan to optimize therapeutic regimen for diabetes as evidenced by report of adherence to recommended medication management changes through collaboration with PharmD and provider.   Interventions: 1:1 collaboration with [name], MD regarding development and update of comprehensive plan of care as evidenced by provider attestation and co-signature Inter-disciplinary care team collaboration (see longitudinal plan of care) Comprehensive medication review performed; medication list updated in electronic medical record  Hypertension / CKD Stage 3b (BP goal <140/90) -Controlled - pt reports home BP is normal and he is compliant with medication -Home BP: 130s/70s mostly -Current treatment: Metoprolol succinate 25 mg daily -Medications previously tried: chlorthalidone, lisinopril, telmisartan -Denies hypotensive/hypertensive symptoms -Educated on BP goals and benefits of medications for prevention of heart attack, stroke and kidney damage; Importance of home blood pressure monitoring; -Counseled to continue to monitor BP at home daily -Recommended to continue current medication  Diabetes (A1c goal <7%) -Improved -pt reports BG is mostly in the green on his Freestyle Libre (90-180) and reports fasting BG 90-140; he is compliant with medications as prescribed -Current home glucose readings: fasting glucose: 104-130 Post-prandial glucose: 150 -Current medications: Semglee 10 units daily (free with insurance)  Ozempic 0.5 mg  daily on Sundays Freestyle Libre 2 -Medications previously tried: Barista, metformin  -Counseled on prevention and treatment of hypoglycemia - advised not to skip meals, rule of 15's -Recommend to continue current medication  Patient Goals/Self-Care Activities Patient will:  - take medications as prescribed -focus on medication adherence by routine -check glucose via [employer], document, and provide at future appointments -check blood pressure daily, document, and provide at future appointments       Medication Assistance: None required.  Patient affirms current coverage meets needs.  Compliance/Adherence/Medication fill history: Care Gaps: Shingrix Eye exam (due 12/10/19) Foot exam (due 09/21/20)  Star-Rating Drugs: Atorvastatin - LF 10/31/20 x 90 ds   Patient's preferred pharmacy is:  [facility] [address] [place] [address] Phone: [phone] Fax: [phone]  Uses pill box? No - prefers bottles Pt endorses 100% compliance  We discussed: Current pharmacy is preferred with insurance plan and patient is satisfied with pharmacy services Patient decided to: Continue current medication management strategy  Care Plan and Follow Up Patient Decision:  Patient agrees to Care Plan and Follow-up.  Plan: Telephone follow up appointment with care management team member scheduled for:  3 months  Charlene Brooke, PharmD, [place], CPP Clinical Pharmacist [facility] [phone]

## 2021-03-08 ENCOUNTER — Other Ambulatory Visit: Payer: Self-pay | Admitting: Internal Medicine

## 2021-03-08 ENCOUNTER — Other Ambulatory Visit (HOSPITAL_COMMUNITY): Payer: Self-pay

## 2021-03-08 DIAGNOSIS — E1142 Type 2 diabetes mellitus with diabetic polyneuropathy: Secondary | ICD-10-CM

## 2021-03-08 MED ORDER — L-METHYLFOLATE-ALGAE-B12-B6 3-90.314-2-35 MG PO CAPS
1.0000 | ORAL_CAPSULE | Freq: Two times a day (BID) | ORAL | 5 refills | Status: DC
  Filled 2021-03-08: qty 60, 30d supply, fill #0
  Filled 2021-04-23: qty 60, 30d supply, fill #1
  Filled 2021-07-10: qty 60, 30d supply, fill #2
  Filled 2021-10-10: qty 60, 30d supply, fill #3
  Filled 2022-01-21: qty 60, 30d supply, fill #4

## 2021-03-11 ENCOUNTER — Other Ambulatory Visit (HOSPITAL_COMMUNITY): Payer: Self-pay

## 2021-03-11 DIAGNOSIS — M25512 Pain in left shoulder: Secondary | ICD-10-CM | POA: Diagnosis not present

## 2021-03-11 DIAGNOSIS — M25531 Pain in right wrist: Secondary | ICD-10-CM | POA: Diagnosis not present

## 2021-03-11 MED ORDER — MELOXICAM 7.5 MG PO TABS
7.5000 mg | ORAL_TABLET | Freq: Every day | ORAL | 2 refills | Status: DC
  Filled 2021-03-11: qty 30, 30d supply, fill #0

## 2021-03-18 ENCOUNTER — Encounter: Payer: Self-pay | Admitting: Internal Medicine

## 2021-03-18 ENCOUNTER — Other Ambulatory Visit: Payer: Self-pay

## 2021-03-18 ENCOUNTER — Ambulatory Visit (INDEPENDENT_AMBULATORY_CARE_PROVIDER_SITE_OTHER): Payer: 59 | Admitting: Internal Medicine

## 2021-03-18 VITALS — BP 136/66 | HR 63 | Temp 98.1°F | Resp 16 | Ht 71.0 in | Wt 162.0 lb

## 2021-03-18 DIAGNOSIS — I10 Essential (primary) hypertension: Secondary | ICD-10-CM | POA: Diagnosis not present

## 2021-03-18 DIAGNOSIS — E785 Hyperlipidemia, unspecified: Secondary | ICD-10-CM | POA: Diagnosis not present

## 2021-03-18 DIAGNOSIS — E118 Type 2 diabetes mellitus with unspecified complications: Secondary | ICD-10-CM

## 2021-03-18 DIAGNOSIS — N183 Chronic kidney disease, stage 3 unspecified: Secondary | ICD-10-CM

## 2021-03-18 NOTE — Patient Instructions (Signed)
Type 2 Diabetes Mellitus, Diagnosis, Adult ?Type 2 diabetes (type 2 diabetes mellitus) is a long-term, or chronic, disease. In type 2 diabetes, one or both of these problems may be present: ?The pancreas does not make enough of a hormone called insulin. ?Cells in the body do not respond properly to the insulin that the body makes (insulin resistance). ?Normally, insulin allows blood sugar (glucose) to enter cells in the body. The cells use glucose for energy. Insulin resistance or lack of insulin causes excess glucose to build up in the blood instead of going into cells. This causes high blood glucose (hyperglycemia).  ?What are the causes? ?The exact cause of type 2 diabetes is not known. ?What increases the risk? ?The following factors may make you more likely to develop this condition: ?Having a family member with type 2 diabetes. ?Being overweight or obese. ?Being inactive (sedentary). ?Having been diagnosed with insulin resistance. ?Having a history of prediabetes, diabetes when you were pregnant (gestational diabetes), or polycystic ovary syndrome (PCOS). ?What are the signs or symptoms? ?In the early stage of this condition, you may not have symptoms. Symptoms develop slowly and may include: ?Increased thirst or hunger. ?Increased urination. ?Unexplained weight loss. ?Tiredness (fatigue) or weakness. ?Vision changes, such as blurry vision. ?Dark patches on the skin. ?How is this diagnosed? ?This condition is diagnosed based on your symptoms, your medical history, a physical exam, and your blood glucose level. Your blood glucose may be checked with one or more of the following blood tests: ?A fasting blood glucose (FBG) test. You will not be allowed to eat (you will fast) for 8 hours or longer before a blood sample is taken. ?A random blood glucose test. This test checks blood glucose at any time of day regardless of when you ate. ?An A1C (hemoglobin A1C) blood test. This test provides information about blood  glucose levels over the previous 2-3 months. ?An oral glucose tolerance test (OGTT). This test measures your blood glucose at two times: ?After fasting. This is your baseline blood glucose level. ?Two hours after drinking a beverage that contains glucose. ?You may be diagnosed with type 2 diabetes if: ?Your fasting blood glucose level is 126 mg/dL (7.0 mmol/L) or higher. ?Your random blood glucose level is 200 mg/dL (11.1 mmol/L) or higher. ?Your A1C level is 6.5% or higher. ?Your oral glucose tolerance test result is higher than 200 mg/dL (11.1 mmol/L). ?These blood tests may be repeated to confirm your diagnosis. ?How is this treated? ?Your treatment may be managed by a specialist called an endocrinologist. Type 2 diabetes may be treated by following instructions from your health care provider about: ?Making dietary and lifestyle changes. These may include: ?Following a personalized nutrition plan that is developed by a registered dietitian. ?Exercising regularly. ?Finding ways to manage stress. ?Checking your blood glucose level as often as told. ?Taking diabetes medicines or insulin daily. This helps to keep your blood glucose levels in the healthy range. ?Taking medicines to help prevent complications from diabetes. Medicines may include: ?Aspirin. ?Medicine to lower cholesterol. ?Medicine to control blood pressure. ?Your health care provider will set treatment goals for you. Your goals will be based on your age, other medical conditions you have, and how you respond to diabetes treatment. Generally, the goal of treatment is to maintain the following blood glucose levels: ?Before meals: 80-130 mg/dL (4.4-7.2 mmol/L). ?After meals: below 180 mg/dL (10 mmol/L). ?A1C level: less than 7%. ?Follow these instructions at home: ?Questions to ask your health care provider ?  Consider asking the following questions: ?Should I meet with a certified diabetes care and education specialist? ?What diabetes medicines do I need,  and when should I take them? ?What equipment will I need to manage my diabetes at home? ?How often do I need to check my blood glucose? ?Where can I find a support group for people with diabetes? ?What number can I call if I have questions? ?When is my next appointment? ?General instructions ?Take over-the-counter and prescription medicines only as told by your health care provider. ?Keep all follow-up visits. This is important. ?Where to find more information ?For help and guidance and for more information about diabetes, please visit: ?American Diabetes Association (ADA): www.diabetes.org ?American Association of Diabetes Care and Education Specialists (ADCES): www.diabeteseducator.org ?International Diabetes Federation (IDF): www.idf.org ?Contact a health care provider if: ?Your blood glucose is at or above 240 mg/dL (13.3 mmol/L) for 2 days in a row. ?You have been sick or have had a fever for 2 days or longer, and you are not getting better. ?You have any of the following problems for more than 6 hours: ?You cannot eat or drink. ?You have nausea and vomiting. ?You have diarrhea. ?Get help right away if: ?You have severe hypoglycemia. This means your blood glucose is lower than 54 mg/dL (3.0 mmol/L). ?You become confused or you have trouble thinking clearly. ?You have difficulty breathing. ?You have moderate or large ketone levels in your urine. ?These symptoms may represent a serious problem that is an emergency. Do not wait to see if the symptoms will go away. Get medical help right away. Call your local emergency services (911 in the [place] Do not drive yourself to the hospital. ?Summary ?Type 2 diabetes mellitus is a long-term, or chronic, disease. In type 2 diabetes, the pancreas does not make enough of a hormone called insulin, or cells in the body do not respond properly to insulin that the body makes. ?This condition is treated by making dietary and lifestyle changes and taking diabetes medicines or  insulin. ?Your health care provider will set treatment goals for you. Your goals will be based on your age, other medical conditions you have, and how you respond to diabetes treatment. ?Keep all follow-up visits. This is important. ?This information is not intended to replace advice given to you by your health care provider. Make sure you discuss any questions you have with your health care provider. ?Document Revised: 09/03/2020 Document Reviewed: 09/03/2020 ?Elsevier Patient Education ? 2022 [employer]vier [employer] ? ?

## 2021-03-18 NOTE — Progress Notes (Signed)
Subjective:  Patient ID: Dale Anderson, male    DOB: Aug 13, 1929  Age: 87 y.o. MRN: [id number]  CC: Hypertension, Diabetes, and Hyperlipidemia  This visit occurred during the SARS-CoV-2 public health emergency.  Safety protocols were in place, including screening questions prior to the visit, additional usage of staff PPE, and extensive cleaning of exam room while observing appropriate contact time as indicated for disinfecting solutions.    HPI Dale Anderson presents for f/up -   He is in his usual state of health.  He denies chest pain, shortness of breath, abdominal pain, or polys.  Outpatient Medications Prior to Visit  Medication Sig Dispense Refill   ALPRAZolam (XANAX) 0.5 MG tablet TAKE 1 TABLET BY MOUTH DAILY AS NEEDED FOR ANXIETY 30 tablet 3   apixaban (ELIQUIS) 2.5 MG TABS tablet Take 1 tablet (2.5 mg total) by mouth 2 (two) times daily. 180 tablet 1   aspirin 81 MG chewable tablet Chew by mouth.     atorvastatin (LIPITOR) 20 MG tablet Take 1 tablet (20 mg total) by mouth daily. 90 tablet 1   blood glucose meter kit and supplies KIT Use to test blood sugar once daily. DX: E11.9 1 each 0   Blood Glucose Monitoring Suppl (FREESTYLE LITE) DEVI   0   Cholecalciferol (VITAMIN D) 1000 UNITS capsule Take 1,000 Units by mouth daily.     Continuous Blood Gluc Receiver (FREESTYLE LIBRE 2 READER) DEVI USE AS DIRECTED 2 each 5   Continuous Blood Gluc Sensor (FREESTYLE LIBRE 2 SENSOR) MISC USE AS DIRECTED 2 each 5   diclofenac Sodium (VOLTAREN) 1 % GEL Apply 4 g topically 4 (four) times daily. 100 g 0   glucose blood (FREESTYLE LITE) test strip USE TO CHECK BLOOD SUGAR ONCE DAILY 100 strip 1   insulin glargine-yfgn (SEMGLEE, YFGN,) 100 UNIT/ML SOPN Inject 10 units into the skin daily. 9 mL 3   Insulin Pen Needle 31G X 6 MM MISC USE TO INJECT INSULIN 5 TIMES DAILY 100 each 5   L-Methylfolate-Algae-B12-B6 3-90.314-2-35 MG CAPS Take 1 capsule by mouth 2 (two) times daily. 60 capsule  5   Lancets MISC Use to test blood sugar once daily. DX E11.09 100 each 3   lidocaine (LIDODERM) 5 % Place 1 patch onto the skin daily. Remove & Discard patch within 12 hours or as directed by MD 30 patch 0   lisinopril (ZESTRIL) 40 MG tablet Take 0.5 tablets by mouth daily.     meloxicam (MOBIC) 7.5 MG tablet Take 1 tablet (7.5 mg total) by mouth daily with meals 30 tablet 2   metFORMIN (GLUCOPHAGE) 500 MG tablet Take 1 tablet by mouth daily.     metoprolol succinate (TOPROL-XL) 25 MG 24 hr tablet TAKE 1 TABLET (25 MG TOTAL) BY MOUTH DAILY. 90 tablet 3   Multiple Vitamins-Minerals (CENTRUM SILVER PO) Take by mouth.     pantoprazole (PROTONIX) 40 MG tablet Take 1 tablet (40 mg total) by mouth daily. 90 tablet 1   Semaglutide,0.25 or 0.5MG/DOS, (OZEMPIC, 0.25 OR 0.5 MG/DOSE,) 2 MG/1.5ML SOPN Inject 0.5 mg into the skin once a week. On Sundays 1.5 mL 2   tamsulosin (FLOMAX) 0.4 MG CAPS capsule TAKE 1 CAPSULE BY MOUTH DAILY. 90 capsule 1   No facility-administered medications prior to visit.    ROS Review of Systems  Constitutional:  Negative for diaphoresis and fatigue.  HENT: Negative.    Eyes: Negative.   Respiratory:  Negative for cough, chest tightness and shortness  of breath.   Cardiovascular:  Negative for chest pain, palpitations and leg swelling.  Gastrointestinal:  Negative for abdominal pain and diarrhea.  Endocrine: Negative.   Genitourinary: Negative.  Negative for difficulty urinating and dysuria.  Musculoskeletal: Negative.   Skin: Negative.  Negative for color change.  Neurological:  Negative for dizziness, weakness, light-headedness and headaches.  Hematological:  Negative for adenopathy. Does not bruise/bleed easily.  Psychiatric/Behavioral: Negative.     Objective:  BP 136/66 (BP Location: Left Arm, Patient Position: Sitting, Cuff Size: Large)   Pulse 63   Temp 98.1 F (36.7 C) (Oral)   Resp 16   Ht '5\' 11"'  (1.803 m)   Wt 162 lb (73.5 kg)   SpO2 98%   BMI 22.59  kg/m   BP Readings from Last 3 Encounters:  03/18/21 136/66  01/24/21 (!) 171/83  12/28/20 (!) 152/68    Wt Readings from Last 3 Encounters:  03/18/21 162 lb (73.5 kg)  01/23/21 162 lb (73.5 kg)  12/28/20 160 lb (72.6 kg)    Physical Exam Vitals reviewed.  HENT:     Nose: Nose normal.     Mouth/Throat:     Mouth: Mucous membranes are moist.  Eyes:     General: No scleral icterus.    Conjunctiva/sclera: Conjunctivae normal.  Cardiovascular:     Rate and Rhythm: Normal rate and regular rhythm.     Heart sounds: No murmur heard. Pulmonary:     Effort: Pulmonary effort is normal.     Breath sounds: No stridor. No wheezing, rhonchi or rales.  Abdominal:     General: Abdomen is flat.     Palpations: There is no mass.     Tenderness: There is no abdominal tenderness. There is no guarding.     Hernia: No hernia is present.  Musculoskeletal:        General: Normal range of motion.     Cervical back: Neck supple.     Right lower leg: No edema.     Left lower leg: No edema.  Lymphadenopathy:     Cervical: No cervical adenopathy.  Skin:    General: Skin is warm and dry.  Neurological:     General: No focal deficit present.     Mental Status: He is alert.  Psychiatric:        Mood and Affect: Mood normal.        Behavior: Behavior normal.    Lab Results  Component Value Date   WBC 7.4 03/18/2021   HGB 12.1 (L) 03/18/2021   HCT 35.3 (L) 03/18/2021   PLT 208.0 03/18/2021   GLUCOSE 118 (H) 03/18/2021   CHOL 127 03/18/2021   TRIG 188.0 (H) 03/18/2021   HDL 45.10 03/18/2021   LDLCALC 45 03/18/2021   ALT 16 12/28/2020   AST 19 12/28/2020   NA 137 03/18/2021   K 4.5 03/18/2021   CL 105 03/18/2021   CREATININE 1.72 (H) 03/18/2021   BUN 32 (H) 03/18/2021   CO2 27 03/18/2021   TSH 2.32 07/17/2020   PSA 2.55 08/21/2015   HGBA1C 7.1 (H) 03/18/2021   MICROALBUR 10.0 (H) 10/17/2020    No results found.  Assessment & Plan:   Dale Anderson was seen today for  hypertension, diabetes and hyperlipidemia.  Diagnoses and all orders for this visit:  Primary hypertension- His blood pressure is adequately well controlled. -     CBC with Differential/Platelet; Future -     Basic metabolic panel; Future -  Basic metabolic panel -     CBC with Differential/Platelet  Type II diabetes mellitus with manifestations ([place])- His blood sugar is adequately well controlled. -     Basic metabolic panel; Future -     Hemoglobin A1c; Future -     Hemoglobin A1c -     Basic metabolic panel  Stage 3 chronic kidney disease, unspecified whether stage 3a or 3b CKD ([place])- His renal function has declined very slightly.  He will avoid nephrotoxic agents. -     Basic metabolic panel; Future -     Basic metabolic panel  Hyperlipidemia with target LDL less than 100- LDL goal achieved. Doing well on the statin  -     Lipid panel; Future -     Lipid panel  I am having Dale Anderson maintain his Vitamin D, blood glucose meter kit and supplies, Lancets, FreeStyle Lite, FREESTYLE LITE, Multiple Vitamins-Minerals (CENTRUM SILVER PO), ALPRAZolam, FreeStyle Libre 2 Reader, tamsulosin, Insulin Pen Needle, metoprolol succinate, apixaban, aspirin, lisinopril, metFORMIN, Ozempic (0.25 or 0.5 MG/DOSE), insulin glargine-yfgn, lidocaine, diclofenac Sodium, pantoprazole, atorvastatin, FreeStyle Libre 2 Sensor, L-Methylfolate-Algae-B12-B6, and meloxicam.  No orders of the defined types were placed in this encounter.    Follow-up: Return in about 6 months (around 09/15/2021).  Scarlette Calico, MD

## 2021-03-19 LAB — BASIC METABOLIC PANEL
BUN: 32 mg/dL — ABNORMAL HIGH (ref 6–23)
CO2: 27 mEq/L (ref 19–32)
Calcium: 9.1 mg/dL (ref 8.4–10.5)
Chloride: 105 mEq/L (ref 96–112)
Creatinine, Ser: 1.72 mg/dL — ABNORMAL HIGH (ref 0.40–1.50)
GFR: 34.97 mL/min — ABNORMAL LOW (ref >60.00)
Glucose, Bld: 118 mg/dL — ABNORMAL HIGH (ref 70–99)
Potassium: 4.5 mEq/L (ref 3.5–5.1)
Sodium: 137 mEq/L (ref 135–145)

## 2021-03-19 LAB — CBC WITH DIFFERENTIAL/PLATELET
Basophils Absolute: 0 10*3/uL (ref 0.0–0.1)
Basophils Relative: 0.5 % (ref 0.0–3.0)
Eosinophils Absolute: 0.1 10*3/uL (ref 0.0–0.7)
Eosinophils Relative: 1.8 % (ref 0.0–5.0)
HCT: 35.3 % — ABNORMAL LOW (ref 39.0–52.0)
Hemoglobin: 12.1 g/dL — ABNORMAL LOW (ref 13.0–17.0)
Lymphocytes Relative: 18.2 % (ref 12.0–46.0)
Lymphs Abs: 1.4 10*3/uL (ref 0.7–4.0)
MCHC: 34.3 g/dL (ref 30.0–36.0)
MCV: 93.1 fl (ref 78.0–100.0)
Monocytes Absolute: 0.6 10*3/uL (ref 0.1–1.0)
Monocytes Relative: 8.3 % (ref 3.0–12.0)
Neutro Abs: 5.3 10*3/uL (ref 1.4–7.7)
Neutrophils Relative %: 71.2 % (ref 43.0–77.0)
Platelets: 208 10*3/uL (ref 150.0–400.0)
RBC: 3.79 Mil/uL — ABNORMAL LOW (ref 4.22–5.81)
RDW: 13.9 % (ref 11.5–15.5)
WBC: 7.4 10*3/uL (ref 4.0–10.5)

## 2021-03-19 LAB — LIPID PANEL
Cholesterol: 127 mg/dL (ref 0–200)
HDL: 45.1 mg/dL (ref >39.00)
LDL Cholesterol: 45 mg/dL (ref 0–99)
NonHDL: 82.26
Total CHOL/HDL Ratio: 3
Triglycerides: 188 mg/dL — ABNORMAL HIGH (ref 0.0–149.0)
VLDL: 37.6 mg/dL (ref 0.0–40.0)

## 2021-03-19 LAB — HEMOGLOBIN A1C: Hgb A1c MFr Bld: 7.1 % — ABNORMAL HIGH (ref 4.6–6.5)

## 2021-03-22 DIAGNOSIS — E118 Type 2 diabetes mellitus with unspecified complications: Secondary | ICD-10-CM

## 2021-03-22 DIAGNOSIS — I10 Essential (primary) hypertension: Secondary | ICD-10-CM | POA: Diagnosis not present

## 2021-04-01 ENCOUNTER — Other Ambulatory Visit (HOSPITAL_COMMUNITY): Payer: Self-pay

## 2021-04-05 ENCOUNTER — Ambulatory Visit (INDEPENDENT_AMBULATORY_CARE_PROVIDER_SITE_OTHER): Payer: 59 | Admitting: *Deleted

## 2021-04-05 DIAGNOSIS — E118 Type 2 diabetes mellitus with unspecified complications: Secondary | ICD-10-CM

## 2021-04-05 DIAGNOSIS — I10 Essential (primary) hypertension: Secondary | ICD-10-CM

## 2021-04-05 NOTE — Chronic Care Management (AMB) (Signed)
Chronic Care Management   CCM RN Visit Note  04/05/2021 Name: Dale Anderson MRN: [id number] DOB: 1930-04-10  Subjective: Dale Anderson is a 87 y.o. year old male who is a primary care patient of [name], MD. The care management team was consulted for assistance with disease management and care coordination needs.    Engaged with patient by telephone for follow up visit in response to provider referral for case management and/or care coordination services.   Consent to Services:  The patient was given information about Chronic Care Management services, agreed to services, and gave verbal consent prior to initiation of services.  Please see initial visit note for detailed documentation.  Patient agreed to services and verbal consent obtained.   Assessment: Review of patient past medical history, allergies, medications, health status, including review of consultants reports, laboratory and other test data, was performed as part of comprehensive evaluation and provision of chronic care management services.  CCM Care Plan Allergies  Allergen Reactions   Lisinopril Cough   Outpatient Encounter Medications as of 04/05/2021  Medication Sig Note   ALPRAZolam (XANAX) 0.5 MG tablet TAKE 1 TABLET BY MOUTH DAILY AS NEEDED FOR ANXIETY    apixaban (ELIQUIS) 2.5 MG TABS tablet Take 1 tablet (2.5 mg total) by mouth 2 (two) times daily.    aspirin 81 MG chewable tablet Chew by mouth.    atorvastatin (LIPITOR) 20 MG tablet Take 1 tablet (20 mg total) by mouth daily.    blood glucose meter kit and supplies KIT Use to test blood sugar once daily. DX: E11.9    Blood Glucose Monitoring Suppl (FREESTYLE LITE) DEVI     Cholecalciferol (VITAMIN D) 1000 UNITS capsule Take 1,000 Units by mouth daily.    Continuous Blood Gluc Receiver (FREESTYLE LIBRE 2 READER) DEVI USE AS DIRECTED    Continuous Blood Gluc Sensor (FREESTYLE LIBRE 2 SENSOR) MISC USE AS DIRECTED    diclofenac Sodium (VOLTAREN) 1  % GEL Apply 4 g topically 4 (four) times daily.    glucose blood (FREESTYLE LITE) test strip USE TO CHECK BLOOD SUGAR ONCE DAILY    insulin glargine-yfgn (SEMGLEE, YFGN,) 100 UNIT/ML SOPN Inject 10 units into the skin daily.    L-Methylfolate-Algae-B12-B6 3-90.314-2-35 MG CAPS Take 1 capsule by mouth 2 (two) times daily.    Lancets MISC Use to test blood sugar once daily. DX E11.09    lidocaine (LIDODERM) 5 % Place 1 patch onto the skin daily. Remove & Discard patch within 12 hours or as directed by MD    lisinopril (ZESTRIL) 40 MG tablet Take 0.5 tablets by mouth daily.    meloxicam (MOBIC) 7.5 MG tablet Take 1 tablet (7.5 mg total) by mouth daily with meals    metFORMIN (GLUCOPHAGE) 500 MG tablet Take 1 tablet by mouth daily. 02/07/2021: 02/07/21: Patient reports this medication was discontinued "a long time ago"   metoprolol succinate (TOPROL-XL) 25 MG 24 hr tablet TAKE 1 TABLET (25 MG TOTAL) BY MOUTH DAILY.    Multiple Vitamins-Minerals (CENTRUM SILVER PO) Take by mouth.    pantoprazole (PROTONIX) 40 MG tablet Take 1 tablet (40 mg total) by mouth daily.    Semaglutide,0.25 or 0.5MG/DOS, (OZEMPIC, 0.25 OR 0.5 MG/DOSE,) 2 MG/1.5ML SOPN Inject 0.5 mg into the skin once a week. On Sundays    tamsulosin (FLOMAX) 0.4 MG CAPS capsule TAKE 1 CAPSULE BY MOUTH DAILY.    [DISCONTINUED] telmisartan (MICARDIS) 40 MG tablet TAKE 1 TABLET BY MOUTH DAILY.  No facility-administered encounter medications on file as of 04/05/2021.   Patient Active Problem List   Diagnosis Date Noted   Rotator cuff insufficiency of left shoulder 12/28/2020   Encounter for general adult medical examination with abnormal findings 07/18/2020   Atrial fibrillation by electrocardiogram ([place]) 01/12/2020   Murmur, cardiac 01/04/2020   Bradycardia 01/04/2020   Internal hemorrhoid 12/08/2019   Tinea cruris 12/05/2019   Seborrheic dermatitis of scalp 05/05/2019   Diabetic polyneuropathy associated with type 2 diabetes mellitus  ([place]) 03/29/2019   Seasonal allergic rhinitis due to pollen 05/24/2018   Sciatica 10/21/2017   Chronic renal disease, stage 3, moderately decreased glomerular filtration rate (GFR) between 30-59 mL/min/1.73 square meter ([place]) 07/02/2017   Routine general medical examination at a health care facility 12/26/2013   DDD (degenerative disc disease), cervical 12/26/2013   Hypertension    Hyperlipidemia with target LDL less than 100    Type II diabetes mellitus with manifestations ([facility])    GERD (gastroesophageal reflux disease)    DJD (degenerative joint disease)    Anxiety    Conditions to be addressed/monitored:  HTN and DMII  Care Plan : Diabetes Type 2 (Adult)  Updates made by Knox Royalty, RN since 04/05/2021 12:00 AM     Problem: Glycemic Management (Diabetes, Type 2)   Priority: High     Long-Range Goal: Glycemic Management Optimized Completed 04/05/2021  Start Date: 11/30/2020  Expected End Date: 11/30/2021  This Visit's Progress: On track  Recent Progress: On track  Priority: Medium  Note:   Objective:  Lab Results  Component Value Date   HGBA1C 8.4 (H) 07/17/2020   Lab Results  Component Value Date   CREATININE 1.74 (H) 07/17/2020   CREATININE 1.56 (H) 01/04/2020   CREATININE 1.51 (H) 09/22/2019   No results found for: EGFR Current Barriers:  Knowledge Deficits related to basic Diabetes pathophysiology and self care/management: lacks understanding of significance of monitoring and reaching A1-C target; needs ongoing reeducation/ reinforcement of carb modified diet Knowledge Deficits related to medications used for management of diabetes: lacks understanding of difference between long and short acting insulins Knowledge Deficits related to self administration of injectable diabetes medications- recently discovered he was not using long acting insulin correctly and needs reinforcement and follow up with pharmacist; patient had not been removing cap of long-acting  insulin and was not receiving prescribed dose, he has since been unable to re-fill this medication through his outpatient pharmacy Case Manager Clinical Goal(s):  Goal re-established and extended 11/30/20 CCM RN CM Plan of Care converted to new format 04/05/21- please see updated plan of care Over the next 12 months, patient will demonstrate improved adherence to prescribed treatment plan for diabetes self care/management as evidenced by:  daily monitoring and recording of CBG 3-4 times per day  adherence to ADA/ carb modified diet  adherence to prescribed medication regimen contacting provider for new or worsened symptoms or questions    Care Plan : Hypertension (Adult)  Updates made by Knox Royalty, RN since 04/05/2021 12:00 AM     Problem: Hypertension (Hypertension)   Priority: Medium     Long-Range Goal: Hypertension Monitored Completed 04/05/2021  Start Date: 11/30/2020  Expected End Date: 11/30/2021  This Visit's Progress: On track  Recent Progress: On track  Priority: Medium  Note:   Objective:  Last practice recorded BP readings:  BP Readings from Last 3 Encounters:  07/17/20 (!) 156/88  03/27/20 (!) 144/82  03/19/20 (!) 144/66  Most recent eGFR/CrCl: No  results found for: EGFR  No components found for: CRCL Current Barriers:  Knowledge Deficits related to basic understanding of hypertension pathophysiology and self care management; patient adhering to medication and prescribed diet for HTN; could benefit from ongoing reinforcement of strategies for blood pressure management Unable to independently verbalize blood pressures at home and blood pressure target ranges Case Manager Clinical Goal(s):  Goal re-established/ extended 11/30/20: CCM RN CM Plan of Care converted to new format 04/05/21- please see updated plan of care Over the next 12 months, patient will demonstrate improved adherence to prescribed treatment plan for hypertension as evidenced by patient reporting  during CCM RN CM outreach of: taking all medications as prescribed monitoring and recording blood pressure 2- 3 times per week adhering to low sodium/DASH diet    Care Plan : [place] of Care  Updates made by Knox Royalty, RN since 04/05/2021 12:00 AM     Problem: Chronic Disease Management Needs   Priority: Medium     Long-Range Goal: Development of Plan of Care for long term chronic disease management   Start Date: 11/30/2020  Expected End Date: 11/30/2021  Priority: Medium  Note:   Current Barriers:  Chronic Disease Management support and education needs related to HTN and DMII  RNCM Clinical Goal(s):  Patient will demonstrate ongoing health management independence HTN; DMII  through collaboration with RN Care manager, provider, and care team.   Interventions: 1:1 collaboration with primary care provider regarding development and update of comprehensive plan of care as evidenced by provider attestation and co-signature Inter-disciplinary care team collaboration (see longitudinal plan of care) Evaluation of current treatment plan related to  self management and patient's adherence to plan as established by provider RN CM plan of care converted to new format 04/05/21  Diabetes:  (Status: Goal on track: YES.) Lab Results  Component Value Date   HGBA1C 7.1 (H) 03/18/2021  Assessed patient's understanding of A1c goal: <7% Provided education to patient about basic DM disease process; Reviewed prescribed diet with patient low sugar, carb-modified, heart healthy, low salt, low cholesterol; Counseled on importance of regular laboratory monitoring as prescribed;        Discussed plans with patient for ongoing care management follow up and provided patient with direct contact information for care management team;      Reviewed scheduled/upcoming provider appointments including: 06/03/21- CCM pharmacy telephone visit;         Review of patient status, including review of  consultants reports, relevant laboratory and other test results, and medications completed;       Reviewed recent PCP office visit with patient: discussed decrease in A1-C: positive reinforcement provided; historical trends reviewed with patient, previously provided education around correlation of A1-C values to blood sugars at home reinforced Confirmed no recent changes to medications: patient denies question, concerns around medications, continues to independently self-manage Reviewed recent blood sugars at home: he reports fasting blood sugar this morning of "89;" reports consistent blood sugars at home, both fasting and Post-prandial between 80-130; confirmed no signs/ symptoms low blood sugars Confirmed continues using CGM: FSL, monitors blood sugars frequently throughout each day  Hypertension: (Status: Goal on track: YES.) Last practice recorded BP readings:  BP Readings from Last 3 Encounters:  03/18/21 136/66  01/24/21 (!) 171/83  12/28/20 (!) 152/68  Most recent eGFR/CrCl: No results found for: EGFR  No components found for: CRCL  Evaluation of current treatment plan related to hypertension self management and patient's adherence to plan as  established by provider;   Counseled on the importance of exercise goals with target of 150 minutes per week Discussed complications of poorly controlled blood pressure such as heart disease, stroke, circulatory complications, vision complications, kidney impairment, sexual dysfunction;  HLD labs reviewed- provided verbal and printed educational material around cholesterol content in foods; encouraged patient to continue following heart healthy/ low sodium diet and to avoid over-consumption of foods high in cholesterol Blood pressures at home reviewed: he has not been consistently recording; no specific values to share today, states, "they are all running fine, no problems;" reviewed BP at PCP Office visit: 136/66 Confirmed patient obtained flu  vaccine at work for 2022-23 flu season Reviewed weight trends at home/ provider visits: consistently 162 lbs Confirmed patient following prescribed diet, staying active, continues to work full-time  Patient Goals/Self-Care Activities: Patient will self administer medications as prescribed as evidenced by self report during CCM RN CM outreach  Patient will attend all scheduled provider appointments as evidenced by clinician review of documented attendance to scheduled appointments and patient report Patient will call pharmacy for medication refills as evidenced by patient report and review of pharmacy fill history as appropriate Patient will call provider office for new concerns or questions as evidenced by review of documented incoming telephone call notes and patient report Patient will continue to follow heart healthy, low salt, low-cholesterol, carbohydrate modified, low sugar diet, as evidenced by patient reporting during CCM RN CM follow up outreach Patient will continue to monitor and record blood sugars at home daily, as evidenced by review of same/ patient reporting during CCM RN CM follow up outreach Patient will continue to monitor and record blood pressures at home regularly, as evidenced by review of same/ patient reporting during CCM RN CM follow up outreach Patient will continue to stay active, as evidenced by patient reporting during CCM RN CM outreach              Plan: Telephone follow up appointment with care management team member scheduled for:  Tuesday, June 25, 2021 at 9:00 am The patient has been provided with contact information for the care management team and has been advised to call with any health related questions or concerns  [name], RN, BSN, [facility] RN Care Coordination- [place] [phone]: direct office [phone]: mobile

## 2021-04-05 NOTE — Patient Instructions (Addendum)
Visit Information  [name], it was nice talking with you today.    I look forward to talking to you again for an update on Tuesday, June 25, 2021 at 9:00 am- please be listening out for my call that day.  I will call as close to 9:00 am as possible.   If you need to cancel or re-schedule our telephone visit, please call [phone] and one of our care guides will be happy to assist you.   I look forward to hearing about your progress.   Please don't hesitate to contact me if I can be of assistance to you before our next scheduled telephone appointment.   [name], RN, BSN, [facility] RN Care Coordination- [place] [phone]: direct office [phone]: mobile   PATIENT GOALS:  Goals Addressed             This Visit's Progress    COMPLETED: Monitor and Manage My Blood Sugar-Diabetes Type 2       Timeframe:  Long-Range Goal Priority:  Medium Start Date:     11/30/20                        Expected End Date:   11/30/21                    Follow Up Date 04/05/21 at 9:00 am  Great job monitoring your blood sugars at home: keep up the great work!  The blood sugars we reviewed today are in good range Continue checking blood sugars at home 3-4 times per day; write down all blood sugars and when you are taking your blood sugars: please write down what the "fasting (before eating/ morning)" blood sugars are and what the "2 hour after-eating" blood sugars are: these values help your doctor dose your insulin to make sure it is not too much or too little Check blood sugar if I feel it is too high or too low Take the blood sugar log to all doctor visits--- call your care providers if you have questions or concerns [place] job following a more regular routine for your eating/ meal times Please make an appointment for your annual eye/ vision evaluation If you have questions about what medications you should be taking, call Pharmacist Charlene Brooke at  Dr. Ronnald Ramp office     Why is this important?   Checking your blood sugar at home helps to keep it from getting very high or very low.  Writing the results in a diary or log helps the doctor know how to care for you.  Your blood sugar log should have the time, date and the results.  Also, write down the amount of insulin or other medicine that you take.  Other information, like what you ate, exercise done and how you were feeling, will also be helpful.          Patient self-Care Activities   On track    Timeframe:  Long-Range Goal Priority:  Medium Start Date:       11/30/20                      Expected End Date:         11/30/21              Patient will self administer medications as prescribed   Patient will attend all scheduled provider appointments  Patient will call pharmacy  for medication refills  Patient will call provider office for new concerns or questions  Patient will continue to follow heart healthy, low salt, low-cholesterol, carbohydrate modified, low sugar diet Patient will continue to monitor and record blood sugars at home daily Patient will continue to monitor and record blood pressures at home regularly Patient will continue to stay active      COMPLETED: Track and Manage My Blood Pressure-Hypertension       Timeframe:  Long-Range Goal Priority:  Medium Start Date:       11/30/20                      Expected End Date:    11/30/21                   Follow Up Date 04/05/21   The blood pressures you reported from home recently are in good range- keep up the great work checking your blood pressures at home! Continue checking blood pressure 2- 3 times per week Write blood pressure results in a log or diary: we will review these next time we talk  Continue following a heart healthy, low salt, carbohydrate modified, low sugar diet Take your recorded blood pressure readings at home to your doctor appointments: these values will help the doctor make sure you are on the  right medication Continue eating a low-salt, heart healthy diet- this helps manage your diabetes and your blood pressure   Why is this important?   You won't feel high blood pressure, but it can still hurt your blood vessels.  High blood pressure can cause heart or kidney problems. It can also cause a stroke.  Making lifestyle changes like losing a little weight or eating less salt will help.  Checking your blood pressure at home and at different times of the day can help to control blood pressure.  If the doctor prescribes medicine remember to take it the way the doctor ordered.  Call the office if you cannot afford the medicine or if there are questions about it.            Cholesterol Content in Foods Cholesterol is a waxy, fat-like substance that helps to carry fat in the blood. The body needs cholesterol in small amounts, but too much cholesterol can cause damage to the arteries and heart. Most people should eat less than 200 milligrams (mg) of cholesterol a day. Foods with cholesterol Cholesterol is found in animal-based foods, such as meat, seafood, and dairy. Generally, low-fat dairy and lean meats have less cholesterol than full-fat dairy and fatty meats. The milligrams of cholesterol per serving (mg per serving) of common cholesterol-containing foods are listed below. Meat and other proteins Egg -- one large whole egg has 186 mg. Veal shank -- 4 oz has 141 mg. Lean ground [place] (93% lean) -- 4 oz has 118 mg. Fat-trimmed lamb loin -- 4 oz has 106 mg. Lean ground beef (90% lean) -- 4 oz has 100 mg. Lobster -- 3.5 oz has 90 mg. Pork loin chops -- 4 oz has 86 mg. Canned salmon -- 3.5 oz has 83 mg. Fat-trimmed beef top loin -- 4 oz has 78 mg. Frankfurter -- 1 frank (3.5 oz) has 77 mg. Crab -- 3.5 oz has 71 mg. Roasted chicken without skin, white meat -- 4 oz has 66 mg. Light bologna -- 2 oz has 45 mg. Deli-cut [place] -- 2 oz has 31 mg. Canned tuna -- 3.5 oz has 31  mg. Berniece Salines  -- 1 oz has 29 mg. Oysters and mussels (raw) -- 3.5 oz has 25 mg. Mackerel -- 1 oz has 22 mg. Trout -- 1 oz has 20 mg. Pork sausage -- 1 link (1 oz) has 17 mg. Salmon -- 1 oz has 16 mg. Tilapia -- 1 oz has 14 mg. Dairy Soft-serve ice cream --  cup (4 oz) has 103 mg. Whole-milk yogurt -- 1 cup (8 oz) has 29 mg. Cheddar cheese -- 1 oz has 28 mg. American cheese -- 1 oz has 28 mg. Whole milk -- 1 cup (8 oz) has 23 mg. 2% milk -- 1 cup (8 oz) has 18 mg. Cream cheese -- 1 tablespoon (Tbsp) has 15 mg. Cottage cheese --  cup (4 oz) has 14 mg. Low-fat (1%) milk -- 1 cup (8 oz) has 10 mg. Sour cream -- 1 Tbsp has 8.5 mg. Low-fat yogurt -- 1 cup (8 oz) has 8 mg. Nonfat Greek yogurt -- 1 cup (8 oz) has 7 mg. Half-and-half cream -- 1 Tbsp has 5 mg. Fats and oils Cod liver oil -- 1 tablespoon (Tbsp) has 82 mg. Butter -- 1 Tbsp has 15 mg. Lard -- 1 Tbsp has 14 mg. Bacon grease -- 1 Tbsp has 14 mg. Mayonnaise -- 1 Tbsp has 5-10 mg. Margarine -- 1 Tbsp has 3-10 mg. Exact amounts of cholesterol in these foods may vary depending on specific ingredients and brands. Foods without cholesterol Most plant-based foods do not have cholesterol unless you combine them with a food that has cholesterol. Foods without cholesterol include: Grains and cereals. Vegetables. Fruits. Vegetable oils, such as olive, canola, and sunflower oil. Legumes, such as peas, beans, and lentils. Nuts and seeds. Egg whites. Summary The body needs cholesterol in small amounts, but too much cholesterol can cause damage to the arteries and heart. Most people should eat less than 200 milligrams (mg) of cholesterol a day. This information is not intended to replace advice given to you by your health care provider. Make sure you discuss any questions you have with your health care provider. Document Revised: 09/20/2019 Document Reviewed: 10/31/2019 Elsevier Patient Education  [place] patient verbalized  understanding of instructions, educational materials, and care plan provided today and agreed to receive a mailed copy of patient instructions, educational materials, and care plan Telephone follow up appointment with care management team member scheduled for:  Tuesday, June 25, 2021 at 9:00 am The patient has been provided with contact information for the care management team and has been advised to call with any health related questions or concerns  [name], RN, BSN, [place] [phone]: direct office [phone]: mobile

## 2021-04-11 ENCOUNTER — Other Ambulatory Visit (HOSPITAL_COMMUNITY): Payer: Self-pay

## 2021-04-15 ENCOUNTER — Other Ambulatory Visit (HOSPITAL_COMMUNITY): Payer: Self-pay

## 2021-04-22 DIAGNOSIS — E118 Type 2 diabetes mellitus with unspecified complications: Secondary | ICD-10-CM

## 2021-04-22 DIAGNOSIS — I10 Essential (primary) hypertension: Secondary | ICD-10-CM | POA: Diagnosis not present

## 2021-04-23 ENCOUNTER — Other Ambulatory Visit: Payer: Self-pay | Admitting: Interventional Cardiology

## 2021-04-23 ENCOUNTER — Other Ambulatory Visit (HOSPITAL_COMMUNITY): Payer: Self-pay

## 2021-04-23 MED ORDER — METOPROLOL SUCCINATE ER 25 MG PO TB24
25.0000 mg | ORAL_TABLET | Freq: Every day | ORAL | 0 refills | Status: DC
  Filled 2021-04-23: qty 30, 30d supply, fill #0

## 2021-04-26 ENCOUNTER — Other Ambulatory Visit (HOSPITAL_COMMUNITY): Payer: Self-pay

## 2021-04-29 ENCOUNTER — Other Ambulatory Visit (HOSPITAL_COMMUNITY): Payer: Self-pay

## 2021-05-05 ENCOUNTER — Other Ambulatory Visit: Payer: Self-pay | Admitting: Internal Medicine

## 2021-05-05 DIAGNOSIS — E118 Type 2 diabetes mellitus with unspecified complications: Secondary | ICD-10-CM

## 2021-05-05 MED ORDER — OZEMPIC (0.25 OR 0.5 MG/DOSE) 2 MG/1.5ML ~~LOC~~ SOPN
0.5000 mg | PEN_INJECTOR | SUBCUTANEOUS | 2 refills | Status: DC
  Filled 2021-05-05: qty 4.5, 84d supply, fill #0

## 2021-05-06 ENCOUNTER — Other Ambulatory Visit (HOSPITAL_COMMUNITY): Payer: Self-pay

## 2021-05-10 DIAGNOSIS — M47816 Spondylosis without myelopathy or radiculopathy, lumbar region: Secondary | ICD-10-CM | POA: Diagnosis not present

## 2021-05-13 DIAGNOSIS — M545 Low back pain, unspecified: Secondary | ICD-10-CM | POA: Diagnosis not present

## 2021-05-14 ENCOUNTER — Emergency Department (HOSPITAL_COMMUNITY): Payer: 59

## 2021-05-14 ENCOUNTER — Encounter (HOSPITAL_COMMUNITY): Payer: Self-pay | Admitting: Emergency Medicine

## 2021-05-14 ENCOUNTER — Other Ambulatory Visit: Payer: Self-pay

## 2021-05-14 ENCOUNTER — Emergency Department (HOSPITAL_COMMUNITY)
Admission: EM | Admit: 2021-05-14 | Discharge: 2021-05-15 | Disposition: A | Discharge status: Home / Self Care | Payer: 59 | Attending: Emergency Medicine | Admitting: Emergency Medicine

## 2021-05-14 DIAGNOSIS — E1122 Type 2 diabetes mellitus with diabetic chronic kidney disease: Secondary | ICD-10-CM | POA: Insufficient documentation

## 2021-05-14 DIAGNOSIS — Z7901 Long term (current) use of anticoagulants: Secondary | ICD-10-CM | POA: Insufficient documentation

## 2021-05-14 DIAGNOSIS — R531 Weakness: Secondary | ICD-10-CM | POA: Diagnosis not present

## 2021-05-14 DIAGNOSIS — E1140 Type 2 diabetes mellitus with diabetic neuropathy, unspecified: Secondary | ICD-10-CM | POA: Diagnosis not present

## 2021-05-14 DIAGNOSIS — Z20822 Contact with and (suspected) exposure to covid-19: Secondary | ICD-10-CM | POA: Diagnosis not present

## 2021-05-14 DIAGNOSIS — I129 Hypertensive chronic kidney disease with stage 1 through stage 4 chronic kidney disease, or unspecified chronic kidney disease: Secondary | ICD-10-CM | POA: Diagnosis not present

## 2021-05-14 DIAGNOSIS — K219 Gastro-esophageal reflux disease without esophagitis: Secondary | ICD-10-CM | POA: Insufficient documentation

## 2021-05-14 DIAGNOSIS — R7989 Other specified abnormal findings of blood chemistry: Secondary | ICD-10-CM | POA: Insufficient documentation

## 2021-05-14 DIAGNOSIS — Z794 Long term (current) use of insulin: Secondary | ICD-10-CM | POA: Insufficient documentation

## 2021-05-14 DIAGNOSIS — I4891 Unspecified atrial fibrillation: Secondary | ICD-10-CM | POA: Diagnosis not present

## 2021-05-14 DIAGNOSIS — N183 Chronic kidney disease, stage 3 unspecified: Secondary | ICD-10-CM | POA: Insufficient documentation

## 2021-05-14 DIAGNOSIS — R42 Dizziness and giddiness: Secondary | ICD-10-CM | POA: Diagnosis not present

## 2021-05-14 DIAGNOSIS — K529 Noninfective gastroenteritis and colitis, unspecified: Secondary | ICD-10-CM

## 2021-05-14 DIAGNOSIS — Z79899 Other long term (current) drug therapy: Secondary | ICD-10-CM | POA: Diagnosis not present

## 2021-05-14 LAB — COMPREHENSIVE METABOLIC PANEL
ALT: 25 U/L (ref 0–44)
AST: 25 U/L (ref 15–41)
Albumin: 3.8 g/dL (ref 3.5–5.0)
Alkaline Phosphatase: 77 U/L (ref 38–126)
Anion gap: 7 (ref 5–15)
BUN: 21 mg/dL (ref 8–23)
CO2: 25 mmol/L (ref 22–32)
Calcium: 9 mg/dL (ref 8.9–10.3)
Chloride: 105 mmol/L (ref 98–111)
Creatinine, Ser: 1.78 mg/dL — ABNORMAL HIGH (ref 0.61–1.24)
GFR, Estimated: 36 mL/min — ABNORMAL LOW (ref >60)
Glucose, Bld: 146 mg/dL — ABNORMAL HIGH (ref 70–99)
Potassium: 5 mmol/L (ref 3.5–5.1)
Sodium: 137 mmol/L (ref 135–145)
Total Bilirubin: 0.7 mg/dL (ref 0.3–1.2)
Total Protein: 6.9 g/dL (ref 6.5–8.1)

## 2021-05-14 LAB — RESP PANEL BY RT-PCR (FLU A&B, COVID) ARPGX2
Influenza A by PCR: NEGATIVE
Influenza B by PCR: NEGATIVE
SARS Coronavirus 2 by RT PCR: NEGATIVE

## 2021-05-14 LAB — URINALYSIS, ROUTINE W REFLEX MICROSCOPIC
Bilirubin Urine: NEGATIVE
Glucose, UA: NEGATIVE mg/dL
Hgb urine dipstick: NEGATIVE
Ketones, ur: NEGATIVE mg/dL
Leukocytes,Ua: NEGATIVE
Nitrite: NEGATIVE
Protein, ur: NEGATIVE mg/dL
Specific Gravity, Urine: 1.005 (ref 1.005–1.030)
pH: 6 (ref 5.0–8.0)

## 2021-05-14 LAB — CBC WITH DIFFERENTIAL/PLATELET
Abs Immature Granulocytes: 0.02 10*3/uL (ref 0.00–0.07)
Basophils Absolute: 0 10*3/uL (ref 0.0–0.1)
Basophils Relative: 0 %
Eosinophils Absolute: 0.1 10*3/uL (ref 0.0–0.5)
Eosinophils Relative: 2 %
HCT: 37.2 % — ABNORMAL LOW (ref 39.0–52.0)
Hemoglobin: 12.3 g/dL — ABNORMAL LOW (ref 13.0–17.0)
Immature Granulocytes: 0 %
Lymphocytes Relative: 19 %
Lymphs Abs: 1.2 10*3/uL (ref 0.7–4.0)
MCH: 31.9 pg (ref 26.0–34.0)
MCHC: 33.1 g/dL (ref 30.0–36.0)
MCV: 96.4 fL (ref 80.0–100.0)
Monocytes Absolute: 0.6 10*3/uL (ref 0.1–1.0)
Monocytes Relative: 9 %
Neutro Abs: 4.3 10*3/uL (ref 1.7–7.7)
Neutrophils Relative %: 70 %
Platelets: 206 10*3/uL (ref 150–400)
RBC: 3.86 MIL/uL — ABNORMAL LOW (ref 4.22–5.81)
RDW: 13.2 % (ref 11.5–15.5)
WBC: 6.3 10*3/uL (ref 4.0–10.5)
nRBC: 0 % (ref 0.0–0.2)

## 2021-05-14 LAB — PROTIME-INR
INR: 1 (ref 0.8–1.2)
Prothrombin Time: 13.3 seconds (ref 11.4–15.2)

## 2021-05-14 LAB — CBG MONITORING, ED: Glucose-Capillary: 131 mg/dL — ABNORMAL HIGH (ref 70–99)

## 2021-05-14 LAB — TSH: TSH: 2.846 u[IU]/mL (ref 0.350–4.500)

## 2021-05-14 MED ORDER — MECLIZINE HCL 12.5 MG PO TABS: 12.5000 mg | ORAL_TABLET | Freq: Three times a day (TID) | ORAL | 0 refills | Status: DC | PRN

## 2021-05-14 MED ORDER — ONDANSETRON HCL 4 MG PO TABS: 4.0000 mg | ORAL_TABLET | Freq: Four times a day (QID) | ORAL | 0 refills | Status: DC

## 2021-05-14 MED ORDER — ONDANSETRON 4 MG PO TBDP
4.0000 mg | ORAL_TABLET | Freq: Once | ORAL | Status: AC
  Administered 2021-05-14: 4 mg via ORAL
  Filled 2021-05-14: qty 1

## 2021-05-14 MED ORDER — GADOBUTROL 1 MMOL/ML IV SOLN
7.5000 mL | Freq: Once | INTRAVENOUS | Status: AC | PRN
  Administered 2021-05-14: 7.5 mL via INTRAVENOUS

## 2021-05-14 MED ORDER — MECLIZINE HCL 25 MG PO TABS
12.5000 mg | ORAL_TABLET | Freq: Once | ORAL | Status: AC
  Administered 2021-05-14: 12.5 mg via ORAL
  Filled 2021-05-14: qty 1

## 2021-05-14 NOTE — Discharge Instructions (Addendum)
You were evaluated in the Emergency Department and after careful evaluation, we did not find any emergent condition requiring admission or further testing in the hospital.  Your exam/testing today was overall reassuring.  Your MRI was negative for stroke or vascular abnormality in her head or neck which could explain your dizziness.  Your symptoms of nausea, vomiting and diarrhea are likely due to a gastroenteritis.  Will prescribe Zofran and meclizine.  Please return to the Emergency Department if you experience any worsening of your condition.  Thank you for allowing [place] to be a part of your care.

## 2021-05-14 NOTE — ED Provider Notes (Addendum)
[name] Arrival date & time: 05/14/21  1610     History Chief Complaint  Patient presents with   Weakness    Dale Anderson is a 86 y.o. male.   Weakness Associated symptoms: dizziness   Associated symptoms: no abdominal pain, no arthralgias, no chest pain, no cough, no dysuria, no fever, no headaches, no seizures, no shortness of breath and no vomiting    87 year old male with a medical history significant for degenerative disc disease of the spine, anxiety, GERD, HLD, HTN who presents to the emergency department with a chief complaint of dizziness.  He states that for the past 4 to 5 days he has had ongoing dizziness.  He describes it as a room spinning sensation that is worse with lying flat.  He states that when he lies flat he has difficulty sleeping because he gets dizzy.  Symptoms are better with rest.  He has had some difficulty ambulating due to the's complaint of dizziness.  An hour prior to arrival, a coworker saw him coming out of the bathroom and stopped him because he had an unsteady gait.  At that time he had a low blood sugar 68.  He states that last week he had a fall due to weakness in his right leg however he states that he always has right leg weakness with radicular pain.  He states that he has had weakness in his right leg for months.  He denies any back pain.  He endorses several episodes of diarrhea.   Past Medical History:  Diagnosis Date   Adhesive capsulitis of right shoulder    Anxiety    Diabetes mellitus (HCC)    DJD (degenerative joint disease)    GERD (gastroesophageal reflux disease)    Hemorrhoid    History of shingles    Hyperlipidemia    Hypertension    Nodular prostate without urinary obstruction    Recurrent bronchiectasis (HCC)    Tear of right rotator cuff     Patient Active Problem List   Diagnosis Date Noted   Rotator cuff insufficiency of left shoulder  12/28/2020   Encounter for general adult medical examination with abnormal findings 07/18/2020   Atrial fibrillation by electrocardiogram ([place]) 01/12/2020   Murmur, cardiac 01/04/2020   Bradycardia 01/04/2020   Internal hemorrhoid 12/08/2019   Tinea cruris 12/05/2019   Seborrheic dermatitis of scalp 05/05/2019   Diabetic polyneuropathy associated with type 2 diabetes mellitus ([place]) 03/29/2019   Seasonal allergic rhinitis due to pollen 05/24/2018   Sciatica 10/21/2017   Chronic renal disease, stage 3, moderately decreased glomerular filtration rate (GFR) between 30-59 mL/min/1.73 square meter ([place]) 07/02/2017   Routine general medical examination at a health care facility 12/26/2013   DDD (degenerative disc disease), cervical 12/26/2013   Hypertension    Hyperlipidemia with target LDL less than 100    Type II diabetes mellitus with manifestations (HCC)    GERD (gastroesophageal reflux disease)    DJD (degenerative joint disease)    Anxiety     Past Surgical History:  Procedure Laterality Date   COLONOSCOPY  1998   HAND RECONSTRUCTION     right   INGUINAL HERNIA REPAIR  2006   Right.  Dr. Bubba Camp   ROTATOR CUFF REPAIR  1995   Left.  Dr. Wonda Olds   SHOULDER ARTHROSCOPY WITH ROTATOR CUFF REPAIR AND SUBACROMIAL DECOMPRESSION  05/25/2012   Procedure: SHOULDER ARTHROSCOPY WITH ROTATOR CUFF REPAIR AND SUBACROMIAL DECOMPRESSION;  Surgeon: Lorn Junes, MD;  Location: [facility];  Service: Orthopedics;  Laterality: Right;  Right Shoulder Arthroscopy shoulder decompression subacromial partial acromioplasty with coracoaromial release, arthroscopy shoulder distal claviculectomy, arthroscopy shoulder with rotator cuff repai       Family History  Problem Relation Age of Onset   Heart disease Father        MI at age 64   Emphysema Other        cousin   Colon cancer Neg Hx     Social History   Tobacco Use   Smoking status: Never   Smokeless tobacco: Never  Vaping Use    Vaping Use: Never used  Substance Use Topics   Alcohol use: No   Drug use: No    Home Medications Prior to Admission medications   Medication Sig Start Date End Date Taking? Authorizing Provider  ALPRAZolam (XANAX) 0.5 MG tablet TAKE 1 TABLET BY MOUTH DAILY AS NEEDED FOR ANXIETY Patient taking differently: Take 0.5 mg by mouth daily as needed for anxiety. 01/23/20  Yes [name], MD  apixaban (ELIQUIS) 2.5 MG TABS tablet Take 1 tablet (2.5 mg total) by mouth 2 (two) times daily. 11/20/20  Yes [name], MD  atorvastatin (LIPITOR) 20 MG tablet Take 1 tablet (20 mg total) by mouth daily. 02/14/21  Yes [name], MD  Cholecalciferol (VITAMIN D-3) 25 MCG (1000 UT) CAPS Take 1,000 Units by mouth daily.   Yes [provider]  Continuous Blood Gluc Sensor (FREESTYLE LIBRE 2 SENSOR) MISC USE AS DIRECTED Patient taking differently: Inject 1 Device into the skin every 14 (fourteen) days. 02/20/21 02/20/22 Yes [name], MD  insulin glargine-yfgn (SEMGLEE, YFGN,) 100 UNIT/ML SOPN Inject 10 units into the skin daily. 01/09/21  Yes [name], MD  L-Methylfolate-Algae-B12-B6 3-90.314-2-35 MG CAPS Take 1 capsule by mouth 2 (two) times daily. 03/08/21  Yes [name], MD  meclizine (ANTIVERT) 12.5 MG tablet Take 1 tablet (12.5 mg total) by mouth 3 (three) times daily as needed for dizziness. 05/14/21  Yes Regan Lemming, MD  metoprolol succinate (TOPROL-XL) 25 MG 24 hr tablet Take 1 tablet (25 mg total) by mouth daily. Needs office visit. Patient taking differently: Take 25 mg by mouth in the morning. 04/23/21  Yes Belva Crome, MD  Multiple Vitamins-Minerals (CENTRUM SILVER PO) Take 1 tablet by mouth daily with breakfast.   Yes [provider]  ondansetron (ZOFRAN) 4 MG tablet Take 1 tablet (4 mg total) by mouth every 6 (six) hours. 05/14/21  Yes Regan Lemming, MD  pantoprazole (PROTONIX) 40 MG tablet Take 1 tablet (40 mg total) by mouth daily. 02/14/21  Yes  [name], MD  Semaglutide,0.25 or 0.5MG/DOS, (OZEMPIC, 0.25 OR 0.5 MG/DOSE,) 2 MG/1.5ML SOPN Inject 0.5 mg into the skin once a week on Sundays Patient taking differently: Inject 0.5 mg into the skin every Sunday. 05/05/21  Yes [name], MD  tamsulosin (FLOMAX) 0.4 MG CAPS capsule TAKE 1 CAPSULE BY MOUTH DAILY. Patient taking differently: Take 0.4 mg by mouth at bedtime. 07/27/20 07/27/21 Yes [name], MD  blood glucose meter kit and supplies KIT Use to test blood sugar once daily. DX: E11.9 07/24/16   [name], MD  Blood Glucose Monitoring Suppl (FREESTYLE LITE) DEVI  07/24/16   [provider]  Continuous Blood Gluc Receiver (FREESTYLE LIBRE 2 READER) DEVI USE AS DIRECTED 08/15/20 08/15/21  [name], MD  diclofenac Sodium (VOLTAREN)  1 % GEL Apply 4 g topically 4 (four) times daily. Patient not taking: Reported on 05/14/2021 01/24/21   Palumbo, April, MD  glucose blood (FREESTYLE LITE) test strip USE TO CHECK BLOOD SUGAR ONCE DAILY 02/08/19   [name], MD  Lancets MISC Use to test blood sugar once daily. DX E11.09 07/24/16   [name], MD  lidocaine (LIDODERM) 5 % Place 1 patch onto the skin daily. Remove & Discard patch within 12 hours or as directed by MD Patient not taking: Reported on 05/14/2021 01/24/21   Palumbo, April, MD  lisinopril (ZESTRIL) 40 MG tablet Take 0.5 tablets by mouth daily. Patient not taking: Reported on 05/14/2021 07/20/06   [provider]  meloxicam (MOBIC) 7.5 MG tablet Take 1 tablet (7.5 mg total) by mouth daily with meals Patient not taking: Reported on 05/14/2021 03/11/21     telmisartan (MICARDIS) 40 MG tablet TAKE 1 TABLET BY MOUTH DAILY. 04/04/20 07/17/20  [name], MD    Allergies    Lisinopril  Review of Systems   Review of Systems  Constitutional:  Negative for chills and fever.  HENT:  Negative for ear pain and sore throat.   Eyes:  Negative for pain and visual disturbance.  Respiratory:  Negative  for cough and shortness of breath.   Cardiovascular:  Negative for chest pain and palpitations.  Gastrointestinal:  Negative for abdominal pain and vomiting.  Genitourinary:  Negative for dysuria and hematuria.  Musculoskeletal:  Negative for arthralgias and back pain.  Skin:  Negative for color change and rash.  Neurological:  Positive for dizziness and weakness. Negative for seizures, syncope, speech difficulty, numbness and headaches.  All other systems reviewed and are negative.  Physical Exam Updated Vital Signs BP (!) 151/67 (BP Location: Right Arm)   Pulse 74   Temp 97.8 F (36.6 C) (Oral)   Resp 16   SpO2 100%   Physical Exam Vitals and nursing note reviewed.  Constitutional:      General: He is not in acute distress.    Appearance: He is well-developed.  HENT:     Head: Normocephalic and atraumatic.  Eyes:     Conjunctiva/sclera: Conjunctivae normal.     Pupils: Pupils are equal, round, and reactive to light.  Cardiovascular:     Rate and Rhythm: Normal rate and regular rhythm.     Heart sounds: No murmur heard. Pulmonary:     Effort: Pulmonary effort is normal. No respiratory distress.     Breath sounds: Normal breath sounds.  Abdominal:     General: There is no distension.     Palpations: Abdomen is soft.     Tenderness: There is no abdominal tenderness. There is no guarding.  Musculoskeletal:        General: No swelling, deformity or signs of injury.     Cervical back: Neck supple.  Skin:    General: Skin is warm and dry.     Capillary Refill: Capillary refill takes less than 2 seconds.     Findings: No lesion or rash.  Neurological:     Mental Status: He is alert.     Comments: MENTAL STATUS EXAM:    Orientation: Alert and oriented to person, place and time.  Memory: Cooperative, follows commands well.  Language: Speech is clear and language is normal.   CRANIAL NERVES:    CN 2 (Optic): Visual fields intact to confrontation.  CN 3,4,6 (EOM): Pupils  equal and reactive to light. Full extraocular  eye movement without nystagmus.  CN 5 (Trigeminal): Facial sensation is normal, no weakness of masticatory muscles.  CN 7 (Facial): No facial weakness or asymmetry.  CN 8 (Auditory): Auditory acuity grossly normal.  CN 9,10 (Glossophar): The uvula is midline, the palate elevates symmetrically.  CN 11 (spinal access): Normal sternocleidomastoid and trapezius strength.  CN 12 (Hypoglossal): The tongue is midline. No atrophy or fasciculations.Marland Kitchen   MOTOR:  Muscle Strength: 5/5RUE, 5/5LUE, 4/5RLE (pt states is chronic), 5/5LLE.   COORDINATION:   Intact finger-to-nose, no tremor.   SENSATION:   Intact to light touch all four extremities.  GAIT: Gait not assessed due to fall risk   Psychiatric:        Mood and Affect: Mood normal.   HINTS Exam: Negative test of skew, no nystagmus, abnormal head impulse test normal.  ED Results / Procedures / Treatments   Labs (all labs ordered are listed, but only abnormal results are displayed) Labs Reviewed  URINALYSIS, ROUTINE W REFLEX MICROSCOPIC - Abnormal; Notable for the following components:      Result Value   Color, Urine STRAW (*)    All other components within normal limits  COMPREHENSIVE METABOLIC PANEL - Abnormal; Notable for the following components:   Glucose, Bld 146 (*)    Creatinine, Ser 1.78 (*)    GFR, Estimated 36 (*)    All other components within normal limits  CBC WITH DIFFERENTIAL/PLATELET - Abnormal; Notable for the following components:   RBC 3.86 (*)    Hemoglobin 12.3 (*)    HCT 37.2 (*)    All other components within normal limits  CBG MONITORING, ED - Abnormal; Notable for the following components:   Glucose-Capillary 131 (*)    All other components within normal limits  RESP PANEL BY RT-PCR (FLU A&B, COVID) [id number]  GASTROINTESTINAL PANEL BY PCR, STOOL (REPLACES STOOL CULTURE)  TSH  PROTIME-INR    EKG None  Radiology CT HEAD WO CONTRAST (5MM)  Result Date:  05/14/2021 CLINICAL DATA:  Neuro deficit, acute, stroke suspected. Additional history provided: Weakness and worsening dizziness for 3 days. EXAM: CT HEAD WITHOUT CONTRAST TECHNIQUE: Contiguous axial images were obtained from the base of the skull through the vertex without intravenous contrast. COMPARISON:  Head CT 07/23/2020. FINDINGS: Brain: Mild-to-moderate for age generalized cerebral atrophy. Comparatively mild cerebellar atrophy. Redemonstrated chronic lacunar infarct within the left corona radiata. There is no acute intracranial hemorrhage. No demarcated cortical infarct. No extra-axial fluid collection. No evidence of an intracranial mass. No midline shift. Vascular: No hyperdense vessel.  Atherosclerotic calcifications. Skull: Normal. Negative for fracture or focal lesion. Sinuses/Orbits: Visualized orbits show no acute finding. Minimal mucosal thickening within the bilateral ethmoid and sphenoid sinuses. IMPRESSION: No evidence of acute intracranial abnormality. Redemonstrated chronic lacunar infarct within the left corona radiata. Cerebral and cerebellar atrophy, as described. Minimal bilateral ethmoid and sphenoid sinus mucosal thickening. Electronically Signed   By: Kellie Simmering D.O.   On: 05/14/2021 18:31   MR ANGIO HEAD WO CONTRAST  Result Date: 05/14/2021 CLINICAL DATA:  Dizziness, persistent or recurrent, stroke suspected EXAM: MRI HEAD WITHOUT AND WITH CONTRAST MRA HEAD WITHOUT CONTRAST MRA NECK WITHOUT AND WITH CONTRAST TECHNIQUE: Multiplanar, multi-echo pulse sequences of the brain and surrounding structures were acquired without and with intravenous contrast. Angiographic images of the Circle of Willis were acquired using MRA technique without intravenous contrast. Angiographic images of the neck were acquired using MRA technique without and with intravenous contrast. Carotid stenosis measurements (when applicable) are obtained utilizing  NASCET criteria, using the distal internal carotid  diameter as the denominator. CONTRAST:  7.99m GADAVIST GADOBUTROL 1 MMOL/ML IV SOLN COMPARISON:  No prior MRI, correlation is made with CT head 05/14/2021 FINDINGS: MRI HEAD FINDINGS Brain: No restricted diffusion to suggest acute or subacute infarction. No acute hemorrhage, mass, mass effect, or midline shift. No abnormal parenchymal enhancement. Lacunar infarct in the left corona radiata. Scattered T2 hyperintense signal in the periventricular white matter, likely the sequela of moderate chronic small vessel ischemic disease. Vascular: See MRA findings, below. Skull and upper cervical spine: Normal marrow signal. Degenerative changes in the cervical spine. Sinuses/Orbits: Mucosal thickening right maxillary sinus. Otherwise negative. Other: The mastoids are well aerated. MRA HEAD FINDINGS Anterior circulation: Both internal carotid arteries are patent to the termini, without stenosis or other abnormality. A1 segments patent. Normal anterior communicating artery. Anterior cerebral arteries are patent to their distal aspects. No M1 stenosis or occlusion. Normal MCA bifurcations. Distal MCA branches perfused and symmetric. Posterior circulation: Vertebral arteries patent to the vertebrobasilar junction without stenosis. Posterior inferior cerebral arteries patent bilaterally. Basilar patent to its distal aspect. Superior cerebellar arteries patent bilaterally. PCAs perfused to their distal aspects without stenosis. The bilateral posterior communicating arteries are not visualized. Anatomic variants: None significant MRA NECK FINDINGS Aortic arch: Normal aortic branching. No evidence of stenosis, aneurysm, or dissection. Right carotid system: Patent. No evidence of stenosis, aneurysm, or dissection. Left carotid system: Patent. No evidence of stenosis, aneurysm, or dissection. Vertebral arteries: Patent.  No evidence of stenosis or dissection. Other: None IMPRESSION: 1. No acute intracranial process. 2. No  hemodynamically significant stenosis in the neck. 3. No intracranial large vessel occlusion or stenosis. Electronically Signed   By: AMerilyn BabaM.D.   On: 05/14/2021 23:41   MR Angiogram Neck W or Wo Contrast  Result Date: 05/14/2021 CLINICAL DATA:  Dizziness, persistent or recurrent, stroke suspected EXAM: MRI HEAD WITHOUT AND WITH CONTRAST MRA HEAD WITHOUT CONTRAST MRA NECK WITHOUT AND WITH CONTRAST TECHNIQUE: Multiplanar, multi-echo pulse sequences of the brain and surrounding structures were acquired without and with intravenous contrast. Angiographic images of the Circle of Willis were acquired using MRA technique without intravenous contrast. Angiographic images of the neck were acquired using MRA technique without and with intravenous contrast. Carotid stenosis measurements (when applicable) are obtained utilizing NASCET criteria, using the distal internal carotid diameter as the denominator. CONTRAST:  7.562mGADAVIST GADOBUTROL 1 MMOL/ML IV SOLN COMPARISON:  No prior MRI, correlation is made with CT head 05/14/2021 FINDINGS: MRI HEAD FINDINGS Brain: No restricted diffusion to suggest acute or subacute infarction. No acute hemorrhage, mass, mass effect, or midline shift. No abnormal parenchymal enhancement. Lacunar infarct in the left corona radiata. Scattered T2 hyperintense signal in the periventricular white matter, likely the sequela of moderate chronic small vessel ischemic disease. Vascular: See MRA findings, below. Skull and upper cervical spine: Normal marrow signal. Degenerative changes in the cervical spine. Sinuses/Orbits: Mucosal thickening right maxillary sinus. Otherwise negative. Other: The mastoids are well aerated. MRA HEAD FINDINGS Anterior circulation: Both internal carotid arteries are patent to the termini, without stenosis or other abnormality. A1 segments patent. Normal anterior communicating artery. Anterior cerebral arteries are patent to their distal aspects. No M1 stenosis  or occlusion. Normal MCA bifurcations. Distal MCA branches perfused and symmetric. Posterior circulation: Vertebral arteries patent to the vertebrobasilar junction without stenosis. Posterior inferior cerebral arteries patent bilaterally. Basilar patent to its distal aspect. Superior cerebellar arteries patent bilaterally. PCAs perfused to their distal aspects without  stenosis. The bilateral posterior communicating arteries are not visualized. Anatomic variants: None significant MRA NECK FINDINGS Aortic arch: Normal aortic branching. No evidence of stenosis, aneurysm, or dissection. Right carotid system: Patent. No evidence of stenosis, aneurysm, or dissection. Left carotid system: Patent. No evidence of stenosis, aneurysm, or dissection. Vertebral arteries: Patent.  No evidence of stenosis or dissection. Other: None IMPRESSION: 1. No acute intracranial process. 2. No hemodynamically significant stenosis in the neck. 3. No intracranial large vessel occlusion or stenosis. Electronically Signed   By: Merilyn Baba M.D.   On: 05/14/2021 23:41   MR Brain W and Wo Contrast  Result Date: 05/14/2021 CLINICAL DATA:  Dizziness, persistent or recurrent, stroke suspected EXAM: MRI HEAD WITHOUT AND WITH CONTRAST MRA HEAD WITHOUT CONTRAST MRA NECK WITHOUT AND WITH CONTRAST TECHNIQUE: Multiplanar, multi-echo pulse sequences of the brain and surrounding structures were acquired without and with intravenous contrast. Angiographic images of the Circle of Willis were acquired using MRA technique without intravenous contrast. Angiographic images of the neck were acquired using MRA technique without and with intravenous contrast. Carotid stenosis measurements (when applicable) are obtained utilizing NASCET criteria, using the distal internal carotid diameter as the denominator. CONTRAST:  7.4m GADAVIST GADOBUTROL 1 MMOL/ML IV SOLN COMPARISON:  No prior MRI, correlation is made with CT head 05/14/2021 FINDINGS: MRI HEAD FINDINGS  Brain: No restricted diffusion to suggest acute or subacute infarction. No acute hemorrhage, mass, mass effect, or midline shift. No abnormal parenchymal enhancement. Lacunar infarct in the left corona radiata. Scattered T2 hyperintense signal in the periventricular white matter, likely the sequela of moderate chronic small vessel ischemic disease. Vascular: See MRA findings, below. Skull and upper cervical spine: Normal marrow signal. Degenerative changes in the cervical spine. Sinuses/Orbits: Mucosal thickening right maxillary sinus. Otherwise negative. Other: The mastoids are well aerated. MRA HEAD FINDINGS Anterior circulation: Both internal carotid arteries are patent to the termini, without stenosis or other abnormality. A1 segments patent. Normal anterior communicating artery. Anterior cerebral arteries are patent to their distal aspects. No M1 stenosis or occlusion. Normal MCA bifurcations. Distal MCA branches perfused and symmetric. Posterior circulation: Vertebral arteries patent to the vertebrobasilar junction without stenosis. Posterior inferior cerebral arteries patent bilaterally. Basilar patent to its distal aspect. Superior cerebellar arteries patent bilaterally. PCAs perfused to their distal aspects without stenosis. The bilateral posterior communicating arteries are not visualized. Anatomic variants: None significant MRA NECK FINDINGS Aortic arch: Normal aortic branching. No evidence of stenosis, aneurysm, or dissection. Right carotid system: Patent. No evidence of stenosis, aneurysm, or dissection. Left carotid system: Patent. No evidence of stenosis, aneurysm, or dissection. Vertebral arteries: Patent.  No evidence of stenosis or dissection. Other: None IMPRESSION: 1. No acute intracranial process. 2. No hemodynamically significant stenosis in the neck. 3. No intracranial large vessel occlusion or stenosis. Electronically Signed   By: AMerilyn BabaM.D.   On: 05/14/2021 23:41     Procedures Procedures   Medications Ordered in ED Medications  meclizine (ANTIVERT) tablet 12.5 mg (12.5 mg Oral Given 05/14/21 1816)  ondansetron (ZOFRAN-ODT) disintegrating tablet 4 mg (4 mg Oral Given 05/14/21 1816)  gadobutrol (GADAVIST) 1 MMOL/ML injection 7.5 mL (7.5 mLs Intravenous Contrast Given 05/14/21 2302)  gadobutrol (GADAVIST) 1 MMOL/ML injection 7.5 mL (7.5 mLs Intravenous Contrast Given 05/14/21 2306)    ED Course  I have reviewed the triage vital signs and the nursing notes.  Pertinent labs & imaging results that were available during my care of the patient were reviewed by me and considered in  my medical decision making (see chart for details).    MDM Rules/Calculators/A&P                           87 year old male with a medical history significant for degenerative disc disease of the spine, anxiety, GERD, HLD, HTN who presents to the emergency department with a chief complaint of dizziness.  He states that for the past 4 to 5 days he has had ongoing dizziness.  He describes it as a room spinning sensation that is worse with lying flat.  He states that when he lies flat he has difficulty sleeping because he gets dizzy.  Symptoms are better with rest.  He has had some difficulty ambulating due to the's complaint of dizziness.  An hour prior to arrival, a coworker saw him coming out of the bathroom and stopped him because he had an unsteady gait.  At that time he had a low blood sugar 68.  He states that last week he had a fall due to weakness in his right leg however he states that he always has right leg weakness with radicular pain.  He states that he has had weakness in his right leg for months.  He denies any back pain.  He endorses several episodes of diarrhea.  Additionally, the patient has had few episodes of NBNB emesis.  On arrival, the patient was afebrile, hemodynamically stable with a neurologic exam that he is at baseline.  He has a negative head impulse  test.  Unable to rule out central etiology of the patient's vertigo so MRI imaging was obtained. .  The patient had a CT of his head prior to MRI imaging that revealed no acute intracranial abnormality, redemonstrated chronic lacunar infarct within the left corona radiata, cerebral and cerebellar atrophy as described.  He has no back pain, chronic right lower extremity weakness which is at baseline, ne fevers or chills, and no urinary or fecal incontinence.   Laboratory work-up initiated which revealed a CMP with renal function apparently at baseline with a creatinine of 1.78 from baseline around 1.7, no electrolyte abnormality, TSH normal, urinalysis without evidence of UTI, CBC without a leukocytosis, stable anemia to 12.3, no platelet abnormality, COVID-19 and influenza PCR testing resulted negative.  The patient has no significant abdominal tenderness palpation on exam.  I do not think emergent abdominal imaging is warranted at this time.  Symptoms are most consistent with a likely gastroenteritis.  The patient was administered Zofran and meclizine with improvement in symptoms.  MRI imaging of the patient's brain to include MRA of the head and neck performed which revealed no acute intracranial process with no hemodynamically significant stenosis in the neck and no large vessel occlusion or stenosis.  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  No emergent etiology identified of the patient's vertigo.  A prescription for meclizine and Zofran was provided, the patient was advised continued oral hydration and advised follow-up with his PCP as needed.  Overall stable for discharge.  Final Clinical Impression(s) / ED Diagnoses Final diagnoses:  Weakness  Gastroenteritis  Vertigo    Rx / DC Orders ED Discharge Orders          Ordered    meclizine (ANTIVERT) 12.5 MG tablet  3 times daily PRN        05/14/21 2357    ondansetron (ZOFRAN) 4 MG tablet  Every 6 hours        05/14/21  2357             Regan Lemming, MD 05/15/21 9528    Regan Lemming, MD 05/15/21 [phone]

## 2021-05-14 NOTE — ED Provider Notes (Signed)
Emergency Medicine Provider Triage Evaluation Note  Dale Anderson , a 87 y.o. male  was evaluated in triage.  Pt complains of dizziness.  He states that for 4 to 5 days he has had ongoing dizziness that is not positional.  It is worse when he lays down however it is present when sitting still.  He states that he has had difficulty walking due to this.  About an hour prior to arrival coworker saw him coming out of the bathroom and stopped him because he had unsteady gait.  He states that at that time he had a low blood sugar of 68.  He states that he fell last week due to weakness in his leg however he did not hit his head or lose consciousness.  He also states his blood sugars have been waxing and waning over the past few days.  He also endorses diarrhea.  Denies abdominal pain, nausea or vomiting, syncope, chest pain or palpitations  Review of Systems  Positive: See above Negative:   Physical Exam  BP (!) 150/86 (BP Location: Right Arm)   Pulse 76   Temp (!) 97.5 F (36.4 C) (Oral)   Resp 18   SpO2 97%  Gen:   Awake, no distress   Resp:  Normal effort  MSK:   Moves extremities without difficulty  Other:  No focal neurological deficits.  No pronator drift.  He does have mild horizontal nystagmus.  Strength 5/5 in bilateral upper extremities.  Strength 5/5 in left lower extremity strength 4/5 in right lower extremity (he notes he has been having pain in this leg which may be limiting the exam).  Abdomen is soft and nontender.  Medical Decision Making  Medically screening exam initiated at 4:20 PM.  Appropriate orders placed.  Rhea Thrun Bayley was informed that the remainder of the evaluation will be completed by another provider, this initial triage assessment does not replace that evaluation, and the importance of remaining in the ED until their evaluation is complete.     Mickie Hillier, PA-C 05/14/21 1629    Milton Ferguson, MD 05/14/21 2103

## 2021-05-14 NOTE — ED Notes (Signed)
Patient transported to MRI 

## 2021-05-14 NOTE — ED Notes (Signed)
Patient is resting comfortably.Wife at bedside. POC discussed re: stool sample, enteric isolation and urine sample.pt in NAD

## 2021-05-14 NOTE — ED Triage Notes (Signed)
Pt c/o weakness and worsening dizziness x 3 days. Triage tech noted pt to have unsteady gait when checking in. Reports his blood sugars have been fluctuating.

## 2021-05-14 NOTE — ED Notes (Signed)
Pt back from MRI in NAD. Still having unsteady gait. Up with assist x1.

## 2021-05-15 NOTE — ED Notes (Signed)
D/C instructions reviewed with pt and his wife.

## 2021-05-19 NOTE — Progress Notes (Signed)
Cardiology Office Note:    Date:  05/21/2021   ID:  Dale Anderson, DOB 10/23/1929, MRN [id number]  PCP:  [name], MD  Cardiologist:  Sinclair Grooms, MD   Referring MD: [name], MD   Chief Complaint  Patient presents with   Atrial Fibrillation    History of Present Illness:    Dale Anderson is a 87 y.o. male with a hx of DM II, hyperlipidemia, essential hypertension, anemia, recent orthostatic dizziness, ECG and monitor documented Atrial Fibrillation with RVR. 01/12/20 Eliquis started, and paroxysmal vertigo.  Recent emergency room visit for dizziness that was felt to be related to vertigo.  Improved with meclizine.  Asymptomatic from cardiac standpoint but he was concerned the dizziness had to do with the irregular heartbeat.  He was now having palpitations but may be association based on the direction.  EKG did not demonstrate any arrhythmia.  He was in sinus rhythm despite being dizzy.   Past Medical History:  Diagnosis Date   Adhesive capsulitis of right shoulder    Anxiety    Diabetes mellitus (HCC)    DJD (degenerative joint disease)    GERD (gastroesophageal reflux disease)    Hemorrhoid    History of shingles    Hyperlipidemia    Hypertension    Nodular prostate without urinary obstruction    Recurrent bronchiectasis (HCC)    Tear of right rotator cuff     Past Surgical History:  Procedure Laterality Date   COLONOSCOPY  1998   HAND RECONSTRUCTION     right   INGUINAL HERNIA REPAIR  2006   Right.  Dr. Bubba Camp   ROTATOR CUFF REPAIR  1995   Left.  Dr. Wonda Olds   SHOULDER ARTHROSCOPY WITH ROTATOR CUFF REPAIR AND SUBACROMIAL DECOMPRESSION  05/25/2012   Procedure: SHOULDER ARTHROSCOPY WITH ROTATOR CUFF REPAIR AND SUBACROMIAL DECOMPRESSION;  Surgeon: Lorn Junes, MD;  Location: [facility];  Service: Orthopedics;  Laterality: Right;  Right Shoulder Arthroscopy shoulder decompression subacromial partial acromioplasty with  coracoaromial release, arthroscopy shoulder distal claviculectomy, arthroscopy shoulder with rotator cuff repai    Current Medications: Current Meds  Medication Sig   ALPRAZolam (XANAX) 0.5 MG tablet TAKE 1 TABLET BY MOUTH DAILY AS NEEDED FOR ANXIETY (Patient taking differently: Take 0.5 mg by mouth daily as needed for anxiety.)   apixaban (ELIQUIS) 2.5 MG TABS tablet Take 1 tablet (2.5 mg total) by mouth 2 (two) times daily.   atorvastatin (LIPITOR) 20 MG tablet Take 1 tablet (20 mg total) by mouth daily.   blood glucose meter kit and supplies KIT Use to test blood sugar once daily. DX: E11.9   Blood Glucose Monitoring Suppl (FREESTYLE LITE) DEVI    Cholecalciferol (VITAMIN D-3) 25 MCG (1000 UT) CAPS Take 1,000 Units by mouth daily.   Continuous Blood Gluc Receiver (FREESTYLE LIBRE 2 READER) DEVI USE AS DIRECTED   Continuous Blood Gluc Sensor (FREESTYLE LIBRE 2 SENSOR) MISC USE AS DIRECTED (Patient taking differently: Inject 1 Device into the skin every 14 (fourteen) days.)   diclofenac Sodium (VOLTAREN) 1 % GEL Apply 4 g topically 4 (four) times daily.   glucose blood (FREESTYLE LITE) test strip USE TO CHECK BLOOD SUGAR ONCE DAILY   insulin glargine-yfgn (SEMGLEE, YFGN,) 100 UNIT/ML SOPN Inject 10 units into the skin daily.   L-Methylfolate-Algae-B12-B6 3-90.314-2-35 MG CAPS Take 1 capsule by mouth 2 (two) times daily.   meclizine (ANTIVERT) 12.5 MG tablet Take 1 tablet (12.5 mg total) by  mouth 3 (three) times daily as needed for dizziness.   meloxicam (MOBIC) 7.5 MG tablet Take 1 tablet (7.5 mg total) by mouth daily with meals   Multiple Vitamins-Minerals (CENTRUM SILVER PO) Take 1 tablet by mouth daily with breakfast.   pantoprazole (PROTONIX) 40 MG tablet Take 1 tablet (40 mg total) by mouth daily.   Semaglutide,0.25 or 0.5MG/DOS, (OZEMPIC, 0.25 OR 0.5 MG/DOSE,) 2 MG/1.5ML SOPN Inject 0.5 mg into the skin once a week on Sundays (Patient taking differently: Inject 0.5 mg into the skin every  Sunday.)   tamsulosin (FLOMAX) 0.4 MG CAPS capsule TAKE 1 CAPSULE BY MOUTH DAILY. (Patient taking differently: Take 0.4 mg by mouth at bedtime.)     Allergies:   Lisinopril   Social History   Socioeconomic History   Marital status: Married    Spouse name: linda Tidwell   Number of children: Not on file   Years of education: Not on file   Highest education level: Not on file  Occupational History   Occupation: works for security full time    Employer: [employer]  Tobacco Use   Smoking status: Never   Smokeless tobacco: Never  Vaping Use   Vaping Use: Never used  Substance and Sexual Activity   Alcohol use: No   Drug use: No   Sexual activity: Yes  Other Topics Concern   Not on file  Social History Narrative   One story home   Right handed   Works for security at [employer]       One son   Social Determinants of Radio broadcast assistant Strain: Low Risk    Difficulty of Paying Living Expenses: Not hard at all  Food Insecurity: No Food Insecurity   Worried About Charity fundraiser in the Last Year: Never true   Arboriculturist in the Last Year: Never true  Transportation Needs: No Transportation Needs   Lack of Transportation (Medical): No   Lack of Transportation (Non-Medical): No  Physical Activity: Not on file  Stress: Not on file  Social Connections: Not on file     Family History: The patient's family history includes Emphysema in an other family member; Heart disease in his father. There is no history of Colon cancer.  ROS:   Please see the history of present illness.    Follows with Dr. Scarlette Calico.  He will be seeing someone relative to chronic kidney disease.  Also has left foot drop all other systems reviewed and are negative.  EKGs/Labs/Other Studies Reviewed:    The following studies were reviewed today: Echocardiogram 2021: IMPRESSIONS     1. Left ventricular ejection fraction, by estimation, is 60 to 65%. The  left ventricle has  normal function. The left ventricle has no regional  wall motion abnormalities. There is moderate left ventricular hypertrophy.  Left ventricular diastolic  parameters are consistent with Grade I diastolic dysfunction (impaired  relaxation). GLS -23.2%.   2. Right ventricular systolic function is normal. The right ventricular  size is normal. Tricuspid regurgitation signal is inadequate for assessing  PA pressure.   3. The mitral valve is normal in structure. Trivial mitral valve  regurgitation. No evidence of mitral stenosis.   4. The aortic valve is tricuspid. Aortic valve regurgitation is not  visualized. Mild aortic valve sclerosis is present, with no evidence of  aortic valve stenosis.   5. The inferior vena cava is normal in size with greater than 50%  respiratory variability, suggesting  right atrial pressure of 3 mmHg.   EKG:  EKG performed on 05/14/2021 demonstrated normal sinus rhythm, left axis deviation, prominent early R wave progression with reversal in precordial leads.  Recent Labs: 05/14/2021: ALT 25; BUN 21; Creatinine, Ser 1.78; Hemoglobin 12.3; Platelets 206; Potassium 5.0; Sodium 137; TSH 2.846  Recent Lipid Panel    Component Value Date/Time   CHOL 127 03/18/2021 1611   TRIG 188.0 (H) 03/18/2021 1611   HDL 45.10 03/18/2021 1611   CHOLHDL 3 03/18/2021 1611   VLDL 37.6 03/18/2021 1611   LDLCALC 45 03/18/2021 1611   LDLCALC 72 01/04/2020 0933    Physical Exam:    VS:  BP 138/72   Pulse 66   Ht '5\' 11"'  (1.803 m)   Wt 161 lb (73 kg)   BMI 22.45 kg/m     Wt Readings from Last 3 Encounters:  05/21/21 161 lb (73 kg)  03/18/21 162 lb (73.5 kg)  01/23/21 162 lb (73.5 kg)     GEN: Slender. No acute distress HEENT: Normal NECK: No JVD. LYMPHATICS: No lymphadenopathy CARDIAC: No murmur. RRR no gallop, or edema. VASCULAR:  Normal Pulses. No bruits. RESPIRATORY:  Clear to auscultation without rales, wheezing or rhonchi  ABDOMEN: Soft, non-tender,  non-distended, No pulsatile mass, MUSCULOSKELETAL: No deformity  SKIN: Warm and dry NEUROLOGIC:  Alert and oriented x 3 PSYCHIATRIC:  Normal affect   ASSESSMENT:    1. Atrial fibrillation by electrocardiogram ([place])   2. On continuous oral anticoagulation   3. Stage 3 chronic kidney disease, unspecified whether stage 3a or 3b CKD ([place])   4. Type II diabetes mellitus with manifestations ([place])   5. Essential hypertension   6. Hyperlipidemia with target LDL less than 100    PLAN:    In order of problems listed above:  History of paroxysmal atrial fibrillation.  Continues in normal sinus rhythm based on today's exam.  He is no longer on metoprolol.  We will simply follow and not resume this medication unless we have persistent A. fib with increased rate.  Continue apixaban therapy at reduced dose related to age and kidney function. Apixaban at reduced dose.  Needs hemoglobin and creatinine twice per year. Nephrology follow-up Diabetes now being managed with therapy including insulin Blood pressure control is adequate on current regimen.  He needs to give [place] an up-to-date schedule of the medications that he is taking. Continue Lipitor 20 mg/day.  1 year follow-up.    Medication Adjustments/Labs and Tests Ordered: Current medicines are reviewed at length with the patient today.  Concerns regarding medicines are outlined above.  No orders of the defined types were placed in this encounter.  No orders of the defined types were placed in this encounter.   Patient Instructions  Medication Instructions:  Your physician recommends that you continue on your current medications as directed. Please refer to the Current Medication list given to you today.  *If you need a refill on your cardiac medications before your next appointment, please call your pharmacy*   Lab Work: None If you have labs (blood work) drawn today and your tests are completely normal, you will receive your results  only by: [place] (if you have MyChart) OR A paper copy in the mail If you have any lab test that is abnormal or we need to change your treatment, we will call you to review the results.   Testing/Procedures: None   Follow-Up: At [facility], you and your health needs are our priority.  As part of our continuing mission to provide you with exceptional heart care, we have created designated Provider Care Teams.  These Care Teams include your primary Cardiologist (physician) and Advanced Practice Providers (APPs -  Physician Assistants and Nurse Practitioners) who all work together to provide you with the care you need, when you need it.  We recommend signing up for the patient portal called "MyChart".  Sign up information is provided on this After Visit Summary.  MyChart is used to connect with patients for Virtual Visits (Telemedicine).  Patients are able to view lab/test results, encounter notes, upcoming appointments, etc.  Non-urgent messages can be sent to your provider as well.   To learn more about what you can do with MyChart, go to [email or web address].    Your next appointment:   1 year(s)  The format for your next appointment:   In Person  Provider:   Sinclair Grooms, MD     Other Instructions     Signed, Sinclair Grooms, MD  05/21/2021 8:28 AM    [place]

## 2021-05-20 ENCOUNTER — Telehealth: Payer: Self-pay | Admitting: Internal Medicine

## 2021-05-20 NOTE — Telephone Encounter (Signed)
Kirsten from [facility] has called and states that pt. Has 2 renal lesions on kidney. Requesting further ultrasound and characterization. Suspected renal cancer. Would like for assistant to follow up to give warning for visit on 12.1.2022. Also, would like to give update to MD.    Callback #- [phone]

## 2021-05-20 NOTE — Telephone Encounter (Signed)
Raliegh Ip called again to verify if the testing could be order. Relayed the message had been sent over to Dr. Ronnald Ramp and once we hear back from him we would follow up with the patient.

## 2021-05-21 ENCOUNTER — Other Ambulatory Visit: Payer: Self-pay

## 2021-05-21 ENCOUNTER — Encounter: Payer: Self-pay | Admitting: Interventional Cardiology

## 2021-05-21 ENCOUNTER — Ambulatory Visit (INDEPENDENT_AMBULATORY_CARE_PROVIDER_SITE_OTHER): Payer: 59 | Admitting: Interventional Cardiology

## 2021-05-21 VITALS — BP 138/72 | HR 66 | Ht 71.0 in | Wt 161.0 lb

## 2021-05-21 DIAGNOSIS — I4891 Unspecified atrial fibrillation: Secondary | ICD-10-CM | POA: Diagnosis not present

## 2021-05-21 DIAGNOSIS — E118 Type 2 diabetes mellitus with unspecified complications: Secondary | ICD-10-CM | POA: Diagnosis not present

## 2021-05-21 DIAGNOSIS — N183 Chronic kidney disease, stage 3 unspecified: Secondary | ICD-10-CM

## 2021-05-21 DIAGNOSIS — I10 Essential (primary) hypertension: Secondary | ICD-10-CM | POA: Diagnosis not present

## 2021-05-21 DIAGNOSIS — Z7901 Long term (current) use of anticoagulants: Secondary | ICD-10-CM

## 2021-05-21 DIAGNOSIS — E785 Hyperlipidemia, unspecified: Secondary | ICD-10-CM

## 2021-05-21 NOTE — Patient Instructions (Signed)

## 2021-05-22 ENCOUNTER — Encounter: Payer: Self-pay | Admitting: Internal Medicine

## 2021-05-22 DIAGNOSIS — M5126 Other intervertebral disc displacement, lumbar region: Secondary | ICD-10-CM | POA: Diagnosis not present

## 2021-05-22 DIAGNOSIS — R030 Elevated blood-pressure reading, without diagnosis of hypertension: Secondary | ICD-10-CM | POA: Diagnosis not present

## 2021-05-22 DIAGNOSIS — M21372 Foot drop, left foot: Secondary | ICD-10-CM | POA: Diagnosis not present

## 2021-05-22 NOTE — Progress Notes (Signed)
Subjective:    Patient ID: Dale Anderson, male    DOB: [birth year], 87 y.o.   MRN: [id number]  This visit occurred during the SARS-CoV-2 public health emergency.  Safety protocols were in place, including screening questions prior to the visit, additional usage of staff PPE, and extensive cleaning of exam room while observing appropriate contact time as indicated for disinfecting solutions.     HPI The patient is here for follow up from the ED.  1/22 - went to ED for weakness and dizziness.    For the past 4-5 days has had ongoing dizziness.  It is a room spinning, worse with laying flat.  Symptoms better with rest.  Difficulty ambulating due to dizziness.  He has chronic weakness in his right leg.  He also stated several episodes of diarrhea and a few episodes of vomiting.    In ED - VSS, neuro exam at baseline.  Ct head neg. For acute changes,, chronic infarct  MRI/A of brain - no acute abn.  Cr at baseline. Other las normal. UA neg.  Covid and flu testing neg. Abdomen non-tender.    Symptoms likely gastroenteritis.  He received zofran and meclizine with improvement in symptoms.  Discharged home with meclizine and zofran prescriptions. Advised increased fluids.   He would like to see a kidney doctor.  He drinks a good amount of water.  After looking in his chart he has actually already seen a kidney doctor and he is due for his yearly follow-up  He is having surgery Dr Reatha Armour next week.  He states the symptoms he went to the emergency room have resolved and he feels good overall.     Medications and allergies reviewed with patient and updated if appropriate.  Patient Active Problem List   Diagnosis Date Noted   Rotator cuff insufficiency of left shoulder 12/28/2020   Encounter for general adult medical examination with abnormal findings 07/18/2020   Atrial fibrillation by electrocardiogram ([place]) 01/12/2020   Murmur, cardiac 01/04/2020   Bradycardia 01/04/2020    Internal hemorrhoid 12/08/2019   Tinea cruris 12/05/2019   Seborrheic dermatitis of scalp 05/05/2019   Diabetic polyneuropathy associated with type 2 diabetes mellitus ([place]) 03/29/2019   Seasonal allergic rhinitis due to pollen 05/24/2018   Sciatica 10/21/2017   Chronic renal disease, stage 3, moderately decreased glomerular filtration rate (GFR) between 30-59 mL/min/1.73 square meter ([place]) 07/02/2017   Routine general medical examination at a health care facility 12/26/2013   DDD (degenerative disc disease), cervical 12/26/2013   Hypertension    Hyperlipidemia with target LDL less than 100    Type II diabetes mellitus with manifestations (HCC)    GERD (gastroesophageal reflux disease)    DJD (degenerative joint disease)    Anxiety     Current Outpatient Medications on File Prior to Visit  Medication Sig Dispense Refill   ALPRAZolam (XANAX) 0.5 MG tablet TAKE 1 TABLET BY MOUTH DAILY AS NEEDED FOR ANXIETY (Patient taking differently: Take 0.5 mg by mouth daily as needed for anxiety.) 30 tablet 3   apixaban (ELIQUIS) 2.5 MG TABS tablet Take 1 tablet (2.5 mg total) by mouth 2 (two) times daily. 180 tablet 1   atorvastatin (LIPITOR) 20 MG tablet Take 1 tablet (20 mg total) by mouth daily. 90 tablet 1   blood glucose meter kit and supplies KIT Use to test blood sugar once daily. DX: E11.9 1 each 0   Blood Glucose Monitoring Suppl (FREESTYLE LITE) DEVI   0  Cholecalciferol (VITAMIN D-3) 25 MCG (1000 UT) CAPS Take 1,000 Units by mouth daily.     Continuous Blood Gluc Receiver (FREESTYLE LIBRE 2 READER) DEVI USE AS DIRECTED 2 each 5   Continuous Blood Gluc Sensor (FREESTYLE LIBRE 2 SENSOR) MISC USE AS DIRECTED (Patient taking differently: Inject 1 Device into the skin every 14 (fourteen) days.) 2 each 5   diclofenac Sodium (VOLTAREN) 1 % GEL Apply 4 g topically 4 (four) times daily. 100 g 0   glucose blood (FREESTYLE LITE) test strip USE TO CHECK BLOOD SUGAR ONCE DAILY 100 strip 1   insulin  glargine-yfgn (SEMGLEE, YFGN,) 100 UNIT/ML SOPN Inject 10 units into the skin daily. 9 mL 3   L-Methylfolate-Algae-B12-B6 3-90.314-2-35 MG CAPS Take 1 capsule by mouth 2 (two) times daily. 60 capsule 5   Lancets MISC Use to test blood sugar once daily. DX E11.09 100 each 3   lidocaine (LIDODERM) 5 % Place 1 patch onto the skin daily. Remove & Discard patch within 12 hours or as directed by MD 30 patch 0   lisinopril (ZESTRIL) 40 MG tablet Take 0.5 tablets by mouth daily.     meclizine (ANTIVERT) 12.5 MG tablet Take 1 tablet (12.5 mg total) by mouth 3 (three) times daily as needed for dizziness. 30 tablet 0   meloxicam (MOBIC) 7.5 MG tablet Take 1 tablet (7.5 mg total) by mouth daily with meals 30 tablet 2   metoprolol succinate (TOPROL-XL) 25 MG 24 hr tablet Take 1 tablet (25 mg total) by mouth daily. Needs office visit. 30 tablet 0   Multiple Vitamins-Minerals (CENTRUM SILVER PO) Take 1 tablet by mouth daily with breakfast.     ondansetron (ZOFRAN) 4 MG tablet Take 1 tablet (4 mg total) by mouth every 6 (six) hours. 12 tablet 0   pantoprazole (PROTONIX) 40 MG tablet Take 1 tablet (40 mg total) by mouth daily. 90 tablet 1   Semaglutide,0.25 or 0.5MG/DOS, (OZEMPIC, 0.25 OR 0.5 MG/DOSE,) 2 MG/1.5ML SOPN Inject 0.5 mg into the skin once a week on Sundays (Patient taking differently: Inject 0.5 mg into the skin every Sunday.) 1.5 mL 2   tamsulosin (FLOMAX) 0.4 MG CAPS capsule TAKE 1 CAPSULE BY MOUTH DAILY. (Patient taking differently: Take 0.4 mg by mouth at bedtime.) 90 capsule 1   [DISCONTINUED] telmisartan (MICARDIS) 40 MG tablet TAKE 1 TABLET BY MOUTH DAILY. 90 tablet 1   No current facility-administered medications on file prior to visit.    Past Medical History:  Diagnosis Date   Adhesive capsulitis of right shoulder    Anxiety    Diabetes mellitus (HCC)    DJD (degenerative joint disease)    GERD (gastroesophageal reflux disease)    Hemorrhoid    History of shingles    Hyperlipidemia     Hypertension    Nodular prostate without urinary obstruction    Recurrent bronchiectasis (HCC)    Tear of right rotator cuff     Past Surgical History:  Procedure Laterality Date   COLONOSCOPY  1998   HAND RECONSTRUCTION     right   INGUINAL HERNIA REPAIR  2006   Right.  Dr. Bubba Camp   ROTATOR CUFF REPAIR  1995   Left.  Dr. Wonda Olds   SHOULDER ARTHROSCOPY WITH ROTATOR CUFF REPAIR AND SUBACROMIAL DECOMPRESSION  05/25/2012   Procedure: SHOULDER ARTHROSCOPY WITH ROTATOR CUFF REPAIR AND SUBACROMIAL DECOMPRESSION;  Surgeon: Lorn Junes, MD;  Location: [facility];  Service: Orthopedics;  Laterality: Right;  Right Shoulder Arthroscopy shoulder  decompression subacromial partial acromioplasty with coracoaromial release, arthroscopy shoulder distal claviculectomy, arthroscopy shoulder with rotator cuff repai    Social History   Socioeconomic History   Marital status: Married    Spouse name: linda Sledge   Number of children: Not on file   Years of education: Not on file   Highest education level: Not on file  Occupational History   Occupation: works for security full time    Employer: [employer]  Tobacco Use   Smoking status: Never   Smokeless tobacco: Never  Vaping Use   Vaping Use: Never used  Substance and Sexual Activity   Alcohol use: No   Drug use: No   Sexual activity: Yes  Other Topics Concern   Not on file  Social History Narrative   One story home   Right handed   Works for security at [employer]       One son   Social Determinants of Radio broadcast assistant Strain: Low Risk    Difficulty of Paying Living Expenses: Not hard at all  Food Insecurity: No Food Insecurity   Worried About Charity fundraiser in the Last Year: Never true   Arboriculturist in the Last Year: Never true  Transportation Needs: No Transportation Needs   Lack of Transportation (Medical): No   Lack of Transportation (Non-Medical): No  Physical Activity: Not on file   Stress: Not on file  Social Connections: Not on file    Family History  Problem Relation Age of Onset   Heart disease Father        MI at age 69   Emphysema Other        cousin   Colon cancer Neg Hx     Review of Systems  Constitutional:  Negative for fever.  Respiratory:  Negative for cough, shortness of breath and wheezing.   Cardiovascular:  Negative for chest pain, palpitations and leg swelling.  Gastrointestinal:  Positive for diarrhea (intermittent x 1 month - comes on quick). Negative for abdominal pain, blood in stool and nausea.  Neurological:  Negative for dizziness, light-headedness and headaches.      Objective:   Vitals:   05/23/21 1010  BP: 120/64  Pulse: 70  Resp: 16  Temp: 98.2 F (36.8 C)   BP Readings from Last 3 Encounters:  05/23/21 120/64  05/21/21 138/72  05/15/21 (!) 151/67   Wt Readings from Last 3 Encounters:  05/23/21 160 lb 12.8 oz (72.9 kg)  05/21/21 161 lb (73 kg)  03/18/21 162 lb (73.5 kg)   Body mass index is 22.43 kg/m.   Physical Exam    Constitutional: Appears well-developed and well-nourished. No distress.  HENT:  Head: Normocephalic and atraumatic.  Neck: Neck supple. No tracheal deviation present. No thyromegaly present.  No cervical lymphadenopathy Cardiovascular: Normal rate, regular rhythm and normal heart sounds.   No murmur heard. No carotid bruit .  No edema Pulmonary/Chest: Effort normal and breath sounds normal. No respiratory distress. No has no wheezes. No rales. Abdomen: Soft, nontender, nondistended Skin: Skin is warm and dry. Not diaphoretic.  Psychiatric: Normal mood and affect. Behavior is normal.   Lab Results  Component Value Date   WBC 6.3 05/14/2021   HGB 12.3 (L) 05/14/2021   HCT 37.2 (L) 05/14/2021   PLT 206 05/14/2021   GLUCOSE 146 (H) 05/14/2021   CHOL 127 03/18/2021   TRIG 188.0 (H) 03/18/2021   HDL 45.10 03/18/2021   LDLCALC 45 03/18/2021  ALT 25 05/14/2021   AST 25 05/14/2021   NA  137 05/14/2021   K 5.0 05/14/2021   CL 105 05/14/2021   CREATININE 1.78 (H) 05/14/2021   BUN 21 05/14/2021   CO2 25 05/14/2021   TSH 2.846 05/14/2021   PSA 2.55 08/21/2015   INR 1.0 05/14/2021   HGBA1C 7.1 (H) 03/18/2021   MICROALBUR 10.0 (H) 10/17/2020   MR Angiogram Neck W or Wo Contrast CLINICAL DATA:  Dizziness, persistent or recurrent, stroke suspected  EXAM: MRI HEAD WITHOUT AND WITH CONTRAST  MRA HEAD WITHOUT CONTRAST  MRA NECK WITHOUT AND WITH CONTRAST  TECHNIQUE: Multiplanar, multi-echo pulse sequences of the brain and surrounding structures were acquired without and with intravenous contrast. Angiographic images of the Circle of Willis were acquired using MRA technique without intravenous contrast. Angiographic images of the neck were acquired using MRA technique without and with intravenous contrast. Carotid stenosis measurements (when applicable) are obtained utilizing NASCET criteria, using the distal internal carotid diameter as the denominator.  CONTRAST:  7.45m GADAVIST GADOBUTROL 1 MMOL/ML IV SOLN  COMPARISON:  No prior MRI, correlation is made with CT head 05/14/2021  FINDINGS: MRI HEAD FINDINGS  Brain: No restricted diffusion to suggest acute or subacute infarction. No acute hemorrhage, mass, mass effect, or midline shift. No abnormal parenchymal enhancement. Lacunar infarct in the left corona radiata. Scattered T2 hyperintense signal in the periventricular white matter, likely the sequela of moderate chronic small vessel ischemic disease.  Vascular: See MRA findings, below.  Skull and upper cervical spine: Normal marrow signal. Degenerative changes in the cervical spine.  Sinuses/Orbits: Mucosal thickening right maxillary sinus. Otherwise negative.  Other: The mastoids are well aerated.  MRA HEAD FINDINGS  Anterior circulation: Both internal carotid arteries are patent to the termini, without stenosis or other abnormality. A1  segments patent. Normal anterior communicating artery. Anterior cerebral arteries are patent to their distal aspects. No M1 stenosis or occlusion. Normal MCA bifurcations. Distal MCA branches perfused and symmetric.  Posterior circulation: Vertebral arteries patent to the vertebrobasilar junction without stenosis. Posterior inferior cerebral arteries patent bilaterally. Basilar patent to its distal aspect. Superior cerebellar arteries patent bilaterally. PCAs perfused to their distal aspects without stenosis. The bilateral posterior communicating arteries are not visualized.  Anatomic variants: None significant  MRA NECK FINDINGS  Aortic arch: Normal aortic branching. No evidence of stenosis, aneurysm, or dissection.  Right carotid system: Patent. No evidence of stenosis, aneurysm, or dissection.  Left carotid system: Patent. No evidence of stenosis, aneurysm, or dissection.  Vertebral arteries: Patent.  No evidence of stenosis or dissection.  Other: None  IMPRESSION: 1. No acute intracranial process. 2. No hemodynamically significant stenosis in the neck. 3. No intracranial large vessel occlusion or stenosis.  Electronically Signed   By: AMerilyn BabaM.D.   On: 05/14/2021 23:41 MR Brain W and Wo Contrast CLINICAL DATA:  Dizziness, persistent or recurrent, stroke suspected  EXAM: MRI HEAD WITHOUT AND WITH CONTRAST  MRA HEAD WITHOUT CONTRAST  MRA NECK WITHOUT AND WITH CONTRAST  TECHNIQUE: Multiplanar, multi-echo pulse sequences of the brain and surrounding structures were acquired without and with intravenous contrast. Angiographic images of the Circle of Willis were acquired using MRA technique without intravenous contrast. Angiographic images of the neck were acquired using MRA technique without and with intravenous contrast. Carotid stenosis measurements (when applicable) are obtained utilizing NASCET criteria, using the distal internal carotid diameter as  the denominator.  CONTRAST:  7.557mGADAVIST GADOBUTROL 1 MMOL/ML IV SOLN  COMPARISON:  No prior MRI, correlation is made with CT head 05/14/2021  FINDINGS: MRI HEAD FINDINGS  Brain: No restricted diffusion to suggest acute or subacute infarction. No acute hemorrhage, mass, mass effect, or midline shift. No abnormal parenchymal enhancement. Lacunar infarct in the left corona radiata. Scattered T2 hyperintense signal in the periventricular white matter, likely the sequela of moderate chronic small vessel ischemic disease.  Vascular: See MRA findings, below.  Skull and upper cervical spine: Normal marrow signal. Degenerative changes in the cervical spine.  Sinuses/Orbits: Mucosal thickening right maxillary sinus. Otherwise negative.  Other: The mastoids are well aerated.  MRA HEAD FINDINGS  Anterior circulation: Both internal carotid arteries are patent to the termini, without stenosis or other abnormality. A1 segments patent. Normal anterior communicating artery. Anterior cerebral arteries are patent to their distal aspects. No M1 stenosis or occlusion. Normal MCA bifurcations. Distal MCA branches perfused and symmetric.  Posterior circulation: Vertebral arteries patent to the vertebrobasilar junction without stenosis. Posterior inferior cerebral arteries patent bilaterally. Basilar patent to its distal aspect. Superior cerebellar arteries patent bilaterally. PCAs perfused to their distal aspects without stenosis. The bilateral posterior communicating arteries are not visualized.  Anatomic variants: None significant  MRA NECK FINDINGS  Aortic arch: Normal aortic branching. No evidence of stenosis, aneurysm, or dissection.  Right carotid system: Patent. No evidence of stenosis, aneurysm, or dissection.  Left carotid system: Patent. No evidence of stenosis, aneurysm, or dissection.  Vertebral arteries: Patent.  No evidence of stenosis or dissection.  Other:  None  IMPRESSION: 1. No acute intracranial process. 2. No hemodynamically significant stenosis in the neck. 3. No intracranial large vessel occlusion or stenosis.  Electronically Signed   By: Merilyn Baba M.D.   On: 05/14/2021 23:41 MR ANGIO HEAD WO CONTRAST CLINICAL DATA:  Dizziness, persistent or recurrent, stroke suspected  EXAM: MRI HEAD WITHOUT AND WITH CONTRAST  MRA HEAD WITHOUT CONTRAST  MRA NECK WITHOUT AND WITH CONTRAST  TECHNIQUE: Multiplanar, multi-echo pulse sequences of the brain and surrounding structures were acquired without and with intravenous contrast. Angiographic images of the Circle of Willis were acquired using MRA technique without intravenous contrast. Angiographic images of the neck were acquired using MRA technique without and with intravenous contrast. Carotid stenosis measurements (when applicable) are obtained utilizing NASCET criteria, using the distal internal carotid diameter as the denominator.  CONTRAST:  7.49m GADAVIST GADOBUTROL 1 MMOL/ML IV SOLN  COMPARISON:  No prior MRI, correlation is made with CT head 05/14/2021  FINDINGS: MRI HEAD FINDINGS  Brain: No restricted diffusion to suggest acute or subacute infarction. No acute hemorrhage, mass, mass effect, or midline shift. No abnormal parenchymal enhancement. Lacunar infarct in the left corona radiata. Scattered T2 hyperintense signal in the periventricular white matter, likely the sequela of moderate chronic small vessel ischemic disease.  Vascular: See MRA findings, below.  Skull and upper cervical spine: Normal marrow signal. Degenerative changes in the cervical spine.  Sinuses/Orbits: Mucosal thickening right maxillary sinus. Otherwise negative.  Other: The mastoids are well aerated.  MRA HEAD FINDINGS  Anterior circulation: Both internal carotid arteries are patent to the termini, without stenosis or other abnormality. A1 segments patent. Normal anterior  communicating artery. Anterior cerebral arteries are patent to their distal aspects. No M1 stenosis or occlusion. Normal MCA bifurcations. Distal MCA branches perfused and symmetric.  Posterior circulation: Vertebral arteries patent to the vertebrobasilar junction without stenosis. Posterior inferior cerebral arteries patent bilaterally. Basilar patent to its distal aspect. Superior cerebellar arteries patent bilaterally. PCAs perfused to their distal  aspects without stenosis. The bilateral posterior communicating arteries are not visualized.  Anatomic variants: None significant  MRA NECK FINDINGS  Aortic arch: Normal aortic branching. No evidence of stenosis, aneurysm, or dissection.  Right carotid system: Patent. No evidence of stenosis, aneurysm, or dissection.  Left carotid system: Patent. No evidence of stenosis, aneurysm, or dissection.  Vertebral arteries: Patent.  No evidence of stenosis or dissection.  Other: None  IMPRESSION: 1. No acute intracranial process. 2. No hemodynamically significant stenosis in the neck. 3. No intracranial large vessel occlusion or stenosis.  Electronically Signed   By: Merilyn Baba M.D.   On: 05/14/2021 23:41 CT HEAD WO CONTRAST (5MM) CLINICAL DATA:  Neuro deficit, acute, stroke suspected. Additional history provided: Weakness and worsening dizziness for 3 days.  EXAM: CT HEAD WITHOUT CONTRAST  TECHNIQUE: Contiguous axial images were obtained from the base of the skull through the vertex without intravenous contrast.  COMPARISON:  Head CT 07/23/2020.  FINDINGS: Brain:  Mild-to-moderate for age generalized cerebral atrophy. Comparatively mild cerebellar atrophy.  Redemonstrated chronic lacunar infarct within the left corona radiata.  There is no acute intracranial hemorrhage.  No demarcated cortical infarct.  No extra-axial fluid collection.  No evidence of an intracranial mass.  No midline shift.  Vascular: No  hyperdense vessel.  Atherosclerotic calcifications.  Skull: Normal. Negative for fracture or focal lesion.  Sinuses/Orbits: Visualized orbits show no acute finding. Minimal mucosal thickening within the bilateral ethmoid and sphenoid sinuses.  IMPRESSION: No evidence of acute intracranial abnormality.  Redemonstrated chronic lacunar infarct within the left corona radiata.  Cerebral and cerebellar atrophy, as described.  Minimal bilateral ethmoid and sphenoid sinus mucosal thickening.  Electronically Signed   By: Kellie Simmering D.O.   On: 05/14/2021 18:31    Assessment & Plan:    Gastroenteritis, viral: Acute Resolved Symptoms have completely resolved since his emergency room visit Continue increased fluids  Chronic kidney disease: Chronic He initially said the only reason he was here was to be referred to a kidney doctor, but after looking through his chart he has seen the kidney doctor and last saw him December 2021-he is due for follow-up and if he does not have one for early next year he will go ahead and call them and schedule I Reassured his kidney function has been overall stable-he did have recent dehydration related to gastroenteritis so his kidney function was slightly worse at that time Continue increased fluids Blood pressure well controlled Does not take any NSAIDs  Hypertension: Chronic Blood pressure well controlled Continue lisinopril 20 mg daily, metoprolol XL 25 mg daily

## 2021-05-23 ENCOUNTER — Telehealth: Payer: Self-pay | Admitting: *Deleted

## 2021-05-23 ENCOUNTER — Other Ambulatory Visit: Payer: Self-pay

## 2021-05-23 ENCOUNTER — Other Ambulatory Visit: Payer: Self-pay | Admitting: Neurological Surgery

## 2021-05-23 ENCOUNTER — Ambulatory Visit (INDEPENDENT_AMBULATORY_CARE_PROVIDER_SITE_OTHER): Payer: 59 | Admitting: Internal Medicine

## 2021-05-23 VITALS — BP 120/64 | HR 70 | Temp 98.2°F | Resp 16 | Ht 71.0 in | Wt 160.8 lb

## 2021-05-23 DIAGNOSIS — N1832 Chronic kidney disease, stage 3b: Secondary | ICD-10-CM

## 2021-05-23 DIAGNOSIS — K529 Noninfective gastroenteritis and colitis, unspecified: Secondary | ICD-10-CM | POA: Diagnosis not present

## 2021-05-23 DIAGNOSIS — I10 Essential (primary) hypertension: Secondary | ICD-10-CM | POA: Diagnosis not present

## 2021-05-23 NOTE — Telephone Encounter (Signed)
    Patient Name: Dale Anderson  DOB: 23-Feb-1930 MRN: [id number]  Primary Cardiologist: Sinclair Grooms, MD  Chart reviewed as part of pre-operative protocol coverage. Patient was seen by Dr. Tamala Julian earlier this week on 05/21/2021 at which time he was doing well from a cardiac standpoint. He had had a recent ED visit to for dizziness which was felt to be due to vertigo - symptoms improved with Meclizine and EKG showed sinus rhythm despite him being dizzy at the time. Otherwise, asymptomatic from a cardiac standpoint. He was advised to follow-up in 1 year. Given past medical history and time since last visit, based on ACC/AHA guidelines, Davy Westmoreland Mccartney would be at acceptable risk for the planned procedure without further cardiovascular testing.   Per Pharmacy and office protocol, patient can hold Eliquis for 3 days prior to procedure. Please restart this as soon as safely possible afterwards.   I will route this recommendation to the requesting party via Epic fax function and remove from pre-op pool.  Please call with questions.  Darreld Mclean, PA-C 05/23/2021, 7:23 PM

## 2021-05-23 NOTE — Telephone Encounter (Signed)
Pharmacy, can you please comment on how long Eliquis can be held?  Thank you!

## 2021-05-23 NOTE — Telephone Encounter (Signed)
   Pre-operative Risk Assessment    Patient Name: Dale Anderson  DOB: 06-21-1930 MRN: [id number]      Request for Surgical Clearance   Procedure:   LUMBAR MICRODISCECTOMY  Date of Surgery: Clearance 05/27/21                                 Surgeon:  DR. Elwin Sleight Surgeon's Group or Practice Name:  [facility] Phone number:  [phone] Fax number:  [phone] ATTN [id number]   Type of Clearance Requested: - Medical  - Pharmacy:  Hold Apixaban (Eliquis)     Type of Anesthesia:   General    Additional requests/questions:   Jiles Prows   05/23/2021, 11:00 AM

## 2021-05-23 NOTE — Telephone Encounter (Signed)
Patient with diagnosis of afib on Eliquis for anticoagulation.    Procedure:  LUMBAR MICRODISCECTOMY Date of procedure: 05/27/21   CHA2DS2-VASc Score = 4   This indicates a 4.8% annual risk of stroke. The patient's score is based upon: CHF History: 0 HTN History: 1 Diabetes History: 1 Stroke History: 0 Vascular Disease History: 0 Age Score: 2 Gender Score: 0      CrCl 29 ml/min  Per office protocol, patient can hold Eliquis for 3 days prior to procedure.

## 2021-05-23 NOTE — Patient Instructions (Addendum)
     Medications changes include :   none    Follow up with your kidney doctor [phone]

## 2021-05-24 ENCOUNTER — Encounter (HOSPITAL_COMMUNITY): Payer: Self-pay | Admitting: Neurological Surgery

## 2021-05-24 NOTE — Progress Notes (Signed)
Called Dr. Reatha Armour to get permission to have pt be able to have clear liquids up to 3 hours prior to surgery on Monday since pt's surgery is scheduled for 4:30 PM. He stated that it was fine as long as the Anesthesiologist is ok with it. We have a standing order from the anesthesiologists that patients may have clear liquids up to 3 hours prior to OR start time.

## 2021-05-24 NOTE — Progress Notes (Signed)
Attempted numerous times to call pt. Left messages but no return calls. Left pre-op instructions on pt's wife's cell phone. Home phone voicemail was full.

## 2021-05-27 ENCOUNTER — Ambulatory Visit (HOSPITAL_COMMUNITY): Payer: 59 | Admitting: Certified Registered Nurse Anesthetist

## 2021-05-27 ENCOUNTER — Ambulatory Visit (HOSPITAL_COMMUNITY): Payer: 59

## 2021-05-27 ENCOUNTER — Ambulatory Visit (HOSPITAL_COMMUNITY)
Admission: RE | Admit: 2021-05-27 | Discharge: 2021-05-27 | Disposition: A | Discharge status: Home / Self Care | Payer: 59 | Attending: Neurological Surgery | Admitting: Neurological Surgery

## 2021-05-27 ENCOUNTER — Other Ambulatory Visit: Payer: Self-pay

## 2021-05-27 ENCOUNTER — Encounter (HOSPITAL_COMMUNITY)
Admission: RE | Disposition: A | Discharge status: Home / Self Care | Payer: Self-pay | Source: Home / Self Care | Attending: Neurological Surgery

## 2021-05-27 ENCOUNTER — Other Ambulatory Visit (HOSPITAL_COMMUNITY): Payer: Self-pay

## 2021-05-27 ENCOUNTER — Encounter (HOSPITAL_COMMUNITY): Payer: Self-pay | Admitting: Neurological Surgery

## 2021-05-27 ENCOUNTER — Other Ambulatory Visit: Payer: Self-pay | Admitting: Internal Medicine

## 2021-05-27 DIAGNOSIS — M5116 Intervertebral disc disorders with radiculopathy, lumbar region: Secondary | ICD-10-CM | POA: Diagnosis not present

## 2021-05-27 DIAGNOSIS — M21372 Foot drop, left foot: Secondary | ICD-10-CM | POA: Insufficient documentation

## 2021-05-27 DIAGNOSIS — M4807 Spinal stenosis, lumbosacral region: Secondary | ICD-10-CM | POA: Diagnosis not present

## 2021-05-27 DIAGNOSIS — I358 Other nonrheumatic aortic valve disorders: Secondary | ICD-10-CM | POA: Diagnosis not present

## 2021-05-27 DIAGNOSIS — M5417 Radiculopathy, lumbosacral region: Secondary | ICD-10-CM | POA: Diagnosis not present

## 2021-05-27 DIAGNOSIS — E119 Type 2 diabetes mellitus without complications: Secondary | ICD-10-CM | POA: Insufficient documentation

## 2021-05-27 DIAGNOSIS — Z20822 Contact with and (suspected) exposure to covid-19: Secondary | ICD-10-CM | POA: Diagnosis not present

## 2021-05-27 DIAGNOSIS — I4891 Unspecified atrial fibrillation: Secondary | ICD-10-CM | POA: Diagnosis not present

## 2021-05-27 DIAGNOSIS — Z7901 Long term (current) use of anticoagulants: Secondary | ICD-10-CM | POA: Insufficient documentation

## 2021-05-27 DIAGNOSIS — Z419 Encounter for procedure for purposes other than remedying health state, unspecified: Secondary | ICD-10-CM

## 2021-05-27 DIAGNOSIS — M48061 Spinal stenosis, lumbar region without neurogenic claudication: Secondary | ICD-10-CM | POA: Diagnosis not present

## 2021-05-27 DIAGNOSIS — Z9889 Other specified postprocedural states: Secondary | ICD-10-CM | POA: Diagnosis not present

## 2021-05-27 DIAGNOSIS — K219 Gastro-esophageal reflux disease without esophagitis: Secondary | ICD-10-CM | POA: Diagnosis not present

## 2021-05-27 HISTORY — DX: Cardiac arrhythmia, unspecified: I49.9

## 2021-05-27 HISTORY — PX: LUMBAR LAMINECTOMY/DECOMPRESSION MICRODISCECTOMY: SHX5026

## 2021-05-27 LAB — SARS CORONAVIRUS 2 BY RT PCR (HOSPITAL ORDER, PERFORMED IN ~~LOC~~ HOSPITAL LAB): SARS Coronavirus 2: NEGATIVE

## 2021-05-27 LAB — GLUCOSE, CAPILLARY: Glucose-Capillary: 78 mg/dL (ref 70–99)

## 2021-05-27 LAB — SURGICAL PCR SCREEN
MRSA, PCR: NEGATIVE
Staphylococcus aureus: NEGATIVE

## 2021-05-27 SURGERY — LUMBAR LAMINECTOMY/DECOMPRESSION MICRODISCECTOMY 1 LEVEL
Anesthesia: General | Site: Spine Lumbar | Laterality: Left

## 2021-05-27 MED ORDER — FENTANYL CITRATE (PF) 250 MCG/5ML IJ SOLN
INTRAMUSCULAR | Status: DC | PRN
  Administered 2021-05-27: 50 ug via INTRAVENOUS
  Administered 2021-05-27: 100 ug via INTRAVENOUS
  Administered 2021-05-27: 50 ug via INTRAVENOUS

## 2021-05-27 MED ORDER — THROMBIN 5000 UNITS EX SOLR
CUTANEOUS | Status: AC
  Filled 2021-05-27: qty 10000

## 2021-05-27 MED ORDER — FENTANYL CITRATE (PF) 100 MCG/2ML IJ SOLN
25.0000 ug | INTRAMUSCULAR | Status: DC | PRN
Start: 2021-05-27 — End: 2021-05-28

## 2021-05-27 MED ORDER — ACETAMINOPHEN 10 MG/ML IV SOLN: 1000.0000 mg | Freq: Once | INTRAVENOUS | Status: DC | PRN

## 2021-05-27 MED ORDER — ONDANSETRON HCL 4 MG/2ML IJ SOLN
INTRAMUSCULAR | Status: DC | PRN
  Administered 2021-05-27: 4 mg via INTRAVENOUS

## 2021-05-27 MED ORDER — GELATIN ABSORBABLE 12-7 MM EX MISC
CUTANEOUS | Status: DC | PRN
  Administered 2021-05-27: 1

## 2021-05-27 MED ORDER — BUPIVACAINE-EPINEPHRINE (PF) 0.5% -1:200000 IJ SOLN
INTRAMUSCULAR | Status: DC | PRN
  Administered 2021-05-27: 2.5 mL

## 2021-05-27 MED ORDER — OXYCODONE HCL 5 MG PO TABS: 5.0000 mg | ORAL_TABLET | Freq: Once | ORAL | Status: DC | PRN

## 2021-05-27 MED ORDER — DEXAMETHASONE SODIUM PHOSPHATE 10 MG/ML IJ SOLN
INTRAMUSCULAR | Status: DC | PRN
  Administered 2021-05-27: 10 mg via INTRAVENOUS

## 2021-05-27 MED ORDER — ORAL CARE MOUTH RINSE: 15.0000 mL | Freq: Once | OROMUCOSAL | Status: AC

## 2021-05-27 MED ORDER — HYDROCODONE-ACETAMINOPHEN 5-325 MG PO TABS
1.0000 | ORAL_TABLET | Freq: Four times a day (QID) | ORAL | 0 refills | Status: DC | PRN
  Filled 2021-05-27: qty 30, 8d supply, fill #0

## 2021-05-27 MED ORDER — APIXABAN 2.5 MG PO TABS: 2.5000 mg | ORAL_TABLET | Freq: Two times a day (BID) | ORAL | 1 refills | Status: DC

## 2021-05-27 MED ORDER — FENTANYL CITRATE (PF) 100 MCG/2ML IJ SOLN
INTRAMUSCULAR | Status: DC | PRN
  Administered 2021-05-27: 50 ug via INTRAVENOUS

## 2021-05-27 MED ORDER — ACETAMINOPHEN 160 MG/5ML PO SOLN: 1000.0000 mg | Freq: Once | ORAL | Status: DC | PRN

## 2021-05-27 MED ORDER — DEXAMETHASONE SODIUM PHOSPHATE 10 MG/ML IJ SOLN
INTRAMUSCULAR | Status: AC
  Filled 2021-05-27: qty 1

## 2021-05-27 MED ORDER — SUGAMMADEX SODIUM 200 MG/2ML IV SOLN
INTRAVENOUS | Status: DC | PRN
  Administered 2021-05-27: 200 mg via INTRAVENOUS

## 2021-05-27 MED ORDER — METHOCARBAMOL 500 MG PO TABS
500.0000 mg | ORAL_TABLET | Freq: Three times a day (TID) | ORAL | 0 refills | Status: DC | PRN
  Filled 2021-05-27: qty 90, 30d supply, fill #0

## 2021-05-27 MED ORDER — CHLORHEXIDINE GLUCONATE 0.12 % MT SOLN: 15.0000 mL | Freq: Once | OROMUCOSAL | Status: AC

## 2021-05-27 MED ORDER — LIDOCAINE-EPINEPHRINE 1 %-1:100000 IJ SOLN
INTRAMUSCULAR | Status: AC
  Filled 2021-05-27: qty 1

## 2021-05-27 MED ORDER — FENTANYL CITRATE (PF) 250 MCG/5ML IJ SOLN
INTRAMUSCULAR | Status: AC
  Filled 2021-05-27: qty 5

## 2021-05-27 MED ORDER — METHYLPREDNISOLONE ACETATE 80 MG/ML IJ SUSP
INTRAMUSCULAR | Status: AC
  Filled 2021-05-27: qty 1

## 2021-05-27 MED ORDER — ONDANSETRON HCL 4 MG/2ML IJ SOLN
INTRAMUSCULAR | Status: AC
  Filled 2021-05-27: qty 2

## 2021-05-27 MED ORDER — THROMBIN 5000 UNITS EX SOLR
CUTANEOUS | Status: AC
  Filled 2021-05-27: qty 5000

## 2021-05-27 MED ORDER — LACTATED RINGERS IV SOLN: INTRAVENOUS | Status: DC

## 2021-05-27 MED ORDER — BUPIVACAINE-EPINEPHRINE 0.5% -1:200000 IJ SOLN
INTRAMUSCULAR | Status: AC
  Filled 2021-05-27: qty 1

## 2021-05-27 MED ORDER — CHLORHEXIDINE GLUCONATE CLOTH 2 % EX PADS: 6.0000 | MEDICATED_PAD | Freq: Once | CUTANEOUS | Status: DC

## 2021-05-27 MED ORDER — 0.9 % SODIUM CHLORIDE (POUR BTL) OPTIME
TOPICAL | Status: DC | PRN
  Administered 2021-05-27: 1000 mL

## 2021-05-27 MED ORDER — PROPOFOL 10 MG/ML IV BOLUS
INTRAVENOUS | Status: DC | PRN
  Administered 2021-05-27: 110 mg via INTRAVENOUS

## 2021-05-27 MED ORDER — CHLORHEXIDINE GLUCONATE 0.12 % MT SOLN
OROMUCOSAL | Status: AC
  Administered 2021-05-27: 15 mL via OROMUCOSAL
  Filled 2021-05-27: qty 15

## 2021-05-27 MED ORDER — PROPOFOL 10 MG/ML IV BOLUS
INTRAVENOUS | Status: AC
  Filled 2021-05-27: qty 20

## 2021-05-27 MED ORDER — THROMBIN 5000 UNITS EX SOLR
OROMUCOSAL | Status: DC | PRN
  Administered 2021-05-27: 5 mL via TOPICAL

## 2021-05-27 MED ORDER — ACETAMINOPHEN 500 MG PO TABS: 1000.0000 mg | ORAL_TABLET | Freq: Once | ORAL | Status: DC | PRN

## 2021-05-27 MED ORDER — OXYCODONE HCL 5 MG/5ML PO SOLN: 5.0000 mg | Freq: Once | ORAL | Status: DC | PRN

## 2021-05-27 MED ORDER — FENTANYL CITRATE (PF) 100 MCG/2ML IJ SOLN
INTRAMUSCULAR | Status: AC
  Filled 2021-05-27: qty 2

## 2021-05-27 MED ORDER — ROCURONIUM BROMIDE 10 MG/ML (PF) SYRINGE
PREFILLED_SYRINGE | INTRAVENOUS | Status: AC
  Filled 2021-05-27: qty 10

## 2021-05-27 MED ORDER — LIDOCAINE 2% (20 MG/ML) 5 ML SYRINGE
INTRAMUSCULAR | Status: DC | PRN
  Administered 2021-05-27: 60 mg via INTRAVENOUS

## 2021-05-27 MED ORDER — ROCURONIUM BROMIDE 10 MG/ML (PF) SYRINGE
PREFILLED_SYRINGE | INTRAVENOUS | Status: DC | PRN
  Administered 2021-05-27: 30 mg via INTRAVENOUS
  Administered 2021-05-27: 70 mg via INTRAVENOUS

## 2021-05-27 MED ORDER — MICROFIBRILLAR COLL HEMOSTAT EX POWD
CUTANEOUS | Status: AC
  Filled 2021-05-27: qty 5

## 2021-05-27 MED ORDER — METHYLPREDNISOLONE ACETATE 80 MG/ML IJ SUSP
INTRAMUSCULAR | Status: DC | PRN
  Administered 2021-05-27: 80 mg

## 2021-05-27 MED ORDER — CEFAZOLIN SODIUM-DEXTROSE 2-4 GM/100ML-% IV SOLN
2.0000 g | INTRAVENOUS | Status: AC
  Administered 2021-05-27: 2 g via INTRAVENOUS
  Filled 2021-05-27: qty 100

## 2021-05-27 MED ORDER — LIDOCAINE-EPINEPHRINE 1 %-1:100000 IJ SOLN
INTRAMUSCULAR | Status: DC | PRN
  Administered 2021-05-27: 2.5 mL

## 2021-05-27 MED ORDER — UNIFINE PENTIPS 31G X 6 MM MISC
5 refills | Status: AC
  Filled 2021-05-27: qty 100, 20d supply, fill #0

## 2021-05-27 SURGICAL SUPPLY — 59 items
ADH SKN CLS APL DERMABOND .7 (GAUZE/BANDAGES/DRESSINGS) ×1
BAG COUNTER SPONGE SURGICOUNT (BAG) ×2 IMPLANT
BAG SPNG CNTER NS LX DISP (BAG) ×1
BAND INSRT 18 STRL LF DISP RB (MISCELLANEOUS) ×2
BAND RUBBER #18 3X1/16 STRL (MISCELLANEOUS) ×4 IMPLANT
BUR CARBIDE MATCH 3.0 (BURR) ×2 IMPLANT
CARTRIDGE OIL MAESTRO DRILL (MISCELLANEOUS) ×1 IMPLANT
CNTNR URN SCR LID CUP LEK RST (MISCELLANEOUS) ×1 IMPLANT
CONT SPEC 4OZ STRL OR WHT (MISCELLANEOUS) ×2
COVER MAYO STAND STRL (DRAPES) ×2 IMPLANT
DECANTER SPIKE VIAL GLASS SM (MISCELLANEOUS) ×2 IMPLANT
DERMABOND ADVANCED (GAUZE/BANDAGES/DRESSINGS) ×1
DERMABOND ADVANCED .7 DNX12 (GAUZE/BANDAGES/DRESSINGS) IMPLANT
DIFFUSER DRILL AIR PNEUMATIC (MISCELLANEOUS) ×1 IMPLANT
DRAIN JACKSON RD 7FR 3/32 (WOUND CARE) IMPLANT
DRAPE C-ARM 42X72 X-RAY (DRAPES) ×2 IMPLANT
DRAPE LAPAROTOMY 100X72X124 (DRAPES) ×2 IMPLANT
DRAPE MICROSCOPE LEICA (MISCELLANEOUS) ×2 IMPLANT
DRAPE SURG 17X23 STRL (DRAPES) ×2 IMPLANT
DRSG OPSITE POSTOP 3X4 (GAUZE/BANDAGES/DRESSINGS) ×1 IMPLANT
DURAPREP 26ML APPLICATOR (WOUND CARE) ×2 IMPLANT
ELECT BLADE INSULATED 4IN (ELECTROSURGICAL) ×2
ELECT REM PT RETURN 9FT ADLT (ELECTROSURGICAL) ×2
ELECTRODE BLADE INSULATED 4IN (ELECTROSURGICAL) ×1 IMPLANT
ELECTRODE REM PT RTRN 9FT ADLT (ELECTROSURGICAL) ×1 IMPLANT
GAUZE 4X4 16PLY ~~LOC~~+RFID DBL (SPONGE) ×1 IMPLANT
GAUZE SPONGE 4X4 12PLY STRL (GAUZE/BANDAGES/DRESSINGS) ×1 IMPLANT
GLOVE SRG 8 PF TXTR STRL LF DI (GLOVE) ×1 IMPLANT
GLOVE SURG LTX SZ8 (GLOVE) ×2 IMPLANT
GLOVE SURG UNDER POLY LF SZ7 (GLOVE) ×4 IMPLANT
GLOVE SURG UNDER POLY LF SZ8 (GLOVE) ×2
GOWN STRL REUS W/ TWL LRG LVL3 (GOWN DISPOSABLE) IMPLANT
GOWN STRL REUS W/ TWL XL LVL3 (GOWN DISPOSABLE) ×1 IMPLANT
GOWN STRL REUS W/TWL 2XL LVL3 (GOWN DISPOSABLE) IMPLANT
GOWN STRL REUS W/TWL LRG LVL3 (GOWN DISPOSABLE) ×2
GOWN STRL REUS W/TWL XL LVL3 (GOWN DISPOSABLE) ×6
HEMOSTAT POWDER KIT SURGIFOAM (HEMOSTASIS) ×1 IMPLANT
KIT BASIN OR (CUSTOM PROCEDURE TRAY) ×2 IMPLANT
KIT POSITION SURG JACKSON T1 (MISCELLANEOUS) ×2 IMPLANT
KIT TURNOVER KIT B (KITS) ×2 IMPLANT
MARKER SKIN DUAL TIP RULER LAB (MISCELLANEOUS) ×2 IMPLANT
NDL HYPO 25X1 1.5 SAFETY (NEEDLE) ×1 IMPLANT
NEEDLE HYPO 25X1 1.5 SAFETY (NEEDLE) ×2 IMPLANT
NS IRRIG 1000ML POUR BTL (IV SOLUTION) ×2 IMPLANT
OIL CARTRIDGE MAESTRO DRILL (MISCELLANEOUS)
PACK LAMINECTOMY NEURO (CUSTOM PROCEDURE TRAY) ×2 IMPLANT
PAD ARMBOARD 7.5X6 YLW CONV (MISCELLANEOUS) ×6 IMPLANT
PATTIES SURGICAL .5 X.5 (GAUZE/BANDAGES/DRESSINGS) IMPLANT
PATTIES SURGICAL .5 X1 (DISPOSABLE) IMPLANT
PATTIES SURGICAL 1X1 (DISPOSABLE) IMPLANT
SPONGE SURGIFOAM ABS GEL 12-7 (HEMOSTASIS) ×2 IMPLANT
SPONGE T-LAP 4X18 ~~LOC~~+RFID (SPONGE) ×1 IMPLANT
SUT VIC AB 0 CT1 27 (SUTURE) ×2
SUT VIC AB 0 CT1 27XBRD ANBCTR (SUTURE) ×1 IMPLANT
SUT VIC AB 2-0 CP2 18 (SUTURE) ×2 IMPLANT
SUT VIC AB 3-0 SH 8-18 (SUTURE) ×1 IMPLANT
TOWEL GREEN STERILE (TOWEL DISPOSABLE) ×1 IMPLANT
TOWEL GREEN STERILE FF (TOWEL DISPOSABLE) ×1 IMPLANT
WATER STERILE IRR 1000ML POUR (IV SOLUTION) ×2 IMPLANT

## 2021-05-27 NOTE — Anesthesia Procedure Notes (Signed)
Procedure Name: Intubation Date/Time: 05/27/2021 5:14 PM Performed by: Rande Brunt, CRNA Pre-anesthesia Checklist: Patient identified, Emergency Drugs available, Suction available and Patient being monitored Patient Re-evaluated:Patient Re-evaluated prior to induction Oxygen Delivery Method: Circle System Utilized Preoxygenation: Pre-oxygenation with 100% oxygen Induction Type: IV induction Ventilation: Mask ventilation without difficulty Laryngoscope Size: Mac and 4 Grade View: Grade I Tube type: Oral Tube size: 7.5 mm Number of attempts: 1 Airway Equipment and Method: Stylet Placement Confirmation: ETT inserted through vocal cords under direct vision, positive ETCO2 and breath sounds checked- equal and bilateral Secured at: 23 cm Tube secured with: Tape Dental Injury: Teeth and Oropharynx as per pre-operative assessment

## 2021-05-27 NOTE — Transfer of Care (Signed)
Immediate Anesthesia Transfer of Care Note  Patient: Dale Anderson  Procedure(s) Performed: MICRODISCECTOMY LUMBAR FIVE-SACRAL ONE USING MET-RX (Left: Spine Lumbar)  Patient Location: PACU  Anesthesia Type:General  Level of Consciousness: drowsy, patient cooperative and responds to stimulation  Airway & Oxygen Therapy: Patient Spontanous Breathing  Post-op Assessment: Report given to RN and Post -op Vital signs reviewed and stable  Post vital signs: Reviewed and stable  Last Vitals:  Vitals Value Taken Time  BP 173/77 05/27/21 1956  Temp    Pulse 69 05/27/21 1956  Resp 13 05/27/21 1956  SpO2 100 % 05/27/21 1956  Vitals shown include unvalidated device data.  Last Pain:  Vitals:   05/27/21 1445  TempSrc: Oral  PainSc: 0-No pain         Complications: No notable events documented.

## 2021-05-27 NOTE — H&P (Signed)
Providing Compassionate, Quality Care - Together  NEUROSURGERY HISTORY & PHYSICAL   Dale Anderson is an 87 y.o. male.   Chief Complaint: Left foot drop HPI: This is an 87 year old male with a history of A. fib on Eliquis and complains of left lower extremity weakness for approximately 3 weeks.  He has had difficulty with his walking and a few falls due to his foot weakness.  MRI revealed a left L4-5 disc herniation with severe stenosis along the left L5 nerve root in the axilla of the nerve root with inferior migration.  Given his significant weakness, I recommended surgical intervention in the form of a left L5-S1 microdiscectomy.  We did discuss conservative measures and bracing however he did not want to proceed with conservative measures given his difficulty with walking.  Past Medical History:  Diagnosis Date   Adhesive capsulitis of right shoulder    Anxiety    Diabetes mellitus (HCC)    DJD (degenerative joint disease)    Dysrhythmia    Atrial fib   GERD (gastroesophageal reflux disease)    Hemorrhoid    History of shingles    Hyperlipidemia    Hypertension    Nodular prostate without urinary obstruction    Recurrent bronchiectasis (HCC)    Tear of right rotator cuff     Past Surgical History:  Procedure Laterality Date   COLONOSCOPY  1998   HAND RECONSTRUCTION     right   INGUINAL HERNIA REPAIR  2006   Right.  Dr. Bubba Camp   ROTATOR CUFF REPAIR  1995   Left.  Dr. Wonda Olds   SHOULDER ARTHROSCOPY WITH ROTATOR CUFF REPAIR AND SUBACROMIAL DECOMPRESSION  05/25/2012   Procedure: SHOULDER ARTHROSCOPY WITH ROTATOR CUFF REPAIR AND SUBACROMIAL DECOMPRESSION;  Surgeon: Lorn Junes, MD;  Location: [facility];  Service: Orthopedics;  Laterality: Right;  Right Shoulder Arthroscopy shoulder decompression subacromial partial acromioplasty with coracoaromial release, arthroscopy shoulder distal claviculectomy, arthroscopy shoulder with rotator cuff repai     Family History  Problem Relation Age of Onset   Heart disease Father        MI at age 63   Emphysema Other        cousin   Colon cancer Neg Hx    Social History:  reports that he has never smoked. He has never used smokeless tobacco. He reports that he does not drink alcohol and does not use drugs.  Allergies:  Allergies  Allergen Reactions   Lisinopril Cough    Medications Prior to Admission  Medication Sig Dispense Refill   ALPRAZolam (XANAX) 0.5 MG tablet TAKE 1 TABLET BY MOUTH DAILY AS NEEDED FOR ANXIETY (Patient taking differently: Take 0.5 mg by mouth daily as needed for anxiety.) 30 tablet 3   apixaban (ELIQUIS) 2.5 MG TABS tablet Take 1 tablet (2.5 mg total) by mouth 2 (two) times daily. 180 tablet 1   Cholecalciferol (VITAMIN D-3) 25 MCG (1000 UT) CAPS Take 1,000 Units by mouth daily.     insulin glargine-yfgn (SEMGLEE, YFGN,) 100 UNIT/ML Pen Inject 10 units into the skin daily. 9 mL 3   meclizine (ANTIVERT) 12.5 MG tablet Take 1 tablet (12.5 mg total) by mouth 3 (three) times daily as needed for dizziness. 30 tablet 0   pantoprazole (PROTONIX) 40 MG tablet Take 1 tablet (40 mg total) by mouth daily. 90 tablet 1   Semaglutide,0.25 or 0.5MG/DOS, (OZEMPIC, 0.25 OR 0.5 MG/DOSE,) 2 MG/1.5ML SOPN Inject 0.5 mg into the skin once a  week on Sundays (Patient taking differently: Inject 0.5 mg into the skin every Sunday.) 1.5 mL 2   tamsulosin (FLOMAX) 0.4 MG CAPS capsule TAKE 1 CAPSULE BY MOUTH DAILY. (Patient taking differently: Take 0.4 mg by mouth at bedtime.) 90 capsule 1   atorvastatin (LIPITOR) 20 MG tablet Take 1 tablet (20 mg total) by mouth daily. 90 tablet 1   blood glucose meter kit and supplies KIT Use to test blood sugar once daily. DX: E11.9 1 each 0   Blood Glucose Monitoring Suppl (FREESTYLE LITE) DEVI   0   Continuous Blood Gluc Receiver (FREESTYLE LIBRE 2 READER) DEVI USE AS DIRECTED 2 each 5   Continuous Blood Gluc Sensor (FREESTYLE LIBRE 2 SENSOR) MISC USE AS  DIRECTED (Patient taking differently: Inject 1 Device into the skin every 14 (fourteen) days.) 2 each 5   diclofenac Sodium (VOLTAREN) 1 % GEL Apply 4 g topically 4 (four) times daily. 100 g 0   glucose blood (FREESTYLE LITE) test strip USE TO CHECK BLOOD SUGAR ONCE DAILY 100 strip 1   Insulin Pen Needle (UNIFINE PENTIPS) 31G X 6 MM MISC USE TO INJECT INSULIN 5 TIMES DAILY 100 each 5   L-Methylfolate-Algae-B12-B6 3-90.314-2-35 MG CAPS Take 1 capsule by mouth 2 (two) times daily. 60 capsule 5   Lancets MISC Use to test blood sugar once daily. DX E11.09 100 each 3   lidocaine (LIDODERM) 5 % Place 1 patch onto the skin daily. Remove & Discard patch within 12 hours or as directed by MD 30 patch 0   lisinopril (ZESTRIL) 40 MG tablet Take 0.5 tablets by mouth daily. (Patient not taking: Reported on 05/27/2021)     metoprolol succinate (TOPROL-XL) 25 MG 24 hr tablet Take 1 tablet (25 mg total) by mouth daily. Needs office visit. (Patient not taking: Reported on 05/27/2021) 30 tablet 0   Multiple Vitamins-Minerals (CENTRUM SILVER PO) Take 1 tablet by mouth daily with breakfast.      Results for orders placed or performed during the hospital encounter of 05/27/21 (from the past 48 hour(s))  SARS Coronavirus 2 by RT PCR (hospital order, performed in [facility] hospital lab) Nasopharyngeal Nasopharyngeal Swab     Status: None   Collection Time: 05/27/21  2:15 PM   Specimen: Nasopharyngeal Swab  Result Value Ref Range   SARS Coronavirus 2 NEGATIVE NEGATIVE    Comment: (NOTE) SARS-CoV-2 target nucleic acids are NOT DETECTED.  The SARS-CoV-2 RNA is generally detectable in upper and lower respiratory specimens during the acute phase of infection. The lowest concentration of SARS-CoV-2 viral copies this assay can detect is 250 copies / mL. A negative result does not preclude SARS-CoV-2 infection and should not be used as the sole basis for treatment or other patient management decisions.  A negative result  may occur with improper specimen collection / handling, submission of specimen other than nasopharyngeal swab, presence of viral mutation(s) within the areas targeted by this assay, and inadequate number of viral copies (<250 copies / mL). A negative result must be combined with clinical observations, patient history, and epidemiological information.  Fact Sheet for Patients:   StrictlyIdeas.no  Fact Sheet for Healthcare Providers: BankingDealers.co.za  This test is not yet approved or  cleared by the [place] FDA and has been authorized for detection and/or diagnosis of SARS-CoV-2 by FDA under an Emergency Use Authorization (EUA).  This EUA will remain in effect (meaning this test can be used) for the duration of the COVID-19 declaration under Section 564(b)(1)  of the Act, 21 U.S.C. section 360bbb-3(b)(1), unless the authorization is terminated or revoked sooner.  Performed at [facility], [place] [address] [place], [place] [address]   Glucose, capillary     Status: None   Collection Time: 05/27/21  2:28 PM  Result Value Ref Range   Glucose-Capillary 78 70 - 99 mg/dL    Comment: Glucose reference range applies only to samples taken after fasting for at least 8 hours.   No results found.  ROS All positives and negatives listed in HPI above  Blood pressure (!) 181/87, pulse 72, temperature 98 F (36.7 C), temperature source Oral, resp. rate 16, SpO2 100 %. Physical Exam  Awake alert oriented x3 PERRLA EOMI Cranial nerves II through XII intact Bilateral upper extremity 5/5 Right lower extremity 5/5 Left lower extremity 5/5 except for EHL, dorsiflexion 3/5  Assessment/Plan 87 year old male with  Left L4-5 inferior migrated herniated nucleus pulposis with L5 nerve root compression Left foot drop  -OR today for left L5-S1 microdiscectomy (due to inferior migration of disc).  We discussed all risks, benefits and  expected outcomes as well as alternatives.  Answered all of his questions.  He would like to proceed with surgical intervention.  He has been off his Eliquis for 4 days.   Thank you for allowing me to participate in this patient's care.  Please do not hesitate to call with questions or concerns.   Elwin Sleight, [facility] Cell: [phone]

## 2021-05-27 NOTE — Progress Notes (Signed)
   Providing Compassionate, Quality Care - Together  NEUROSURGERY PROGRESS NOTE   S: s/e in pacu  O: EXAM:  BP (!) 145/58 (BP Location: Right Arm)   Pulse 63   Temp 97.6 F (36.4 C)   Resp 15   SpO2 100%   Awake, alert PERRL Speech fluent, appropriate  CNs grossly intact  5/5 BUE 5/5 RLE  5/5 LLE except EHL/DF 3/5  ASSESSMENT:  87 y.o. male with   LLE Radic with foot drop  S/p L L5-S1 LR decomp/hemilami and foraminotomy  PLAN: - dc home, family updated    Thank you for allowing me to participate in this patient's care.  Please do not hesitate to call with questions or concerns.   Elwin Sleight, [facility] Cell: [phone]

## 2021-05-27 NOTE — Anesthesia Preprocedure Evaluation (Signed)
Anesthesia Evaluation  Patient identified by MRN, date of birth, ID band Patient awake    Reviewed: Allergy & Precautions, NPO status , Patient's Chart, lab work & pertinent test results  History of Anesthesia Complications Negative for: history of anesthetic complications  Airway Mallampati: I  TM Distance: >3 FB Neck ROM: Full    Dental  (+) Teeth Intact, Dental Advisory Given,    Pulmonary neg pulmonary ROS, neg shortness of breath, neg sleep apnea, neg COPD, neg recent URI,    breath sounds clear to auscultation       Cardiovascular hypertension, + dysrhythmias Atrial Fibrillation  Rhythm:Regular  1. Left ventricular ejection fraction, by estimation, is 60 to 65%. The  left ventricle has normal function. The left ventricle has no regional  wall motion abnormalities. There is moderate left ventricular hypertrophy.  Left ventricular diastolic  parameters are consistent with Grade I diastolic dysfunction (impaired  relaxation). GLS -23.2%.  2. Right ventricular systolic function is normal. The right ventricular  size is normal. Tricuspid regurgitation signal is inadequate for assessing  PA pressure.  3. The mitral valve is normal in structure. Trivial mitral valve  regurgitation. No evidence of mitral stenosis.  4. The aortic valve is tricuspid. Aortic valve regurgitation is not  visualized. Mild aortic valve sclerosis is present, with no evidence of  aortic valve stenosis.  5. The inferior vena cava is normal in size with greater than 50%  respiratory variability, suggesting right atrial pressure of 3 mmHg.    Neuro/Psych PSYCHIATRIC DISORDERS Anxiety  Neuromuscular disease    GI/Hepatic Neg liver ROS, GERD  Medicated,  Endo/Other  diabetes  Renal/GU CRFRenal diseaseLab Results      Component                Value               Date                      CREATININE               1.78 (H)            05/14/2021                 Musculoskeletal  (+) Arthritis ,   Abdominal   Peds  Hematology negative hematology ROS (+) Lab Results      Component                Value               Date                      WBC                      6.3                 05/14/2021                HGB                      12.3 (L)            05/14/2021                HCT                      37.2 (L)  05/14/2021                MCV                      96.4                05/14/2021                PLT                      206                 05/14/2021              Anesthesia Other Findings   Reproductive/Obstetrics                             Anesthesia Physical Anesthesia Plan  ASA: 3  Anesthesia Plan: General   Post-op Pain Management:    Induction:   PONV Risk Score and Plan: 2 and Ondansetron and Dexamethasone  Airway Management Planned: Oral ETT  Additional Equipment: None  Intra-op Plan:   Post-operative Plan: Extubation in OR  Informed Consent: I have reviewed the patients History and Physical, chart, labs and discussed the procedure including the risks, benefits and alternatives for the proposed anesthesia with the patient or authorized representative who has indicated his/her understanding and acceptance.     Dental advisory given  Plan Discussed with: CRNA and Anesthesiologist  Anesthesia Plan Comments:         Anesthesia Quick Evaluation

## 2021-05-27 NOTE — Op Note (Signed)
Providing Compassionate, Quality Care - Together  Date of service: 05/27/2021  PREOP DIAGNOSIS:  Left L5 radiculopathy with foot drop Severe left L5-S1 foraminal stenosis, moderate L4-5 lateral recess stenosis, left  POSTOP DIAGNOSIS: Same  PROCEDURE: Left minimally invasive L5-S1 hemilaminotomy, lateral recess decompression and foraminotomy Intraoperative use of microscope for microdissection Intraoperative use of fluoroscopy  SURGEON: Dr. Pieter Partridge C. Kyng Matlock, DO  ASSISTANT: Dr. Ashok Pall, MD; Weston Brass, NP  ANESTHESIA: General Endotracheal  EBL: 20 cc  SPECIMENS: None  DRAINS: None  COMPLICATIONS: None  CONDITION: Hemodynamically stable  HISTORY: Dale Anderson is a 87 y.o. male that presented with left lower extremity weakness for 2-3 weeks with radicular pain radiating into his calf and foot.  MRI revealed severe foraminal stenosis on the left at L5-S1 with what appeared to be a inferiorly migrated L4-5 herniated nucleus pulposus causing severe L5 nerve root compression on the left.  Given his weakness, I offered surgical decompression in the form of a left L5-S1 microdiscectomy and lateral recess decompression and foraminotomy versus AFO bracing and pain control.  He decided to undergo surgical intervention in the form of listed above.  We discussed all risks, benefits and expected outcomes and informed consent was obtained.  I answered all of his questions.  PROCEDURE IN DETAIL: The patient was brought to the operating room. After induction of general anesthesia, the patient was positioned on the operative table in the prone position. All pressure points were meticulously padded. Skin incision was then marked out and prepped and draped in the usual sterile fashion.  Physician driven timeout was performed  Using 10 blade, a midline incision was performed sharply over the L5-S1 disc space.  Using a series of Metrix dilators, the left L5 lamina was docked with a 4 x  20 mm Metrix tube.  This was confirmed in the appropriate position with lateral fluoroscopy.  Microscope was sterilely draped and brought into the field.  Using Bovie electrocautery, the L5 lamina on the left and medial facet at L5-S1 was cleared of soft tissue.  Using high-speed drill a left laminotomy and medial facetectomy was performed down to the ligamentum flavum.  The ligamentum flavum was gently dissected from the epidural space and resected with Kerrison rongeur identifying the thecal sac and traversing S1 nerve root.  This was gently retracted medially there is a significant amount of epidural fat and compression along the foramen due to foraminal stenosis due to ligamentum flavum.  This was resected with Kerrison rongeurs.  The thecal sac was tracked superiorly until the L5 nerve root was identified.  This was explored and there was no focal disc herniation noted.  This was explored into the axilla of the L5 nerve root as well as laterally into the foramen and appeared adequately decompressed.  The L5 pedicle was palpated superiorly and inferiorly.  Lateral fluoroscopy confirmed appropriate level at the level of the L5 pedicle and L5-S1 disc space.  Epidural hemostasis was achieved with Surgifoam.  Again using a ball-tipped probe, the S1 nerve root and L5 nerve roots were tracked along the entire course and there was no focal disc herniation noted.  They appeared decompressed and pulsatile.  A mixture of Depo-Medrol and 50 mcg of fentanyl was placed on a small piece of Gelfoam this was placed over the thecal sac.  The Metrix dilator tube was removed and soft tissue hemostasis was achieved with bipolar cautery.  The wound was closed in layers, 2-0 Vicryl sutures for dermis and fascia.  Dermabond was used for skin closure.  Sterile dressing was applied.  Local anesthetic was placed along the incision and intramuscular.  At the end of the case all sponge, needle, and instrument counts were correct. The  patient was then transferred to the stretcher, extubated, and taken to the post-anesthesia care unit in stable hemodynamic condition.

## 2021-05-28 ENCOUNTER — Encounter (HOSPITAL_COMMUNITY): Payer: Self-pay | Admitting: Neurological Surgery

## 2021-05-28 ENCOUNTER — Other Ambulatory Visit (HOSPITAL_COMMUNITY): Payer: Self-pay

## 2021-05-28 NOTE — Anesthesia Postprocedure Evaluation (Signed)
Anesthesia Post Note  Patient: Dale Anderson  Procedure(s) Performed: MICRODISCECTOMY LUMBAR FIVE-SACRAL ONE USING MET-RX (Left: Spine Lumbar)     Patient location during evaluation: PACU Anesthesia Type: General Level of consciousness: awake and alert Pain management: pain level controlled Vital Signs Assessment: post-procedure vital signs reviewed and stable Respiratory status: spontaneous breathing, nonlabored ventilation and respiratory function stable Cardiovascular status: blood pressure returned to baseline and stable Postop Assessment: no apparent nausea or vomiting Anesthetic complications: no   No notable events documented.  Last Vitals:  Vitals:   05/27/21 2030 05/27/21 2045  BP: (!) 145/58 (!) 156/104  Pulse: 63 62  Resp: 15 13  Temp:  36.4 C  SpO2: 100% 97%    Last Pain:  Vitals:   05/27/21 2045  TempSrc:   PainSc: 0-No pain                 Zahlia Deshazer,W. EDMOND

## 2021-05-29 ENCOUNTER — Other Ambulatory Visit: Payer: Self-pay | Admitting: Neurological Surgery

## 2021-06-03 ENCOUNTER — Telehealth: Payer: 59

## 2021-06-05 ENCOUNTER — Other Ambulatory Visit (HOSPITAL_COMMUNITY): Payer: Self-pay

## 2021-06-10 ENCOUNTER — Ambulatory Visit (INDEPENDENT_AMBULATORY_CARE_PROVIDER_SITE_OTHER): Payer: 59 | Admitting: Family Medicine

## 2021-06-10 VITALS — BP 143/73 | Ht 71.0 in | Wt 161.0 lb

## 2021-06-10 DIAGNOSIS — M21372 Foot drop, left foot: Secondary | ICD-10-CM | POA: Diagnosis not present

## 2021-06-10 NOTE — Patient Instructions (Signed)
You have foot drop of your left foot. Wear the AFO every time you're up and walking around to prevent you from falling. Talk to the neurosurgeon on the 22nd about this - this is very important to address the cause of your foot drop.

## 2021-06-11 ENCOUNTER — Encounter: Payer: Self-pay | Admitting: Family Medicine

## 2021-06-11 NOTE — Progress Notes (Signed)
PCP: [name], MD  Subjective:   HPI: Patient is a 87 y.o. male here for left leg weakness.  Patient had lumbar discectomy 2 weeks ago. He states primary issue he had leading to that surgery was severe pain in right side of low back going into right leg. That pain is much better. He has history of neuropathy in both lower extremities. Current complaint is left foot/lower leg feel weak. He is having to slap his foot on the left side when he walks. Almost fell down the other day due to scuffing left toe on the ground. No pain lateral knee. Has not discussed this with his neurosurgeon.  Past Medical History:  Diagnosis Date   Adhesive capsulitis of right shoulder    Anxiety    Diabetes mellitus (HCC)    DJD (degenerative joint disease)    Dysrhythmia    Atrial fib   GERD (gastroesophageal reflux disease)    Hemorrhoid    History of shingles    Hyperlipidemia    Hypertension    Nodular prostate without urinary obstruction    Recurrent bronchiectasis (HCC)    Tear of right rotator cuff     Current Outpatient Medications on File Prior to Visit  Medication Sig Dispense Refill   ALPRAZolam (XANAX) 0.5 MG tablet TAKE 1 TABLET BY MOUTH DAILY AS NEEDED FOR ANXIETY (Patient taking differently: Take 0.5 mg by mouth daily as needed for anxiety.) 30 tablet 3   apixaban (ELIQUIS) 2.5 MG TABS tablet Take 1 tablet (2.5 mg total) by mouth 2 (two) times daily. 180 tablet 1   atorvastatin (LIPITOR) 20 MG tablet Take 1 tablet (20 mg total) by mouth daily. 90 tablet 1   blood glucose meter kit and supplies KIT Use to test blood sugar once daily. DX: E11.9 1 each 0   Blood Glucose Monitoring Suppl (FREESTYLE LITE) DEVI   0   Cholecalciferol (VITAMIN D-3) 25 MCG (1000 UT) CAPS Take 1,000 Units by mouth daily.     Continuous Blood Gluc Receiver (FREESTYLE LIBRE 2 READER) DEVI USE AS DIRECTED 2 each 5   Continuous Blood Gluc Sensor (FREESTYLE LIBRE 2 SENSOR) MISC USE AS DIRECTED (Patient  taking differently: Inject 1 Device into the skin every 14 (fourteen) days.) 2 each 5   diclofenac Sodium (VOLTAREN) 1 % GEL Apply 4 g topically 4 (four) times daily. 100 g 0   glucose blood (FREESTYLE LITE) test strip USE TO CHECK BLOOD SUGAR ONCE DAILY 100 strip 1   HYDROcodone-acetaminophen (NORCO/VICODIN) 5-325 MG tablet Take 1 tablet by mouth every 6 (six) hours as needed for moderate pain. 30 tablet 0   insulin glargine-yfgn (SEMGLEE, YFGN,) 100 UNIT/ML Pen Inject 10 units into the skin daily. 9 mL 3   Insulin Pen Needle (UNIFINE PENTIPS) 31G X 6 MM MISC USE TO INJECT INSULIN 5 TIMES DAILY 100 each 5   L-Methylfolate-Algae-B12-B6 3-90.314-2-35 MG CAPS Take 1 capsule by mouth 2 (two) times daily. 60 capsule 5   Lancets MISC Use to test blood sugar once daily. DX E11.09 100 each 3   lidocaine (LIDODERM) 5 % Place 1 patch onto the skin daily. Remove & Discard patch within 12 hours or as directed by MD 30 patch 0   lisinopril (ZESTRIL) 40 MG tablet Take 0.5 tablets by mouth daily. (Patient not taking: Reported on 05/27/2021)     meclizine (ANTIVERT) 12.5 MG tablet Take 1 tablet (12.5 mg total) by mouth 3 (three) times daily as needed for dizziness. 30 tablet  0   methocarbamol (ROBAXIN) 500 MG tablet Take 1 tablet (500 mg total) by mouth every 8 (eight) hours as needed for muscle spasms. 90 tablet 0   metoprolol succinate (TOPROL-XL) 25 MG 24 hr tablet Take 1 tablet (25 mg total) by mouth daily. Needs office visit. (Patient not taking: Reported on 05/27/2021) 30 tablet 0   Multiple Vitamins-Minerals (CENTRUM SILVER PO) Take 1 tablet by mouth daily with breakfast.     pantoprazole (PROTONIX) 40 MG tablet Take 1 tablet (40 mg total) by mouth daily. 90 tablet 1   Semaglutide,0.25 or 0.5MG/DOS, (OZEMPIC, 0.25 OR 0.5 MG/DOSE,) 2 MG/1.5ML SOPN Inject 0.5 mg into the skin once a week on Sundays (Patient taking differently: Inject 0.5 mg into the skin every Sunday.) 1.5 mL 2   tamsulosin (FLOMAX) 0.4 MG CAPS  capsule TAKE 1 CAPSULE BY MOUTH DAILY. (Patient taking differently: Take 0.4 mg by mouth at bedtime.) 90 capsule 1   [DISCONTINUED] telmisartan (MICARDIS) 40 MG tablet TAKE 1 TABLET BY MOUTH DAILY. 90 tablet 1   No current facility-administered medications on file prior to visit.    Past Surgical History:  Procedure Laterality Date   COLONOSCOPY  1998   HAND RECONSTRUCTION     right   INGUINAL HERNIA REPAIR  2006   Right.  Dr. Bubba Camp   LUMBAR LAMINECTOMY/DECOMPRESSION MICRODISCECTOMY Left 05/27/2021   Procedure: MICRODISCECTOMY LUMBAR FIVE-SACRAL ONE USING MET-RX;  Surgeon: Dawley, Theodoro Doing, DO;  Location: [facility];  Service: Neurosurgery;  Laterality: Left;   ROTATOR CUFF REPAIR  1995   Left.  Dr. Wonda Olds   SHOULDER ARTHROSCOPY WITH ROTATOR CUFF REPAIR AND SUBACROMIAL DECOMPRESSION  05/25/2012   Procedure: SHOULDER ARTHROSCOPY WITH ROTATOR CUFF REPAIR AND SUBACROMIAL DECOMPRESSION;  Surgeon: Lorn Junes, MD;  Location: [facility];  Service: Orthopedics;  Laterality: Right;  Right Shoulder Arthroscopy shoulder decompression subacromial partial acromioplasty with coracoaromial release, arthroscopy shoulder distal claviculectomy, arthroscopy shoulder with rotator cuff repai    Allergies  Allergen Reactions   Lisinopril Cough    BP (!) 143/73    Ht '5\' 11"'  (1.803 m)    Wt 161 lb (73 kg)    BMI 22.45 kg/m   [facility] 06/10/2021  Frequency of aerobic exercise (# of days/week) 4  Average time in minutes 5    No flowsheet data found.      Objective:  Physical Exam:  Gen: NAD, comfortable in exam room  Left leg: No deformity, swelling, bruising. No tenderness including at fibular head. FROM hip, knee, ankle.  Strength 3/5 with ankle dorsiflexion, 5/5 other muscle groups. Negative tinels at fibular head.  Gait: scuffing left foot - mild slap foot gait with excessive hip flexion compared to right side.   Assessment & Plan:  1. Left foot  drop - concerned due to compression at lumbar spine.  He reports being told he has some issues to the left side but pain, worse issues were on right so he had microdiscectomy here.  He does have appointment with Dr. Reatha Armour at end of week - advised him to discuss in depth with him and review next steps.  He needs an AFO so we wrote a script for this today.

## 2021-06-21 ENCOUNTER — Ambulatory Visit (HOSPITAL_COMMUNITY)
Admission: EM | Admit: 2021-06-21 | Discharge: 2021-06-21 | Disposition: A | Discharge status: Home / Self Care | Payer: PPO | Attending: Physician Assistant | Admitting: Physician Assistant

## 2021-06-21 ENCOUNTER — Ambulatory Visit (INDEPENDENT_AMBULATORY_CARE_PROVIDER_SITE_OTHER): Payer: 59

## 2021-06-21 ENCOUNTER — Telehealth: Payer: Self-pay | Admitting: Internal Medicine

## 2021-06-21 ENCOUNTER — Other Ambulatory Visit: Payer: Self-pay

## 2021-06-21 ENCOUNTER — Other Ambulatory Visit (HOSPITAL_COMMUNITY): Payer: Self-pay

## 2021-06-21 ENCOUNTER — Encounter (HOSPITAL_COMMUNITY): Payer: Self-pay | Admitting: *Deleted

## 2021-06-21 DIAGNOSIS — U071 COVID-19: Secondary | ICD-10-CM | POA: Diagnosis not present

## 2021-06-21 MED ORDER — MOLNUPIRAVIR EUA 200MG CAPSULE
4.0000 | ORAL_CAPSULE | Freq: Two times a day (BID) | ORAL | 0 refills | Status: AC
  Filled 2021-06-21: qty 40, 5d supply, fill #0

## 2021-06-21 NOTE — Progress Notes (Signed)
Chronic Care Management Pharmacy Note  06/21/2021 Name:  Dale Anderson MRN:  [id number] DOB:  06/02/1930  Summary: -Patient's wife recently tested positive for [place] 06/19/2021 - Patient has not tested since wife was positive, noted to have fatigue, cough, and decreased appetite - denies SOB / loss of taste or smell, unsure if he is running a fever or not  -Patient reports that he has been checking blood sugars regularly - averaging 80-120 - denies any issues with hypoglycemia  -Checking blood pressure on occasion, reports last he could recall was 160/90 -Scheduled to return to work 06/29/2020 (recent back surgery)  Recommendations/Changes made from today's visit: -Recommending for patient to test for COVID, tried to schedule for televideo visit, but no appointments were available until 06/26/2020 - advised to go to urgent care for testing / prescription of antiviral if positive - DIL coming to take him in the afternoon - would recommend use of molnupiravir as he is taking eliquis  -Patient to start checking blood pressure at least every other day - to reach out should BP average be >140/90 -continue to monitor blood sugars, reach out with any issues of hypoglycemia   Subjective: Dale Anderson is an 87 y.o. year old male who is a primary patient of [name], MD.  The CCM team was consulted for assistance with disease management and care coordination needs.    Engaged with patient by telephone for follow up visit in response to provider referral for pharmacy case management and/or care coordination services.   Consent to Services:  The patient was given information about Chronic Care Management services, agreed to services, and gave verbal consent prior to initiation of services.  Please see initial visit note for detailed documentation.   Patient Care Team: [name], MD as PCP - General (Internal Medicine) Belva Crome, MD as PCP - Cardiology (Cardiology) Alda Berthold, DO as Consulting Physician (Neurology) Charlton Haws, [facility] as Pharmacist (Pharmacist) Knox Royalty, RN as [place] Management  Recent office visits: 03/13/2021 - Dr. Ronnald Ramp - no changes to medications - f/u in 6 months   Recent consult visits: 06/10/2021 - Dr. Ned Clines - sports medicine - left foot drop- to discuss with neurosurgeon with appointment 12/22 05/22/2021  - [facility] - notes not available  05/21/2021 - Dr. Tamala Julian - Cardiology - EKG - SR no arrhythmias noted - no changes to medications   Hospital visits: 05/27/2021 - Procedure - lumbar discectomy  05/14/2021 - ED visit - vertigo -  prescribed meclizine and ondansetron   Objective:  Lab Results  Component Value Date   CREATININE 1.78 (H) 05/14/2021   BUN 21 05/14/2021   GFR 34.97 (L) 03/18/2021   GFRNONAA 36 (L) 05/14/2021   GFRAA 65 (L) 05/19/2012   NA 137 05/14/2021   K 5.0 05/14/2021   CALCIUM 9.0 05/14/2021   CO2 25 05/14/2021    Lab Results  Component Value Date/Time   HGBA1C 7.1 (H) 03/18/2021 04:11 PM   HGBA1C 7.9 (H) 10/17/2020 04:00 PM   HGBA1C 7.1 01/25/2016 12:00 AM   GFR 34.97 (L) 03/18/2021 04:11 PM   GFR 39.08 (L) 12/28/2020 12:08 PM   MICROALBUR 10.0 (H) 10/17/2020 04:00 PM   MICROALBUR 12.5 (H) 09/22/2019 10:34 AM    Last diabetic Eye exam:  Lab Results  Component Value Date/Time   HMDIABEYEEXA No Retinopathy 12/10/2018 12:00 AM    Last diabetic Foot exam:  Lab Results  Component Value Date/Time   HMDIABFOOTEX done 08/29/2013 12:00 AM     Lab Results  Component Value Date   CHOL 127 03/18/2021   HDL 45.10 03/18/2021   LDLCALC 45 03/18/2021   TRIG 188.0 (H) 03/18/2021   CHOLHDL 3 03/18/2021    Hepatic Function Latest Ref Rng & Units 05/14/2021 12/28/2020 07/17/2020  Total Protein 6.5 - 8.1 g/dL 6.9 7.5 7.8  Albumin 3.5 - 5.0 g/dL 3.8 4.1 4.4  AST 15 - 41 U/L '25 19 18  ' ALT 0 - 44 U/L '25 16 19  ' Alk Phosphatase 38 -  126 U/L 77 63 71  Total Bilirubin 0.3 - 1.2 mg/dL 0.7 1.2 1.1  Bilirubin, Direct 0.0 - 0.3 mg/dL - 0.2 0.2    Lab Results  Component Value Date/Time   TSH 2.846 05/14/2021 06:22 PM   TSH 2.32 07/17/2020 02:23 PM   TSH 4.28 01/04/2020 09:33 AM    CBC Latest Ref Rng & Units 05/14/2021 03/18/2021 12/28/2020  WBC 4.0 - 10.5 K/uL 6.3 7.4 6.7  Hemoglobin 13.0 - 17.0 g/dL 12.3(L) 12.1(L) 12.4(L)  Hematocrit 39.0 - 52.0 % 37.2(L) 35.3(L) 36.5(L)  Platelets 150 - 400 K/uL 206 208.0 195.0    No results found for: VD25OH  Clinical ASCVD: No  The ASCVD Risk score (Arnett DK, et al., 2019) failed to calculate for the following reasons:   The 2019 ASCVD risk score is only valid for ages 86 to 19    Depression screen PHQ 2/9 02/07/2021 10/17/2020 08/13/2020  Decreased Interest 0 0 0  Down, Depressed, Hopeless 0 0 0  PHQ - 2 Score 0 0 0  Some recent data might be hidden    GAD 7 : Generalized Anxiety Score 05/25/2018 11/24/2016  Nervous, Anxious, on Edge 2 0  Control/stop worrying 0 0  Worry too much - different things 1 0  Trouble relaxing 1 0  Restless 1 0  Easily annoyed or irritable 0 0  Afraid - awful might happen 0 0  Total GAD 7 Score 5 0   CHA2DS2-VASc Score = 4  The patient's score is based upon: CHF History: 0 HTN History: 1 Diabetes History: 1 Stroke History: 0 Vascular Disease History: 0 Age Score: 2 Gender Score: 0      Social History   Tobacco Use  Smoking Status Never  Smokeless Tobacco Never   BP Readings from Last 3 Encounters:  06/10/21 (!) 143/73  05/27/21 (!) 156/104  05/23/21 120/64   Pulse Readings from Last 3 Encounters:  05/27/21 62  05/23/21 70  05/21/21 66   Wt Readings from Last 3 Encounters:  06/10/21 161 lb (73 kg)  05/23/21 160 lb 12.8 oz (72.9 kg)  05/21/21 161 lb (73 kg)   BMI Readings from Last 3 Encounters:  06/10/21 22.45 kg/m  05/23/21 22.43 kg/m  05/21/21 22.45 kg/m     Assessment/Interventions: Review of patient past  medical history, allergies, medications, health status, including review of consultants reports, laboratory and other test data, was performed as part of comprehensive evaluation and provision of chronic care management services.   SDOH:  (Social Determinants of Health) assessments and interventions performed: Yes   CCM Care Plan  Allergies  Allergen Reactions   Lisinopril Cough    Medications Reviewed Today     Reviewed by Tomasa Blase, [facility] (Pharmacist) on 06/21/21 at [place] List Status: <None>   Medication Order Taking? Sig Documenting Provider Last Dose Status Informant  ALPRAZolam (XANAX) 0.5 MG tablet [id number]  Yes TAKE 1 TABLET BY MOUTH DAILY AS NEEDED FOR ANXIETY  Patient taking differently: Take 0.5 mg by mouth daily as needed for anxiety.   [name], MD Taking Active Self  apixaban Arne [place]) 2.5 MG TABS tablet [id number] Yes Take 1 tablet (2.5 mg total) by mouth 2 (two) times daily. Dawley, Theodoro Doing, DO Taking Active   atorvastatin (LIPITOR) 20 MG tablet [id number] Yes Take 1 tablet (20 mg total) by mouth daily. [name], MD Taking Active Self  blood glucose meter kit and supplies KIT [id number]  Use to test blood sugar once daily. DX: E11.9 [name], MD  Active Self  Blood Glucose Monitoring Suppl (FREESTYLE LITE) DEVI [id number]   [provider]  Active Self  Cholecalciferol (VITAMIN D-3) 25 MCG (1000 UT) CAPS [id number]  Take 1,000 Units by mouth daily. [provider]  Active Self  Continuous Blood Gluc Receiver (FREESTYLE LIBRE 2 READER) DEVI [id number]  USE AS DIRECTED [name], MD  Active Self  Continuous Blood Gluc Sensor (FREESTYLE LIBRE 2 SENSOR) [place] [id number] Yes USE AS DIRECTED  Patient taking differently: Inject 1 Device into the skin every 14 (fourteen) days.   [name], MD Taking Active Self           Med Note Duffy Bruce, Lorra Hals May 14, 2021  5:14 PM) The patient IS wearing a sensor at this time (left arm)   glucose blood (FREESTYLE LITE) test strip [id number]  USE TO CHECK BLOOD SUGAR ONCE DAILY [name], MD  Active Self  insulin glargine-yfgn (SEMGLEE, YFGN,) 100 UNIT/ML Pen [id number] Yes Inject 10 units into the skin daily. [name], MD Taking Active Self           Med Note Milagros Reap May 27, 2021  2:30 PM) Patient states he took 10 units   Insulin Pen Needle (UNIFINE PENTIPS) 31G X 6 MM MISC [id number]  USE TO INJECT INSULIN 5 TIMES DAILY [name], MD  Active   L-Methylfolate-Algae-B12-B6 3-90.314-2-35 MG CAPS [id number] Yes Take 1 capsule by mouth 2 (two) times daily. [name], MD Taking Active Self           Med Note Duffy Bruce, Lorra Hals May 14, 2021  4:47 PM) (Metanx)  Lancets MISC [id number]  Use to test blood sugar once daily. DX E11.09 [name], MD  Active Self  metoprolol succinate (TOPROL-XL) 25 MG 24 hr tablet [id number] Yes Take 1 tablet (25 mg total) by mouth daily. Needs office visit. Belva Crome, MD Taking Active Self  Multiple Vitamins-Minerals (CENTRUM SILVER PO) [id number] Yes Take 1 tablet by mouth daily with breakfast. [provider] Taking Active Self  pantoprazole (PROTONIX) 40 MG tablet [id number] Yes Take 1 tablet (40 mg total) by mouth daily. [name], MD Taking Active Self  Semaglutide,0.25 or 0.5MG/DOS, (OZEMPIC, 0.25 OR 0.5 MG/DOSE,) 2 MG/1.5ML SOPN [id number] Yes Inject 0.5 mg into the skin once a week on Sundays  Patient taking differently: Inject 0.5 mg into the skin every Sunday.   [name], MD Taking Active Self  tamsulosin [facility]) 0.4 MG CAPS capsule [id number] Yes TAKE 1 CAPSULE BY MOUTH DAILY.  Patient taking differently: Take 0.4 mg by mouth at bedtime.   [name], MD Taking Active Self    Discontinued 07/17/20 1749 (Discontinued by provider)  Patient Active Problem List   Diagnosis Date Noted   Rotator cuff insufficiency of left shoulder 12/28/2020   Encounter for  general adult medical examination with abnormal findings 07/18/2020   Atrial fibrillation by electrocardiogram ([place]) 01/12/2020   Murmur, cardiac 01/04/2020   Bradycardia 01/04/2020   Internal hemorrhoid 12/08/2019   Tinea cruris 12/05/2019   Seborrheic dermatitis of scalp 05/05/2019   Diabetic polyneuropathy associated with type 2 diabetes mellitus ([place]) 03/29/2019   Seasonal allergic rhinitis due to pollen 05/24/2018   Sciatica 10/21/2017   Chronic renal disease, stage 3, moderately decreased glomerular filtration rate (GFR) between 30-59 mL/min/1.73 square meter ([place]) 07/02/2017   Routine general medical examination at a health care facility 12/26/2013   DDD (degenerative disc disease), cervical 12/26/2013   Hypertension    Hyperlipidemia with target LDL less than 100    Type II diabetes mellitus with manifestations (HCC)    GERD (gastroesophageal reflux disease)    DJD (degenerative joint disease)    Anxiety     Immunization History  Administered Date(s) Administered   Fluad Quad(high Dose 65+) 02/24/2019   Influenza Split 03/05/2011, 03/23/2012   Influenza Whole 03/23/2002, 05/16/2009   Influenza, High Dose Seasonal PF 03/23/2017, 03/27/2018   Influenza-Unspecified 04/23/2006, 03/23/2008, 04/23/2009, 03/19/2014, 03/23/2016, 03/23/2020   PFIZER(Purple Top)SARS-COV-2 Vaccination 06/15/2019, 07/06/2019, 10/12/2020   Pneumococcal Conjugate-13 12/26/2013   Pneumococcal Polysaccharide-23 03/18/2006, 05/24/2018   Pneumococcal-Unspecified 07/20/2005   Tdap 04/08/2012   Zoster, Live 08/08/2004    Conditions to be addressed/monitored:  Hypertension, Hyperlipidemia, Diabetes, Atrial Fibrillation and Chronic Kidney Disease  Care Plan : [place]  Updates made by Tomasa Blase, RPH since 06/21/2021 12:00 AM     Problem: Hypertension, Diabetes, Chronic Kidney Disease   Priority: High     Long-Range Goal: Disease management   Start Date: 08/15/2020  Expected End  Date: 06/21/2022  This Visit's Progress: On track  Recent Progress: On track  Priority: High  Note:   Current Barriers:  Unable to independently monitor therapeutic efficacy Unable to achieve control of diabetes   Pharmacist Clinical Goal(s):  Patient will achieve adherence to monitoring guidelines and medication adherence to achieve therapeutic efficacy adhere to plan to optimize therapeutic regimen for diabetes as evidenced by report of adherence to recommended medication management changes through collaboration with PharmD and provider.   Interventions: 1:1 collaboration with [name], MD regarding development and update of comprehensive plan of care as evidenced by provider attestation and co-signature Inter-disciplinary care team collaboration (see longitudinal plan of care) Comprehensive medication review performed; medication list updated in electronic medical record  Hypertension / Afib CKD Stage 3b (BP goal <140/90) -Controlled - pt reports home BP is normal and he is compliant with medication -Home BP: last he could recall was 160/90 only checking once every few weeks  -Current treatment: Metoprolol succinate 25 mg daily Eliquis 2.29m BID (appropriate dose based on age and renal function) -Medications previously tried: chlorthalidone, lisinopril, telmisartan -Denies hypotensive/hypertensive symptoms -Educated on BP goals and benefits of medications for prevention of heart attack, stroke and kidney damage; Importance of home blood pressure monitoring; -Counseled to continue to monitor BP at home at least every other day -Recommended to continue current medication  Diabetes (A1c goal <7%) -Improved -pt reports BG is mostly in the green on his Freestyle Libre (90-180) and reports fasting BG 80-120; he is compliant with medications as prescribed -Current home glucose readings: fasting glucose: 80-120 Post-prandial glucose: 150 -Current medications: Semglee 10 units  daily (  free with insurance) Ozempic 0.5 mg daily on Sundays Freestyle Libre 2 -Medications previously tried: Barista, metformin  -Counseled on prevention and treatment of hypoglycemia - advised not to skip meals, rule of 15's -Recommend to continue current medication  Hyperlipidemia: (LDL goal < 70) -Controlled Lab Results  Component Value Date   LDLCALC 45 03/18/2021  -Current treatment: Atorvastatin 50m daily  -Medications previously tried: n/a  -Educated on Cholesterol goals;  Benefits of statin for ASCVD risk reduction; Importance of limiting foods high in cholesterol; -Recommended to continue current medication   Patient Goals/Self-Care Activities Patient will:  - take medications as prescribed -focus on medication adherence by routine -[facility], and provide at [facility] appointments -check blood pressure daily, document, and provide at future appointments        Medication Assistance: None required.  Patient affirms current coverage meets needs.  Compliance/Adherence/Medication fill history: Care Gaps: Shingrix Eye exam (due 12/10/19) Foot exam (due 09/21/20)  Star-Rating Drugs: Atorvastatin - LF 10/31/20 x 90 ds   [facility]  [facility]: [phone]: [address]Phone: [phone]: [phone]  Uses pill box? No - prefers bottles Pt endorses 100% compliance  We discussed: Current pharmacy is preferred with insurance plan and patient is satisfied with pharmacy services Patient decided to: Continue current medication management strategy  Care Plan and Follow Up Patient Decision:  Patient agrees to Care Plan and Follow-up.  Plan: Telephone follow up appointment with  care management team member scheduled for:  3 months  DTomasa Blase PharmD Clinical Pharmacist, [place]

## 2021-06-21 NOTE — ED Triage Notes (Signed)
Pt reports a positive COVID test today . Pt has a cough ,runny nose .

## 2021-06-21 NOTE — Telephone Encounter (Signed)
Patient's spouse stated patient has tested positive for covid today 12-30 after phone visit w/ Deeann Dowse,  patient's spouse requesting a rx for antibiotics   Patient was advised by Deeann Dowse on today's phone call visit to go to UC since there are no vv at any [facility] locations for today due to patient not feeling well during phone visit  Informed spouse of Dan's recommendation, spouse stated patient does not want to go to UC, spouse understood recommendation

## 2021-06-21 NOTE — ED Provider Notes (Signed)
[facility]    CSN: [id number] Arrival date & time: 06/21/21  1428      History   Chief Complaint Chief Complaint  Patient presents with   COVID    HPI Dale Anderson is a 87 y.o. male.   Pt reports his wife was diagnosed with covid on Monday.  Pt reports he was sick on Monday and Tuesday.  Pt reports he took a home covid today and is positive.  Pt reports he is feeling better.  No shortness of breath.  No fever or chills   The history is provided by the patient. No language interpreter was used.   Past Medical History:  Diagnosis Date   Adhesive capsulitis of right shoulder    Anxiety    Diabetes mellitus (HCC)    DJD (degenerative joint disease)    Dysrhythmia    Atrial fib   GERD (gastroesophageal reflux disease)    Hemorrhoid    History of shingles    Hyperlipidemia    Hypertension    Nodular prostate without urinary obstruction    Recurrent bronchiectasis (HCC)    Tear of right rotator cuff     Patient Active Problem List   Diagnosis Date Noted   Rotator cuff insufficiency of left shoulder 12/28/2020   Encounter for general adult medical examination with abnormal findings 07/18/2020   Atrial fibrillation by electrocardiogram ([place]) 01/12/2020   Murmur, cardiac 01/04/2020   Bradycardia 01/04/2020   Internal hemorrhoid 12/08/2019   Tinea cruris 12/05/2019   Seborrheic dermatitis of scalp 05/05/2019   Diabetic polyneuropathy associated with type 2 diabetes mellitus ([place]) 03/29/2019   Seasonal allergic rhinitis due to pollen 05/24/2018   Sciatica 10/21/2017   Chronic renal disease, stage 3, moderately decreased glomerular filtration rate (GFR) between 30-59 mL/min/1.73 square meter ([place]) 07/02/2017   Routine general medical examination at a health care facility 12/26/2013   DDD (degenerative disc disease), cervical 12/26/2013   Hypertension    Hyperlipidemia with target LDL less than 100    Type II diabetes mellitus with manifestations (HCC)     GERD (gastroesophageal reflux disease)    DJD (degenerative joint disease)    Anxiety     Past Surgical History:  Procedure Laterality Date   COLONOSCOPY  1998   HAND RECONSTRUCTION     right   INGUINAL HERNIA REPAIR  2006   Right.  Dr. Bubba Camp   LUMBAR LAMINECTOMY/DECOMPRESSION MICRODISCECTOMY Left 05/27/2021   Procedure: MICRODISCECTOMY LUMBAR FIVE-SACRAL ONE USING MET-RX;  Surgeon: Dawley, Theodoro Doing, DO;  Location: [facility];  Service: Neurosurgery;  Laterality: Left;   ROTATOR CUFF REPAIR  1995   Left.  Dr. Wonda Olds   SHOULDER ARTHROSCOPY WITH ROTATOR CUFF REPAIR AND SUBACROMIAL DECOMPRESSION  05/25/2012   Procedure: SHOULDER ARTHROSCOPY WITH ROTATOR CUFF REPAIR AND SUBACROMIAL DECOMPRESSION;  Surgeon: Lorn Junes, MD;  Location: [facility];  Service: Orthopedics;  Laterality: Right;  Right Shoulder Arthroscopy shoulder decompression subacromial partial acromioplasty with coracoaromial release, arthroscopy shoulder distal claviculectomy, arthroscopy shoulder with rotator cuff repai       Home Medications    Prior to Admission medications   Medication Sig Start Date End Date Taking? Authorizing Provider  molnupiravir EUA (LAGEVRIO) 200 mg CAPS capsule Take 4 capsules (800 mg total) by mouth 2 (two) times daily for 5 days. 06/21/21 06/26/21 Yes Caryl Ada K, PA-C  ALPRAZolam (XANAX) 0.5 MG tablet TAKE 1 TABLET BY MOUTH DAILY AS NEEDED FOR ANXIETY Patient taking differently: Take 0.5 mg by  mouth daily as needed for anxiety. 01/23/20   [name], MD  apixaban (ELIQUIS) 2.5 MG TABS tablet Take 1 tablet (2.5 mg total) by mouth 2 (two) times daily. 06/03/21   Dawley, Troy C, DO  atorvastatin (LIPITOR) 20 MG tablet Take 1 tablet (20 mg total) by mouth daily. 02/14/21   [name], MD  blood glucose meter kit and supplies KIT Use to test blood sugar once daily. DX: E11.9 07/24/16   [name], MD  Blood Glucose Monitoring Suppl (FREESTYLE LITE) DEVI  07/24/16    [provider]  Cholecalciferol (VITAMIN D-3) 25 MCG (1000 UT) CAPS Take 1,000 Units by mouth daily.    [provider]  Continuous Blood Gluc Receiver (FREESTYLE LIBRE 2 READER) DEVI USE AS DIRECTED 08/15/20 08/15/21  [name], MD  Continuous Blood Gluc Sensor (FREESTYLE LIBRE 2 SENSOR) MISC USE AS DIRECTED Patient taking differently: Inject 1 Device into the skin every 14 (fourteen) days. 02/20/21 02/20/22  [name], MD  glucose blood (FREESTYLE LITE) test strip USE TO CHECK BLOOD SUGAR ONCE DAILY 02/08/19   [name], MD  insulin glargine-yfgn (SEMGLEE, YFGN,) 100 UNIT/ML Pen Inject 10 units into the skin daily. 01/09/21   [name], MD  Insulin Pen Needle (UNIFINE PENTIPS) 31G X 6 MM MISC USE TO INJECT INSULIN 5 TIMES DAILY 05/27/21 05/27/22  [name], MD  L-Methylfolate-Algae-B12-B6 3-90.314-2-35 MG CAPS Take 1 capsule by mouth 2 (two) times daily. 03/08/21   [name], MD  Lancets MISC Use to test blood sugar once daily. DX E11.09 07/24/16   [name], MD  metoprolol succinate (TOPROL-XL) 25 MG 24 hr tablet Take 1 tablet (25 mg total) by mouth daily. Needs office visit. 04/23/21   Belva Crome, MD  Multiple Vitamins-Minerals (CENTRUM SILVER PO) Take 1 tablet by mouth daily with breakfast.    [provider]  pantoprazole (PROTONIX) 40 MG tablet Take 1 tablet (40 mg total) by mouth daily. 02/14/21   [name], MD  Semaglutide,0.25 or 0.5MG/DOS, (OZEMPIC, 0.25 OR 0.5 MG/DOSE,) 2 MG/1.5ML SOPN Inject 0.5 mg into the skin once a week on Sundays Patient taking differently: Inject 0.5 mg into the skin every Sunday. 05/05/21   [name], MD  tamsulosin (FLOMAX) 0.4 MG CAPS capsule TAKE 1 CAPSULE BY MOUTH DAILY. Patient taking differently: Take 0.4 mg by mouth at bedtime. 07/27/20 07/27/21  [name], MD  telmisartan (MICARDIS) 40 MG tablet TAKE 1 TABLET BY MOUTH DAILY. 04/04/20 07/17/20  [name], MD    Family  History Family History  Problem Relation Age of Onset   Heart disease Father        MI at age 38   Emphysema Other        cousin   Colon cancer Neg Hx     Social History Social History   Tobacco Use   Smoking status: Never   Smokeless tobacco: Never  Vaping Use   Vaping Use: Never used  Substance Use Topics   Alcohol use: No   Drug use: No     Allergies   Lisinopril   Review of Systems Review of Systems  All other systems reviewed and are negative.   Physical Exam Triage Vital Signs ED Triage Vitals  Enc Vitals Group     BP 06/21/21 1637 (!) 143/76     Pulse Rate 06/21/21 1637 95     Resp 06/21/21 1637 18  Temp 06/21/21 1637 97.8 F (36.6 C)     Temp src --      SpO2 06/21/21 1637 100 %     Weight --      Height --      Head Circumference --      Peak Flow --      Pain Score 06/21/21 1634 0     Pain Loc --      Pain Edu? --      Excl. in [place]? --    No data found.  Updated Vital Signs BP (!) 143/76    Pulse 95    Temp 97.8 F (36.6 C)    Resp 18    SpO2 100%   Visual Acuity Right Eye Distance:   Left Eye Distance:   Bilateral Distance:    Right Eye Near:   Left Eye Near:    Bilateral Near:     Physical Exam Vitals and nursing note reviewed.  Constitutional:      Appearance: He is well-developed.  HENT:     Head: Normocephalic.  Cardiovascular:     Rate and Rhythm: Normal rate.  Pulmonary:     Effort: Pulmonary effort is normal.  Abdominal:     General: There is no distension.  Musculoskeletal:        General: Normal range of motion.     Cervical back: Normal range of motion.  Neurological:     General: No focal deficit present.     Mental Status: He is alert and oriented to person, place, and time.  Psychiatric:        Mood and Affect: Mood normal.     UC Treatments / Results  Labs (all labs ordered are listed, but only abnormal results are displayed) Labs Reviewed - No data to display  EKG   Radiology No results  found.  Procedures Procedures (including critical care time)  Medications Ordered in UC Medications - No data to display  Initial Impression / Assessment and Plan / UC Course  I have reviewed the triage vital signs and the nursing notes.  Pertinent labs & imaging results that were available during my care of the patient were reviewed by me and considered in my medical decision making (see chart for details).      Final Clinical Impressions(s) / UC Diagnoses   Final diagnoses:  COVID     Discharge Instructions      Return if any problems.    ED Prescriptions     Medication Sig Dispense Auth. Provider   molnupiravir EUA (LAGEVRIO) 200 mg CAPS capsule Take 4 capsules (800 mg total) by mouth 2 (two) times daily for 5 days. 40 capsule Fransico Meadow, [place]      PDMP not reviewed this encounter.   Fransico Meadow, [place] 06/21/21 [phone]

## 2021-06-21 NOTE — Discharge Instructions (Addendum)
Return if any problems.

## 2021-06-21 NOTE — Patient Instructions (Signed)
Visit Information  Following are the goals we discussed today:   Manage My Medication  Timeframe:  Long-Range Goal Priority:  High Start Date:      08/15/20                       Expected End Date:  06/14/22                    Follow Up Date feb 2023   - call for medicine refill 2 or 3 days before it runs out - call if I am sick and can't take my medicine - keep a list of all the medicines I take; vitamins and herbals too  -Continue monitoring sugars with Freestyle Libre -Do not skip meals. In the event of low sugar (<70), treat with 4 oz of orange juice or regular soda, wait 15 min and re-check sugar. If sugar is still not > 70, repeat process until it is.   Why is this important?   These steps will help you keep on track with your medicines.  Plan: Telephone follow up appointment with care management team member scheduled for:  3 months  The patient has been provided with contact information for the care management team and has been advised to call with any health related questions or concerns.   Tomasa Blase, PharmD Clinical Pharmacist, [facility]   Please call the care guide team at [phone] if you need to cancel or reschedule your appointment.   Patient verbalizes understanding of instructions provided today and agrees to view in [place].

## 2021-06-22 DIAGNOSIS — E118 Type 2 diabetes mellitus with unspecified complications: Secondary | ICD-10-CM

## 2021-06-22 DIAGNOSIS — I10 Essential (primary) hypertension: Secondary | ICD-10-CM

## 2021-06-22 DIAGNOSIS — N183 Chronic kidney disease, stage 3 unspecified: Secondary | ICD-10-CM

## 2021-06-25 ENCOUNTER — Ambulatory Visit (INDEPENDENT_AMBULATORY_CARE_PROVIDER_SITE_OTHER): Payer: 59 | Admitting: *Deleted

## 2021-06-25 ENCOUNTER — Ambulatory Visit: Payer: 59 | Admitting: Podiatry

## 2021-06-25 DIAGNOSIS — G5732 Lesion of lateral popliteal nerve, left lower limb: Secondary | ICD-10-CM | POA: Diagnosis not present

## 2021-06-25 DIAGNOSIS — G629 Polyneuropathy, unspecified: Secondary | ICD-10-CM | POA: Diagnosis not present

## 2021-06-25 DIAGNOSIS — E118 Type 2 diabetes mellitus with unspecified complications: Secondary | ICD-10-CM

## 2021-06-25 DIAGNOSIS — I10 Essential (primary) hypertension: Secondary | ICD-10-CM

## 2021-06-25 NOTE — Chronic Care Management (AMB) (Signed)
Chronic Care Management   CCM RN Visit Note  06/25/2021 Name: Dale Anderson MRN: [id number] DOB: [birth year]  Subjective: Dale Anderson is a 88 y.o. year old male who is a primary care patient of [name], MD. The care management team was consulted for assistance with disease management and care coordination needs.    Engaged with patient by telephone for follow up visit in response to provider referral for case management and/or care coordination services.   Consent to Services:  The patient was given information about Chronic Care Management services, agreed to services, and gave verbal consent prior to initiation of services.  Please see initial visit note for detailed documentation.  Patient agreed to services and verbal consent obtained.   Assessment: Review of patient past medical history, allergies, medications, health status, including review of consultants reports, laboratory and other test data, was performed as part of comprehensive evaluation and provision of chronic care management services.   SDOH (Social Determinants of Health) assessments and interventions performed:  SDOH Interventions    Flowsheet Row Most Recent Value  SDOH Interventions   Food Insecurity Interventions Intervention Not Indicated  Transportation Interventions Intervention Not Indicated  [Patient continues to drive self]      CCM Care Plan Allergies  Allergen Reactions   Lisinopril Cough   Outpatient Encounter Medications as of 06/25/2021  Medication Sig Note   ALPRAZolam (XANAX) 0.5 MG tablet TAKE 1 TABLET BY MOUTH DAILY AS NEEDED FOR ANXIETY (Patient taking differently: Take 0.5 mg by mouth daily as needed for anxiety.)    apixaban (ELIQUIS) 2.5 MG TABS tablet Take 1 tablet (2.5 mg total) by mouth 2 (two) times daily.    atorvastatin (LIPITOR) 20 MG tablet Take 1 tablet (20 mg total) by mouth daily.    blood glucose meter kit and supplies KIT Use to test blood sugar once daily.  DX: E11.9    Blood Glucose Monitoring Suppl (FREESTYLE LITE) DEVI     Cholecalciferol (VITAMIN D-3) 25 MCG (1000 UT) CAPS Take 1,000 Units by mouth daily.    Continuous Blood Gluc Receiver (FREESTYLE LIBRE 2 READER) DEVI USE AS DIRECTED    Continuous Blood Gluc Sensor (FREESTYLE LIBRE 2 SENSOR) MISC USE AS DIRECTED (Patient taking differently: Inject 1 Device into the skin every 14 (fourteen) days.) 05/14/2021: The patient IS wearing a sensor at this time (left arm)   glucose blood (FREESTYLE LITE) test strip USE TO CHECK BLOOD SUGAR ONCE DAILY    insulin glargine-yfgn (SEMGLEE, YFGN,) 100 UNIT/ML Pen Inject 10 units into the skin daily. 05/27/2021: Patient states he took 10 units    Insulin Pen Needle (UNIFINE PENTIPS) 31G X 6 MM MISC USE TO INJECT INSULIN 5 TIMES DAILY    L-Methylfolate-Algae-B12-B6 3-90.314-2-35 MG CAPS Take 1 capsule by mouth 2 (two) times daily. 05/14/2021: (Metanx)   Lancets MISC Use to test blood sugar once daily. DX E11.09    metoprolol succinate (TOPROL-XL) 25 MG 24 hr tablet Take 1 tablet (25 mg total) by mouth daily. Needs office visit.    molnupiravir EUA (LAGEVRIO) 200 mg CAPS capsule Take 4 capsules (800 mg total) by mouth 2 (two) times daily for 5 days.    Multiple Vitamins-Minerals (CENTRUM SILVER PO) Take 1 tablet by mouth daily with breakfast.    pantoprazole (PROTONIX) 40 MG tablet Take 1 tablet (40 mg total) by mouth daily.    Semaglutide,0.25 or 0.5MG/DOS, (OZEMPIC, 0.25 OR 0.5 MG/DOSE,) 2 MG/1.5ML SOPN Inject 0.5 mg into the skin once  a week on Sundays (Patient taking differently: Inject 0.5 mg into the skin every Sunday.)    tamsulosin (FLOMAX) 0.4 MG CAPS capsule TAKE 1 CAPSULE BY MOUTH DAILY. (Patient taking differently: Take 0.4 mg by mouth at bedtime.)    [DISCONTINUED] telmisartan (MICARDIS) 40 MG tablet TAKE 1 TABLET BY MOUTH DAILY.    No facility-administered encounter medications on file as of 06/25/2021.   Patient Active Problem List   Diagnosis Date  Noted   Rotator cuff insufficiency of left shoulder 12/28/2020   Encounter for general adult medical examination with abnormal findings 07/18/2020   Atrial fibrillation by electrocardiogram ([place]) 01/12/2020   Murmur, cardiac 01/04/2020   Bradycardia 01/04/2020   Internal hemorrhoid 12/08/2019   Tinea cruris 12/05/2019   Seborrheic dermatitis of scalp 05/05/2019   Diabetic polyneuropathy associated with type 2 diabetes mellitus ([place]) 03/29/2019   Seasonal allergic rhinitis due to pollen 05/24/2018   Sciatica 10/21/2017   Chronic renal disease, stage 3, moderately decreased glomerular filtration rate (GFR) between 30-59 mL/min/1.73 square meter ([place]) 07/02/2017   Routine general medical examination at a health care facility 12/26/2013   DDD (degenerative disc disease), cervical 12/26/2013   Hypertension    Hyperlipidemia with target LDL less than 100    Type II diabetes mellitus with manifestations (HCC)    GERD (gastroesophageal reflux disease)    DJD (degenerative joint disease)    Anxiety    Conditions to be addressed/monitored:  HTN and DMII  Care Plan : RN Care Manager Plan of Care  Updates made by Knox Royalty, RN since 06/25/2021 12:00 AM     Problem: Chronic Disease Management Needs   Priority: Medium     Long-Range Goal: Development of Plan of Care for long term chronic disease management   Start Date: 11/30/2020  Expected End Date: 11/30/2021  Priority: Medium  Note:   Current Barriers:  Chronic Disease Management support and education needs related to HTN and DMII  RNCM Clinical Goal(s):  Patient will demonstrate ongoing health management independence HTN; DMII  through collaboration with RN Care manager, provider, and care team.   Interventions: 1:1 collaboration with primary care provider regarding development and update of comprehensive plan of care as evidenced by provider attestation and co-signature Inter-disciplinary care team collaboration (see  longitudinal plan of care) Evaluation of current treatment plan related to  self management and patient's adherence to plan as established by provider Reviewed recent [facility] visit for COVID: patient reports "doing fine, feel fine;" reports has completely "recovered" and took medication as prescribed Reviewed recent outpatient surgery visit for lumbar disc herniation: reports "I am feeling fine, going to follow up visit this afternoon;" denies pain, new/ recent falls; not using assistive devices- reports "ready" to return to work at the end of this week: encouraged patient to be conservative in activity and not over-do as he resumes working; fall prevention measures reinforced Pain/ fall assessment updated: no concerns identified SDOH updated; depression screening updated: no concerns identified  Diabetes:  (Status: Goal on Track (progressing): YES.) Lab Results  Component Value Date   HGBA1C 7.1 (H) 03/18/2021  Reviewed prescribed diet with patient low sugar, carb-modified, heart healthy, low salt, low cholesterol; Discussed plans with patient for ongoing care management follow up and provided patient with direct contact information for care management team;      Reviewed scheduled/upcoming provider appointments including: 06/03/21- CCM pharmacy telephone visit;         Review of patient status, including review of consultants reports, relevant  laboratory and other test results, and medications completed;       Confirmed no recent changes to medications: patient denies question, concerns around medications, continues to independently self-manage Reviewed recent blood sugars at home: he reports "all blood sugars under good control;" reports monitors frequently using CGM: reports consistent blood sugars between 112-130, both fasting and post-prandial; denies lows < 80-- reports one recent isolated low of "80" states he "felt fine," and "just ate something" Reinforced signs/ symptoms low blood sugar/  corresponding action plan Confirmed no medication concerns, continues to report ongoing adherence to prescribed medications Reviewed upcoming scheduled provider appointments: today: post-surgical follow up visit; podiatry visit  Hypertension: (Status: Goal on Track (progressing): YES.) Last practice recorded BP readings:  BP Readings from Last 3 Encounters:  06/21/21 (!) 143/76  06/10/21 (!) 143/73  05/27/21 (!) 156/104  Most recent eGFR/CrCl: No results found for: EGFR  No components found for: CRCL  Evaluation of current treatment plan related to hypertension self management and patient's adherence to plan as established by provider;   Counseled on the importance of exercise goals with target of 150 minutes per week Discussed complications of poorly controlled blood pressure such as heart disease, stroke, circulatory complications, vision complications, kidney impairment, sexual dysfunction;  Blood pressures at home reviewed: he has been periodically monitoring at home; reports "blood pressures seem fine;" reports isolated high blood pressure of "160/90" denies symptoms around high blood pressure, states "feels great"  Patient Goals/Self-Care Activities: As evidenced by review of EHR, collaboration with care team, and patient reporting during CCM RN CM outreach, Patient Titus will: Take medications as prescribed   Attend all scheduled provider appointments  Call pharmacy for medication refills  Call provider (doctor) office for new concerns or questions  Continue to follow heart healthy, low salt, low-cholesterol, carbohydrate modified, low sugar diet Continue to monitor and record blood sugars at home daily Continue to monitor and record blood pressures at home regularly Continue to stay active Continue taking effort to prevent falls              Plan: Telephone follow up appointment with care management team member scheduled for:  Monday, August 19, 2021 at 9:00  am The patient has been provided with contact information for the care management team and has been advised to call with any health related questions or concerns  [name], RN, BSN, [place] [phone]: direct office

## 2021-06-25 NOTE — Patient Instructions (Addendum)
Visit Information  Dale Anderson, "[name]": thank you for taking time to talk with me today. Please don't hesitate to contact me if I can be of assistance to you before our next scheduled telephone appointment.  Below are the goals we discussed today:  Dale Self-Care Activities: Dale Anderson will: Take medications as prescribed   Attend all scheduled provider appointments  Call pharmacy for medication refills  Call provider (doctor) office for new concerns or questions  Continue to follow heart healthy, low salt, low-cholesterol, carbohydrate modified, low sugar diet Continue to monitor and record blood sugars at home daily Continue to monitor and record blood pressures at home regularly Continue to stay active Continue taking effort to prevent falls  Our next scheduled telephone follow up visit/ appointment with care management team member is scheduled on:  Monday, August 19, 2021 at 9:00 am  If you need to cancel or re-schedule our visit, please call [phone] and our care guide team will be happy to assist you.   I look forward to hearing about your progress.   Dale Rack, RN, BSN, [place] [phone]: direct office  If you are experiencing a Mental Health or [place] or need someone to talk to, please  call the Suicide and Crisis Lifeline: 988 call the [place] National Suicide Prevention Lifeline: 424-188-8925 or TTY: 6473721606 TTY 320-713-8454) to talk to a trained counselor call 1-800-273-TALK (toll free, 24 hour hotline) go to [facility] [address] [place] [phone]) call 911   The Dale verbalized understanding of instructions, educational materials, and care plan provided today and agreed to receive a mailed copy of Dale instructions, educational materials, and care plan  Diabetes Mellitus and Standards of [place]  with and managing diabetes (diabetes mellitus) can be complicated. Your diabetes treatment may be managed by a team of health care providers, including: A physician who specializes in diabetes (endocrinologist). You might also have visits with a nurse practitioner or physician assistant. Nurses. A registered dietitian. A certified diabetes care and education specialist. An exercise specialist. A pharmacist. An eye doctor. A foot specialist (podiatrist). A dental care provider. A primary care provider. A mental health care provider. How to manage your diabetes You can do many things to successfully manage your diabetes. Your health care providers will follow guidelines to help you get the best quality of care. Here are general guidelines for your diabetes management plan. Your health care providers may give you more specific instructions. Physical exams When you are diagnosed with diabetes, and each year after that, your health care provider will ask about your medical and family history. You will have a physical exam, which may include: Measuring your height, weight, and body mass index (BMI). Checking your blood pressure. This will be done at every routine medical visit. Your target blood pressure may vary depending on your medical conditions, your age, and other factors. A thyroid exam. A skin exam. Screening for nerve damage (peripheral neuropathy). This may include checking the pulse in your legs and feet and the level of sensation in your hands and feet. A foot exam to inspect the structure and skin of your feet, including checking for cuts, bruises, redness, blisters, sores, or other problems. Screening for blood vessel (vascular) problems. This may include checking the pulse in your legs and feet and checking your temperature. Blood tests Depending on your treatment plan and your personal needs, you may  have the following tests: Hemoglobin A1C (HbA1C). This test provides information  about blood sugar (glucose) control over the previous 2-3 months. It is used to adjust your treatment plan, if needed. This test will be done: At least 2 times a year, if you are meeting your treatment goals. 4 times a year, if you are not meeting your treatment goals or if your goals have changed. Lipid testing, including total cholesterol, LDL and HDL cholesterol, and triglyceride levels. The goal for LDL is less than 100 mg/dL (5.5 mmol/L). If you are at high risk for complications, the goal is less than 70 mg/dL (3.9 mmol/L). The goal for HDL is 40 mg/dL (2.2 mmol/L) or higher for men, and 50 mg/dL (2.8 mmol/L) or higher for women. An HDL cholesterol of 60 mg/dL (3.3 mmol/L) or higher gives some protection against heart disease. The goal for triglycerides is less than 150 mg/dL (8.3 mmol/L). Liver function tests. Kidney function tests. Thyroid function tests.  Dental and eye exams  Visit your dentist two times a year. If you have type 1 diabetes, your health care provider may recommend an eye exam within 5 years after you are diagnosed, and then once a year after your first exam. For children with type 1 diabetes, the health care provider may recommend an eye exam when your child is age 61 or older and has had diabetes for 3-5 years. After the first exam, your child should get an eye exam once a year. If you have type 2 diabetes, your health care provider may recommend an eye exam as soon as you are diagnosed, and then every 1-2 years after your first exam. Immunizations A yearly flu (influenza) vaccine is recommended annually for everyone 6 months or older. This is especially important if you have diabetes. The pneumonia (pneumococcal) vaccine is recommended for everyone 2 years or older who has diabetes. If you are age 69 or older, you may get the pneumonia vaccine as a series of two separate shots. The hepatitis B vaccine is recommended for adults shortly after being diagnosed with  diabetes. Adults and children with diabetes should receive all other vaccines according to age-specific recommendations from the Centers for Disease Control and Prevention (CDC). Mental and emotional health Screening for symptoms of eating disorders, anxiety, and depression is recommended at the time of diagnosis and after as needed. If your screening shows that you have symptoms, you may need more evaluation. You may work with a mental health care provider. Follow these instructions at home: Treatment plan You will monitor your blood glucose levels and may give yourself insulin. Your treatment plan will be reviewed at every medical visit. You and your health care provider will discuss: How you are taking your medicines, including insulin. Any side effects you have. Your blood glucose level target goals. How often you monitor your blood glucose level. Lifestyle habits, such as activity level and tobacco, alcohol, and substance use. Education Your health care provider will assess how well you are monitoring your blood glucose levels and whether you are taking your insulin and medicines correctly. He or she may refer you to: A certified diabetes care and education specialist to manage your diabetes throughout your life, starting at diagnosis. A registered dietitian who can create and review your personal nutrition plan. An exercise specialist who can discuss your activity level and exercise plan. General instructions Take over-the-counter and prescription medicines only as told by your health care provider. Keep all follow-up visits. This is important. Where to  find support There are many diabetes support networks, including: American Diabetes Association (ADA): diabetes.org Defeat Diabetes Foundation: defeatdiabetes.org Where to find more information American Diabetes Association (ADA): www.diabetes.org Association of Diabetes Care & Education Specialists (ADCES):  diabeteseducator.org International Diabetes Federation (IDF): https://www.munoz-bell.org/ Summary Managing diabetes (diabetes mellitus) can be complicated. Your diabetes treatment may be managed by a team of health care providers. Your health care providers follow guidelines to help you get the best quality care. You should have physical exams, blood tests, blood pressure monitoring, immunizations, and screening tests regularly. Stay updated on how to manage your diabetes. Your health care providers may also give you more specific instructions based on your individual health. This information is not intended to replace advice given to you by your health care provider. Make sure you discuss any questions you have with your health care provider. Document Revised: 12/15/2019 Document Reviewed: 12/15/2019 Elsevier Dale Education  2022 [place] for Falls Each year, millions of people have serious injuries from falls. It is important to understand your risk for falling. Talk with your health care provider about your risk and what you can do to lower it. There are actions you can take at home to lower your risk and prevent falls. If you do have a serious fall, make sure to tell your health care provider. Falling once raises your risk of falling again. How can falls affect me? Serious injuries from falls are common. These include: Broken bones, such as hip fractures. Head injuries, such as traumatic brain injuries (TBI) or concussion. A fear of falling can cause you to avoid activities and stay at home. This can make your muscles weaker and actually raise your risk for a fall. What can increase my risk? There are a number of risk factors that increase your risk for falling. The more risk factors you have, the higher your risk of falling. Serious injuries from a fall happen most often to people older than age 67. Children and young adults ages 70-29 are also at higher risk. Common risk  factors include: Weakness in the lower body. Lack (deficiency) of vitamin D. Being generally weak or confused due to long-term (chronic) illness. Dizziness or balance problems. Poor vision. Medicines that cause dizziness or drowsiness. These can include medicines for your blood pressure, heart, anxiety, insomnia, or edema, as well as pain medicines and muscle relaxants. Other risk factors include: Drinking alcohol. Having had a fall in the past. Having depression. Having foot pain or wearing improper footwear. Working at a dangerous job. Having any of the following in your home: Tripping hazards, such as floor clutter or loose rugs. Poor lighting. Pets. Having dementia or memory loss. What actions can I take to lower my risk of falling?   Physical activity Maintain physical fitness. Do strength and balance exercises. Consider taking a regular class to build strength and balance. Yoga and tai chi are good options. Vision Have your eyes checked every year and your vision prescription updated as needed. Walking aids and footwear Wear nonskid shoes. Do not wear high heels. Do not walk around the house in socks or slippers. Use a cane or walker as told by your health care provider. Home safety Attach secure railings on both sides of your stairs. Install grab bars for your tub, shower, and toilet. Use a bath mat in your tub or shower. Use good lighting in all rooms. Keep a flashlight near your bed. Make sure there is a clear path from your bed  to the bathroom. Use night-lights. Do not use throw rugs. Make sure all carpeting is taped or tacked down securely. Remove all clutter from walkways and stairways, including extension cords. Repair uneven or broken steps. Avoid walking on icy or slippery surfaces. Walk on the grass instead of on icy or slick sidewalks. Use ice melt to get rid of ice on walkways. Use a cordless phone. Questions to ask your health care provider Can you help me  check my risk for a fall? Do any of my medicines make me more likely to fall? Should I take a vitamin D supplement? What exercises can I do to improve my strength and balance? Should I make an appointment to have my vision checked? Do I need a bone density test to check for weak bones or osteoporosis? Would it help to use a cane or a walker? Where to find more information Centers for Disease Control and Prevention, STEADI: http://www.wolf.info/ Community-Based Fall Prevention Programs: http://www.wolf.info/ National Institute on Aging: http://kim-miller.com/ Contact a health care provider if: You fall at home. You are afraid of falling at home. You feel weak, drowsy, or dizzy. Summary Serious injuries from a fall happen most often to people older than age 80. Children and young adults ages 65-29 are also at higher risk. Talk with your health care provider about your risks for falling and how to lower those risks. Taking certain precautions at home can lower your risk for falling. If you fall, always tell your health care provider. This information is not intended to replace advice given to you by your health care provider. Make sure you discuss any questions you have with your health care provider. Document Revised: 01/11/2020 Document Reviewed: 01/11/2020 Elsevier Dale Education  [place].

## 2021-06-25 NOTE — Telephone Encounter (Signed)
Noted  

## 2021-07-05 ENCOUNTER — Telehealth: Payer: Self-pay

## 2021-07-05 NOTE — Progress Notes (Signed)
Chronic Care Management Pharmacy Assistant   Name: Dale Anderson  MRN: [id number] DOB: [birth year]   Reason for Encounter: Disease State  Appt: 09/19/21 @ 9 am with Linna Hoff  Conditions to be addressed/monitored: DMII   Recent office visits:  None ID  Recent consult visits:  06/21/21 Urgent Care for Covid at [facility] [place], med given [place] mg  Hospital visits:  Medication Reconciliation was completed by comparing discharge summary, patients EMR and Pharmacy Anderson, and upon discussion with patient.  Admitted to the hospital on 05/27/21 due to Left foot drop. Discharge date was 05/27/21. Discharged from [facility].    Medications that remain the same after Hospital Discharge:??  -All other medications will remain the same.    Medications: Outpatient Encounter Medications as of 07/05/2021  Medication Sig Note   ALPRAZolam (XANAX) 0.5 MG tablet TAKE 1 TABLET BY MOUTH DAILY AS NEEDED FOR ANXIETY (Patient taking differently: Take 0.5 mg by mouth daily as needed for anxiety.)    apixaban (ELIQUIS) 2.5 MG TABS tablet Take 1 tablet (2.5 mg total) by mouth 2 (two) times daily.    atorvastatin (LIPITOR) 20 MG tablet Take 1 tablet (20 mg total) by mouth daily.    blood glucose meter kit and supplies KIT Use to test blood sugar once daily. DX: E11.9    Blood Glucose Monitoring Suppl (FREESTYLE LITE) DEVI     Cholecalciferol (VITAMIN D-3) 25 MCG (1000 UT) CAPS Take 1,000 Units by mouth daily.    Continuous Blood Gluc Receiver (FREESTYLE LIBRE 2 READER) DEVI USE AS DIRECTED    Continuous Blood Gluc Sensor (FREESTYLE LIBRE 2 SENSOR) MISC USE AS DIRECTED (Patient taking differently: Inject 1 Device into the skin every 14 (fourteen) days.) 05/14/2021: The patient IS wearing a sensor at this time (left arm)   glucose blood (FREESTYLE LITE) test strip USE TO CHECK BLOOD SUGAR ONCE DAILY    insulin glargine-yfgn (SEMGLEE, YFGN,) 100 UNIT/ML Pen  Inject 10 units into the skin daily. 05/27/2021: Patient states he took 10 units    Insulin Pen Needle (UNIFINE PENTIPS) 31G X 6 MM MISC USE TO INJECT INSULIN 5 TIMES DAILY    L-Methylfolate-Algae-B12-B6 3-90.314-2-35 MG CAPS Take 1 capsule by mouth 2 (two) times daily. 05/14/2021: (Metanx)   Lancets MISC Use to test blood sugar once daily. DX E11.09    metoprolol succinate (TOPROL-XL) 25 MG 24 hr tablet Take 1 tablet (25 mg total) by mouth daily. Needs office visit.    Multiple Vitamins-Minerals (CENTRUM SILVER PO) Take 1 tablet by mouth daily with breakfast.    pantoprazole (PROTONIX) 40 MG tablet Take 1 tablet (40 mg total) by mouth daily.    Semaglutide,0.25 or 0.5MG/DOS, (OZEMPIC, 0.25 OR 0.5 MG/DOSE,) 2 MG/1.5ML SOPN Inject 0.5 mg into the skin once a week on Sundays (Patient taking differently: Inject 0.5 mg into the skin every Sunday.)    tamsulosin (FLOMAX) 0.4 MG CAPS capsule TAKE 1 CAPSULE BY MOUTH DAILY. (Patient taking differently: Take 0.4 mg by mouth at bedtime.)    [DISCONTINUED] telmisartan (MICARDIS) 40 MG tablet TAKE 1 TABLET BY MOUTH DAILY.    No facility-administered encounter medications on file as of 07/05/2021.   Recent Relevant Labs: Lab Results  Component Value Date/Time   HGBA1C 7.1 (H) 03/18/2021 04:11 PM   HGBA1C 7.9 (H) 10/17/2020 04:00 PM   HGBA1C 7.1 01/25/2016 12:00 AM   MICROALBUR 10.0 (H) 10/17/2020 04:00 PM   MICROALBUR 12.5 (H) 09/22/2019 10:34  AM    Kidney Function Lab Results  Component Value Date/Time   CREATININE 1.78 (H) 05/14/2021 04:30 PM   CREATININE 1.72 (H) 03/18/2021 04:11 PM   CREATININE 1.56 (H) 01/04/2020 09:33 AM   GFR 34.97 (L) 03/18/2021 04:11 PM   GFRNONAA 36 (L) 05/14/2021 04:30 PM   GFRAA 65 (L) 05/19/2012 04:30 PM    Current antihyperglycemic regimen:  Semglee 10 units daily (free with insurance) Ozempic 0.5 mg daily on Sundays Freestyle Libre 2  What recent interventions/DTPs have been made to improve glycemic control:   None noted  Have there been any recent hospitalizations or ED visits since last visit with CPP? Yes, 05/27/21  Patient denies hypoglycemic symptoms, including None  Patient denies hyperglycemic symptoms, including none  How often are you checking your blood sugar? once daily  What are your blood sugars ranging? 80-124  During the week, how often does your blood glucose drop below 70? Never  Are you checking your feet daily/regularly? Patient states that his feet stay numb most times. Left foot is dragging the ground when he walks.States that he did fall once because his foot didn't lift up so he fell  Adherence Review: Is the patient currently on a STATIN medication? Yes Is the patient currently on ACE/ARB medication? No Does the patient have >5 day gap between last estimated fill dates? Yes    Care Gaps: Colonoscopy-NA Diabetic Foot Exam-12/28/20 Ophthalmology-12/10/18 Dexa Scan - NA Annual Well Visit - NA Micro albumin-10/17/20 Hemoglobin A1c-03/18/21  Star Rating Drugs: Atorvastatin 20 mg-last fill 02/14/21  Ethelene Hal Clinical Pharmacist Assistant [phone]

## 2021-07-10 ENCOUNTER — Other Ambulatory Visit: Payer: Self-pay | Admitting: Internal Medicine

## 2021-07-10 ENCOUNTER — Other Ambulatory Visit (HOSPITAL_COMMUNITY): Payer: Self-pay

## 2021-07-10 DIAGNOSIS — I10 Essential (primary) hypertension: Secondary | ICD-10-CM

## 2021-07-10 DIAGNOSIS — I4891 Unspecified atrial fibrillation: Secondary | ICD-10-CM

## 2021-07-10 DIAGNOSIS — E118 Type 2 diabetes mellitus with unspecified complications: Secondary | ICD-10-CM

## 2021-07-11 DIAGNOSIS — M21372 Foot drop, left foot: Secondary | ICD-10-CM | POA: Diagnosis not present

## 2021-07-15 ENCOUNTER — Other Ambulatory Visit (HOSPITAL_COMMUNITY): Payer: Self-pay

## 2021-07-15 DIAGNOSIS — N401 Enlarged prostate with lower urinary tract symptoms: Secondary | ICD-10-CM | POA: Diagnosis not present

## 2021-07-15 DIAGNOSIS — R351 Nocturia: Secondary | ICD-10-CM | POA: Diagnosis not present

## 2021-07-15 MED ORDER — TAMSULOSIN HCL 0.4 MG PO CAPS
0.4000 mg | ORAL_CAPSULE | Freq: Every day | ORAL | 3 refills | Status: DC
Start: 2021-07-15 — End: 2022-10-20
  Filled 2021-07-15: qty 90, 90d supply, fill #0

## 2021-07-16 ENCOUNTER — Other Ambulatory Visit (HOSPITAL_COMMUNITY): Payer: Self-pay

## 2021-07-23 DIAGNOSIS — E118 Type 2 diabetes mellitus with unspecified complications: Secondary | ICD-10-CM | POA: Diagnosis not present

## 2021-07-23 DIAGNOSIS — I10 Essential (primary) hypertension: Secondary | ICD-10-CM | POA: Diagnosis not present

## 2021-07-30 ENCOUNTER — Other Ambulatory Visit (HOSPITAL_COMMUNITY): Payer: Self-pay

## 2021-08-08 ENCOUNTER — Other Ambulatory Visit (HOSPITAL_COMMUNITY): Payer: Self-pay

## 2021-08-08 MED FILL — Continuous Glucose System Receiver: 30 days supply | Qty: 1 | Fill #0 | Status: AC

## 2021-08-19 ENCOUNTER — Ambulatory Visit (INDEPENDENT_AMBULATORY_CARE_PROVIDER_SITE_OTHER): Payer: 59 | Admitting: *Deleted

## 2021-08-19 DIAGNOSIS — E118 Type 2 diabetes mellitus with unspecified complications: Secondary | ICD-10-CM

## 2021-08-19 DIAGNOSIS — I10 Essential (primary) hypertension: Secondary | ICD-10-CM

## 2021-08-19 NOTE — Chronic Care Management (AMB) (Signed)
Chronic Care Management   CCM RN Visit Note  08/19/2021 Name: Dale Anderson MRN: [id number] DOB: [birth year]  Subjective: Dale Anderson is a 88 y.o. year old male who is a primary care patient of [name], MD. The care management team was consulted for assistance with disease management and care coordination needs.    Engaged with patient's spouse/ caregiver [name], on [place] by telephone for follow up visit in response to provider referral for case management and/or care coordination services.   Consent to Services:  The patient was given information about Chronic Care Management services, agreed to services, and gave verbal consent prior to initiation of services.  Please see initial visit note for detailed documentation.  Patient agreed to services and verbal consent obtained.   Assessment: Review of patient past medical history, allergies, medications, health status, including review of consultants reports, laboratory and other test data, was performed as part of comprehensive evaluation and provision of chronic care management services.   SDOH (Social Determinants of Health) assessments and interventions performed:  SDOH Interventions    Flowsheet Row Most Recent Value  SDOH Interventions   Food Insecurity Interventions Intervention Not Indicated  [Continues to deny food insecurity]  Transportation Interventions Intervention Not Indicated  [Continues to drive self]      CCM Care Plan  Allergies  Allergen Reactions   Lisinopril Cough   Outpatient Encounter Medications as of 08/19/2021  Medication Sig Note   ALPRAZolam (XANAX) 0.5 MG tablet TAKE 1 TABLET BY MOUTH DAILY AS NEEDED FOR ANXIETY (Patient taking differently: Take 0.5 mg by mouth daily as needed for anxiety.)    apixaban (ELIQUIS) 2.5 MG TABS tablet Take 1 tablet (2.5 mg total) by mouth 2 (two) times daily.    atorvastatin (LIPITOR) 20 MG tablet Take 1 tablet (20 mg total) by mouth daily.    blood  glucose meter kit and supplies KIT Use to test blood sugar once daily. DX: E11.9    Blood Glucose Monitoring Suppl (FREESTYLE LITE) DEVI     Cholecalciferol (VITAMIN D-3) 25 MCG (1000 UT) CAPS Take 1,000 Units by mouth daily.    Continuous Blood Gluc Receiver (FREESTYLE LIBRE 2 READER) DEVI USE AS DIRECTED    Continuous Blood Gluc Sensor (FREESTYLE LIBRE 2 SENSOR) MISC USE AS DIRECTED (Patient taking differently: Inject 1 Device into the skin every 14 (fourteen) days.) 05/14/2021: The patient IS wearing a sensor at this time (left arm)   glucose blood (FREESTYLE LITE) test strip USE TO CHECK BLOOD SUGAR ONCE DAILY    insulin glargine-yfgn (SEMGLEE, YFGN,) 100 UNIT/ML Pen Inject 10 units into the skin daily. 05/27/2021: Patient states he took 10 units    Insulin Pen Needle (UNIFINE PENTIPS) 31G X 6 MM MISC USE TO INJECT INSULIN 5 TIMES DAILY    L-Methylfolate-Algae-B12-B6 3-90.314-2-35 MG CAPS Take 1 capsule by mouth 2 (two) times daily. 05/14/2021: (Metanx)   Lancets MISC Use to test blood sugar once daily. DX E11.09    metoprolol succinate (TOPROL-XL) 25 MG 24 hr tablet Take 1 tablet (25 mg total) by mouth daily. Needs office visit.    Multiple Vitamins-Minerals (CENTRUM SILVER PO) Take 1 tablet by mouth daily with breakfast.    pantoprazole (PROTONIX) 40 MG tablet Take 1 tablet (40 mg total) by mouth daily.    Semaglutide,0.25 or 0.5MG/DOS, (OZEMPIC, 0.25 OR 0.5 MG/DOSE,) 2 MG/1.5ML SOPN Inject 0.5 mg into the skin once a week on Sundays (Patient taking differently: Inject 0.5 mg into the  every Sunday.)   ° tamsulosin (FLOMAX) 0.4 MG CAPS capsule Take 1 capsule (0.4 mg total) by mouth daily.   ° [DISCONTINUED] telmisartan (MICARDIS) 40 MG tablet TAKE 1 TABLET BY MOUTH DAILY.   ° °No facility-administered encounter medications on file as of 08/19/2021.  ° °Patient Active Problem List  ° Diagnosis Date Noted  ° Rotator cuff insufficiency of left shoulder 12/28/2020  ° Encounter for general adult  medical examination with abnormal findings 07/18/2020  ° Atrial fibrillation by electrocardiogram (HCC) 01/12/2020  ° Murmur, cardiac 01/04/2020  ° Bradycardia 01/04/2020  ° Internal hemorrhoid 12/08/2019  ° Tinea cruris 12/05/2019  ° Seborrheic dermatitis of scalp 05/05/2019  ° Diabetic polyneuropathy associated with type 2 diabetes mellitus (HCC) 03/29/2019  ° Seasonal allergic rhinitis due to pollen 05/24/2018  ° Sciatica 10/21/2017  ° Chronic renal disease, stage 3, moderately decreased glomerular filtration rate (GFR) between 30-59 mL/min/1.73 square meter (HCC) 07/02/2017  ° Routine general medical examination at a health care facility 12/26/2013  ° DDD (degenerative disc disease), cervical 12/26/2013  ° Hypertension   ° Hyperlipidemia with target LDL less than 100   ° Type II diabetes mellitus with manifestations (HCC)   ° GERD (gastroesophageal reflux disease)   ° DJD (degenerative joint disease)   ° Anxiety   ° °Conditions to be addressed/monitored:  HTN and DMII ° °Care Plan : RN Care Manager Plan of Care  °Updates made by Tousey, Laine M, RN since 08/19/2021 12:00 AM  °  ° °Problem: Chronic Disease Management Needs   °Priority: Medium  °  ° °Long-Range Goal: Ongoing adherence to established plan of care for long term chronic disease management   °Start Date: 11/30/2020  °Expected End Date: 11/30/2021  °Priority: Medium  °Note:   °Current Barriers:  °Chronic Disease Management support and education needs related to HTN and DMII ° °RNCM Clinical Goal(s):  °Patient will demonstrate ongoing health management independence HTN; DMII  through collaboration with RN Care manager, provider, and care team.  ° °Interventions: °1:1 collaboration with primary care provider regarding development and update of comprehensive plan of care as evidenced by provider attestation and co-signature °Inter-disciplinary care team collaboration (see longitudinal plan of care) °Evaluation of current treatment plan related to  self  management and patient's adherence to plan as established by provider °08/19/21: °SDOH updated: no new concerns or unmet needs identified °Pain assessment updated: spouse/ caregiver denies that patient is in acute/ chronic pain °Falls assessment updated: spouse reports new/ recent fall, "sometime in December or January;" without injury, denies falls since then, reports patient was provided with a special insert/ boot that has helped; denies use of assistive devices; patient continues working full time, and is "walking much better;" positive reinforcement provided with encouragement to continue efforts; previously provided education around fall risk/ prevention reinforced °Reviewed upcoming scheduled provider appointments: CCM pharmacy team 09/19/21; made spouse aware that patient will be due to have follow up PCP office visit in late March/ early April- by signing of chart, spouse has made this appointment, scheduled for 09/26/21 °Confirmed no recent changes to medications: spouse denies question, concerns around medications, reports patient continues to independently self-manage °Discussed overall clinical condition with patient's spouse: she reports, "he is doing really great" ° °Diabetes:  (Status: 08/19/21: Goal on Track (progressing): YES.) Long Term Goal °Lab Results  °Component Value Date  ° HGBA1C 7.1 (H) 03/18/2021  °Reviewed prescribed diet with patient low sugar, carb-modified, heart healthy, low salt, low cholesterol; °Discussed plans with patient for ongoing   ongoing care management follow up and provided patient with direct contact information for care management team;      Review of patient status, including review of consultants reports, relevant laboratory and other test results, and medications completed;       Confirmed patient continues using CGM, spouse does not have specific values to review- patient is at work and I confirmed he continues independently monitors blood sugars throughout day Confirmed  patient following heart healthy/ low salt/ low cholesterol, low sugar/ carbohydrate diet: spouse reports helping patient to adhere to diet Confirmed per spouse no recent low blood sugar episodes  Hypertension: (Status: 08/19/21: Goal on Track (progressing): YES.) Long Term Goal Last practice recorded BP readings:  BP Readings from Last 3 Encounters:  06/21/21 (!) 143/76  06/10/21 (!) 143/73  05/27/21 (!) 156/104  Most recent eGFR/CrCl: No results found for: EGFR  No components found for: CRCL  Evaluation of current treatment plan related to hypertension self management and patient's adherence to plan as established by provider;   Counseled on the importance of exercise goals with target of 150 minutes per week Discussed complications of poorly controlled blood pressure such as heart disease, stroke, circulatory complications, vision complications, kidney impairment, sexual dysfunction;  Spouse does not have specific blood pressures from home monitoring to review, however she confirms 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" will: Take medications as prescribed   Attend all scheduled provider appointments  Call pharmacy for medication refills  Call provider (doctor) office for new concerns or questions  Continue to follow heart healthy, low salt, low-cholesterol, carbohydrate modified, low sugar diet Continue to monitor and record blood sugars at home daily Continue to monitor and record blood pressures at home regularly Continue to stay active Continue taking effort to prevent falls          Plan: Telephone follow up appointment with care management  team member scheduled for:  Wednesday October 09, 2021 at 9:00 am The patient has been provided with contact information for the care management team and has been advised to call with any health related questions or concerns  [name], RN, BSN, [place] [phone]: direct office

## 2021-08-19 NOTE — Patient Instructions (Addendum)
Visit Information  Dale Anderson, "[name]," thank Dale Anderson for taking time to update me today on your health care needs. Please don't hesitate to contact me if I can be of assistance to you before our next scheduled telephone appointment.  Below are the goals we discussed today:  Patient Self-Care Activities: Patient Dale "[name]" will: Take medications as prescribed   Attend all scheduled provider appointments  Call pharmacy for medication refills  Call provider (doctor) office for new concerns or questions  Continue to follow heart healthy, low salt, low-cholesterol, carbohydrate modified, low sugar diet Continue to monitor and record blood sugars at home daily Continue to monitor and record blood pressures at home regularly Continue to stay active Continue taking effort to prevent falls  Our next scheduled telephone follow up visit/ appointment is scheduled on:   Wednesday, October 09, 2021 at 9:00 am- This is a PHONE [place] appointment  If you need to cancel or re-schedule our visit, please call [phone] and our care guide team will be happy to assist you.   I look forward to hearing about your progress.   Dale Rack, RN, BSN, [place] [phone]: direct office  If you are experiencing a Mental Health or [place] or need someone to talk to, please  call the Suicide and Crisis Lifeline: 988 call the [place] National Suicide Prevention Lifeline: 667-884-0520 or TTY: (306)609-7214 TTY 636-546-0766) to talk to a trained counselor call 1-800-273-TALK (toll free, 24 hour hotline) go to [facility] [address] [place] [phone]) call 911   The patient verbalized understanding of instructions, educational materials, and care plan provided today and agreed to receive a mailed copy of patient instructions, educational materials, and care plan    Living With  Diabetes Diabetes (type 1 diabetes mellitus or type 2 diabetes mellitus) is a condition in which the body does not have enough of a hormone called insulin, or the body does not respond properly to insulin. Normally, insulin allows sugars (glucose) to enter cells in the body. With diabetes, extra glucose builds up in the blood instead of going into cells. This results in high blood glucose (hyperglycemia). How to manage lifestyle changes Managing diabetes includes medical treatments as well as lifestyle changes. If diabetes is not managed well, serious physical and emotional complications can occur. Taking good care of yourself means that you are responsible for: Monitoring glucose regularly. Eating a healthy diet. Exercising regularly. Meeting with health care providers. Taking medicines as directed. Most people feel some stress about managing their diabetes. When this stress becomes too much, it is known as diabetes-related distress. This is very common. Living with diabetes can place you at risk for diabetes distress, depression, or anxiety. These disorders can make diabetes more difficult to manage. How to recognize stress You may have diabetes distress if you: Avoid or ignore your daily diabetes care. This includes glucose testing, following a meal plan, and taking medications. Feel overwhelmed by your daily diabetes care. Experience emotional reactions such as anger, sadness, or fear related to your daily diabetes care. Feel fear or shame about not doing everything perfectly that you have been told to do. Emotional distress Symptoms of diabetes distress include: Anger about having a diagnosis of diabetes. Fear or frustration about your diagnosis and the changes you need to make to manage the condition. Being overly worried about the care that you need or the cost of the care  that you need. Feeling like you caused your condition by doing something wrong. Fear about unpredictable  fluctuations in your blood glucose, like low or high blood glucose. Feeling judged by your health care providers. Feeling very alone with the disease. Depression Having diabetes means that you are at a higher risk for depression. Your health care provider may test (screen) you for symptoms of depression. It is important to recognize symptoms and to start treatment for depression soon after it is diagnosed. The following are some symptoms of depression: Loss of interest in things that you used to enjoy. Feeling depressed much or most of the time. A change in appetite. Trouble getting to sleep or staying asleep. Feeling tired most of the day. Feeling nervous and anxious. Feeling guilty and worrying that you are a burden to others. Having thoughts of hurting yourself or feeling that you want to die. If you have any of these symptoms, more days than not, for 2 weeks or longer, you may have depression. This would be a good time to contact your health care provider. Follow these instructions at home: Managing diabetes distress The following are some ways to manage emotional distress: Learn as much as you can about diabetes and its treatment. Take one step at a time to improve your management. Meet with a certified diabetes care and education specialist. Take a class to learn how to manage your condition. Consider working with a counselor or therapist. Keep a journal of your thoughts and concerns. Accept that some things are out of your control. Talk with other people who have diabetes. It can help to talk about the distress that you feel. Find ways to manage stress that work for you. These may include art or music therapy, exercise, meditation, and hobbies. Seek support from spiritual leaders, family, and friends.  General instructions Do your best to follow your diabetes management plan. If you are struggling to follow your plan, talk with a certified diabetes care and education specialist, or  with someone else who has diabetes. They may have ideas that will help. Forgive yourself for not being perfect. Almost everyone struggles with the tasks of diabetes. Keep all follow-up visits. This is important. Where to find support Search for information and support from the American Diabetes Association: www.diabetes.org Find a certified diabetes education and care specialist. Make an appointment through the Association of Diabetes Care & Education Specialists: www.diabeteseducator.org Contact a health care provider if: You believe your diabetes is getting out of control. You are concerned you may be depressed. You think your medications are not helping control your diabetes. You are feeling overwhelmed with your diabetes. Get help right away if: You have thoughts about hurting yourself or others. If you ever feel like you may hurt yourself or others, or have thoughts about taking your own life, get help right away. You can go to your nearest emergency department or call: Your local emergency services (911 in the [place] A suicide crisis helpline, such as the [place] at (310)132-3021 or 988 in the [place]. This is open 24 hours a day. Summary Diabetes (type 1 diabetes mellitus or type 2 diabetes mellitus) is a condition in which the body does not have enough of a hormone called insulin, or the body does not respond properly to insulin. Living with diabetes puts you at risk for medical and emotional issues, such as diabetes distress, depression, and anxiety. Recognizing the symptoms of diabetes distress and depression may help you avoid problems with your  diabetes control. If you experience symptoms, it is important to discuss this with your health care provider, certified diabetes care and education specialist, or therapist. It is important to start treatment for diabetes distress and depression soon after diagnosis. Ask your health care provider to recommend a  therapist who understands both depression and diabetes. This information is not intended to replace advice given to you by your health care provider. Make sure you discuss any questions you have with your health care provider. Document Revised: 01/02/2021 Document Reviewed: 10/20/2019 Elsevier Patient Education  [place].

## 2021-08-20 DIAGNOSIS — I10 Essential (primary) hypertension: Secondary | ICD-10-CM

## 2021-08-20 DIAGNOSIS — E118 Type 2 diabetes mellitus with unspecified complications: Secondary | ICD-10-CM

## 2021-09-02 DIAGNOSIS — E559 Vitamin D deficiency, unspecified: Secondary | ICD-10-CM | POA: Diagnosis not present

## 2021-09-02 DIAGNOSIS — E1122 Type 2 diabetes mellitus with diabetic chronic kidney disease: Secondary | ICD-10-CM | POA: Diagnosis not present

## 2021-09-02 DIAGNOSIS — I4891 Unspecified atrial fibrillation: Secondary | ICD-10-CM | POA: Diagnosis not present

## 2021-09-02 DIAGNOSIS — I129 Hypertensive chronic kidney disease with stage 1 through stage 4 chronic kidney disease, or unspecified chronic kidney disease: Secondary | ICD-10-CM | POA: Diagnosis not present

## 2021-09-02 DIAGNOSIS — N1831 Chronic kidney disease, stage 3a: Secondary | ICD-10-CM | POA: Diagnosis not present

## 2021-09-10 ENCOUNTER — Other Ambulatory Visit (HOSPITAL_COMMUNITY): Payer: Self-pay

## 2021-09-10 ENCOUNTER — Telehealth: Payer: Self-pay

## 2021-09-10 ENCOUNTER — Other Ambulatory Visit: Payer: Self-pay | Admitting: Internal Medicine

## 2021-09-10 DIAGNOSIS — I4891 Unspecified atrial fibrillation: Secondary | ICD-10-CM

## 2021-09-10 MED ORDER — APIXABAN 2.5 MG PO TABS
2.5000 mg | ORAL_TABLET | Freq: Two times a day (BID) | ORAL | 1 refills | Status: DC
  Filled 2021-09-10: qty 180, 90d supply, fill #0

## 2021-09-10 NOTE — Telephone Encounter (Signed)
Pt is requesting a refill: ?apixaban (ELIQUIS) 2.5 MG TABS tablet ? ?Pharmacy:  ?[facility] ? ?LOV 03/18/21 ?ROV 09/26/21 ?

## 2021-09-18 ENCOUNTER — Other Ambulatory Visit (HOSPITAL_COMMUNITY): Payer: Self-pay

## 2021-09-18 ENCOUNTER — Other Ambulatory Visit: Payer: Self-pay | Admitting: Internal Medicine

## 2021-09-18 DIAGNOSIS — E118 Type 2 diabetes mellitus with unspecified complications: Secondary | ICD-10-CM

## 2021-09-19 ENCOUNTER — Telehealth: Payer: 59

## 2021-09-19 NOTE — Progress Notes (Deleted)
? ?Chronic Care Management ?Pharmacy Note ? ?09/19/2021 ?Name:  Dale Anderson MRN:  [id number] DOB:  1929/11/21 ? ?Summary: ?-Patient's wife recently tested positive for COVID 06/19/2021 - Patient has not tested since wife was positive, noted to have fatigue, cough, and decreased appetite - denies SOB / loss of taste or smell, unsure if he is running a fever or not  ?-Patient reports that he has been checking blood sugars regularly - averaging 80-120 - denies any issues with hypoglycemia  ?-Checking blood pressure on occasion, reports last he could recall was 160/90 ?-Scheduled to return to work 06/29/2020 (recent back surgery) ? ?Recommendations/Changes made from today's visit: ?-Recommending for patient to test for COVID, tried to schedule for televideo visit, but no appointments were available until 06/26/2020 - advised to go to urgent care for testing / prescription of antiviral if positive - DIL coming to take him in the afternoon - would recommend use of molnupiravir as he is taking eliquis  ?-Patient to start checking blood pressure at least every other day - to reach out should BP average be >140/90 ?-continue to monitor blood sugars, reach out with any issues of hypoglycemia  ? ?Subjective: ?Dale Anderson is an 88 y.o. year old male who is a primary patient of [name], MD.  The CCM team was consulted for assistance with disease management and care coordination needs.   ? ?Engaged with patient by telephone for follow up visit in response to provider referral for pharmacy case management and/or care coordination services.  ? ?Consent to Services:  ?The patient was given information about Chronic Care Management services, agreed to services, and gave verbal consent prior to initiation of services.  Please see initial visit note for detailed documentation.  ? ?Patient Care Team: ?[name], MD as PCP - General (Internal Medicine) ?Belva Crome, MD as PCP - Cardiology (Cardiology) ?Alda Berthold, DO as Consulting Physician (Neurology) ?Foltanski, Cleaster Corin, [facility] as Pharmacist (Pharmacist) ?Knox Royalty, RN as [place] Management ? ?Recent office visits: ?None since last visit  ? ?Recent consult visits: ?07/15/2021 - Dr. Louis Meckel - Urology - note not available  ?06/25/2021 - Dr. Brien Few - Physical Medicine and Rehab - note not available  ? ?Hospital visits: ?06/21/2021 - Urgent Care - Rx'd molnupiravir  ? ?Objective: ? ?Lab Results  ?Component Value Date  ? CREATININE 1.78 (H) 05/14/2021  ? BUN 21 05/14/2021  ? GFR 34.97 (L) 03/18/2021  ? GFRNONAA 36 (L) 05/14/2021  ? GFRAA 65 (L) 05/19/2012  ? NA 137 05/14/2021  ? K 5.0 05/14/2021  ? CALCIUM 9.0 05/14/2021  ? CO2 25 05/14/2021  ? ? ?Lab Results  ?Component Value Date/Time  ? HGBA1C 7.1 (H) 03/18/2021 04:11 PM  ? HGBA1C 7.9 (H) 10/17/2020 04:00 PM  ? HGBA1C 7.1 01/25/2016 12:00 AM  ? GFR 34.97 (L) 03/18/2021 04:11 PM  ? GFR 39.08 (L) 12/28/2020 12:08 PM  ? MICROALBUR 10.0 (H) 10/17/2020 04:00 PM  ? MICROALBUR 12.5 (H) 09/22/2019 10:34 AM  ?  ?Last diabetic Eye exam:  ?Lab Results  ?Component Value Date/Time  ? HMDIABEYEEXA No Retinopathy 12/10/2018 12:00 AM  ?  ?Last diabetic Foot exam:  ?Lab Results  ?Component Value Date/Time  ? HMDIABFOOTEX done 08/29/2013 12:00 AM  ?  ? ?Lab Results  ?Component Value Date  ? CHOL 127 03/18/2021  ? HDL 45.10 03/18/2021  ? LDLCALC 45 03/18/2021  ? TRIG 188.0 (H) 03/18/2021  ? CHOLHDL 3 03/18/2021  ? ? ? ?  Latest Ref Rng & Units 05/14/2021  ?  4:30 PM 12/28/2020  ? 12:08 PM 07/17/2020  ?  2:23 PM  ?Hepatic Function  ?Total Protein 6.5 - 8.1 g/dL 6.9   7.5   7.8    ?Albumin 3.5 - 5.0 g/dL 3.8   4.1   4.4    ?AST 15 - 41 U/L '25   19   18    ' ?ALT 0 - 44 U/L '25   16   19    ' ?Alk Phosphatase 38 - 126 U/L 77   63   71    ?Total Bilirubin 0.3 - 1.2 mg/dL 0.7   1.2   1.1    ?Bilirubin, Direct 0.0 - 0.3 mg/dL  0.2   0.2    ? ? ?Lab Results  ?Component Value Date/Time  ? TSH 2.846 05/14/2021 06:22 PM  ? TSH  2.32 07/17/2020 02:23 PM  ? TSH 4.28 01/04/2020 09:33 AM  ? ? ? ?  Latest Ref Rng & Units 05/14/2021  ?  4:30 PM 03/18/2021  ?  4:11 PM 12/28/2020  ? 12:08 PM  ?CBC  ?WBC 4.0 - 10.5 K/uL 6.3   7.4   6.7    ?Hemoglobin 13.0 - 17.0 g/dL 12.3   12.1   12.4    ?Hematocrit 39.0 - 52.0 % 37.2   35.3   36.5    ?Platelets 150 - 400 K/uL 206   208.0   195.0    ? ? ?No results found for: VD25OH ? ?Clinical ASCVD: No  ?The ASCVD Risk score (Arnett DK, et al., 2019) failed to calculate for the following reasons: ?  The 2019 ASCVD risk score is only valid for ages 32 to 21   ? ? ?  06/25/2021  ?  9:00 AM 02/07/2021  ?  9:15 AM 10/17/2020  ?  3:35 PM  ?Depression screen PHQ 2/9  ?Decreased Interest 0 0 0  ?Down, Depressed, Hopeless 0 0 0  ?PHQ - 2 Score 0 0 0  ?  ? ?  05/25/2018  ? 12:55 PM 11/24/2016  ? 11:08 AM  ?GAD 7 : Generalized Anxiety Score  ?Nervous, Anxious, on Edge 2 0  ?Control/stop worrying 0 0  ?Worry too much - different things 1 0  ?Trouble relaxing 1 0  ?Restless 1 0  ?Easily annoyed or irritable 0 0  ?Afraid - awful might happen 0 0  ?Total GAD 7 Score 5 0  ? ?CHA2DS2-VASc Score =    ?The patient's score is based upon: ?  ? ?   ? ?Social History  ? ?Tobacco Use  ?Smoking Status Never  ?Smokeless Tobacco Never  ? ?BP Readings from Last 3 Encounters:  ?06/21/21 (!) 143/76  ?06/10/21 (!) 143/73  ?05/27/21 (!) 156/104  ? ?Pulse Readings from Last 3 Encounters:  ?06/21/21 95  ?05/27/21 62  ?05/23/21 70  ? ?Wt Readings from Last 3 Encounters:  ?06/10/21 161 lb (73 kg)  ?05/23/21 160 lb 12.8 oz (72.9 kg)  ?05/21/21 161 lb (73 kg)  ? ?BMI Readings from Last 3 Encounters:  ?06/10/21 22.45 kg/m?  ?05/23/21 22.43 kg/m?  ?05/21/21 22.45 kg/m?  ? ? ? ?Assessment/Interventions: Review of patient past medical history, allergies, medications, health status, including review of consultants reports, laboratory and other test data, was performed as part of comprehensive evaluation and provision of chronic care management services.  ? ?SDOH:   (Social Determinants of Health) assessments and interventions performed: Yes ? ? ?[place] ? ?  Allergies  ?Allergen Reactions  ? Lisinopril Cough  ? ? ?Medications Reviewed Today   ? ? Reviewed by Knox Royalty, RN (Registered Nurse) on 08/19/21 at [place] List Status: <None>  ? ?Medication Order Taking? Sig Documenting Provider Last Dose Status Informant  ?ALPRAZolam (XANAX) 0.5 MG tablet [id number] No TAKE 1 TABLET BY MOUTH DAILY AS NEEDED FOR ANXIETY  ?Patient taking differently: Take 0.5 mg by mouth daily as needed for anxiety.  ? [name], MD Taking Active Self  ?apixaban (ELIQUIS) 2.5 MG TABS tablet [id number] No Take 1 tablet (2.5 mg total) by mouth 2 (two) times daily. Dawley, Theodoro Doing, DO Taking Active   ?atorvastatin (LIPITOR) 20 MG tablet [id number] No Take 1 tablet (20 mg total) by mouth daily. [name], MD Taking Active Self  ?blood glucose meter kit and supplies KIT [id number] No Use to test blood sugar once daily. DX: E11.9 [name], MD Taking Active Self  ?Blood Glucose Monitoring Suppl (FREESTYLE LITE) DEVI [id number] No  [provider] Taking Active Self  ?Cholecalciferol (VITAMIN D-3) 25 MCG (1000 UT) CAPS [id number] No Take 1,000 Units by mouth daily. [provider] 05/27/2021 Active Self  ?Continuous Blood Gluc Receiver (FREESTYLE LIBRE 2 READER) DEVI [id number] No USE AS DIRECTED [name], MD Taking Active Self  ?Continuous Blood Gluc Sensor (FREESTYLE LIBRE 2 SENSOR) MISC [id number] No USE AS DIRECTED  ?Patient taking differently: Inject 1 Device into the skin every 14 (fourteen) days.  ? [name], MD Taking Active Self  ?         ?Med Note Dessie Coma   Tue May 14, 2021  5:14 PM) The patient IS wearing a sensor at this time (left arm)  ?glucose blood (FREESTYLE LITE) test strip [id number] No USE TO CHECK BLOOD SUGAR ONCE DAILY [name], MD Taking Active Self  ?insulin glargine-yfgn (SEMGLEE, YFGN,) 100 UNIT/ML Pen [id number] No  Inject 10 units into the skin daily. [name], MD Taking Active Self  ?         ?Med Note Thressa Sheller, CARSON L   Mon May 27, 2021  2:30 PM) Patient states he took 10 units   ?Insulin Pen Needle (UNIFINE

## 2021-09-26 ENCOUNTER — Other Ambulatory Visit (HOSPITAL_COMMUNITY): Payer: Self-pay

## 2021-09-26 ENCOUNTER — Encounter: Payer: Self-pay | Admitting: Internal Medicine

## 2021-09-26 ENCOUNTER — Ambulatory Visit (INDEPENDENT_AMBULATORY_CARE_PROVIDER_SITE_OTHER): Payer: 59 | Admitting: Internal Medicine

## 2021-09-26 VITALS — BP 136/78 | HR 41 | Temp 97.8°F | Ht 71.0 in | Wt 158.0 lb

## 2021-09-26 DIAGNOSIS — Z23 Encounter for immunization: Secondary | ICD-10-CM

## 2021-09-26 DIAGNOSIS — I10 Essential (primary) hypertension: Secondary | ICD-10-CM | POA: Diagnosis not present

## 2021-09-26 DIAGNOSIS — N1832 Chronic kidney disease, stage 3b: Secondary | ICD-10-CM | POA: Diagnosis not present

## 2021-09-26 DIAGNOSIS — E118 Type 2 diabetes mellitus with unspecified complications: Secondary | ICD-10-CM | POA: Diagnosis not present

## 2021-09-26 LAB — BASIC METABOLIC PANEL
BUN: 28 mg/dL — ABNORMAL HIGH (ref 6–23)
CO2: 26 mEq/L (ref 19–32)
Calcium: 9.3 mg/dL (ref 8.4–10.5)
Chloride: 107 mEq/L (ref 96–112)
Creatinine, Ser: 1.56 mg/dL — ABNORMAL HIGH (ref 0.40–1.50)
GFR: 39.17 mL/min — ABNORMAL LOW (ref >60.00)
Glucose, Bld: 93 mg/dL (ref 70–99)
Potassium: 4 mEq/L (ref 3.5–5.1)
Sodium: 141 mEq/L (ref 135–145)

## 2021-09-26 LAB — CBC WITH DIFFERENTIAL/PLATELET
Basophils Absolute: 0 10*3/uL (ref 0.0–0.1)
Basophils Relative: 0.3 % (ref 0.0–3.0)
Eosinophils Absolute: 0.1 10*3/uL (ref 0.0–0.7)
Eosinophils Relative: 2.2 % (ref 0.0–5.0)
HCT: 35.2 % — ABNORMAL LOW (ref 39.0–52.0)
Hemoglobin: 11.9 g/dL — ABNORMAL LOW (ref 13.0–17.0)
Lymphocytes Relative: 24.9 % (ref 12.0–46.0)
Lymphs Abs: 1.3 10*3/uL (ref 0.7–4.0)
MCHC: 33.9 g/dL (ref 30.0–36.0)
MCV: 92.3 fl (ref 78.0–100.0)
Monocytes Absolute: 0.5 10*3/uL (ref 0.1–1.0)
Monocytes Relative: 9.1 % (ref 3.0–12.0)
Neutro Abs: 3.3 10*3/uL (ref 1.4–7.7)
Neutrophils Relative %: 63.5 % (ref 43.0–77.0)
Platelets: 199 10*3/uL (ref 150.0–400.0)
RBC: 3.81 Mil/uL — ABNORMAL LOW (ref 4.22–5.81)
RDW: 14.3 % (ref 11.5–15.5)
WBC: 5.2 10*3/uL (ref 4.0–10.5)

## 2021-09-26 LAB — HEMOGLOBIN A1C: Hgb A1c MFr Bld: 6.7 % — ABNORMAL HIGH (ref 4.6–6.5)

## 2021-09-26 MED ORDER — OZEMPIC (1 MG/DOSE) 4 MG/3ML ~~LOC~~ SOPN
1.0000 mg | PEN_INJECTOR | SUBCUTANEOUS | 1 refills | Status: DC
  Filled 2021-09-26: qty 9, 84d supply, fill #0
  Filled 2022-01-09: qty 9, 84d supply, fill #1

## 2021-09-26 NOTE — Patient Instructions (Signed)
Type 2 Diabetes Mellitus, Diagnosis, Adult ?Type 2 diabetes (type 2 diabetes mellitus) is a long-term, or chronic, disease. In type 2 diabetes, one or both of these problems may be present: ?The pancreas does not make enough of a hormone called insulin. ?Cells in the body do not respond properly to the insulin that the body makes (insulin resistance). ?Normally, insulin allows blood sugar (glucose) to enter cells in the body. The cells use glucose for energy. Insulin resistance or lack of insulin causes excess glucose to build up in the blood instead of going into cells. This causes high blood glucose (hyperglycemia).  ?What are the causes? ?The exact cause of type 2 diabetes is not known. ?What increases the risk? ?The following factors may make you more likely to develop this condition: ?Having a family member with type 2 diabetes. ?Being overweight or obese. ?Being inactive (sedentary). ?Having been diagnosed with insulin resistance. ?Having a history of prediabetes, diabetes when you were pregnant (gestational diabetes), or polycystic ovary syndrome (PCOS). ?What are the signs or symptoms? ?In the early stage of this condition, you may not have symptoms. Symptoms develop slowly and may include: ?Increased thirst or hunger. ?Increased urination. ?Unexplained weight loss. ?Tiredness (fatigue) or weakness. ?Vision changes, such as blurry vision. ?Dark patches on the skin. ?How is this diagnosed? ?This condition is diagnosed based on your symptoms, your medical history, a physical exam, and your blood glucose level. Your blood glucose may be checked with one or more of the following blood tests: ?A fasting blood glucose (FBG) test. You will not be allowed to eat (you will fast) for 8 hours or longer before a blood sample is taken. ?A random blood glucose test. This test checks blood glucose at any time of day regardless of when you ate. ?An A1C (hemoglobin A1C) blood test. This test provides information about blood  glucose levels over the previous 2-3 months. ?An oral glucose tolerance test (OGTT). This test measures your blood glucose at two times: ?After fasting. This is your baseline blood glucose level. ?Two hours after drinking a beverage that contains glucose. ?You may be diagnosed with type 2 diabetes if: ?Your fasting blood glucose level is 126 mg/dL (7.0 mmol/L) or higher. ?Your random blood glucose level is 200 mg/dL (11.1 mmol/L) or higher. ?Your A1C level is 6.5% or higher. ?Your oral glucose tolerance test result is higher than 200 mg/dL (11.1 mmol/L). ?These blood tests may be repeated to confirm your diagnosis. ?How is this treated? ?Your treatment may be managed by a specialist called an endocrinologist. Type 2 diabetes may be treated by following instructions from your health care provider about: ?Making dietary and lifestyle changes. These may include: ?Following a personalized nutrition plan that is developed by a registered dietitian. ?Exercising regularly. ?Finding ways to manage stress. ?Checking your blood glucose level as often as told. ?Taking diabetes medicines or insulin daily. This helps to keep your blood glucose levels in the healthy range. ?Taking medicines to help prevent complications from diabetes. Medicines may include: ?Aspirin. ?Medicine to lower cholesterol. ?Medicine to control blood pressure. ?Your health care provider will set treatment goals for you. Your goals will be based on your age, other medical conditions you have, and how you respond to diabetes treatment. Generally, the goal of treatment is to maintain the following blood glucose levels: ?Before meals: 80-130 mg/dL (4.4-7.2 mmol/L). ?After meals: below 180 mg/dL (10 mmol/L). ?A1C level: less than 7%. ?Follow these instructions at home: ?Questions to ask your health care provider ?  Consider asking the following questions: ?Should I meet with a certified diabetes care and education specialist? ?What diabetes medicines do I need,  and when should I take them? ?What equipment will I need to manage my diabetes at home? ?How often do I need to check my blood glucose? ?Where can I find a support group for people with diabetes? ?What number can I call if I have questions? ?When is my next appointment? ?General instructions ?Take over-the-counter and prescription medicines only as told by your health care provider. ?Keep all follow-up visits. This is important. ?Where to find more information ?For help and guidance and for more information about diabetes, please visit: ?American Diabetes Association (ADA): www.diabetes.org ?American Association of Diabetes Care and Education Specialists (ADCES): www.diabeteseducator.org ?International Diabetes Federation (IDF): www.idf.org ?Contact a health care provider if: ?Your blood glucose is at or above 240 mg/dL (13.3 mmol/L) for 2 days in a row. ?You have been sick or have had a fever for 2 days or longer, and you are not getting better. ?You have any of the following problems for more than 6 hours: ?You cannot eat or drink. ?You have nausea and vomiting. ?You have diarrhea. ?Get help right away if: ?You have severe hypoglycemia. This means your blood glucose is lower than 54 mg/dL (3.0 mmol/L). ?You become confused or you have trouble thinking clearly. ?You have difficulty breathing. ?You have moderate or large ketone levels in your urine. ?These symptoms may represent a serious problem that is an emergency. Do not wait to see if the symptoms will go away. Get medical help right away. Call your local emergency services (911 in the [place] Do not drive yourself to the hospital. ?Summary ?Type 2 diabetes mellitus is a long-term, or chronic, disease. In type 2 diabetes, the pancreas does not make enough of a hormone called insulin, or cells in the body do not respond properly to insulin that the body makes. ?This condition is treated by making dietary and lifestyle changes and taking diabetes medicines or  insulin. ?Your health care provider will set treatment goals for you. Your goals will be based on your age, other medical conditions you have, and how you respond to diabetes treatment. ?Keep all follow-up visits. This is important. ?This information is not intended to replace advice given to you by your health care provider. Make sure you discuss any questions you have with your health care provider. ?Document Revised: 09/03/2020 Document Reviewed: 09/03/2020 ?Elsevier Patient Education ? 2022 [employer]vier [employer] ? ?

## 2021-09-26 NOTE — Progress Notes (Signed)
? ?Subjective:  ?Patient ID: Dale Anderson, male    DOB: 03-11-1930  Age: 88 y.o. MRN: [id number] ? ?CC: Diabetes ? ? ?HPI ?Dale Anderson presents for f/up -  ? ?He is active and denies chest pain, shortness of breath, dizziness, lightheadedness, palpitations, near-syncope, edema, or polys. ? ?Outpatient Medications Prior to Visit  ?Medication Sig Dispense Refill  ? ALPRAZolam (XANAX) 0.5 MG tablet TAKE 1 TABLET BY MOUTH DAILY AS NEEDED FOR ANXIETY (Patient taking differently: Take 0.5 mg by mouth daily as needed for anxiety.) 30 tablet 3  ? apixaban (ELIQUIS) 2.5 MG TABS tablet Take 1 tablet (2.5 mg total) by mouth 2 (two) times daily. 180 tablet 1  ? atorvastatin (LIPITOR) 20 MG tablet Take 1 tablet (20 mg total) by mouth daily. 90 tablet 1  ? blood glucose meter kit and supplies KIT Use to test blood sugar once daily. DX: E11.9 1 each 0  ? Blood Glucose Monitoring Suppl (FREESTYLE LITE) DEVI   0  ? Cholecalciferol (VITAMIN D-3) 25 MCG (1000 UT) CAPS Take 1,000 Units by mouth daily.    ? Continuous Blood Gluc Sensor (FREESTYLE LIBRE 2 SENSOR) MISC USE AS DIRECTED (Patient taking differently: Inject 1 Device into the skin every 14 (fourteen) days.) 2 each 5  ? glucose blood (FREESTYLE LITE) test strip USE TO CHECK BLOOD SUGAR ONCE DAILY 100 strip 1  ? insulin glargine-yfgn (SEMGLEE, YFGN,) 100 UNIT/ML Pen Inject 10 units into the skin daily. 9 mL 3  ? Insulin Pen Needle (UNIFINE PENTIPS) 31G X 6 MM MISC USE TO INJECT INSULIN 5 TIMES DAILY 100 each 5  ? L-Methylfolate-Algae-B12-B6 3-90.314-2-35 MG CAPS Take 1 capsule by mouth 2 (two) times daily. 60 capsule 5  ? Lancets MISC Use to test blood sugar once daily. DX E11.09 100 each 3  ? metoprolol succinate (TOPROL-XL) 25 MG 24 hr tablet Take 1 tablet (25 mg total) by mouth daily. Needs office visit. 30 tablet 0  ? Multiple Vitamins-Minerals (CENTRUM SILVER PO) Take 1 tablet by mouth daily with breakfast.    ? pantoprazole (PROTONIX) 40 MG tablet Take 1  tablet (40 mg total) by mouth daily. 90 tablet 1  ? tamsulosin (FLOMAX) 0.4 MG CAPS capsule Take 1 capsule (0.4 mg total) by mouth daily. 90 capsule 3  ? Semaglutide,0.25 or 0.5MG/DOS, (OZEMPIC, 0.25 OR 0.5 MG/DOSE,) 2 MG/1.5ML SOPN Inject 0.5 mg into the skin once a week on Sundays (Patient taking differently: Inject 0.5 mg into the skin every Sunday.) 1.5 mL 2  ? ?No facility-administered medications prior to visit.  ? ? ?ROS ?Review of Systems  ?Constitutional:  Negative for diaphoresis, fatigue and unexpected weight change.  ?HENT: Negative.    ?Eyes: Negative.   ?Respiratory:  Negative for cough, chest tightness, shortness of breath and wheezing.   ?Cardiovascular:  Negative for chest pain, palpitations and leg swelling.  ?Gastrointestinal:  Negative for abdominal pain, diarrhea and nausea.  ?Endocrine: Negative.   ?Genitourinary: Negative.  Negative for difficulty urinating.  ?Musculoskeletal: Negative.  Negative for arthralgias and myalgias.  ?Skin: Negative.   ?Neurological:  Negative for dizziness, weakness and light-headedness.  ?Hematological:  Negative for adenopathy. Does not bruise/bleed easily.  ?Psychiatric/Behavioral:  Negative for sleep disturbance. The patient is nervous/anxious.   ? ?Objective:  ?BP 136/78 (BP Location: Right Arm, Patient Position: Sitting, Cuff Size: Normal)   Pulse (!) 41   Temp 97.8 ?F (36.6 ?C) (Oral)   Ht '5\' 11"'  (1.803 m)   Wt 158 lb (71.7  kg)   SpO2 92%   BMI 22.04 kg/m?  ? ?BP Readings from Last 3 Encounters:  ?09/26/21 136/78  ?06/21/21 (!) 143/76  ?06/10/21 (!) 143/73  ? ? ?Wt Readings from Last 3 Encounters:  ?09/26/21 158 lb (71.7 kg)  ?06/10/21 161 lb (73 kg)  ?05/23/21 160 lb 12.8 oz (72.9 kg)  ? ? ?Physical Exam ?Vitals reviewed.  ?HENT:  ?   Nose: Nose normal.  ?   Mouth/Throat:  ?   Mouth: Mucous membranes are moist.  ?Eyes:  ?   General: No scleral icterus. ?   Conjunctiva/sclera: Conjunctivae normal.  ?Cardiovascular:  ?   Rate and Rhythm: Regular rhythm.  Bradycardia present.  ?   Heart sounds: Murmur heard.  ?Systolic murmur is present with a grade of 1/6.  ?No diastolic murmur is present.  ?  No gallop.  ?Pulmonary:  ?   Effort: Pulmonary effort is normal.  ?   Breath sounds: No stridor. No wheezing, rhonchi or rales.  ?Abdominal:  ?   General: Abdomen is flat.  ?   Palpations: There is no mass.  ?   Tenderness: There is no abdominal tenderness. There is no guarding.  ?   Hernia: No hernia is present.  ?Musculoskeletal:     ?   General: No swelling.  ?   Cervical back: Neck supple.  ?   Right lower leg: No edema.  ?   Left lower leg: No edema.  ?Lymphadenopathy:  ?   Cervical: No cervical adenopathy.  ?Skin: ?   General: Skin is warm and dry.  ?Neurological:  ?   General: No focal deficit present.  ?   Mental Status: He is alert.  ?Psychiatric:     ?   Mood and Affect: Mood normal.     ?   Behavior: Behavior normal.  ? ? ?Lab Results  ?Component Value Date  ? WBC 5.2 09/26/2021  ? HGB 11.9 (L) 09/26/2021  ? HCT 35.2 (L) 09/26/2021  ? PLT 199.0 09/26/2021  ? GLUCOSE 93 09/26/2021  ? CHOL 127 03/18/2021  ? TRIG 188.0 (H) 03/18/2021  ? HDL 45.10 03/18/2021  ? LDLCALC 45 03/18/2021  ? ALT 25 05/14/2021  ? AST 25 05/14/2021  ? NA 141 09/26/2021  ? K 4.0 09/26/2021  ? CL 107 09/26/2021  ? CREATININE 1.56 (H) 09/26/2021  ? BUN 28 (H) 09/26/2021  ? CO2 26 09/26/2021  ? TSH 2.846 05/14/2021  ? PSA 2.55 08/21/2015  ? INR 1.0 05/14/2021  ? HGBA1C 6.7 (H) 09/26/2021  ? MICROALBUR 10.0 (H) 10/17/2020  ? ? ?No results found. ? ?Assessment & Plan:  ? ?Dale Anderson was seen today for diabetes. ? ?Diagnoses and all orders for this visit: ? ?Primary hypertension- His blood pressure is adequately well controlled. ?-     Basic metabolic panel; Future ?-     CBC with Differential/Platelet; Future ?-     CBC with Differential/Platelet ?-     Basic metabolic panel ? ?Type II diabetes mellitus with manifestations ([place])- His blood sugar is well controlled. ?-     Basic metabolic panel;  Future ?-     Hemoglobin A1c; Future ?-     Semaglutide, 1 MG/DOSE, (OZEMPIC, 1 MG/DOSE,) 4 MG/3ML SOPN; Inject 1 mg into the skin once a week. ?-     Hemoglobin A1c ?-     Basic metabolic panel ? ?Stage 3b chronic kidney disease ([place])- Will continue to maintain tight control of his blood pressure and  his blood sugar.  He is avoiding NSAIDs. ?-     Basic metabolic panel; Future ?-     CBC with Differential/Platelet; Future ?-     CBC with Differential/Platelet ?-     Basic metabolic panel ? ?Other orders ?-     Varicella-zoster vaccine IM (Shingrix) ? ? ?I have discontinued Dale Aloe. Banik's Ozempic (0.25 or 0.5 MG/DOSE). I am also having him start on Ozempic (1 MG/DOSE). Additionally, I am having him maintain his blood glucose meter kit and supplies, Lancets, FreeStyle Lite, FREESTYLE LITE, Multiple Vitamins-Minerals (CENTRUM SILVER PO), ALPRAZolam, insulin glargine-yfgn, pantoprazole, atorvastatin, FreeStyle Libre 2 Sensor, L-Methylfolate-Algae-B12-B6, metoprolol succinate, Vitamin D-3, Unifine Pentips, tamsulosin, and apixaban. ? ?Meds ordered this encounter  ?Medications  ? Semaglutide, 1 MG/DOSE, (OZEMPIC, 1 MG/DOSE,) 4 MG/3ML SOPN  ?  Sig: Inject 1 mg into the skin once a week.  ?  Dispense:  9 mL  ?  Refill:  1  ? ? ? ?Follow-up: Return in about 6 months (around 03/28/2022). ? ?Scarlette Calico, MD ?

## 2021-09-27 DIAGNOSIS — Z23 Encounter for immunization: Secondary | ICD-10-CM | POA: Insufficient documentation

## 2021-10-04 ENCOUNTER — Telehealth: Payer: Self-pay

## 2021-10-04 NOTE — Progress Notes (Signed)
? ? ?Chronic Care Management ?Pharmacy Assistant  ? ?Name: Dale Anderson  MRN: [id number] DOB: April 17, 1930 ? ? ?Reason for Encounter: Disease State ?  ?Conditions to be addressed/monitored: ?DMII ? ? ?Recent office visits:  ?09/26/21 [name], MD-PCP (Diabtes/htn) Blood work ordered, Med changes: Discontinued ozempic 0.25 mg-start ozempic 13m/dose ? ?Recent consult visits:  ?None ID ? ?Hospital visits:  ?None since the last coordination call ? ?Medications: ?Outpatient Encounter Medications as of 10/04/2021  ?Medication Sig Note  ? ALPRAZolam (XANAX) 0.5 MG tablet TAKE 1 TABLET BY MOUTH DAILY AS NEEDED FOR ANXIETY (Patient taking differently: Take 0.5 mg by mouth daily as needed for anxiety.)   ? apixaban (ELIQUIS) 2.5 MG TABS tablet Take 1 tablet (2.5 mg total) by mouth 2 (two) times daily.   ? atorvastatin (LIPITOR) 20 MG tablet Take 1 tablet (20 mg total) by mouth daily.   ? blood glucose meter kit and supplies KIT Use to test blood sugar once daily. DX: E11.9   ? Blood Glucose Monitoring Suppl (FREESTYLE LITE) DEVI    ? Cholecalciferol (VITAMIN D-3) 25 MCG (1000 UT) CAPS Take 1,000 Units by mouth daily.   ? Continuous Blood Gluc Sensor (FREESTYLE LIBRE 2 SENSOR) MISC USE AS DIRECTED (Patient taking differently: Inject 1 Device into the skin every 14 (fourteen) days.) 05/14/2021: The patient IS wearing a sensor at this time (left arm)  ? glucose blood (FREESTYLE LITE) test strip USE TO CHECK BLOOD SUGAR ONCE DAILY   ? insulin glargine-yfgn (SEMGLEE, YFGN,) 100 UNIT/ML Pen Inject 10 units into the skin daily. 05/27/2021: Patient states he took 10 units   ? Insulin Pen Needle (UNIFINE PENTIPS) 31G X 6 MM MISC USE TO INJECT INSULIN 5 TIMES DAILY   ? L-Methylfolate-Algae-B12-B6 3-90.314-2-35 MG CAPS Take 1 capsule by mouth 2 (two) times daily. 05/14/2021: (Metanx)  ? Lancets MISC Use to test blood sugar once daily. DX E11.09   ? metoprolol succinate (TOPROL-XL) 25 MG 24 hr tablet Take 1 tablet (25 mg total) by  mouth daily. Needs office visit.   ? Multiple Vitamins-Minerals (CENTRUM SILVER PO) Take 1 tablet by mouth daily with breakfast.   ? pantoprazole (PROTONIX) 40 MG tablet Take 1 tablet (40 mg total) by mouth daily.   ? Semaglutide, 1 MG/DOSE, (OZEMPIC, 1 MG/DOSE,) 4 MG/3ML SOPN Inject 1 mg into the skin once a week.   ? tamsulosin (FLOMAX) 0.4 MG CAPS capsule Take 1 capsule (0.4 mg total) by mouth daily.   ? [DISCONTINUED] telmisartan (MICARDIS) 40 MG tablet TAKE 1 TABLET BY MOUTH DAILY.   ? ?No facility-administered encounter medications on file as of 10/04/2021.  ? ?Recent Relevant Labs: ?Lab Results  ?Component Value Date/Time  ? HGBA1C 6.7 (H) 09/26/2021 01:53 PM  ? HGBA1C 7.1 (H) 03/18/2021 04:11 PM  ? HGBA1C 7.1 01/25/2016 12:00 AM  ? MICROALBUR 10.0 (H) 10/17/2020 04:00 PM  ? MICROALBUR 12.5 (H) 09/22/2019 10:34 AM  ?  ?Kidney Function ?Lab Results  ?Component Value Date/Time  ? CREATININE 1.56 (H) 09/26/2021 01:53 PM  ? CREATININE 1.78 (H) 05/14/2021 04:30 PM  ? CREATININE 1.56 (H) 01/04/2020 09:33 AM  ? GFR 39.17 (L) 09/26/2021 01:53 PM  ? GFRNONAA 36 (L) 05/14/2021 04:30 PM  ? GFRAA 65 (L) 05/19/2012 04:30 PM  ? ? ?Current antihyperglycemic regimen:  ?Semglee 10 units daily (free with insurance) ?Ozempic 1 mg daily on Sundays ?Freestyle Libre 2 ? ?What recent interventions/DTPs have been made to improve glycemic control:  ?discontinued Viktor G. Ask's  Ozempic (0.25 or 0.5 MG/DOSE). I am also having him start on Ozempic (1 MG/DOSE).  ? ?Have there been any recent hospitalizations or ED visits since last visit with CPP? No, not since last coordination call ? ?Patient denies hypoglycemic symptoms, including None ? ?Patient denies hyperglycemic symptoms, including none ? ?How often are you checking your blood sugar? Patient states that he wears a sensor on his arm that checks. Does not check every day ? ?What are your blood sugars ranging? Patient states that he thinks blood sugars range between  100-135 ?Fasting: 104 this morning ? ?During the week, how often does your blood glucose drop below 70? Never ? ?Are you checking your feet daily/regularly? Patient states that he works every day and is on his feet and does not have a problem with swelling or redness ? ?Adherence Review: ?Is the patient currently on a STATIN medication? Yes ?Is the patient currently on ACE/ARB medication? No ?Does the patient have >5 day gap between last estimated fill dates? Yes, patient states that he has a lot of medication bottle and he knows that he has not been taking some of them. Patient seems a little confused about some of the medications on his list. ? ?Care Gaps: ?Colonoscopy-NA ?Diabetic Foot Exam-12/28/20 ?Ophthalmology-12/10/18 ?Dexa Scan - NA ?Annual Well Visit - NA ?Micro albumin-10/17/20 ?Hemoglobin A1c-03/18/21 ?  ?Star Rating Drugs: ?Atorvastatin 20 mg-last fill 07/12/21 90 ds (Patient states that he does not think he takes this medication) ? ? Ethelene Hal ?Clinical Pharmacist Assistant ?[phone]  ?

## 2021-10-09 ENCOUNTER — Telehealth: Payer: 59

## 2021-10-09 ENCOUNTER — Telehealth: Payer: Self-pay | Admitting: *Deleted

## 2021-10-09 NOTE — Chronic Care Management (AMB) (Signed)
?  Care Management  ? ?Note ? ?10/09/2021 ?Name: Dale Anderson MRN: [id number] DOB: 1929-11-24 ? ?[name] is a 88 y.o. year old male who is a primary care patient of [name], MD and is actively engaged with the care management team. I reached out to [name] by phone today to assist with re-scheduling a follow up visit with the RN Case Manager ? ?Follow up plan: ?Unsuccessful telephone outreach attempt made. The care management team will reach out to the patient again over the next 7 days. If patient returns call to provider office, please advise to call [place] at [phone]. ? ?[name]  ?Care Guide, Embedded Care Coordination ?[place]  Care Management  ?Direct Dial: [phone] ? ?

## 2021-10-10 ENCOUNTER — Other Ambulatory Visit: Payer: Self-pay | Admitting: Internal Medicine

## 2021-10-10 ENCOUNTER — Other Ambulatory Visit: Payer: Self-pay | Admitting: Interventional Cardiology

## 2021-10-10 ENCOUNTER — Other Ambulatory Visit (HOSPITAL_COMMUNITY): Payer: Self-pay

## 2021-10-10 DIAGNOSIS — K219 Gastro-esophageal reflux disease without esophagitis: Secondary | ICD-10-CM

## 2021-10-10 DIAGNOSIS — E785 Hyperlipidemia, unspecified: Secondary | ICD-10-CM

## 2021-10-10 MED ORDER — ATORVASTATIN CALCIUM 20 MG PO TABS
20.0000 mg | ORAL_TABLET | Freq: Every day | ORAL | 1 refills | Status: DC
  Filled 2021-10-10: qty 90, 90d supply, fill #0

## 2021-10-10 MED ORDER — PANTOPRAZOLE SODIUM 40 MG PO TBEC
40.0000 mg | DELAYED_RELEASE_TABLET | Freq: Every day | ORAL | 1 refills | Status: DC
  Filled 2021-10-10: qty 90, 90d supply, fill #0
  Filled 2022-02-19: qty 90, 90d supply, fill #1

## 2021-10-10 MED ORDER — METOPROLOL SUCCINATE ER 25 MG PO TB24
25.0000 mg | ORAL_TABLET | Freq: Every day | ORAL | 2 refills | Status: DC
Start: 2021-10-10 — End: 2023-01-27
  Filled 2021-10-10: qty 90, 90d supply, fill #0
  Filled 2022-04-24: qty 90, 90d supply, fill #1

## 2021-10-11 ENCOUNTER — Emergency Department (HOSPITAL_BASED_OUTPATIENT_CLINIC_OR_DEPARTMENT_OTHER): Payer: 59

## 2021-10-11 ENCOUNTER — Encounter (HOSPITAL_BASED_OUTPATIENT_CLINIC_OR_DEPARTMENT_OTHER): Payer: Self-pay | Admitting: Pediatrics

## 2021-10-11 ENCOUNTER — Other Ambulatory Visit: Payer: Self-pay

## 2021-10-11 ENCOUNTER — Emergency Department (HOSPITAL_BASED_OUTPATIENT_CLINIC_OR_DEPARTMENT_OTHER)
Admission: EM | Admit: 2021-10-11 | Discharge: 2021-10-11 | Disposition: A | Discharge status: Home / Self Care | Payer: 59 | Attending: Emergency Medicine | Admitting: Emergency Medicine

## 2021-10-11 DIAGNOSIS — I4891 Unspecified atrial fibrillation: Secondary | ICD-10-CM | POA: Insufficient documentation

## 2021-10-11 DIAGNOSIS — Z7901 Long term (current) use of anticoagulants: Secondary | ICD-10-CM | POA: Diagnosis not present

## 2021-10-11 DIAGNOSIS — I10 Essential (primary) hypertension: Secondary | ICD-10-CM | POA: Insufficient documentation

## 2021-10-11 DIAGNOSIS — E878 Other disorders of electrolyte and fluid balance, not elsewhere classified: Secondary | ICD-10-CM | POA: Diagnosis not present

## 2021-10-11 DIAGNOSIS — E119 Type 2 diabetes mellitus without complications: Secondary | ICD-10-CM | POA: Diagnosis not present

## 2021-10-11 DIAGNOSIS — Z794 Long term (current) use of insulin: Secondary | ICD-10-CM | POA: Insufficient documentation

## 2021-10-11 DIAGNOSIS — R42 Dizziness and giddiness: Secondary | ICD-10-CM

## 2021-10-11 DIAGNOSIS — Z79899 Other long term (current) drug therapy: Secondary | ICD-10-CM | POA: Insufficient documentation

## 2021-10-11 DIAGNOSIS — R29818 Other symptoms and signs involving the nervous system: Secondary | ICD-10-CM | POA: Diagnosis not present

## 2021-10-11 LAB — CBC
HCT: 36.7 % — ABNORMAL LOW (ref 39.0–52.0)
Hemoglobin: 12.1 g/dL — ABNORMAL LOW (ref 13.0–17.0)
MCH: 30.7 pg (ref 26.0–34.0)
MCHC: 33 g/dL (ref 30.0–36.0)
MCV: 93.1 fL (ref 80.0–100.0)
Platelets: 206 10*3/uL (ref 150–400)
RBC: 3.94 MIL/uL — ABNORMAL LOW (ref 4.22–5.81)
RDW: 13.2 % (ref 11.5–15.5)
WBC: 9.7 10*3/uL (ref 4.0–10.5)
nRBC: 0 % (ref 0.0–0.2)

## 2021-10-11 LAB — URINALYSIS, ROUTINE W REFLEX MICROSCOPIC
Bilirubin Urine: NEGATIVE
Glucose, UA: NEGATIVE mg/dL
Hgb urine dipstick: NEGATIVE
Ketones, ur: NEGATIVE mg/dL
Leukocytes,Ua: NEGATIVE
Nitrite: NEGATIVE
Protein, ur: 30 mg/dL — AB
Specific Gravity, Urine: 1.023 (ref 1.005–1.030)
pH: 5 (ref 5.0–8.0)

## 2021-10-11 LAB — BASIC METABOLIC PANEL
Anion gap: 8 (ref 5–15)
BUN: 28 mg/dL — ABNORMAL HIGH (ref 8–23)
CO2: 25 mmol/L (ref 22–32)
Calcium: 9.4 mg/dL (ref 8.9–10.3)
Chloride: 105 mmol/L (ref 98–111)
Creatinine, Ser: 1.67 mg/dL — ABNORMAL HIGH (ref 0.61–1.24)
GFR, Estimated: 39 mL/min — ABNORMAL LOW (ref >60)
Glucose, Bld: 105 mg/dL — ABNORMAL HIGH (ref 70–99)
Potassium: 4.1 mmol/L (ref 3.5–5.1)
Sodium: 138 mmol/L (ref 135–145)

## 2021-10-11 LAB — TROPONIN I (HIGH SENSITIVITY)
Troponin I (High Sensitivity): 6 ng/L (ref <18)
Troponin I (High Sensitivity): 6 ng/L (ref <18)

## 2021-10-11 LAB — HEPATIC FUNCTION PANEL
ALT: 14 U/L (ref 0–44)
AST: 17 U/L (ref 15–41)
Albumin: 4.2 g/dL (ref 3.5–5.0)
Alkaline Phosphatase: 55 U/L (ref 38–126)
Bilirubin, Direct: 0.2 mg/dL (ref 0.0–0.2)
Indirect Bilirubin: 0.6 mg/dL (ref 0.3–0.9)
Total Bilirubin: 0.8 mg/dL (ref 0.3–1.2)
Total Protein: 7.5 g/dL (ref 6.5–8.1)

## 2021-10-11 LAB — CBG MONITORING, ED: Glucose-Capillary: 134 mg/dL — ABNORMAL HIGH (ref 70–99)

## 2021-10-11 NOTE — ED Triage Notes (Signed)
C/o feeling dizzy for about a week now; denies fall or any passing out; ?

## 2021-10-11 NOTE — ED Notes (Signed)
Patient to MRI.

## 2021-10-11 NOTE — ED Provider Notes (Signed)
?[facility] ?Provider Note ? ? ?CSN: [id number] ?Arrival date & time: 10/11/21  1535 ? ?  ? ?History ? ?Chief Complaint  ?Patient presents with  ? Dizziness  ? ? ?Dale Anderson is a 88 y.o. male. ? ?HPI ? ?  ?88 year old male with a history of diabetes, hypertension, hyperlipidemia, recurrent bronchiectasis, atrial fibrillation on Eliquis, who presents with concern for disequilibrium. ? ?Reports "I have been wobbly for one week" and that his wife recommended he come for evaluation. ?Denies falls, syncope, lightheadedness.  Denies it being a significant room spinning sensation.  Reports that he feels just off balance as he walks around, and that it is mild, but new since a week ago.  Denies other symptoms including no chest pain, shortness of breath, abdominal pain, nausea, vomiting, diarrhea, black or bloody stools.  No medication changes.  Reports he coughed about 4 days ago and felt like his hernia went out to his good and believes that it might still be out.  Denies any associated pain or other symptoms. ? ? ?Past Medical History:  ?Diagnosis Date  ? Adhesive capsulitis of right shoulder   ? Anxiety   ? Diabetes mellitus ([place])   ? DJD (degenerative joint disease)   ? Dysrhythmia   ? Atrial fib  ? GERD (gastroesophageal reflux disease)   ? Hemorrhoid   ? History of shingles   ? Hyperlipidemia   ? Hypertension   ? Nodular prostate without urinary obstruction   ? Recurrent bronchiectasis ([place])   ? Tear of right rotator cuff   ?  ?Home Medications ?Prior to Admission medications   ?Medication Sig Start Date End Date Taking? Authorizing Provider  ?ALPRAZolam (XANAX) 0.5 MG tablet TAKE 1 TABLET BY MOUTH DAILY AS NEEDED FOR ANXIETY ?Patient taking differently: Take 0.5 mg by mouth daily as needed for anxiety. 01/23/20   [name], MD  ?apixaban (ELIQUIS) 2.5 MG TABS tablet Take 1 tablet (2.5 mg total) by mouth 2 (two) times daily. 09/10/21   [name], MD  ?atorvastatin  (LIPITOR) 20 MG tablet Take 1 tablet (20 mg total) by mouth daily. 10/10/21   [name], MD  ?blood glucose meter kit and supplies KIT Use to test blood sugar once daily. DX: E11.9 07/24/16   [name], MD  ?Blood Glucose Monitoring Suppl (FREESTYLE LITE) DEVI  07/24/16   [provider]  ?Cholecalciferol (VITAMIN D-3) 25 MCG (1000 UT) CAPS Take 1,000 Units by mouth daily.    [provider]  ?Continuous Blood Gluc Sensor (FREESTYLE LIBRE 2 SENSOR) MISC USE AS DIRECTED ?Patient taking differently: Inject 1 Device into the skin every 14 (fourteen) days. 02/20/21 02/20/22  [name], MD  ?glucose blood (FREESTYLE LITE) test strip USE TO CHECK BLOOD SUGAR ONCE DAILY 02/08/19   [name], MD  ?insulin glargine-yfgn (SEMGLEE, YFGN,) 100 UNIT/ML Pen Inject 10 units into the skin daily. 01/09/21   [name], MD  ?Insulin Pen Needle (UNIFINE PENTIPS) 31G X 6 MM MISC USE TO INJECT INSULIN 5 TIMES DAILY 05/27/21 05/27/22  [name], MD  ?L-Methylfolate-Algae-B12-B6 3-90.314-2-35 MG CAPS Take 1 capsule by mouth 2 (two) times daily. 03/08/21   [name], MD  ?Lancets MISC Use to test blood sugar once daily. DX E11.09 07/24/16   [name], MD  ?metoprolol succinate (TOPROL-XL) 25 MG 24 hr tablet Take 1 tablet (25 mg total) by mouth daily. 10/10/21   Belva Crome, MD  ?  Multiple Vitamins-Minerals (CENTRUM SILVER PO) Take 1 tablet by mouth daily with breakfast.    [provider]  ?pantoprazole (PROTONIX) 40 MG tablet Take 1 tablet (40 mg total) by mouth daily. 10/10/21   [name], MD  ?Semaglutide, 1 MG/DOSE, (OZEMPIC, 1 MG/DOSE,) 4 MG/3ML SOPN Inject 1 mg into the skin once a week. 09/26/21   [name], MD  ?tamsulosin (FLOMAX) 0.4 MG CAPS capsule Take 1 capsule (0.4 mg total) by mouth daily. 07/15/21     ?telmisartan (MICARDIS) 40 MG tablet TAKE 1 TABLET BY MOUTH DAILY. 04/04/20 07/17/20  [name], MD  ?   ? ?Allergies    ?Lisinopril   ? ?Review of  Systems   ?Review of Systems ? ?Physical Exam ?Updated Vital Signs ?BP (!) 151/90   Pulse 83   Temp 99.1 ?F (37.3 ?C) (Oral)   Resp 18   Ht '5\' 11"'  (1.803 m)   Wt 71.7 kg   SpO2 100%   BMI 22.04 kg/m?  ?Physical Exam ?Vitals and nursing note reviewed.  ?Constitutional:   ?   General: He is not in acute distress. ?   Appearance: Normal appearance. He is well-developed. He is not ill-appearing or diaphoretic.  ?HENT:  ?   Head: Normocephalic and atraumatic.  ?Eyes:  ?   General: No visual field deficit. ?   Extraocular Movements: Extraocular movements intact.  ?   Conjunctiva/sclera: Conjunctivae normal.  ?   Pupils: Pupils are equal, round, and reactive to light.  ?Cardiovascular:  ?   Rate and Rhythm: Normal rate and regular rhythm.  ?   Pulses: Normal pulses.  ?   Heart sounds: Normal heart sounds. No murmur heard. ?  No friction rub. No gallop.  ?Pulmonary:  ?   Effort: Pulmonary effort is normal. No respiratory distress.  ?   Breath sounds: Normal breath sounds. No wheezing or rales.  ?Abdominal:  ?   General: There is no distension.  ?   Palpations: Abdomen is soft.  ?   Tenderness: There is no abdominal tenderness. There is no guarding.  ?   Hernia: There is no hernia in the left inguinal area or right inguinal area.  ?Genitourinary: ?   Testes: Normal.     ?   Right: Tenderness not present.     ?   Left: Tenderness not present.  ?Musculoskeletal:     ?   General: No swelling or tenderness.  ?   Cervical back: Normal range of motion.  ?Skin: ?   General: Skin is warm and dry.  ?   Findings: No erythema or rash.  ?Neurological:  ?   General: No focal deficit present.  ?   Mental Status: He is alert and oriented to person, place, and time.  ?   GCS: GCS eye subscore is 4. GCS verbal subscore is 5. GCS motor subscore is 6.  ?   Cranial Nerves: No cranial nerve deficit, dysarthria or facial asymmetry.  ?   Sensory: No sensory deficit.  ?   Motor: No weakness or tremor.  ?   Coordination: Coordination normal.  Finger-Nose-Finger Test normal.  ? ? ?ED Results / Procedures / Treatments   ?Labs ?(all labs ordered are listed, but only abnormal results are displayed) ?Labs Reviewed  ?BASIC METABOLIC PANEL - Abnormal; Notable for the following components:  ?    Result Value  ? Glucose, Bld 105 (*)   ? BUN 28 (*)   ? Creatinine, Ser  1.67 (*)   ? GFR, Estimated 39 (*)   ? All other components within normal limits  ?CBC - Abnormal; Notable for the following components:  ? RBC 3.94 (*)   ? Hemoglobin 12.1 (*)   ? HCT 36.7 (*)   ? All other components within normal limits  ?URINALYSIS, ROUTINE W REFLEX MICROSCOPIC - Abnormal; Notable for the following components:  ? Protein, ur 30 (*)   ? All other components within normal limits  ?CBG MONITORING, ED - Abnormal; Notable for the following components:  ? Glucose-Capillary 134 (*)   ? All other components within normal limits  ?HEPATIC FUNCTION PANEL  ?TROPONIN I (HIGH SENSITIVITY)  ?TROPONIN I (HIGH SENSITIVITY)  ? ? ?EKG ?EKG Interpretation ? ?Date/Time:  Friday October 11 2021 15:45:19 EDT ?Ventricular Rate:  87 ?PR Interval:  178 ?QRS Duration: 90 ?QT Interval:  362 ?QTC Calculation: 435 ?R Axis:   -62 ?Text Interpretation: Normal sinus rhythm Left anterior fascicular block Left ventricular hypertrophy ( R in aVL , Cornell product , Romhilt-Estes ) Abnormal ECG When compared with ECG of 14-May-2021 16:06, QRS duration has decreased Nonspecific T wave abnormality, improved in Lateral leads Confirmed by Gareth Morgan [phone]) on 10/11/2021 4:44:16 PM ? ?Radiology ?MR BRAIN WO CONTRAST ? ?Result Date: 10/11/2021 ?CLINICAL DATA:  Neuro deficit, acute, stroke suspected dizziness EXAM: MRI HEAD WITHOUT CONTRAST TECHNIQUE: Multiplanar, multiecho pulse sequences of the brain and surrounding structures were obtained without intravenous contrast. COMPARISON:  MRI head May 14, 2021. FINDINGS: Brain: No acute infarction, hemorrhage, hydrocephalus, extra-axial collection or mass lesion.  Similar chronic lacunar infarct versus dilated perivascular space in the left frontal white matter. Mild scattered T2/FLAIR hyperintensities in the white matter, nonspecific but compatible with chronic microvascular ischemi

## 2021-10-17 NOTE — Chronic Care Management (AMB) (Signed)
?  Care Management  ? ?Note ? ?10/17/2021 ?Name: Dale Anderson MRN: [id number] DOB: 18-May-1930 ? ?Dale Anderson is a 88 y.o. year old male who is a primary care patient of [name], MD and is actively engaged with the care management team. I reached out to [name] by phone today to assist with re-scheduling a follow up visit with the RN Case Manager ? ?Follow up plan: ?Unsuccessful telephone outreach attempt made. A HIPAA compliant phone message was left for the patient providing contact information and requesting a return call.  ?The care management team will reach out to the patient again over the next 7 days.  ?If patient returns call to provider office, please advise to call [facility]  at [phone]. ? ?[name]  ?Care Guide, Embedded Care Coordination ?[facility]  Care Management  ?Direct Dial: [phone] ? ?

## 2021-10-21 ENCOUNTER — Telehealth: Payer: Self-pay | Admitting: Internal Medicine

## 2021-10-21 DIAGNOSIS — H52203 Unspecified astigmatism, bilateral: Secondary | ICD-10-CM | POA: Diagnosis not present

## 2021-10-21 DIAGNOSIS — H5203 Hypermetropia, bilateral: Secondary | ICD-10-CM | POA: Diagnosis not present

## 2021-10-21 LAB — HM DIABETES EYE EXAM

## 2021-10-21 NOTE — Telephone Encounter (Signed)
FYI - Patient just wanted Dr. Ronnald Ramp to know that he went to [facility] a couple of weeks ago - patient states everything checked out fine, but he wanted you to know. ?

## 2021-10-24 NOTE — Chronic Care Management (AMB) (Signed)
?  Chronic Care Management ?Note ? ?10/24/2021 ?Name: [name] MRN: [id number] DOB: 1930-02-22 ? ?[name] is a 88 y.o. year old male who is a primary care patient of [name], MD and is actively engaged with the care management team. I reached out to [name] by phone today to assist with re-scheduling a follow up visit with the RN Case Manager ? ?Follow up plan: ?A third unsuccessful telephone outreach attempt made. Unable to make contact on outreach attempts x 3. PCP Dr Ronnald Ramp  notified via routed documentation in medical record. We have been unable to make contact with the patient for follow up. The care management team is available to follow up with the patient after provider conversation with the patient regarding recommendation for care management engagement and subsequent re-referral to the care management team.  ? ?[name]  ?Care Guide, Embedded Care Coordination ?[facility]  Care Management  ?Direct Dial: [phone] ? ?

## 2021-10-28 ENCOUNTER — Ambulatory Visit (INDEPENDENT_AMBULATORY_CARE_PROVIDER_SITE_OTHER): Payer: 59 | Admitting: *Deleted

## 2021-10-28 ENCOUNTER — Encounter: Payer: Self-pay | Admitting: *Deleted

## 2021-10-28 DIAGNOSIS — E118 Type 2 diabetes mellitus with unspecified complications: Secondary | ICD-10-CM

## 2021-10-28 DIAGNOSIS — I10 Essential (primary) hypertension: Secondary | ICD-10-CM

## 2021-10-28 NOTE — Chronic Care Management (AMB) (Signed)
?Chronic Care Management  ? ?CCM RN Visit Note ? ?10/28/2021 ?Name: Dale Anderson MRN: [id number] DOB: August 16, 1929 ? ?Subjective: ?Dale Anderson is a 88 y.o. year old male who is a primary care patient of [name], MD. The care management team was consulted for assistance with disease management and care coordination needs.   ? ?Collaboration with CCM scheduling care guide  for  case closure  in response to provider referral for case management and/or care coordination services.  ? ?Consent to Services:  ?The patient was given information about Chronic Care Management services, agreed to services, and gave verbal consent prior to initiation of services.  Please see initial visit note for detailed documentation.  ?Patient agreed to services and verbal consent obtained.  ? ?Assessment: Review of patient past medical history, allergies, medications, health status, including review of consultants reports, laboratory and other test data, was performed as part of comprehensive evaluation and provision of chronic care management services.  ?CCM Care Plan ? ?Allergies  ?Allergen Reactions  ? Lisinopril Cough  ? ?Outpatient Encounter Medications as of 10/28/2021  ?Medication Sig Note  ? ALPRAZolam (XANAX) 0.5 MG tablet TAKE 1 TABLET BY MOUTH DAILY AS NEEDED FOR ANXIETY (Patient taking differently: Take 0.5 mg by mouth daily as needed for anxiety.)   ? apixaban (ELIQUIS) 2.5 MG TABS tablet Take 1 tablet (2.5 mg total) by mouth 2 (two) times daily.   ? atorvastatin (LIPITOR) 20 MG tablet Take 1 tablet (20 mg total) by mouth daily.   ? blood glucose meter kit and supplies KIT Use to test blood sugar once daily. DX: E11.9   ? Blood Glucose Monitoring Suppl (FREESTYLE LITE) DEVI    ? Cholecalciferol (VITAMIN D-3) 25 MCG (1000 UT) CAPS Take 1,000 Units by mouth daily.   ? Continuous Blood Gluc Sensor (FREESTYLE LIBRE 2 SENSOR) MISC USE AS DIRECTED (Patient taking differently: Inject 1 Device into the skin every 14  (fourteen) days.) 05/14/2021: The patient IS wearing a sensor at this time (left arm)  ? glucose blood (FREESTYLE LITE) test strip USE TO CHECK BLOOD SUGAR ONCE DAILY   ? insulin glargine-yfgn (SEMGLEE, YFGN,) 100 UNIT/ML Pen Inject 10 units into the skin daily. 05/27/2021: Patient states he took 10 units   ? Insulin Pen Needle (UNIFINE PENTIPS) 31G X 6 MM MISC USE TO INJECT INSULIN 5 TIMES DAILY   ? L-Methylfolate-Algae-B12-B6 3-90.314-2-35 MG CAPS Take 1 capsule by mouth 2 (two) times daily. 05/14/2021: (Metanx)  ? Lancets MISC Use to test blood sugar once daily. DX E11.09   ? metoprolol succinate (TOPROL-XL) 25 MG 24 hr tablet Take 1 tablet (25 mg total) by mouth daily.   ? Multiple Vitamins-Minerals (CENTRUM SILVER PO) Take 1 tablet by mouth daily with breakfast.   ? pantoprazole (PROTONIX) 40 MG tablet Take 1 tablet (40 mg total) by mouth daily.   ? Semaglutide, 1 MG/DOSE, (OZEMPIC, 1 MG/DOSE,) 4 MG/3ML SOPN Inject 1 mg into the skin once a week.   ? tamsulosin (FLOMAX) 0.4 MG CAPS capsule Take 1 capsule (0.4 mg total) by mouth daily.   ? [DISCONTINUED] telmisartan (MICARDIS) 40 MG tablet TAKE 1 TABLET BY MOUTH DAILY.   ? ?No facility-administered encounter medications on file as of 10/28/2021.  ? ?Patient Active Problem List  ? Diagnosis Date Noted  ? Need for shingles vaccine 09/27/2021  ? Rotator cuff insufficiency of left shoulder 12/28/2020  ? Encounter for general adult medical examination with abnormal findings 07/18/2020  ? Atrial fibrillation  by electrocardiogram ([place]) 01/12/2020  ? Murmur, cardiac 01/04/2020  ? Bradycardia 01/04/2020  ? Internal hemorrhoid 12/08/2019  ? Tinea cruris 12/05/2019  ? Seborrheic dermatitis of scalp 05/05/2019  ? Diabetic polyneuropathy associated with type 2 diabetes mellitus ([place]) 03/29/2019  ? Seasonal allergic rhinitis due to pollen 05/24/2018  ? Sciatica 10/21/2017  ? Chronic renal disease, stage 3, moderately decreased glomerular filtration rate (GFR) between 30-59  mL/min/1.73 square meter ([facility]) 07/02/2017  ? Routine general medical examination at a health care facility 12/26/2013  ? DDD (degenerative disc disease), cervical 12/26/2013  ? Hypertension   ? Hyperlipidemia with target LDL less than 100   ? Type II diabetes mellitus with manifestations ([place])   ? GERD (gastroesophageal reflux disease)   ? DJD (degenerative joint disease)   ? Anxiety   ? ?Conditions to be addressed/monitored:  HTN and DMII ? ?Care Plan : RN Care Manager Plan of Care  ?Updates made by Knox Royalty, RN since 10/28/2021 12:00 AM  ?  ? ?Problem: Chronic Disease Management Needs   ?Priority: Medium  ?  ? ?Long-Range Goal: Ongoing adherence to established plan of care for long term chronic disease management   ?Start Date: 11/30/2020  ?Expected End Date: 11/30/2021  ?Priority: Medium  ?Note:   ?Current Barriers:  ?Chronic Disease Management support and education needs related to HTN and DMII ? ?RNCM Clinical Goal(s):  ?Patient will demonstrate ongoing health management independence HTN; DMII  through collaboration with RN Care manager, provider, and care team.  ? ?Interventions: ?1:1 collaboration with primary care provider regarding development and update of comprehensive plan of care as evidenced by provider attestation and co-signature ?Inter-disciplinary care team collaboration (see longitudinal plan of care) ?Evaluation of current treatment plan related to  self management and patient's adherence to plan as established by provider ? ?10/28/21: CCM RN CM Case Closure- unable to maintain contact: ?Received notification from scheduling care guide 10/24/21 that she had made 3 consecutive unsuccessful outreaches to patient to re-schedule previously missed RN CM telephone follow up visit; case closure accordingly with completion of care plan; PCP made aware of CCM RN CM case closure ? ? ? ? ? ? ? ?  ? ?Plan: ?We have been unable to make contact with the patient for follow up. The care management team is  available to follow up with the patient after provider conversation with the patient regarding recommendation for care management engagement and subsequent re-referral to the care management team ?No further follow up required: case closure- unable to maintain contact with patient ? ?[name], RN, BSN, CCRN Alumnus ?[place] ?([phone]: direct office ? ? ? ? ? ? ? ? ?

## 2021-10-30 ENCOUNTER — Other Ambulatory Visit: Payer: Self-pay | Admitting: Internal Medicine

## 2021-10-30 ENCOUNTER — Other Ambulatory Visit (HOSPITAL_COMMUNITY): Payer: Self-pay

## 2021-10-30 DIAGNOSIS — E119 Type 2 diabetes mellitus without complications: Secondary | ICD-10-CM

## 2021-10-30 DIAGNOSIS — E118 Type 2 diabetes mellitus with unspecified complications: Secondary | ICD-10-CM

## 2021-10-30 MED ORDER — FREESTYLE LIBRE 2 READER DEVI
5 refills | Status: DC
  Filled 2021-10-30: qty 1, 30d supply, fill #0
  Filled 2021-11-14 – 2022-04-25 (×3): qty 1, 30d supply, fill #1
  Filled 2022-05-01: qty 1, 30d supply, fill #2

## 2021-11-04 ENCOUNTER — Telehealth: Payer: Self-pay

## 2021-11-04 NOTE — Progress Notes (Signed)
Chronic Care Management Pharmacy Assistant   Name: Dale Anderson  MRN: [id number] DOB: 02-21-1930    Reason for Encounter: Disease State-General    Recent office visits:  None since the last coordination call 10/04/21  Recent consult visits:  None since the last coordination call 10/04/21  Hospital visits:  None since the last coordination call 10/04/21  Medications: Outpatient Encounter Medications as of 11/04/2021  Medication Sig Note   ALPRAZolam (XANAX) 0.5 MG tablet TAKE 1 TABLET BY MOUTH DAILY AS NEEDED FOR ANXIETY (Patient taking differently: Take 0.5 mg by mouth daily as needed for anxiety.)    apixaban (ELIQUIS) 2.5 MG TABS tablet Take 1 tablet (2.5 mg total) by mouth 2 (two) times daily.    atorvastatin (LIPITOR) 20 MG tablet Take 1 tablet (20 mg total) by mouth daily.    blood glucose meter kit and supplies KIT Use to test blood sugar once daily. DX: E11.9    Blood Glucose Monitoring Suppl (FREESTYLE LITE) DEVI     Cholecalciferol (VITAMIN D-3) 25 MCG (1000 UT) CAPS Take 1,000 Units by mouth daily.    Continuous Blood Gluc Receiver (FREESTYLE LIBRE 2 READER) DEVI USE AS DIRECTED    Continuous Blood Gluc Sensor (FREESTYLE LIBRE 2 SENSOR) MISC USE AS DIRECTED (Patient taking differently: Inject 1 Device into the skin every 14 (fourteen) days.) 05/14/2021: The patient IS wearing a sensor at this time (left arm)   glucose blood (FREESTYLE LITE) test strip USE TO CHECK BLOOD SUGAR ONCE DAILY    insulin glargine-yfgn (SEMGLEE, YFGN,) 100 UNIT/ML Pen Inject 10 units into the skin daily. 05/27/2021: Patient states he took 10 units    Insulin Pen Needle (UNIFINE PENTIPS) 31G X 6 MM MISC USE TO INJECT INSULIN 5 TIMES DAILY    L-Methylfolate-Algae-B12-B6 3-90.314-2-35 MG CAPS Take 1 capsule by mouth 2 (two) times daily. 05/14/2021: (Metanx)   Lancets MISC Use to test blood sugar once daily. DX E11.09    metoprolol succinate (TOPROL-XL) 25 MG 24 hr tablet Take 1 tablet (25 mg  total) by mouth daily.    Multiple Vitamins-Minerals (CENTRUM SILVER PO) Take 1 tablet by mouth daily with breakfast.    pantoprazole (PROTONIX) 40 MG tablet Take 1 tablet (40 mg total) by mouth daily.    Semaglutide, 1 MG/DOSE, (OZEMPIC, 1 MG/DOSE,) 4 MG/3ML SOPN Inject 1 mg into the skin once a week.    tamsulosin (FLOMAX) 0.4 MG CAPS capsule Take 1 capsule (0.4 mg total) by mouth daily.    [DISCONTINUED] telmisartan (MICARDIS) 40 MG tablet TAKE 1 TABLET BY MOUTH DAILY.    No facility-administered encounter medications on file as of 11/04/2021.   Contacted Dale Anderson for General Review Call   Chart Review:  Have there been any documented new, changed, or discontinued medications since last visit? No (If yes, include name, dose, frequency, date) Has there been any documented recent hospitalizations or ED visits since last visit with Clinical Pharmacist? No Brief Summary (including medication and/or Diagnosis changes):   Adherence Review:  Does the Clinical Pharmacist Assistant have access to adherence rates? Yes Adherence rates for STAR metric medications (List medication(s)/day supply/ last 2 fill dates). Adherence rates for medications indicated for disease state being reviewed (List medication(s)/day supply/ last 2 fill dates). Does the patient have >5 day gap between last estimated fill dates for any of the above medications or other medication gaps? No Reason for medication gaps.   Disease State Questions: Able to connect with Patient? Yes  Did patient have any problems with their health recently? No Note problems and Concerns:  Have you had any admissions or emergency room visits or worsening of your condition(s) since last visit? No Details of ED visit, hospital visit and/or worsening condition(s):  Have you had any visits with new specialists or providers since your last visit? No Explain:  Have you had any new health care problem(s) since your last visit?  No New problem(s) reported:  Have you run out of any of your medications since you last spoke with clinical pharmacist? No What caused you to run out of your medications?  Are there any medications you are not taking as prescribed? No What kept you from taking your medications as prescribed?  Are you having any issues or side effects with your medications? No Note of issues or side effects:  Do you have any other health concerns or questions you want to discuss with your Clinical Pharmacist before your next visit? No Note additional concerns and questions from Patient.  Are there any health concerns that you feel we can do a better job addressing? No Note Patient's response.  Are you having any problems with any of the following since the last visit: (select all that apply)  None  Details:  12. Any falls since last visit? No  Details:  13. Any increased or uncontrolled pain since last visit? No  Details:  14. Next visit Type: telephone       Visit with:clinical pharmacist        Date:01/22/22        Time:2:30pm  15. Additional Details? No    Care Gaps: Colonoscopy-NA Diabetic Foot Exam-12/28/20 Ophthalmology-12/10/18 Dexa Scan - NA Annual Well Visit - NA Micro albumin-10/17/20 Hemoglobin A1c-03/18/21   Star Rating Drugs: Atorvastatin 20 mg-last fill 10/16/21 90 ds    Ethelene Hal Clinical Pharmacist Assistant [phone]

## 2021-11-14 ENCOUNTER — Other Ambulatory Visit (HOSPITAL_COMMUNITY): Payer: Self-pay

## 2021-11-15 ENCOUNTER — Other Ambulatory Visit (HOSPITAL_COMMUNITY): Payer: Self-pay

## 2021-11-19 ENCOUNTER — Other Ambulatory Visit (HOSPITAL_COMMUNITY): Payer: Self-pay

## 2021-11-20 ENCOUNTER — Other Ambulatory Visit (HOSPITAL_COMMUNITY): Payer: Self-pay

## 2021-11-20 DIAGNOSIS — I10 Essential (primary) hypertension: Secondary | ICD-10-CM

## 2021-11-20 DIAGNOSIS — E1169 Type 2 diabetes mellitus with other specified complication: Secondary | ICD-10-CM

## 2021-11-20 DIAGNOSIS — Z794 Long term (current) use of insulin: Secondary | ICD-10-CM

## 2021-11-22 ENCOUNTER — Other Ambulatory Visit (HOSPITAL_COMMUNITY): Payer: Self-pay

## 2021-11-22 ENCOUNTER — Other Ambulatory Visit: Payer: Self-pay | Admitting: Internal Medicine

## 2021-11-22 DIAGNOSIS — E119 Type 2 diabetes mellitus without complications: Secondary | ICD-10-CM

## 2021-11-22 DIAGNOSIS — E118 Type 2 diabetes mellitus with unspecified complications: Secondary | ICD-10-CM

## 2021-11-22 MED ORDER — FREESTYLE LIBRE 2 SENSOR MISC
5 refills | Status: DC
  Filled 2021-11-22: qty 2, 28d supply, fill #0
  Filled 2022-01-09: qty 2, 28d supply, fill #1
  Filled 2022-02-12: qty 2, 28d supply, fill #2
  Filled 2022-03-18: qty 2, 28d supply, fill #3
  Filled 2022-04-23: qty 2, 28d supply, fill #4
  Filled 2022-06-18: qty 2, 28d supply, fill #5

## 2022-01-09 ENCOUNTER — Other Ambulatory Visit (HOSPITAL_COMMUNITY): Payer: Self-pay

## 2022-01-13 ENCOUNTER — Other Ambulatory Visit (HOSPITAL_COMMUNITY): Payer: Self-pay

## 2022-01-13 ENCOUNTER — Other Ambulatory Visit: Payer: Self-pay | Admitting: Pulmonary Disease

## 2022-01-15 ENCOUNTER — Other Ambulatory Visit: Payer: Self-pay | Admitting: Pulmonary Disease

## 2022-01-15 ENCOUNTER — Other Ambulatory Visit (HOSPITAL_COMMUNITY): Payer: Self-pay

## 2022-01-20 ENCOUNTER — Other Ambulatory Visit: Payer: Self-pay | Admitting: Pulmonary Disease

## 2022-01-20 ENCOUNTER — Other Ambulatory Visit (HOSPITAL_COMMUNITY): Payer: Self-pay

## 2022-01-21 ENCOUNTER — Other Ambulatory Visit (HOSPITAL_COMMUNITY): Payer: Self-pay

## 2022-01-22 ENCOUNTER — Telehealth: Payer: 59

## 2022-02-12 ENCOUNTER — Telehealth: Payer: Self-pay

## 2022-02-12 ENCOUNTER — Other Ambulatory Visit (HOSPITAL_COMMUNITY): Payer: Self-pay

## 2022-02-12 NOTE — Telephone Encounter (Signed)
Last OV notes on 09/26/21: Return in about 6 months (around 03/28/2022).  No future appt noted.  LVM instructions for pt to call office to schedule appt.

## 2022-02-19 ENCOUNTER — Other Ambulatory Visit (HOSPITAL_COMMUNITY): Payer: Self-pay

## 2022-02-28 ENCOUNTER — Other Ambulatory Visit: Payer: Self-pay

## 2022-02-28 ENCOUNTER — Emergency Department (HOSPITAL_BASED_OUTPATIENT_CLINIC_OR_DEPARTMENT_OTHER)
Admission: EM | Admit: 2022-02-28 | Discharge: 2022-02-28 | Disposition: A | Discharge status: Home / Self Care | Payer: 59 | Attending: Emergency Medicine | Admitting: Emergency Medicine

## 2022-02-28 ENCOUNTER — Other Ambulatory Visit (HOSPITAL_COMMUNITY): Payer: Self-pay

## 2022-02-28 ENCOUNTER — Emergency Department (HOSPITAL_BASED_OUTPATIENT_CLINIC_OR_DEPARTMENT_OTHER): Payer: 59 | Admitting: Radiology

## 2022-02-28 DIAGNOSIS — Z7901 Long term (current) use of anticoagulants: Secondary | ICD-10-CM | POA: Diagnosis not present

## 2022-02-28 DIAGNOSIS — Z20822 Contact with and (suspected) exposure to covid-19: Secondary | ICD-10-CM | POA: Insufficient documentation

## 2022-02-28 DIAGNOSIS — J209 Acute bronchitis, unspecified: Secondary | ICD-10-CM | POA: Insufficient documentation

## 2022-02-28 DIAGNOSIS — N189 Chronic kidney disease, unspecified: Secondary | ICD-10-CM | POA: Diagnosis not present

## 2022-02-28 DIAGNOSIS — R059 Cough, unspecified: Secondary | ICD-10-CM | POA: Diagnosis not present

## 2022-02-28 LAB — CBC WITH DIFFERENTIAL/PLATELET
Abs Immature Granulocytes: 0.02 10*3/uL (ref 0.00–0.07)
Basophils Absolute: 0 10*3/uL (ref 0.0–0.1)
Basophils Relative: 0 %
Eosinophils Absolute: 0 10*3/uL (ref 0.0–0.5)
Eosinophils Relative: 0 %
HCT: 37.1 % — ABNORMAL LOW (ref 39.0–52.0)
Hemoglobin: 12.4 g/dL — ABNORMAL LOW (ref 13.0–17.0)
Immature Granulocytes: 0 %
Lymphocytes Relative: 12 %
Lymphs Abs: 1 10*3/uL (ref 0.7–4.0)
MCH: 31.6 pg (ref 26.0–34.0)
MCHC: 33.4 g/dL (ref 30.0–36.0)
MCV: 94.6 fL (ref 80.0–100.0)
Monocytes Absolute: 0.9 10*3/uL (ref 0.1–1.0)
Monocytes Relative: 11 %
Neutro Abs: 6.5 10*3/uL (ref 1.7–7.7)
Neutrophils Relative %: 77 %
Platelets: 191 10*3/uL (ref 150–400)
RBC: 3.92 MIL/uL — ABNORMAL LOW (ref 4.22–5.81)
RDW: 13.2 % (ref 11.5–15.5)
WBC: 8.5 10*3/uL (ref 4.0–10.5)
nRBC: 0 % (ref 0.0–0.2)

## 2022-02-28 LAB — BASIC METABOLIC PANEL
Anion gap: 10 (ref 5–15)
BUN: 31 mg/dL — ABNORMAL HIGH (ref 8–23)
CO2: 25 mmol/L (ref 22–32)
Calcium: 9.3 mg/dL (ref 8.9–10.3)
Chloride: 105 mmol/L (ref 98–111)
Creatinine, Ser: 1.81 mg/dL — ABNORMAL HIGH (ref 0.61–1.24)
GFR, Estimated: 35 mL/min — ABNORMAL LOW (ref >60)
Glucose, Bld: 138 mg/dL — ABNORMAL HIGH (ref 70–99)
Potassium: 4.1 mmol/L (ref 3.5–5.1)
Sodium: 140 mmol/L (ref 135–145)

## 2022-02-28 LAB — RESP PANEL BY RT-PCR (FLU A&B, COVID) ARPGX2
Influenza A by PCR: NEGATIVE
Influenza B by PCR: NEGATIVE
SARS Coronavirus 2 by RT PCR: NEGATIVE

## 2022-02-28 MED ORDER — DOXYCYCLINE HYCLATE 100 MG PO CAPS
100.0000 mg | ORAL_CAPSULE | Freq: Two times a day (BID) | ORAL | 0 refills | Status: DC
  Filled 2022-02-28: qty 14, 7d supply, fill #0

## 2022-02-28 NOTE — Discharge Instructions (Signed)
You were seen in the emergency department for evaluation of cough.  You had blood work chest x-ray and COVID testing that did not show an obvious explanation for your symptoms.  We are putting you on an antibiotic to treat possible bronchitis.  Please rest and drink plenty of fluids.  Follow-up with your regular doctor.  Return to the emergency department if any worsening or concerning symptoms.

## 2022-02-28 NOTE — ED Triage Notes (Signed)
Cough x 5 days

## 2022-02-28 NOTE — ED Provider Notes (Signed)
[facility] EMERGENCY DEPT Provider Note   CSN: [id number] Arrival date & time: 02/28/22  3559     History  Chief Complaint  Patient presents with   Cough    Dale Anderson is a 88 y.o. male.  He is here with complaint of cold symptoms that have been going on for 4 days.  He has had a cough productive of some yellow sputum and some nasal congestion.  No fevers or chills no chest pain abdominal pain vomiting diarrhea.  He has tried nothing for his symptoms.  He has been eating and drinking well.  He said he actually feels better than when it started.  The history is provided by the patient.  Cough Cough characteristics:  Productive Sputum characteristics:  Yellow Severity:  Moderate Onset quality:  Gradual Duration:  4 days Timing:  Intermittent Progression:  Improving Chronicity:  New Smoker: no   Relieved by:  None tried Worsened by:  Nothing Ineffective treatments:  None tried Associated symptoms: rhinorrhea   Associated symptoms: no chest pain, no chills, no fever, no rash, no shortness of breath and no sore throat        Home Medications Prior to Admission medications   Medication Sig Start Date End Date Taking? Authorizing Provider  ALPRAZolam (XANAX) 0.5 MG tablet TAKE 1 TABLET BY MOUTH DAILY AS NEEDED FOR ANXIETY Patient taking differently: Take 0.5 mg by mouth daily as needed for anxiety. 01/23/20   [name], MD  apixaban (ELIQUIS) 2.5 MG TABS tablet Take 1 tablet (2.5 mg total) by mouth 2 (two) times daily. 09/10/21   [name], MD  atorvastatin (LIPITOR) 20 MG tablet Take 1 tablet (20 mg total) by mouth daily. 10/10/21   [name], MD  blood glucose meter kit and supplies KIT Use to test blood sugar once daily. DX: E11.9 07/24/16   [name], MD  Blood Glucose Monitoring Suppl (FREESTYLE LITE) DEVI  07/24/16   [provider]  Cholecalciferol (VITAMIN D-3) 25 MCG (1000 UT) CAPS Take 1,000 Units by mouth daily.     [provider]  Continuous Blood Gluc Receiver (FREESTYLE LIBRE 2 READER) DEVI USE AS DIRECTED 10/30/21 10/30/22  [name], MD  Continuous Blood Gluc Sensor (FREESTYLE LIBRE 2 SENSOR) MISC Use as directed. 11/22/21   [name], MD  glucose blood (FREESTYLE LITE) test strip USE TO CHECK BLOOD SUGAR ONCE DAILY 02/08/19   [name], MD  insulin glargine-yfgn (SEMGLEE, YFGN,) 100 UNIT/ML Pen Inject 10 units into the skin daily. 01/09/21   [name], MD  Insulin Pen Needle (UNIFINE PENTIPS) 31G X 6 MM MISC USE TO INJECT INSULIN 5 TIMES DAILY 05/27/21 05/27/22  [name], MD  L-Methylfolate-Algae-B12-B6 3-90.314-2-35 MG CAPS Take 1 capsule by mouth 2 (two) times daily. 03/08/21   [name], MD  Lancets MISC Use to test blood sugar once daily. DX E11.09 07/24/16   [name], MD  metoprolol succinate (TOPROL-XL) 25 MG 24 hr tablet Take 1 tablet (25 mg total) by mouth daily. 10/10/21   Belva Crome, MD  Multiple Vitamins-Minerals (CENTRUM SILVER PO) Take 1 tablet by mouth daily with breakfast.    [provider]  pantoprazole (PROTONIX) 40 MG tablet Take 1 tablet (40 mg total) by mouth daily. 10/10/21   [name], MD  Semaglutide, 1 MG/DOSE, (OZEMPIC, 1 MG/DOSE,) 4 MG/3ML SOPN Inject 1 mg into the skin once a week. 09/26/21  [name], MD  tamsulosin (FLOMAX) 0.4 MG CAPS capsule Take 1 capsule (0.4 mg total) by mouth daily. 07/15/21     telmisartan (MICARDIS) 40 MG tablet TAKE 1 TABLET BY MOUTH DAILY. 04/04/20 07/17/20  [name], MD      Allergies    Lisinopril    Review of Systems   Review of Systems  Constitutional:  Negative for chills and fever.  HENT:  Positive for rhinorrhea. Negative for sore throat.   Eyes:  Negative for visual disturbance.  Respiratory:  Positive for cough. Negative for shortness of breath.   Cardiovascular:  Negative for chest pain.  Gastrointestinal:  Negative for abdominal pain.  Genitourinary:  Negative  for dysuria.  Skin:  Negative for rash.    Physical Exam Updated Vital Signs BP 121/66 (BP Location: Left Arm)   Pulse 88   Temp 99.4 F (37.4 C)   Resp 18   Ht '5\' 11"'  (1.803 m)   Wt 71.7 kg   SpO2 96%   BMI 22.05 kg/m  Physical Exam Vitals and nursing note reviewed.  Constitutional:      General: He is not in acute distress.    Appearance: Normal appearance. He is well-developed.  HENT:     Head: Normocephalic and atraumatic.  Eyes:     Conjunctiva/sclera: Conjunctivae normal.  Cardiovascular:     Rate and Rhythm: Normal rate.     Heart sounds: No murmur heard. Pulmonary:     Effort: Pulmonary effort is normal. No respiratory distress.     Breath sounds: Normal breath sounds.  Abdominal:     Palpations: Abdomen is soft.     Tenderness: There is no abdominal tenderness. There is no guarding or rebound.  Musculoskeletal:     Cervical back: Neck supple.     Right lower leg: No edema.     Left lower leg: No edema.  Skin:    General: Skin is warm and dry.     Capillary Refill: Capillary refill takes less than 2 seconds.  Neurological:     General: No focal deficit present.     Mental Status: He is alert.     ED Results / Procedures / Treatments   Labs (all labs ordered are listed, but only abnormal results are displayed) Labs Reviewed  BASIC METABOLIC PANEL - Abnormal; Notable for the following components:      Result Value   Glucose, Bld 138 (*)    BUN 31 (*)    Creatinine, Ser 1.81 (*)    GFR, Estimated 35 (*)    All other components within normal limits  CBC WITH DIFFERENTIAL/PLATELET - Abnormal; Notable for the following components:   RBC 3.92 (*)    Hemoglobin 12.4 (*)    HCT 37.1 (*)    All other components within normal limits  RESP PANEL BY RT-PCR (FLU A&B, COVID) [id number]    EKG None  Radiology DG Chest 2 View  Result Date: 02/28/2022 CLINICAL DATA:  Cough EXAM: CHEST - 2 VIEW COMPARISON:  Chest radiograph Nov 12, 2017 FINDINGS: The heart size  and mediastinal contours are within normal limits. No focal consolidation. No pleural effusion. No pneumothorax. Thoracic spondylosis. IMPRESSION: No acute cardiopulmonary disease. Electronically Signed   By: Dahlia Bailiff M.D.   On: 02/28/2022 10:01    Procedures Procedures    Medications Ordered in ED Medications - No data to display  ED Course/ Medical Decision Making/ A&P Clinical Course as of 02/28/22 1749  Fri Feb 28, 2022  1004 Chest x-ray interpreted by me as no acute infiltrate.  Awaiting radiology reading. [MB]    Clinical Course User Index [MB] Hayden Rasmussen, MD                           Medical Decision Making Amount and/or Complexity of Data Reviewed Labs: ordered. Radiology: ordered.  Risk Prescription drug management.  Dale Anderson was evaluated in Emergency Department on 02/28/2022 for the symptoms described in the history of present illness. He was evaluated in the context of the global COVID-19 pandemic, which necessitated consideration that the patient might be at risk for infection with the SARS-CoV-2 virus that causes COVID-19. Institutional protocols and algorithms that pertain to the evaluation of patients at risk for COVID-19 are in a state of rapid change based on information released by regulatory bodies including the CDC and federal and state organizations. These policies and algorithms were followed during the patient's care in the ED. This patient complains of cough; this involves an extensive number of treatment Options and is a complaint that carries with it a high risk of complications and morbidity. The differential includes COVID, flu, pneumonia, bronchitis, CHF, viral syndrome  I ordered, reviewed and interpreted labs, which included CBC with normal white count stable hemoglobin, chemistries with mild CKD similar to priors, COVID and flu negative  I ordered imaging studies which included chest x-ray and I independently    visualized and  interpreted imaging which showed no acute infiltrates Previous records obtained and reviewed in epic no recent admissions  Cardiac monitoring reviewed, normal sinus rhythm Social determinants considered, no significant barriers Critical Interventions: None  After the interventions stated above, I reevaluated the patient and found patient to be hemodynamically stable oxygenating well on room air Admission and further testing considered, no indications for admission or further work-up at this time.  Will cover patient with antibiotics for possible bronchitis.  Recommended close follow-up with PCP.  Return instructions discussed          Final Clinical Impression(s) / ED Diagnoses Final diagnoses:  Acute bronchitis, unspecified organism    Rx / DC Orders ED Discharge Orders          Ordered    doxycycline (VIBRAMYCIN) 100 MG capsule  2 times daily        02/28/22 1021              Hayden Rasmussen, MD 02/28/22 1751

## 2022-03-18 ENCOUNTER — Other Ambulatory Visit (HOSPITAL_COMMUNITY): Payer: Self-pay

## 2022-03-25 ENCOUNTER — Ambulatory Visit: Payer: 59 | Admitting: Internal Medicine

## 2022-03-25 DIAGNOSIS — E1140 Type 2 diabetes mellitus with diabetic neuropathy, unspecified: Secondary | ICD-10-CM | POA: Diagnosis not present

## 2022-03-25 DIAGNOSIS — E785 Hyperlipidemia, unspecified: Secondary | ICD-10-CM | POA: Diagnosis not present

## 2022-03-25 DIAGNOSIS — Z794 Long term (current) use of insulin: Secondary | ICD-10-CM | POA: Diagnosis not present

## 2022-03-25 DIAGNOSIS — K219 Gastro-esophageal reflux disease without esophagitis: Secondary | ICD-10-CM | POA: Diagnosis not present

## 2022-03-25 DIAGNOSIS — E1169 Type 2 diabetes mellitus with other specified complication: Secondary | ICD-10-CM | POA: Diagnosis not present

## 2022-03-25 DIAGNOSIS — I10 Essential (primary) hypertension: Secondary | ICD-10-CM | POA: Diagnosis not present

## 2022-03-25 DIAGNOSIS — N400 Enlarged prostate without lower urinary tract symptoms: Secondary | ICD-10-CM | POA: Diagnosis not present

## 2022-04-23 ENCOUNTER — Other Ambulatory Visit (HOSPITAL_COMMUNITY): Payer: Self-pay

## 2022-04-24 ENCOUNTER — Other Ambulatory Visit: Payer: Self-pay | Admitting: Internal Medicine

## 2022-04-24 ENCOUNTER — Other Ambulatory Visit (HOSPITAL_COMMUNITY): Payer: Self-pay

## 2022-04-24 DIAGNOSIS — K219 Gastro-esophageal reflux disease without esophagitis: Secondary | ICD-10-CM

## 2022-04-25 ENCOUNTER — Other Ambulatory Visit (HOSPITAL_COMMUNITY): Payer: Self-pay

## 2022-04-25 ENCOUNTER — Telehealth: Payer: 59

## 2022-04-25 MED ORDER — PANTOPRAZOLE SODIUM 40 MG PO TBEC
40.0000 mg | DELAYED_RELEASE_TABLET | Freq: Every day | ORAL | 1 refills | Status: DC
  Filled 2022-04-25 – 2022-05-28 (×2): qty 90, 90d supply, fill #0
  Filled 2023-01-21: qty 90, 90d supply, fill #1

## 2022-05-01 ENCOUNTER — Other Ambulatory Visit (HOSPITAL_COMMUNITY): Payer: Self-pay

## 2022-05-28 ENCOUNTER — Other Ambulatory Visit: Payer: Self-pay | Admitting: Internal Medicine

## 2022-05-28 ENCOUNTER — Other Ambulatory Visit (HOSPITAL_COMMUNITY): Payer: Self-pay

## 2022-05-28 DIAGNOSIS — E118 Type 2 diabetes mellitus with unspecified complications: Secondary | ICD-10-CM

## 2022-05-28 DIAGNOSIS — E1142 Type 2 diabetes mellitus with diabetic polyneuropathy: Secondary | ICD-10-CM

## 2022-05-28 MED ORDER — L-METHYLFOLATE-ALGAE-B12-B6 3-90.314-2-35 MG PO CAPS
1.0000 | ORAL_CAPSULE | Freq: Two times a day (BID) | ORAL | 5 refills | Status: DC
  Filled 2022-05-28: qty 60, 30d supply, fill #0
  Filled 2022-10-20: qty 60, 30d supply, fill #1
  Filled 2023-01-21: qty 60, 30d supply, fill #2
  Filled 2023-04-01: qty 60, 30d supply, fill #3

## 2022-05-28 MED ORDER — OZEMPIC (1 MG/DOSE) 4 MG/3ML ~~LOC~~ SOPN
1.0000 mg | PEN_INJECTOR | SUBCUTANEOUS | 0 refills | Status: DC
  Filled 2022-05-28: qty 3, 28d supply, fill #0
  Filled 2022-07-09: qty 3, 28d supply, fill #1
  Filled 2022-08-21: qty 3, 28d supply, fill #2

## 2022-07-09 ENCOUNTER — Other Ambulatory Visit (HOSPITAL_COMMUNITY): Payer: Self-pay

## 2022-07-10 ENCOUNTER — Other Ambulatory Visit (HOSPITAL_COMMUNITY): Payer: Self-pay

## 2022-07-14 ENCOUNTER — Other Ambulatory Visit (HOSPITAL_COMMUNITY): Payer: Self-pay

## 2022-07-28 DIAGNOSIS — Z125 Encounter for screening for malignant neoplasm of prostate: Secondary | ICD-10-CM | POA: Diagnosis not present

## 2022-07-28 DIAGNOSIS — N401 Enlarged prostate with lower urinary tract symptoms: Secondary | ICD-10-CM | POA: Diagnosis not present

## 2022-07-28 DIAGNOSIS — R351 Nocturia: Secondary | ICD-10-CM | POA: Diagnosis not present

## 2022-08-06 ENCOUNTER — Other Ambulatory Visit: Payer: Self-pay | Admitting: Internal Medicine

## 2022-08-06 ENCOUNTER — Other Ambulatory Visit (HOSPITAL_COMMUNITY): Payer: Self-pay

## 2022-08-06 DIAGNOSIS — E119 Type 2 diabetes mellitus without complications: Secondary | ICD-10-CM

## 2022-08-06 DIAGNOSIS — E118 Type 2 diabetes mellitus with unspecified complications: Secondary | ICD-10-CM

## 2022-08-06 MED ORDER — FREESTYLE LIBRE 2 SENSOR MISC
5 refills | Status: DC
  Filled 2022-08-06: qty 2, 28d supply, fill #0
  Filled 2022-09-10: qty 2, 28d supply, fill #1
  Filled 2022-10-30: qty 2, 28d supply, fill #2
  Filled 2022-11-14 – 2022-12-10 (×2): qty 2, 28d supply, fill #3
  Filled 2023-02-04: qty 2, 28d supply, fill #4
  Filled 2023-04-03: qty 2, 28d supply, fill #5

## 2022-08-08 ENCOUNTER — Other Ambulatory Visit (HOSPITAL_COMMUNITY): Payer: Self-pay

## 2022-08-21 ENCOUNTER — Other Ambulatory Visit (HOSPITAL_COMMUNITY): Payer: Self-pay

## 2022-08-25 ENCOUNTER — Ambulatory Visit: Payer: PPO | Admitting: Podiatry

## 2022-08-25 DIAGNOSIS — E119 Type 2 diabetes mellitus without complications: Secondary | ICD-10-CM

## 2022-08-25 DIAGNOSIS — E1149 Type 2 diabetes mellitus with other diabetic neurological complication: Secondary | ICD-10-CM | POA: Diagnosis not present

## 2022-08-25 NOTE — Patient Instructions (Signed)

## 2022-08-25 NOTE — Progress Notes (Unsigned)
Subjective: States they feel tight like swollen and numbness which ahas bene ongoing for some timeno burning or tingling. No open sores    Objective: AAO x3, NAD DP/PT pulses palpable bilaterally, CRT less than 3 seconds Protective sensation intact with Simms Weinstein monofilament, vibratory sensation intact Mild decreased Callus right hallux No pain with calf compression, swelling, warmth, erythema  Assessment:  Plan: -All treatment options discussed with the patient including all alternatives, risks, complications.   -Patient encouraged to call the office with any questions, concerns, change in symptoms.

## 2022-09-10 ENCOUNTER — Other Ambulatory Visit (HOSPITAL_COMMUNITY): Payer: Self-pay

## 2022-09-21 IMAGING — DX DG WRIST COMPLETE 3+V*R*
4 series · 4 of 4 positions shown · non-contrast
Comparison: None.

CLINICAL DATA: Right wrist pain and swelling

EXAM:
RIGHT WRIST - COMPLETE 3+ VIEW

[wrist ap]
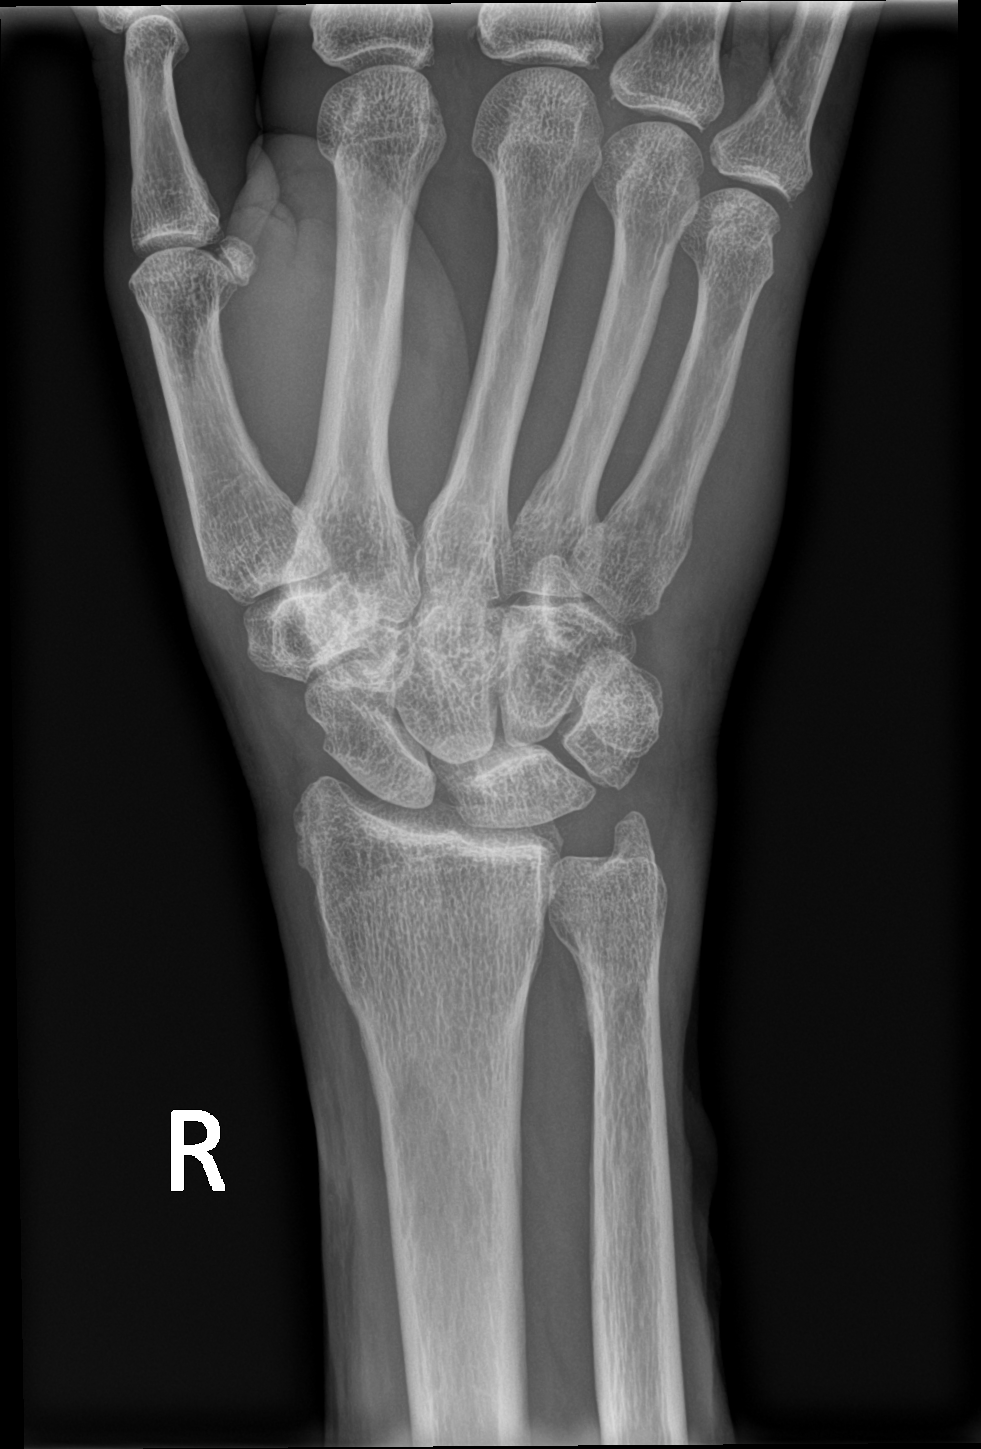

[wrist obl]
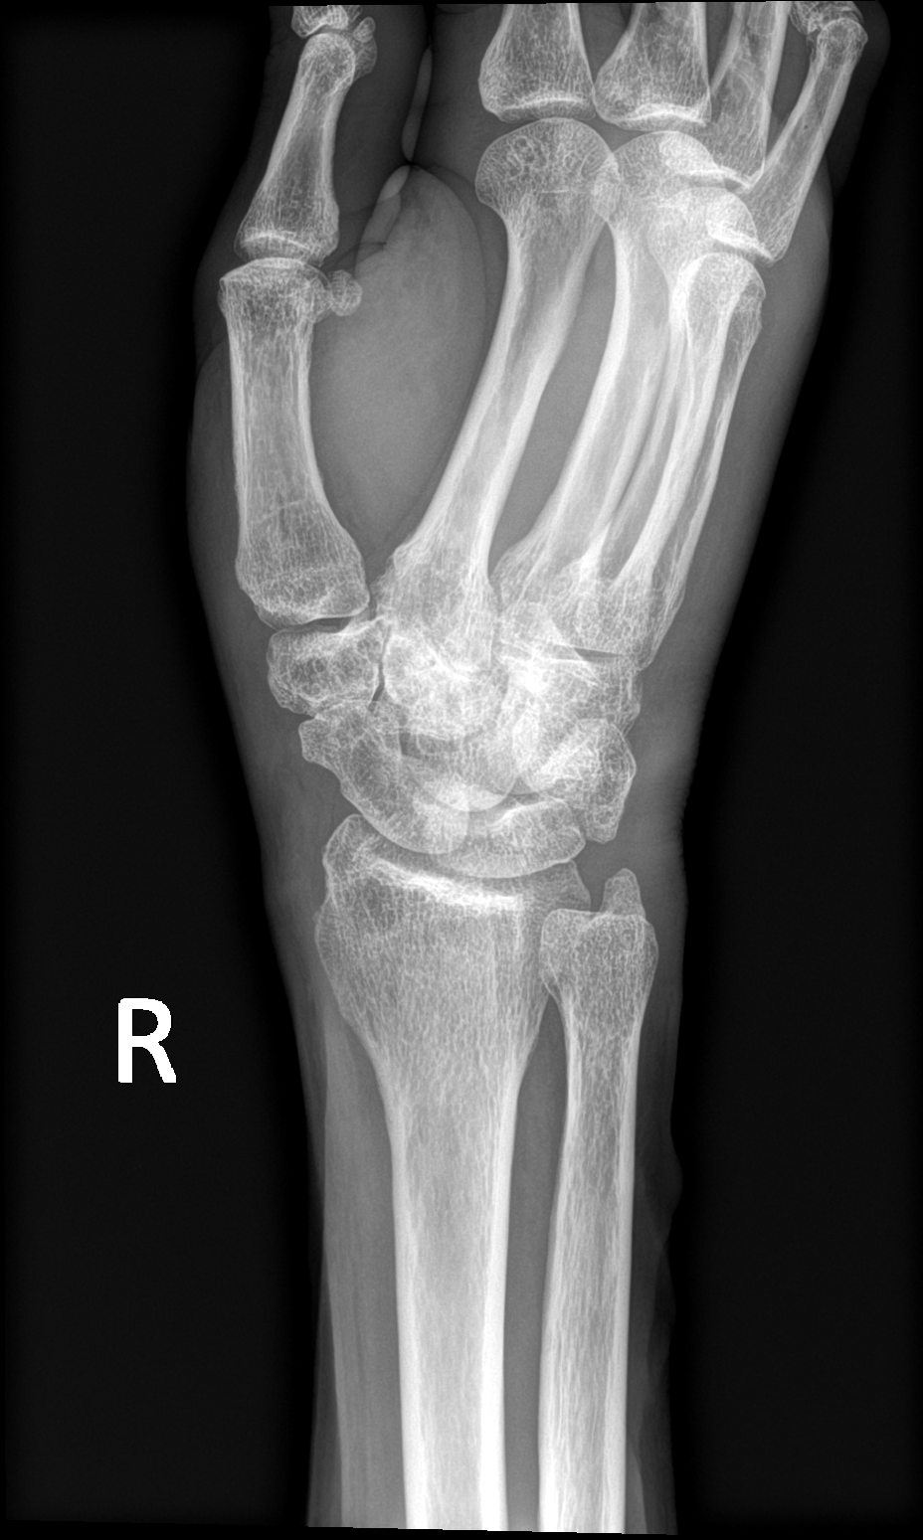

[wrist lat]
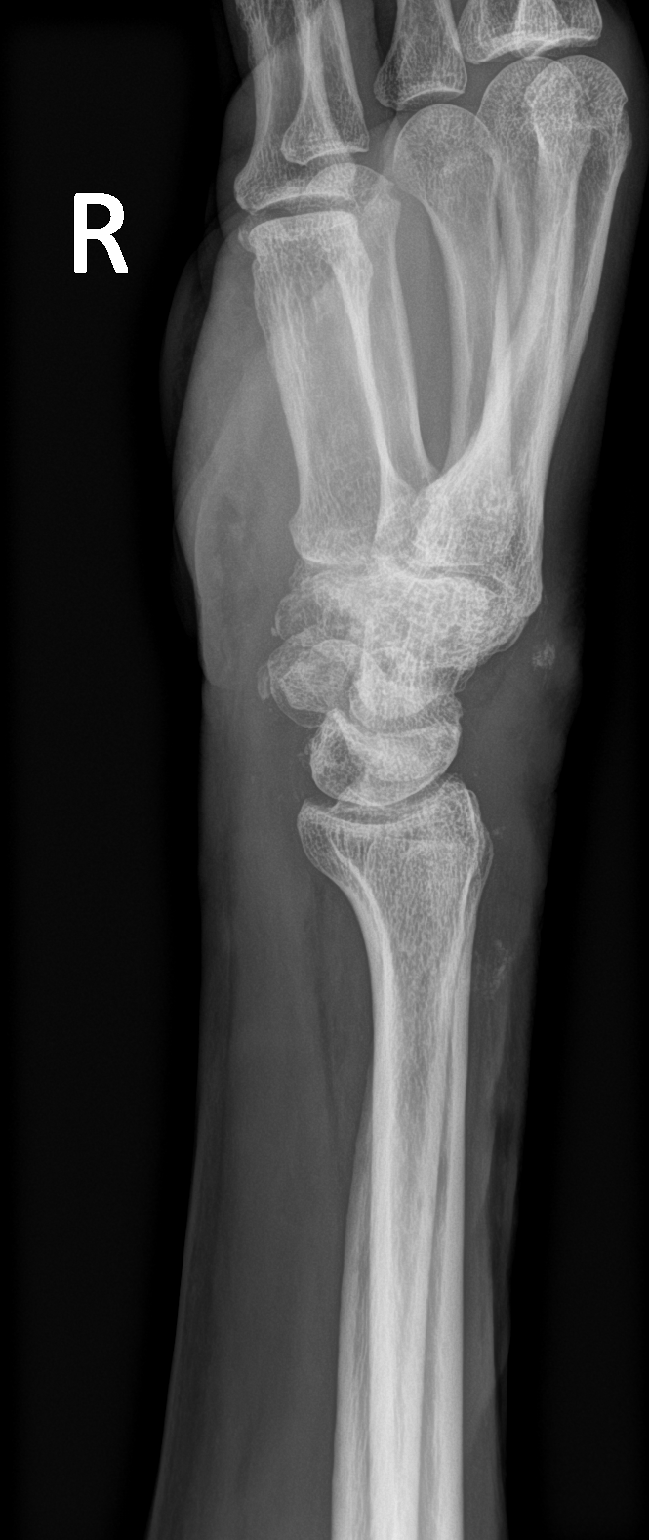

[wrist navicular]
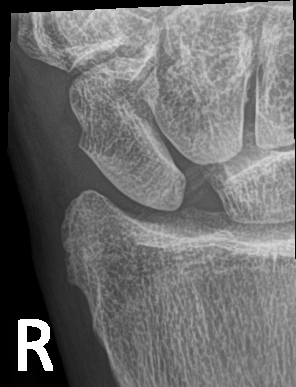

[4 of 4 positions shown; findings below may reference images not displayed]

FINDINGS: Mild spurring in the carpus most notably between the scaphoid and
trapezium. Mild spurring at the first carpometacarpal articulation.
No visible fracture or acute bony findings.

Speckle vascular calcifications. Potentially mildly widened
scapholunate space with scapholunate angle of 82 degrees, suspicious
for possible scapholunate ligament tear.

Mild soft tissue swelling dorsally and volar to the wrist.
IMPRESSION: 1. Degenerative findings in the lateral carpus.
2. Exaggerated scapholunate angle potentially reflecting
scapholunate ligament tear.
3. Vascular calcifications in the wrist region.
4. Mild regional soft tissue swelling.

## 2022-09-25 ENCOUNTER — Other Ambulatory Visit (HOSPITAL_COMMUNITY): Payer: Self-pay

## 2022-10-01 ENCOUNTER — Other Ambulatory Visit: Payer: Self-pay | Admitting: Internal Medicine

## 2022-10-01 ENCOUNTER — Other Ambulatory Visit (HOSPITAL_COMMUNITY): Payer: Self-pay

## 2022-10-01 DIAGNOSIS — E118 Type 2 diabetes mellitus with unspecified complications: Secondary | ICD-10-CM

## 2022-10-01 MED ORDER — OZEMPIC (1 MG/DOSE) 4 MG/3ML ~~LOC~~ SOPN
1.0000 mg | PEN_INJECTOR | SUBCUTANEOUS | 0 refills | Status: DC
  Filled 2022-10-01 – 2022-10-16 (×3): qty 9, 84d supply, fill #0

## 2022-10-02 ENCOUNTER — Other Ambulatory Visit (HOSPITAL_COMMUNITY): Payer: Self-pay

## 2022-10-03 ENCOUNTER — Other Ambulatory Visit (HOSPITAL_COMMUNITY): Payer: Self-pay

## 2022-10-16 ENCOUNTER — Other Ambulatory Visit (HOSPITAL_COMMUNITY): Payer: Self-pay

## 2022-10-20 ENCOUNTER — Other Ambulatory Visit (HOSPITAL_COMMUNITY): Payer: Self-pay

## 2022-10-20 ENCOUNTER — Other Ambulatory Visit: Payer: Self-pay

## 2022-10-20 MED ORDER — TAMSULOSIN HCL 0.4 MG PO CAPS
0.4000 mg | ORAL_CAPSULE | Freq: Every day | ORAL | 3 refills | Status: AC
  Filled 2022-10-20: qty 90, 90d supply, fill #0
  Filled 2023-08-18: qty 90, 90d supply, fill #1

## 2022-10-21 ENCOUNTER — Other Ambulatory Visit (HOSPITAL_COMMUNITY): Payer: Self-pay

## 2022-10-29 ENCOUNTER — Other Ambulatory Visit: Payer: Self-pay | Admitting: Internal Medicine

## 2022-10-29 ENCOUNTER — Other Ambulatory Visit (HOSPITAL_COMMUNITY): Payer: Self-pay

## 2022-10-29 ENCOUNTER — Encounter: Payer: Self-pay | Admitting: Internal Medicine

## 2022-10-29 ENCOUNTER — Ambulatory Visit (INDEPENDENT_AMBULATORY_CARE_PROVIDER_SITE_OTHER): Payer: PPO | Admitting: Internal Medicine

## 2022-10-29 ENCOUNTER — Ambulatory Visit (INDEPENDENT_AMBULATORY_CARE_PROVIDER_SITE_OTHER): Payer: PPO

## 2022-10-29 VITALS — BP 138/86 | HR 94 | Temp 98.4°F | Resp 16 | Ht 71.0 in | Wt 157.0 lb

## 2022-10-29 DIAGNOSIS — N1832 Chronic kidney disease, stage 3b: Secondary | ICD-10-CM

## 2022-10-29 DIAGNOSIS — Z7985 Long-term (current) use of injectable non-insulin antidiabetic drugs: Secondary | ICD-10-CM | POA: Diagnosis not present

## 2022-10-29 DIAGNOSIS — Z23 Encounter for immunization: Secondary | ICD-10-CM

## 2022-10-29 DIAGNOSIS — E118 Type 2 diabetes mellitus with unspecified complications: Secondary | ICD-10-CM

## 2022-10-29 DIAGNOSIS — J90 Pleural effusion, not elsewhere classified: Secondary | ICD-10-CM | POA: Diagnosis not present

## 2022-10-29 DIAGNOSIS — I10 Essential (primary) hypertension: Secondary | ICD-10-CM | POA: Diagnosis not present

## 2022-10-29 DIAGNOSIS — Z0001 Encounter for general adult medical examination with abnormal findings: Secondary | ICD-10-CM

## 2022-10-29 DIAGNOSIS — E1142 Type 2 diabetes mellitus with diabetic polyneuropathy: Secondary | ICD-10-CM

## 2022-10-29 DIAGNOSIS — R059 Cough, unspecified: Secondary | ICD-10-CM | POA: Diagnosis not present

## 2022-10-29 DIAGNOSIS — J22 Unspecified acute lower respiratory infection: Secondary | ICD-10-CM | POA: Diagnosis not present

## 2022-10-29 DIAGNOSIS — Z794 Long term (current) use of insulin: Secondary | ICD-10-CM

## 2022-10-29 DIAGNOSIS — I4891 Unspecified atrial fibrillation: Secondary | ICD-10-CM

## 2022-10-29 DIAGNOSIS — R051 Acute cough: Secondary | ICD-10-CM | POA: Diagnosis not present

## 2022-10-29 DIAGNOSIS — E785 Hyperlipidemia, unspecified: Secondary | ICD-10-CM

## 2022-10-29 LAB — CBC WITH DIFFERENTIAL/PLATELET
Basophils Absolute: 0 10*3/uL (ref 0.0–0.1)
Basophils Relative: 0.4 % (ref 0.0–3.0)
Eosinophils Absolute: 0.2 10*3/uL (ref 0.0–0.7)
Eosinophils Relative: 3.6 % (ref 0.0–5.0)
HCT: 36.4 % — ABNORMAL LOW (ref 39.0–52.0)
Hemoglobin: 12.5 g/dL — ABNORMAL LOW (ref 13.0–17.0)
Lymphocytes Relative: 24 % (ref 12.0–46.0)
Lymphs Abs: 1.5 10*3/uL (ref 0.7–4.0)
MCHC: 34.3 g/dL (ref 30.0–36.0)
MCV: 94.3 fl (ref 78.0–100.0)
Monocytes Absolute: 0.9 10*3/uL (ref 0.1–1.0)
Monocytes Relative: 13.9 % — ABNORMAL HIGH (ref 3.0–12.0)
Neutro Abs: 3.7 10*3/uL (ref 1.4–7.7)
Neutrophils Relative %: 58.1 % (ref 43.0–77.0)
Platelets: 231 10*3/uL (ref 150.0–400.0)
RBC: 3.86 Mil/uL — ABNORMAL LOW (ref 4.22–5.81)
RDW: 14.2 % (ref 11.5–15.5)
WBC: 6.4 10*3/uL (ref 4.0–10.5)

## 2022-10-29 LAB — HEPATIC FUNCTION PANEL
ALT: 18 U/L (ref 0–53)
AST: 20 U/L (ref 0–37)
Albumin: 3.9 g/dL (ref 3.5–5.2)
Alkaline Phosphatase: 62 U/L (ref 39–117)
Bilirubin, Direct: 0.1 mg/dL (ref 0.0–0.3)
Total Bilirubin: 0.6 mg/dL (ref 0.2–1.2)
Total Protein: 7.1 g/dL (ref 6.0–8.3)

## 2022-10-29 LAB — URINALYSIS, ROUTINE W REFLEX MICROSCOPIC
Bilirubin Urine: NEGATIVE
Hgb urine dipstick: NEGATIVE
Ketones, ur: NEGATIVE
Leukocytes,Ua: NEGATIVE
Nitrite: NEGATIVE
RBC / HPF: NONE SEEN (ref 0–?)
Specific Gravity, Urine: 1.025 (ref 1.000–1.030)
Urine Glucose: NEGATIVE
Urobilinogen, UA: 0.2 (ref 0.0–1.0)
pH: 5.5 (ref 5.0–8.0)

## 2022-10-29 LAB — LIPID PANEL
Cholesterol: 165 mg/dL (ref 0–200)
HDL: 42.8 mg/dL (ref >39.00)
LDL Cholesterol: 97 mg/dL (ref 0–99)
NonHDL: 122.64
Total CHOL/HDL Ratio: 4
Triglycerides: 127 mg/dL (ref 0.0–149.0)
VLDL: 25.4 mg/dL (ref 0.0–40.0)

## 2022-10-29 LAB — HEMOGLOBIN A1C: Hgb A1c MFr Bld: 6.6 % — ABNORMAL HIGH (ref 4.6–6.5)

## 2022-10-29 LAB — BASIC METABOLIC PANEL
BUN: 28 mg/dL — ABNORMAL HIGH (ref 6–23)
CO2: 24 mEq/L (ref 19–32)
Calcium: 8.8 mg/dL (ref 8.4–10.5)
Chloride: 110 mEq/L (ref 96–112)
Creatinine, Ser: 1.6 mg/dL — ABNORMAL HIGH (ref 0.40–1.50)
GFR: 37.71 mL/min — ABNORMAL LOW (ref >60.00)
Glucose, Bld: 102 mg/dL — ABNORMAL HIGH (ref 70–99)
Potassium: 4.4 mEq/L (ref 3.5–5.1)
Sodium: 140 mEq/L (ref 135–145)

## 2022-10-29 LAB — TSH: TSH: 3.01 u[IU]/mL (ref 0.35–5.50)

## 2022-10-29 MED ORDER — CEFDINIR 125 MG/5ML PO SUSR
125.0000 mg | Freq: Two times a day (BID) | ORAL | 0 refills | Status: AC
Start: 2022-10-29 — End: 2022-11-08
  Filled 2022-10-29: qty 100, 10d supply, fill #0

## 2022-10-29 MED ORDER — APIXABAN 2.5 MG PO TABS
2.5000 mg | ORAL_TABLET | Freq: Two times a day (BID) | ORAL | 1 refills | Status: AC
Start: 2022-10-29 — End: ?
  Filled 2022-10-29: qty 180, 90d supply, fill #0
  Filled 2023-08-18: qty 180, 90d supply, fill #1

## 2022-10-29 MED ORDER — HYDROCODONE BIT-HOMATROP MBR 5-1.5 MG/5ML PO SOLN
5.0000 mL | Freq: Three times a day (TID) | ORAL | 0 refills | Status: AC | PRN
Start: 2022-10-29 — End: 2022-11-07
  Filled 2022-10-29: qty 120, 8d supply, fill #0

## 2022-10-29 NOTE — Progress Notes (Unsigned)
Subjective:  Patient ID: Dale Anderson, male    DOB: 1930/03/04  Age: 89 y.o. MRN: [id number]  CC: Cough, Anemia, Atrial Fibrillation, Annual Exam, Diabetes, Hyperlipidemia, and Hypertension   HPI Dale Anderson presents for a CPX and f/up ---  He complains or a 4 day history of cough productive of yellow phlegm.  Outpatient Medications Prior to Visit  Medication Sig Dispense Refill   ALPRAZolam (XANAX) 0.5 MG tablet TAKE 1 TABLET BY MOUTH DAILY AS NEEDED FOR ANXIETY (Patient taking differently: Take 0.5 mg by mouth daily as needed for anxiety.) 30 tablet 3   atorvastatin (LIPITOR) 20 MG tablet Take 1 tablet (20 mg total) by mouth daily. 90 tablet 1   blood glucose meter kit and supplies KIT Use to test blood sugar once daily. DX: E11.9 1 each 0   Blood Glucose Monitoring Suppl (FREESTYLE LITE) DEVI   0   Cholecalciferol (VITAMIN D-3) 25 MCG (1000 UT) CAPS Take 1,000 Units by mouth daily.     Continuous Blood Gluc Receiver (FREESTYLE LIBRE 2 READER) DEVI USE AS DIRECTED 2 each 5   Continuous Blood Gluc Sensor (FREESTYLE LIBRE 2 SENSOR) MISC Use as directed. 2 each 5   doxycycline (VIBRAMYCIN) 100 MG capsule Take 1 capsule (100 mg total) by mouth 2 (two) times daily. 14 capsule 0   glucose blood (FREESTYLE LITE) test strip USE TO CHECK BLOOD SUGAR ONCE DAILY 100 strip 1   insulin glargine-yfgn (SEMGLEE, YFGN,) 100 UNIT/ML Pen Inject 10 units into the skin daily. 9 mL 3   L-Methylfolate-Algae-B12-B6 3-90.314-2-35 MG CAPS Take 1 capsule by mouth 2 (two) times daily. 60 capsule 5   Lancets MISC Use to test blood sugar once daily. DX E11.09 100 each 3   metoprolol succinate (TOPROL-XL) 25 MG 24 hr tablet Take 1 tablet (25 mg total) by mouth daily. 90 tablet 2   Multiple Vitamins-Minerals (CENTRUM SILVER PO) Take 1 tablet by mouth daily with breakfast.     pantoprazole (PROTONIX) 40 MG tablet Take 1 tablet (40 mg total) by mouth daily. 90 tablet 1   Semaglutide, 1 MG/DOSE, (OZEMPIC,  1 MG/DOSE,) 4 MG/3ML SOPN Inject 1 mg into the skin once a week. 9 mL 0   tamsulosin (FLOMAX) 0.4 MG CAPS capsule Take 1 capsule (0.4 mg total) by mouth daily. 90 capsule 3   apixaban (ELIQUIS) 2.5 MG TABS tablet Take 1 tablet (2.5 mg total) by mouth 2 (two) times daily. 180 tablet 1   No facility-administered medications prior to visit.    ROS Review of Systems  Objective:  BP 138/86   Pulse 94   Temp 98.4 F (36.9 C) (Oral)   Resp 16   Ht 5\' 11"  (1.803 m)   Wt 157 lb (71.2 kg)   SpO2 99%   BMI 21.90 kg/m   BP Readings from Last 3 Encounters:  10/29/22 138/86  02/28/22 138/85  10/11/21 (!) 151/90    Wt Readings from Last 3 Encounters:  10/29/22 157 lb (71.2 kg)  02/28/22 158 lb 1.1 oz (71.7 kg)  10/11/21 158 lb (71.7 kg)    Physical Exam Vitals reviewed.  Constitutional:      Appearance: Normal appearance.  Cardiovascular:     Rate and Rhythm: Normal rate. Rhythm irregularly irregular.     Heart sounds: Murmur heard.     Systolic murmur is present with a grade of 1/6.     No gallop. No S4 sounds.  Musculoskeletal:     Right lower  leg: No edema.     Left lower leg: No edema.  Neurological:     Mental Status: He is alert.     Lab Results  Component Value Date   WBC 6.4 10/29/2022   HGB 12.5 (L) 10/29/2022   HCT 36.4 (L) 10/29/2022   PLT 231.0 10/29/2022   GLUCOSE 102 (H) 10/29/2022   CHOL 165 10/29/2022   TRIG 127.0 10/29/2022   HDL 42.80 10/29/2022   LDLCALC 97 10/29/2022   ALT 18 10/29/2022   AST 20 10/29/2022   NA 140 10/29/2022   K 4.4 10/29/2022   CL 110 10/29/2022   CREATININE 1.60 (H) 10/29/2022   BUN 28 (H) 10/29/2022   CO2 24 10/29/2022   TSH 3.01 10/29/2022   PSA 2.55 08/21/2015   INR 1.0 05/14/2021   HGBA1C 6.6 (H) 10/29/2022   MICROALBUR 10.0 (H) 10/17/2020    DG Chest 2 View  Result Date: 02/28/2022 CLINICAL DATA:  Cough EXAM: CHEST - 2 VIEW COMPARISON:  Chest radiograph Nov 12, 2017 FINDINGS: The heart size and mediastinal  contours are within normal limits. No focal consolidation. No pleural effusion. No pneumothorax. Thoracic spondylosis. IMPRESSION: No acute cardiopulmonary disease. Electronically Signed   By: Maudry Mayhew M.D.   On: 02/28/2022 10:01   DG Chest 2 View  Result Date: 10/29/2022 CLINICAL DATA:  Cough. EXAM: CHEST - 2 VIEW COMPARISON:  02/28/2022 FINDINGS: Heart size is normal. The lungs are free of focal consolidations and pleural effusions. No pulmonary edema. There is moderate degenerative change of the thoracic spine. IMPRESSION: No active cardiopulmonary disease. Electronically Signed   By: Norva Pavlov M.D.   On: 10/29/2022 14:05     Assessment & Plan:    Atrial fibrillation by electrocardiogram (HCC) -     TSH; Future -     Apixaban; Take 1 tablet (2.5 mg total) by mouth 2 (two) times daily.  Dispense: 180 tablet; Refill: 1  Diabetic polyneuropathy associated with type 2 diabetes mellitus (HCC)  Encounter for general adult medical examination with abnormal findings  Hyperlipidemia with target LDL less than 100 -     Lipid panel; Future -     Hepatic function panel; Future  Primary hypertension -     Urinalysis, Routine w reflex microscopic; Future -     Basic metabolic panel; Future -     CBC with Differential/Platelet; Future  Type II diabetes mellitus with manifestations (HCC) -     Hemoglobin A1c; Future -     Basic metabolic panel; Future  LRTI (lower respiratory tract infection) -     Cefdinir; Take 5 mLs (125 mg total) by mouth 2 (two) times daily for 7 days. Discard remaining.  Dispense: 100 mL; Refill: 0 -     HYDROcodone Bit-Homatrop MBr; Take 5 mLs by mouth every 8 (eight) hours as needed for up to 8 days for cough.  Dispense: 120 mL; Refill: 0  Stage 3b chronic kidney disease (HCC)     Follow-up: No follow-ups on file.  Sanda Linger, MD

## 2022-10-30 ENCOUNTER — Other Ambulatory Visit (HOSPITAL_COMMUNITY): Payer: Self-pay

## 2022-10-30 MED ORDER — ATORVASTATIN CALCIUM 20 MG PO TABS
20.0000 mg | ORAL_TABLET | Freq: Every day | ORAL | 1 refills | Status: DC
Start: 2022-10-30 — End: 2023-08-18
  Filled 2022-10-30: qty 90, 90d supply, fill #0
  Filled 2023-04-02: qty 90, 90d supply, fill #1

## 2022-10-30 MED ORDER — BOOSTRIX 5-2.5-18.5 LF-MCG/0.5 IM SUSP
0.5000 mL | Freq: Once | INTRAMUSCULAR | 0 refills | Status: AC
Start: 2022-10-30 — End: 2022-10-31
  Filled 2022-10-30: qty 0.5, 1d supply, fill #0

## 2022-11-14 ENCOUNTER — Other Ambulatory Visit (HOSPITAL_COMMUNITY): Payer: Self-pay

## 2022-11-15 ENCOUNTER — Other Ambulatory Visit (HOSPITAL_COMMUNITY): Payer: Self-pay

## 2022-11-17 ENCOUNTER — Other Ambulatory Visit (HOSPITAL_COMMUNITY): Payer: Self-pay

## 2022-11-17 ENCOUNTER — Telehealth: Payer: Self-pay

## 2022-11-17 NOTE — Telephone Encounter (Signed)
Pharmacy Patient Advocate Encounter  Prior Authorization for Ozempic (1 MG/DOSE) 4MG /3ML pen-injectors  has been approved by [employer] (ins).     Effective dates: 10/28/2022 through 0507/2025 Georga Bora Rx Patient Advocate [phone]

## 2022-11-20 ENCOUNTER — Other Ambulatory Visit (HOSPITAL_COMMUNITY): Payer: Self-pay

## 2022-12-02 ENCOUNTER — Other Ambulatory Visit (HOSPITAL_COMMUNITY): Payer: Self-pay

## 2022-12-10 ENCOUNTER — Other Ambulatory Visit (HOSPITAL_COMMUNITY): Payer: Self-pay

## 2022-12-24 ENCOUNTER — Other Ambulatory Visit (HOSPITAL_COMMUNITY): Payer: Self-pay

## 2022-12-24 ENCOUNTER — Other Ambulatory Visit: Payer: Self-pay | Admitting: Internal Medicine

## 2022-12-24 DIAGNOSIS — E118 Type 2 diabetes mellitus with unspecified complications: Secondary | ICD-10-CM

## 2022-12-24 DIAGNOSIS — E119 Type 2 diabetes mellitus without complications: Secondary | ICD-10-CM

## 2022-12-24 MED ORDER — FREESTYLE LIBRE 2 READER DEVI
5 refills | Status: AC
Start: 2022-12-24 — End: 2023-12-24
  Filled 2022-12-24 – 2023-08-13 (×2): qty 1, 90d supply, fill #0
  Filled 2023-08-13: qty 1, 90d supply, fill #1

## 2022-12-30 ENCOUNTER — Encounter: Payer: Self-pay | Admitting: Internal Medicine

## 2022-12-30 ENCOUNTER — Ambulatory Visit (INDEPENDENT_AMBULATORY_CARE_PROVIDER_SITE_OTHER): Payer: PPO | Admitting: Internal Medicine

## 2022-12-30 VITALS — BP 146/76 | HR 60 | Temp 97.9°F | Ht 71.0 in | Wt 160.0 lb

## 2022-12-30 DIAGNOSIS — N1832 Chronic kidney disease, stage 3b: Secondary | ICD-10-CM | POA: Diagnosis not present

## 2022-12-30 DIAGNOSIS — E118 Type 2 diabetes mellitus with unspecified complications: Secondary | ICD-10-CM | POA: Diagnosis not present

## 2022-12-30 DIAGNOSIS — I4891 Unspecified atrial fibrillation: Secondary | ICD-10-CM | POA: Diagnosis not present

## 2022-12-30 DIAGNOSIS — I10 Essential (primary) hypertension: Secondary | ICD-10-CM

## 2022-12-30 NOTE — Progress Notes (Signed)
Subjective:  Patient ID: Dale Anderson, male    DOB: 09-17-1929  Age: 89 y.o. MRN: [id number]  CC: Hypertension, Diabetes, Anemia, and Hyperlipidemia   HPI Aero Drummonds Underwood presents for f/up ----  Discussed the use of AI scribe software for clinical note transcription with the patient, who gave verbal consent to proceed.  History of Present Illness   The patient, with a history of diabetes, reports nocturia and dizziness upon standing, particularly noticeable after lying down overnight. They deny symptoms of hyperglycemia or hypoglycemia, such as excessive thirst or changes in weight or appetite. They are not currently using Ozempic, a once-weekly medication, but they do take daily insulin. They also report dietary modifications for blood sugar control.  In addition to the above, the patient experiences a bowel movement following meals, which they identify as a significant problem. They deny any chest pain, shortness of breath, or abdominal pain. Despite these health concerns, the patient remains active.       Outpatient Medications Prior to Visit  Medication Sig Dispense Refill   ALPRAZolam (XANAX) 0.5 MG tablet TAKE 1 TABLET BY MOUTH DAILY AS NEEDED FOR ANXIETY (Patient taking differently: Take 0.5 mg by mouth daily as needed for anxiety.) 30 tablet 3   apixaban (ELIQUIS) 2.5 MG TABS tablet Take 1 tablet (2.5 mg total) by mouth 2 (two) times daily. 180 tablet 1   atorvastatin (LIPITOR) 20 MG tablet Take 1 tablet (20 mg total) by mouth daily. 90 tablet 1   blood glucose meter kit and supplies KIT Use to test blood sugar once daily. DX: E11.9 1 each 0   Blood Glucose Monitoring Suppl (FREESTYLE LITE) DEVI   0   Cholecalciferol (VITAMIN D-3) 25 MCG (1000 UT) CAPS Take 1,000 Units by mouth daily.     Continuous Glucose Receiver (FREESTYLE LIBRE 2 READER) DEVI USE AS DIRECTED 2 each 5   Continuous Glucose Sensor (FREESTYLE LIBRE 2 SENSOR) MISC Use as directed. 2 each 5   glucose  blood (FREESTYLE LITE) test strip USE TO CHECK BLOOD SUGAR ONCE DAILY 100 strip 1   insulin glargine-yfgn (SEMGLEE, YFGN,) 100 UNIT/ML Pen Inject 10 units into the skin daily. 9 mL 3   L-Methylfolate-Algae-B12-B6 3-90.314-2-35 MG CAPS Take 1 capsule by mouth 2 (two) times daily. 60 capsule 5   Lancets MISC Use to test blood sugar once daily. DX E11.09 100 each 3   metoprolol succinate (TOPROL-XL) 25 MG 24 hr tablet Take 1 tablet (25 mg total) by mouth daily. 90 tablet 2   Multiple Vitamins-Minerals (CENTRUM SILVER PO) Take 1 tablet by mouth daily with breakfast.     pantoprazole (PROTONIX) 40 MG tablet Take 1 tablet (40 mg total) by mouth daily. 90 tablet 1   Semaglutide, 1 MG/DOSE, (OZEMPIC, 1 MG/DOSE,) 4 MG/3ML SOPN Inject 1 mg into the skin once a week. 9 mL 0   tamsulosin (FLOMAX) 0.4 MG CAPS capsule Take 1 capsule (0.4 mg total) by mouth daily. 90 capsule 3   No facility-administered medications prior to visit.    ROS Review of Systems  Constitutional: Negative.  Negative for chills, diaphoresis, fatigue and fever.  HENT: Negative.    Eyes: Negative.   Respiratory: Negative.  Negative for cough, chest tightness, shortness of breath and wheezing.   Cardiovascular:  Negative for chest pain, palpitations and leg swelling.  Gastrointestinal:  Negative for abdominal pain, constipation, diarrhea, nausea and vomiting.  Endocrine: Negative.   Genitourinary:  Positive for difficulty urinating.  Musculoskeletal:  Positive  for arthralgias and back pain. Negative for myalgias.  Skin: Negative.   Neurological:  Positive for dizziness. Negative for weakness.  Hematological:  Negative for adenopathy. Does not bruise/bleed easily.  Psychiatric/Behavioral: Negative.      Objective:  BP (!) 146/76 (BP Location: Right Arm, Patient Position: Sitting, Cuff Size: Large)   Pulse 60   Temp 97.9 F (36.6 C)   Ht 5\' 11"  (1.803 m)   Wt 160 lb (72.6 kg)   SpO2 94%   BMI 22.32 kg/m   BP Readings  from Last 3 Encounters:  12/30/22 (!) 146/76  10/29/22 138/86  02/28/22 138/85    Wt Readings from Last 3 Encounters:  12/30/22 160 lb (72.6 kg)  10/29/22 157 lb (71.2 kg)  02/28/22 158 lb 1.1 oz (71.7 kg)    Physical Exam Vitals reviewed.  Constitutional:      Appearance: He is not ill-appearing.  HENT:     Nose: Nose normal.     Mouth/Throat:     Mouth: Mucous membranes are moist.  Eyes:     General: No scleral icterus.    Conjunctiva/sclera: Conjunctivae normal.  Cardiovascular:     Rate and Rhythm: Normal rate and regular rhythm.     Heart sounds: Murmur heard.     Systolic murmur is present with a grade of 1/6.     No diastolic murmur is present.     No gallop.  Pulmonary:     Effort: Pulmonary effort is normal.     Breath sounds: No stridor. No wheezing, rhonchi or rales.  Abdominal:     General: Abdomen is flat.     Palpations: There is no mass.     Tenderness: There is no abdominal tenderness. There is no guarding.     Hernia: No hernia is present.  Musculoskeletal:        General: Normal range of motion.     Cervical back: Neck supple.     Right lower leg: No edema.     Left lower leg: No edema.  Lymphadenopathy:     Cervical: No cervical adenopathy.  Skin:    General: Skin is warm and dry.  Neurological:     General: No focal deficit present.     Mental Status: He is alert.  Psychiatric:        Mood and Affect: Mood normal.        Behavior: Behavior normal.     Lab Results  Component Value Date   WBC 6.4 10/29/2022   HGB 12.5 (L) 10/29/2022   HCT 36.4 (L) 10/29/2022   PLT 231.0 10/29/2022   GLUCOSE 102 (H) 10/29/2022   CHOL 165 10/29/2022   TRIG 127.0 10/29/2022   HDL 42.80 10/29/2022   LDLCALC 97 10/29/2022   ALT 18 10/29/2022   AST 20 10/29/2022   NA 140 10/29/2022   K 4.4 10/29/2022   CL 110 10/29/2022   CREATININE 1.60 (H) 10/29/2022   BUN 28 (H) 10/29/2022   CO2 24 10/29/2022   TSH 3.01 10/29/2022   PSA 2.55 08/21/2015   INR  1.0 05/14/2021   HGBA1C 6.6 (H) 10/29/2022   MICROALBUR 10.0 (H) 10/17/2020    DG Chest 2 View  Result Date: 02/28/2022 CLINICAL DATA:  Cough EXAM: CHEST - 2 VIEW COMPARISON:  Chest radiograph Nov 12, 2017 FINDINGS: The heart size and mediastinal contours are within normal limits. No focal consolidation. No pleural effusion. No pneumothorax. Thoracic spondylosis. IMPRESSION: No acute cardiopulmonary disease. Electronically Signed  By: Maudry Mayhew M.D.   On: 02/28/2022 10:01    Assessment & Plan:   Atrial fibrillation by electrocardiogram [facility])- He has good rate, rhythm control.  Stage 3b chronic kidney disease (HCC)- His renal function has been stable.  Primary hypertension- His blood pressure is well-controlled.  Type II diabetes mellitus with manifestations (HCC)- His blood sugar is adequately well-controlled.     Follow-up: No follow-ups on file.  Sanda Linger, MD

## 2022-12-31 ENCOUNTER — Telehealth: Payer: Self-pay

## 2022-12-31 ENCOUNTER — Other Ambulatory Visit (HOSPITAL_COMMUNITY): Payer: Self-pay

## 2022-12-31 NOTE — Progress Notes (Signed)
   12/31/2022  Patient ID: Dale Anderson, male   DOB: 20-Sep-1929, 89 y.o.   MRN: [id number]  Patient outreach to schedule telephone visit for medication review in response to clinic routed request from PCP, Dr. Yetta Barre.  Wife answered and informed me that patient was at work, but she took down my direct number for [name] to return my call tomorrow.  If I do not hear from him by next week, I will call again.  Lenna Gilford, PharmD, DPLA

## 2023-01-12 ENCOUNTER — Other Ambulatory Visit: Payer: PPO

## 2023-01-12 NOTE — Progress Notes (Signed)
   01/12/2023  Patient ID: Dale Anderson, male   DOB: 05-27-1930, 89 y.o.   MRN: [id number]  Outreach attempt for scheduled telephone visit for medication review unsuccessful.  Called number patient requested that I use to contact him for appointment [phone]) [phone] and left HIPAA compliant voicemail with my direct number for him to call back. I will try to call again the end of this week if I don't hear back from him.  Lenna Gilford, PharmD, DPLA

## 2023-01-13 ENCOUNTER — Ambulatory Visit (INDEPENDENT_AMBULATORY_CARE_PROVIDER_SITE_OTHER): Payer: PPO

## 2023-01-13 VITALS — Ht 71.0 in | Wt 160.0 lb

## 2023-01-13 DIAGNOSIS — Z0000 Encounter for general adult medical examination without abnormal findings: Secondary | ICD-10-CM

## 2023-01-13 NOTE — Progress Notes (Signed)
Subjective:   Dale Anderson is a 89 y.o. male who presents for Medicare Annual/Subsequent preventive examination.  Visit Complete: Virtual  I connected with  Jimi Schappert [place] on 01/13/23 by a audio enabled telemedicine application and verified that I am speaking with the correct person using two identifiers.  Patient Location: Home  Provider Location: Office/Clinic  I discussed the limitations of evaluation and management by telemedicine. The patient expressed understanding and agreed to proceed.  Per patient no change in vitals since last visit; unable to obtain new vitals due to this being a telehealth visit.  Patient was unable to self-report vital signs via telehealth due to a lack of equipment at home.   Review of Systems     Cardiac Risk Factors include: advanced age (>19men, >4 women);dyslipidemia;hypertension;male gender;family history of premature cardiovascular disease     Objective:    Today's Vitals   01/13/23 1440  Weight: 160 lb (72.6 kg)  Height: 5\' 11"  (1.803 m)  PainSc: 0-No pain   Body mass index is 22.32 kg/m.     01/13/2023    2:44 PM 10/11/2021    3:41 PM 01/23/2021    8:35 PM 08/28/2020   11:35 AM 03/14/2019   11:05 AM 02/10/2019    8:21 AM 05/11/2017    1:53 PM  Advanced Directives  Does Patient Have a Medical Advance Directive? Yes No No No No No No  Type of Estate agent of [place];Living will        Copy of Healthcare Power of Attorney in Chart? No - copy requested        Would patient like information on creating a medical advance directive?  No - Patient declined No - Patient declined Yes (MAU/Ambulatory/Procedural Areas - Information given)  Yes (MAU/Ambulatory/Procedural Areas - Information given) No - Patient declined    Current Medications (verified) Outpatient Encounter Medications as of 01/13/2023  Medication Sig   ALPRAZolam (XANAX) 0.5 MG tablet TAKE 1 TABLET BY MOUTH DAILY AS NEEDED FOR ANXIETY (Patient  taking differently: Take 0.5 mg by mouth daily as needed for anxiety.)   apixaban (ELIQUIS) 2.5 MG TABS tablet Take 1 tablet (2.5 mg total) by mouth 2 (two) times daily.   atorvastatin (LIPITOR) 20 MG tablet Take 1 tablet (20 mg total) by mouth daily.   blood glucose meter kit and supplies KIT Use to test blood sugar once daily. DX: E11.9   Blood Glucose Monitoring Suppl (FREESTYLE LITE) DEVI    Cholecalciferol (VITAMIN D-3) 25 MCG (1000 UT) CAPS Take 1,000 Units by mouth daily.   Continuous Glucose Receiver (FREESTYLE LIBRE 2 READER) DEVI USE AS DIRECTED   Continuous Glucose Sensor (FREESTYLE LIBRE 2 SENSOR) MISC Use as directed.   glucose blood (FREESTYLE LITE) test strip USE TO CHECK BLOOD SUGAR ONCE DAILY   insulin glargine-yfgn (SEMGLEE, YFGN,) 100 UNIT/ML Pen Inject 10 units into the skin daily.   L-Methylfolate-Algae-B12-B6 3-90.314-2-35 MG CAPS Take 1 capsule by mouth 2 (two) times daily.   Lancets MISC Use to test blood sugar once daily. DX E11.09   metoprolol succinate (TOPROL-XL) 25 MG 24 hr tablet Take 1 tablet (25 mg total) by mouth daily.   Multiple Vitamins-Minerals (CENTRUM SILVER PO) Take 1 tablet by mouth daily with breakfast.   pantoprazole (PROTONIX) 40 MG tablet Take 1 tablet (40 mg total) by mouth daily.   Semaglutide, 1 MG/DOSE, (OZEMPIC, 1 MG/DOSE,) 4 MG/3ML SOPN Inject 1 mg into the skin once a week.   tamsulosin (FLOMAX)  0.4 MG CAPS capsule Take 1 capsule (0.4 mg total) by mouth daily.   [DISCONTINUED] telmisartan (MICARDIS) 40 MG tablet TAKE 1 TABLET BY MOUTH DAILY.   No facility-administered encounter medications on file as of 01/13/2023.    Allergies (verified) Lisinopril   History: Past Medical History:  Diagnosis Date   Adhesive capsulitis of right shoulder    Anxiety    Diabetes mellitus (HCC)    DJD (degenerative joint disease)    Dysrhythmia    Atrial fib   GERD (gastroesophageal reflux disease)    Hemorrhoid    History of shingles     Hyperlipidemia    Hypertension    Nodular prostate without urinary obstruction    Recurrent bronchiectasis (HCC)    Tear of right rotator cuff    Past Surgical History:  Procedure Laterality Date   COLONOSCOPY  1998   HAND RECONSTRUCTION     right   INGUINAL HERNIA REPAIR  2006   Right.  Dr. Lurene Shadow   LUMBAR LAMINECTOMY/DECOMPRESSION MICRODISCECTOMY Left 05/27/2021   Procedure: MICRODISCECTOMY LUMBAR FIVE-SACRAL ONE USING MET-RX;  Surgeon: Dawley, Alan Mulder, DO;  Location: [facility] OR;  Service: Neurosurgery;  Laterality: Left;   ROTATOR CUFF REPAIR  1995   Left.  Dr. Meade Maw   SHOULDER ARTHROSCOPY WITH ROTATOR CUFF REPAIR AND SUBACROMIAL DECOMPRESSION  05/25/2012   Procedure: SHOULDER ARTHROSCOPY WITH ROTATOR CUFF REPAIR AND SUBACROMIAL DECOMPRESSION;  Surgeon: Nilda Simmer, MD;  Location: [facility];  Service: Orthopedics;  Laterality: Right;  Right Shoulder Arthroscopy shoulder decompression subacromial partial acromioplasty with coracoaromial release, arthroscopy shoulder distal claviculectomy, arthroscopy shoulder with rotator cuff repai   Family History  Problem Relation Age of Onset   Heart disease Father        MI at age 45   Emphysema Other        cousin   Colon cancer Neg Hx    Social History   Socioeconomic History   Marital status: Married    Spouse name: linda Mclane   Number of children: Not on file   Years of education: Not on file   Highest education level: Not on file  Occupational History   Occupation: works for security full time    Employer: [employer]  Tobacco Use   Smoking status: Never   Smokeless tobacco: Never  Vaping Use   Vaping status: Never Used  Substance and Sexual Activity   Alcohol use: No   Drug use: No   Sexual activity: Yes  Other Topics Concern   Not on file  Social History Narrative   One story home   Right handed   Works for security at [employer]       One son   Social Determinants of Health   Financial  Resource Strain: Low Risk  (01/13/2023)   Overall Financial Resource Strain (CARDIA)    Difficulty of Paying Living Expenses: Not hard at all  Food Insecurity: No Food Insecurity (01/13/2023)   Hunger Vital Sign    Worried About Running Out of Food in the Last Year: Never true    Ran Out of Food in the Last Year: Never true  Transportation Needs: No Transportation Needs (01/13/2023)   PRAPARE - Administrator, Civil Service (Medical): No    Lack of Transportation (Non-Medical): No  Physical Activity: Sufficiently Active (01/13/2023)   Exercise Vital Sign    Days of Exercise per Week: 7 days    Minutes of Exercise per Session: 30 min  Stress: No Stress Concern Present (01/13/2023)   [employer] of Occupational Health - Occupational Stress Questionnaire    Feeling of Stress : Not at all  Social Connections: Socially Integrated (01/13/2023)   Social Connection and Isolation Panel [NHANES]    Frequency of Communication with Friends and Family: More than three times a week    Frequency of Social Gatherings with Friends and Family: More than three times a week    Attends Religious Services: More than 4 times per year    Active Member of [employer] or Organizations: Yes    Attends Engineer, structural: More than 4 times per year    Marital Status: Married    Tobacco Counseling Counseling given: Not Answered   Clinical Intake:  Pre-visit preparation completed: Yes  Pain : No/denies pain Pain Score: 0-No pain     BMI - recorded: 22.32 Nutritional Status: BMI of 19-24  Normal Nutritional Risks: None Diabetes: Yes CBG done?: No Did pt. bring in CBG monitor from home?: No  How often do you need to have someone help you when you read instructions, pamphlets, or other written materials from your doctor or pharmacy?: 1 - Never What is the last grade level you completed in school?: HSG  Interpreter Needed?: No  Information entered by :: Shauna Bodkins N. Darald Uzzle,  LPN.   Activities of Daily Living    01/13/2023    2:45 PM  In your present state of health, do you have any difficulty performing the following activities:  Hearing? 0  Vision? 0  Difficulty concentrating or making decisions? 0  Walking or climbing stairs? 0  Dressing or bathing? 0  Doing errands, shopping? 0  Preparing Food and eating ? N  Using the Toilet? N  In the past six months, have you accidently leaked urine? N  Do you have problems with loss of bowel control? N  Managing your Medications? N  Managing your Finances? N  Housekeeping or managing your Housekeeping? N    Patient Care Team: Etta Grandchild, MD as PCP - General (Internal Medicine) Lyn Records, MD (Inactive) as PCP - Cardiology (Cardiology) Glendale Chard, DO as Consulting Physician (Neurology) Kathyrn Sheriff, [facility] (Inactive) as Pharmacist (Pharmacist)  Indicate any recent Medical Services you may have received from other than [facility] providers in the past year (date may be approximate).     Assessment:   This is a routine wellness examination for [place].  Hearing/Vision screen Hearing Screening - Comments:: Denies hearing difficulties; no hearing aids.   Vision Screening - Comments:: Wears rx glasses - up to date with routine eye exams with Manning Charity, OD.   Dietary issues and exercise activities discussed:     Goals Addressed             This Visit's Progress    My goal for 2024 is to continue to work until age 73.        Depression Screen    01/13/2023    2:44 PM 12/30/2022    1:31 PM 06/25/2021    9:00 AM 02/07/2021    9:15 AM 10/17/2020    3:35 PM 08/13/2020    9:20 AM 07/17/2020    1:38 PM  PHQ 2/9 Scores  PHQ - 2 Score 0 0 0 0 0 0 0  PHQ- 9 Score 0 0         Fall Risk    01/13/2023    2:44 PM 12/30/2022    1:31 PM 08/19/2021  9:00 AM 06/25/2021    9:00 AM 10/17/2020    3:35 PM  Fall Risk   Falls in the past year? 0 0 1 1 1   Comment   Today, spouse Bonita Quin reports one fall  without injury "sometime in December or January;" denies new/ recent falls, does not use assistive devices denies new / recent falls since recent back surgery   Number falls in past yr: 0 0 0 0 0  Injury with Fall? 0 0 0 0 1  Risk for fall due to : No Fall Risks No Fall Risks Orthopedic patient;History of fall(s);Medication side effect Orthopedic patient;History of fall(s);Medication side effect   Follow up Falls prevention discussed Falls evaluation completed Falls prevention discussed Falls prevention discussed     MEDICARE RISK AT HOME:  Medicare Risk at Home - 01/13/23 1453     Any stairs in or around the home? No    If so, are there any without handrails? No    Home free of loose throw rugs in walkways, pet beds, electrical cords, etc? Yes    Adequate lighting in your home to reduce risk of falls? Yes    Life alert? No    Use of a cane, walker or w/c? No    Grab bars in the bathroom? Yes    Shower chair or bench in shower? No    Elevated toilet seat or a handicapped toilet? Yes             TIMED UP AND GO:  Was the test performed?  No    Cognitive Function:        01/13/2023    2:48 PM  6CIT Screen  What Year? 0 points  What month? 0 points  What time? 0 points  Count back from 20 0 points  Months in reverse 0 points  Repeat phrase 0 points  Total Score 0 points    Immunizations Immunization History  Administered Date(s) Administered   Fluad Quad(high Dose 65+) 02/24/2019   Influenza Split 03/05/2011, 03/23/2012   Influenza Whole 03/23/2002, 05/16/2009   Influenza, High Dose Seasonal PF 03/23/2017, 03/27/2018   Influenza-Unspecified 04/23/2006, 03/23/2008, 04/23/2009, 03/19/2014, 03/23/2016, 03/23/2020   PFIZER(Purple Top)SARS-COV-2 Vaccination 06/15/2019, 07/06/2019, 10/12/2020   Pneumococcal Conjugate-13 12/26/2013   Pneumococcal Polysaccharide-23 03/18/2006, 05/24/2018   Pneumococcal-Unspecified 07/20/2005   Tdap 04/08/2012   Zoster  Recombinant(Shingrix) 09/26/2021   Zoster, Live 08/08/2004    TDAP status: Due, Education has been provided regarding the importance of this vaccine. Advised may receive this vaccine at local pharmacy or Health Dept. Aware to provide a copy of the vaccination record if obtained from local pharmacy or Health Dept. Verbalized acceptance and understanding.  Flu Vaccine status: Up to date  Pneumococcal vaccine status: Up to date  Covid-19 vaccine status: Completed vaccines  Qualifies for Shingles Vaccine? Yes   Zostavax completed Yes   Shingrix Completed?: Yes  Screening Tests Health Maintenance  Topic Date Due   COVID-19 Vaccine (4 - 2023-24 season) 02/21/2022   DTaP/Tdap/Td (2 - Td or Tdap) 04/08/2022   OPHTHALMOLOGY EXAM  10/22/2022   Zoster Vaccines- Shingrix (2 of 2) 01/29/2023 (Originally 11/21/2021)   INFLUENZA VACCINE  01/22/2023   FOOT EXAM  08/28/2023   Medicare Annual Wellness (AWV)  01/13/2024   Pneumonia Vaccine 17+ Years old  Completed   HPV VACCINES  Aged Out    Health Maintenance  Health Maintenance Due  Topic Date Due   COVID-19 Vaccine (4 - 2023-24 season) 02/21/2022   DTaP/Tdap/Td (  2 - Td or Tdap) 04/08/2022   OPHTHALMOLOGY EXAM  10/22/2022    Colorectal cancer screening: No longer required.   Lung Cancer Screening: (Low Dose CT Chest recommended if Age 75-80 years, 20 pack-year currently smoking OR have quit w/in 15years.) does not qualify.   Lung Cancer Screening Referral: no  Additional Screening:  Hepatitis C Screening: does not qualify; Completed: no  Vision Screening: Recommended annual ophthalmology exams for early detection of glaucoma and other disorders of the eye. Is the patient up to date with their annual eye exam?  Yes  Who is the provider or what is the name of the office in which the patient attends annual eye exams? Manning Charity, OD. If pt is not established with a provider, would they like to be referred to a provider to establish  care? No .   Dental Screening: Recommended annual dental exams for proper oral hygiene  Diabetic Foot Exam: Diabetic Foot Exam: Completed 08/28/2022  Community Resource Referral / Chronic Care Management: CRR required this visit?  No   CCM required this visit?  No     Plan:     I have personally reviewed and noted the following in the patient's chart:   Medical and social history Use of alcohol, tobacco or illicit drugs  Current medications and supplements including opioid prescriptions. Patient is not currently taking opioid prescriptions. Functional ability and status Nutritional status Physical activity Advanced directives List of other physicians Hospitalizations, surgeries, and ER visits in previous 12 months Vitals Screenings to include cognitive, depression, and falls Referrals and appointments  In addition, I have reviewed and discussed with patient certain preventive protocols, quality metrics, and best practice recommendations. A written personalized care plan for preventive services as well as general preventive health recommendations were provided to patient.     Mickeal Needy, LPN   [id number]   After Visit Summary: (Mail) Due to this being a telephonic visit, the after visit summary with patients personalized plan was offered to patient via mail   Nurse Notes: Normal cognitive status assessed by direct observation via telephone conversation by this Nurse Health Advisor. No abnormalities found.

## 2023-01-13 NOTE — Patient Instructions (Addendum)
[name] , Thank you for taking time to come for your Medicare Wellness Visit. I appreciate your ongoing commitment to your health goals. Please review the following plan we discussed and let me know if I can assist you in the future.   These are the goals we discussed:  Goals      My goal for 2024 is to continue to work until age 89.        This is a list of the screening recommended for you and due dates:  Health Maintenance  Topic Date Due   COVID-19 Vaccine (4 - 2023-24 season) 02/21/2022   DTaP/Tdap/Td vaccine (2 - Td or Tdap) 04/08/2022   Eye exam for diabetics  10/22/2022   Zoster (Shingles) Vaccine (2 of 2) 01/29/2023*   Flu Shot  01/22/2023   Complete foot exam   08/28/2023   Medicare Annual Wellness Visit  01/13/2024   Pneumonia Vaccine  Completed   HPV Vaccine  Aged Out  *Topic was postponed. The date shown is not the original due date.    Advanced directives: Yes  Conditions/risks identified: No  Next appointment: It was nice speaking with you today!  Please follow up in one year for your annual wellness visit via telephone call with Nurse Percell Miller on 01/14/2024 at 3:00 p.m.  If you need to cancel or reschedule please call [phone].  Preventive Care 35 Years and Older, Male  Preventive care refers to lifestyle choices and visits with your health care provider that can promote health and wellness. What does preventive care include? A yearly physical exam. This is also called an annual well check. Dental exams once or twice a year. Routine eye exams. Ask your health care provider how often you should have your eyes checked. Personal lifestyle choices, including: Daily care of your teeth and gums. Regular physical activity. Eating a healthy diet. Avoiding tobacco and drug use. Limiting alcohol use. Practicing safe sex. Taking low doses of aspirin every day. Taking vitamin and mineral supplements as recommended by your health care provider. What happens  during an annual well check? The services and screenings done by your health care provider during your annual well check will depend on your age, overall health, lifestyle risk factors, and family history of disease. Counseling  Your health care provider may ask you questions about your: Alcohol use. Tobacco use. Drug use. Emotional well-being. Home and relationship well-being. Sexual activity. Eating habits. History of falls. Memory and ability to understand (cognition). Work and work Astronomer. Screening  You may have the following tests or measurements: Height, weight, and BMI. Blood pressure. Lipid and cholesterol levels. These may be checked every 5 years, or more frequently if you are over 56 years old. Skin check. Lung cancer screening. You may have this screening every year starting at age 34 if you have a 30-pack-year history of smoking and currently smoke or have quit within the past 15 years. Fecal occult blood test (FOBT) of the stool. You may have this test every year starting at age 59. Flexible sigmoidoscopy or colonoscopy. You may have a sigmoidoscopy every 5 years or a colonoscopy every 10 years starting at age 74. Prostate cancer screening. Recommendations will vary depending on your family history and other risks. Hepatitis C blood test. Hepatitis B blood test. Sexually transmitted disease (STD) testing. Diabetes screening. This is done by checking your blood sugar (glucose) after you have not eaten for a while (fasting). You may have this done every 1-3 years. Abdominal  aortic aneurysm (AAA) screening. You may need this if you are a current or former smoker. Osteoporosis. You may be screened starting at age 42 if you are at high risk. Talk with your health care provider about your test results, treatment options, and if necessary, the need for more tests. Vaccines  Your health care provider may recommend certain vaccines, such as: Influenza vaccine. This is  recommended every year. Tetanus, diphtheria, and acellular pertussis (Tdap, Td) vaccine. You may need a Td booster every 10 years. Zoster vaccine. You may need this after age 56. Pneumococcal 13-valent conjugate (PCV13) vaccine. One dose is recommended after age 28. Pneumococcal polysaccharide (PPSV23) vaccine. One dose is recommended after age 24. Talk to your health care provider about which screenings and vaccines you need and how often you need them. This information is not intended to replace advice given to you by your health care provider. Make sure you discuss any questions you have with your health care provider. Document Released: 07/06/2015 Document Revised: 02/27/2016 Document Reviewed: 04/10/2015 Elsevier Interactive Patient Education  2017 [employer].  Fall Prevention in the Home Falls can cause injuries. They can happen to people of all ages. There are many things you can do to make your home safe and to help prevent falls. What can I do on the outside of my home? Regularly fix the edges of walkways and driveways and fix any cracks. Remove anything that might make you trip as you walk through a door, such as a raised step or threshold. Trim any bushes or trees on the path to your home. Use bright outdoor lighting. Clear any walking paths of anything that might make someone trip, such as rocks or tools. Regularly check to see if handrails are loose or broken. Make sure that both sides of any steps have handrails. Any raised decks and porches should have guardrails on the edges. Have any leaves, snow, or ice cleared regularly. Use sand or salt on walking paths during winter. Clean up any spills in your garage right away. This includes oil or grease spills. What can I do in the bathroom? Use night lights. Install grab bars by the toilet and in the tub and shower. Do not use towel bars as grab bars. Use non-skid mats or decals in the tub or shower. If you need to sit down in  the shower, use a plastic, non-slip stool. Keep the floor dry. Clean up any water that spills on the floor as soon as it happens. Remove soap buildup in the tub or shower regularly. Attach bath mats securely with double-sided non-slip rug tape. Do not have throw rugs and other things on the floor that can make you trip. What can I do in the bedroom? Use night lights. Make sure that you have a light by your bed that is easy to reach. Do not use any sheets or blankets that are too big for your bed. They should not hang down onto the floor. Have a firm chair that has side arms. You can use this for support while you get dressed. Do not have throw rugs and other things on the floor that can make you trip. What can I do in the kitchen? Clean up any spills right away. Avoid walking on wet floors. Keep items that you use a lot in easy-to-reach places. If you need to reach something above you, use a strong step stool that has a grab bar. Keep electrical cords out of the way. Do not use  floor polish or wax that makes floors slippery. If you must use wax, use non-skid floor wax. Do not have throw rugs and other things on the floor that can make you trip. What can I do with my stairs? Do not leave any items on the stairs. Make sure that there are handrails on both sides of the stairs and use them. Fix handrails that are broken or loose. Make sure that handrails are as long as the stairways. Check any carpeting to make sure that it is firmly attached to the stairs. Fix any carpet that is loose or worn. Avoid having throw rugs at the top or bottom of the stairs. If you do have throw rugs, attach them to the floor with carpet tape. Make sure that you have a light switch at the top of the stairs and the bottom of the stairs. If you do not have them, ask someone to add them for you. What else can I do to help prevent falls? Wear shoes that: Do not have high heels. Have rubber bottoms. Are comfortable  and fit you well. Are closed at the toe. Do not wear sandals. If you use a stepladder: Make sure that it is fully opened. Do not climb a closed stepladder. Make sure that both sides of the stepladder are locked into place. Ask someone to hold it for you, if possible. Clearly mark and make sure that you can see: Any grab bars or handrails. First and last steps. Where the edge of each step is. Use tools that help you move around (mobility aids) if they are needed. These include: Canes. Walkers. Scooters. Crutches. Turn on the lights when you go into a dark area. Replace any light bulbs as soon as they burn out. Set up your furniture so you have a clear path. Avoid moving your furniture around. If any of your floors are uneven, fix them. If there are any pets around you, be aware of where they are. Review your medicines with your doctor. Some medicines can make you feel dizzy. This can increase your chance of falling. Ask your doctor what other things that you can do to help prevent falls. This information is not intended to replace advice given to you by your health care provider. Make sure you discuss any questions you have with your health care provider. Document Released: 04/05/2009 Document Revised: 11/15/2015 Document Reviewed: 07/14/2014 Elsevier Interactive Patient Education  2017 [employer].

## 2023-01-14 ENCOUNTER — Other Ambulatory Visit (HOSPITAL_COMMUNITY): Payer: Self-pay

## 2023-01-20 ENCOUNTER — Telehealth: Payer: Self-pay

## 2023-01-20 NOTE — Progress Notes (Signed)
   01/20/2023  Patient ID: Dale Anderson, male   DOB: 13-Aug-1929, 89 y.o.   MRN: [id number]  Patient outreach to reschedule recently missed telephone visit.  Appointment scheduled for Tuesday 8/6 at 1:30pm.  Lenna Gilford, PharmD, DPLA

## 2023-01-21 ENCOUNTER — Other Ambulatory Visit (HOSPITAL_COMMUNITY): Payer: Self-pay

## 2023-01-21 ENCOUNTER — Other Ambulatory Visit: Payer: Self-pay

## 2023-01-23 IMAGING — RF DG LUMBAR SPINE 1V
1 series · 1 of 1 positions shown · non-contrast
Comparison: MRI 05/13/2021

CLINICAL DATA: Micro discectomy

EXAM:
LUMBAR SPINE - 1 VIEW

[Series 1: run · 1 of 1 slices shown]
[im 1/1]
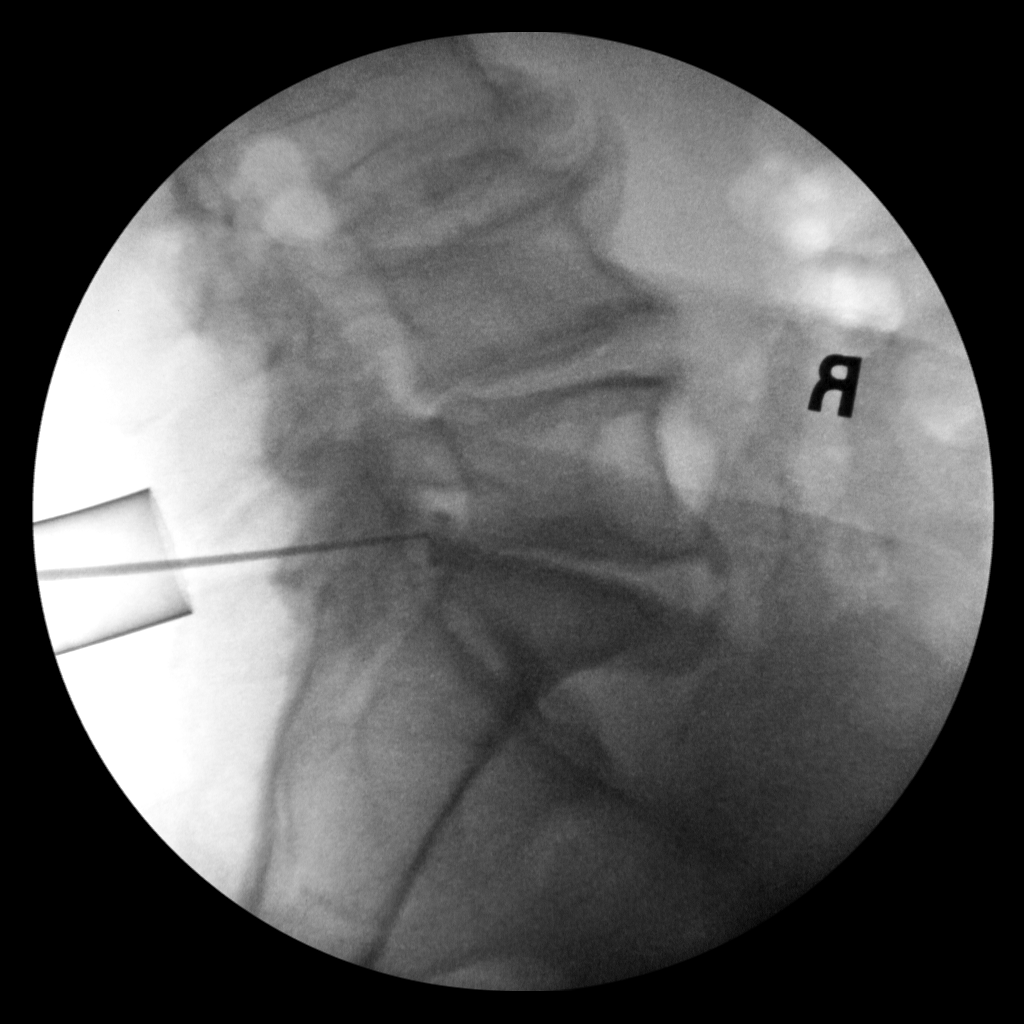

[1 of 1 positions shown; findings below may reference images not displayed]

FINDINGS: Single low resolution intraoperative spot view of the lumbar spine.
Total fluoroscopy time was 25 seconds. The image demonstrates linear
localizing device posterior to the L5-S1 disc space.
IMPRESSION: Intraoperative fluoroscopic assistance provided during lumbar spine
surgery

## 2023-01-27 ENCOUNTER — Other Ambulatory Visit (HOSPITAL_COMMUNITY): Payer: Self-pay

## 2023-01-27 ENCOUNTER — Other Ambulatory Visit: Payer: PPO

## 2023-01-27 NOTE — Progress Notes (Signed)
01/27/2023 Name: Dale Anderson MRN: [id number] DOB: 30-Jun-1929  Chief Complaint  Patient presents with   Medication Management   Dale Anderson Villafranca is a 89 y.o. year old male who presented for a telephone visit.   They were referred to the pharmacist by their PCP for assistance in managing diabetes and hypertension.   Subjective:  Care Team: Primary Care Provider: Etta Grandchild, MD   Medication Access/Adherence  Current Pharmacy:  [facility] - [facility] [address] [place] [address] Phone: [phone] Fax: [phone]  [id number] - [place], [place] - [address] AT [facility] OF [address] [place] [address] Phone: [phone] Fax: [phone]  Patient reports affordability concerns with their medications: No  Patient reports access/transportation concerns to their pharmacy: No  Patient reports adherence concerns with their medications:  Yes  Has not had Ozempic 1mg  in 2-3 months, and is also not taking metoprolol xl 25mg    Diabetes: Current medications: Ozempic 1mg  weekly, Semglee 10 units daily -Current glucose readings: FBG around 90, post-prandial can be as high as 200 per patient -Using Freestyle Libre2 for CGM -Patient denies hypoglycemic s/sx including dizziness, shakiness, sweating.  -Patient denies hyperglycemic symptoms including polyuria, polydipsia, polyphagia, nocturia, neuropathy, blurred vision. -Mr. Mchale has only been using Semglee for diabetes control for the past several months, because he states the pharmacy had not been able to get his Ozempic in  Hypertension: Current medications: metoprolol xl 25mg  daily  Medications previously tried: lisinopril caused cough -Patient has a validated, automated, upper arm home BP cuff -Current blood pressure readings readings: unable to provide, but last clinic reading 146/76 -Patient denies hypotensive s/sx  including dizziness, lightheadedness.  -Patient denies hypertensive symptoms including headache, chest pain, shortness of breath -Patient states he does not have metoprolol xl 25mg  at home and is not sure of the last time he was taking this regularly  Objective: Lab Results  Component Value Date   HGBA1C 6.6 (H) 10/29/2022   Lab Results  Component Value Date   CREATININE 1.60 (H) 10/29/2022   BUN 28 (H) 10/29/2022   NA 140 10/29/2022   K 4.4 10/29/2022   CL 110 10/29/2022   CO2 24 10/29/2022   Lab Results  Component Value Date   CHOL 165 10/29/2022   HDL 42.80 10/29/2022   LDLCALC 97 10/29/2022   TRIG 127.0 10/29/2022   CHOLHDL 4 10/29/2022   Medications Reviewed Today     Reviewed by Lenna Gilford, RPH (Pharmacist) on 01/27/23 at 1354  Med List Status: <None>   Medication Order Taking? Sig Documenting Provider Last Dose Status Informant  ALPRAZolam (XANAX) 0.5 MG tablet [id number] No TAKE 1 TABLET BY MOUTH DAILY AS NEEDED FOR ANXIETY  Patient taking differently: Take 0.5 mg by mouth daily as needed for anxiety.   Etta Grandchild, MD Unknown Active Self  apixaban Everlene Balls) 2.5 MG TABS tablet [id number] Yes Take 1 tablet (2.5 mg total) by mouth 2 (two) times daily. Etta Grandchild, MD Taking Active   atorvastatin (LIPITOR) 20 MG tablet [id number] Yes Take 1 tablet (20 mg total) by mouth daily. Etta Grandchild, MD Taking Active   blood glucose meter kit and supplies KIT [id number] No Use to test blood sugar once daily. DX: E11.9  Patient not taking: Reported on 01/27/2023   Etta Grandchild, MD Not Taking Active Self  Blood Glucose Monitoring Suppl (FREESTYLE LITE) DEVI [id number] No  Patient not taking: Reported on 01/27/2023   [provider] Not Taking Active Self  Cholecalciferol (VITAMIN D-3) 25 MCG (1000 UT) CAPS [id number] Yes Take 2,000 Units by mouth daily. [provider] Taking Active Self  Continuous Glucose Receiver (FREESTYLE LIBRE 2 READER) DEVI  [id number] Yes USE AS DIRECTED Etta Grandchild, MD Taking Active   Continuous Glucose Sensor (FREESTYLE LIBRE 2 SENSOR) [place] [id number] Yes Use as directed. Etta Grandchild, MD Taking Active   glucose blood (FREESTYLE LITE) test strip [id number] No USE TO CHECK BLOOD SUGAR ONCE DAILY  Patient not taking: Reported on 01/27/2023   Etta Grandchild, MD Not Taking Active Self  insulin glargine-yfgn (SEMGLEE, YFGN,) 100 UNIT/ML Pen [id number] Yes Inject 10 units into the skin daily. Etta Grandchild, MD Taking Active Self           Med Note Lynnette Caffey May 27, 2021  2:30 PM) Patient states he took 10 units   L-Methylfolate-Algae-B12-B6 3-90.314-2-35 MG CAPS [id number] Yes Take 1 capsule by mouth 2 (two) times daily. Etta Grandchild, MD Taking Active   Lancets MISC [id number] No Use to test blood sugar once daily. DX E11.09  Patient not taking: Reported on 01/27/2023   Etta Grandchild, MD Not Taking Active Self  metoprolol succinate (TOPROL-XL) 25 MG 24 hr tablet [id number] Yes Take 1 tablet (25 mg total) by mouth daily. Lyn Records, MD Taking Active   Multiple Vitamins-Minerals (CENTRUM SILVER PO) [id number] No Take 1 tablet by mouth daily with breakfast. [provider] Unknown Active Self  pantoprazole (PROTONIX) 40 MG tablet [id number] Yes Take 1 tablet (40 mg total) by mouth daily. Etta Grandchild, MD Taking Active   Semaglutide, 1 MG/DOSE, (OZEMPIC, 1 MG/DOSE,) 4 MG/3ML SOPN [id number] No Inject 1 mg into the skin once a week.  Patient not taking: Reported on 01/27/2023   Etta Grandchild, MD Not Taking Active   tamsulosin [facility]) 0.4 MG CAPS capsule [id number] Yes Take 1 capsule (0.4 mg total) by mouth daily.  Taking Active     Discontinued 07/17/20 1749 (Discontinued by provider)            Assessment/Plan:   Diabetes: - Currently controlled - Contacted [facility], and they are able to consistently get all doses of Ozempic in stock - Patient has not filled  1mg  dose since February per pharmacy - Recommend restarting Ozempic at 0.25mg  weekly since it has been a while since he has taken the medication.  We can attempt a quick titration up, though, if her tolerates the medication well (0.25mg  x2 weeks, 0.5mg  x2 weeks, then 1mg  thereafter).   Prescription is pending for Dr. Yetta Barre to sign. - I recommend holding Semglee, as it is unlikely the patient will need to continue insulin along with Ozempic to keep A1c  and BG at goal   Hypertension: - Currently uncontrolled - Recommend restarting metoprolol therapy- pending metoprolol xl 25mg  daily refill for Dr. Yetta Barre to sign if in agreement - Recommend that patient check BP at least 2-3x/wk  Follow Up Plan: Consulting Dr. Yetta Barre and will inform patient of plan and schedule follow-up telephone visit for 2 weeks from now  Lenna Gilford, PharmD, DPLA

## 2023-01-30 ENCOUNTER — Other Ambulatory Visit (HOSPITAL_COMMUNITY): Payer: Self-pay

## 2023-01-30 ENCOUNTER — Telehealth: Payer: Self-pay

## 2023-01-30 MED ORDER — OZEMPIC (0.25 OR 0.5 MG/DOSE) 2 MG/3ML ~~LOC~~ SOPN
PEN_INJECTOR | SUBCUTANEOUS | 0 refills | Status: AC
  Filled 2023-01-30: qty 3, 35d supply, fill #0

## 2023-01-30 MED ORDER — METOPROLOL SUCCINATE ER 25 MG PO TB24
25.0000 mg | ORAL_TABLET | Freq: Every day | ORAL | 0 refills | Status: DC
  Filled 2023-01-30: qty 90, 90d supply, fill #0

## 2023-01-30 NOTE — Progress Notes (Unsigned)
   01/30/2023  Patient ID: Dale Anderson, male   DOB: [birth year], 89 y.o.   MRN: [id number]  Contacted patient to make him aware Dr. Yetta Barre sent refills in for his metoprolol.  I also informed him that his pharmacy is able to fill all strengths of Ozempic at this time, and we would like to start him back at the 0.25mg  weekly dose.  We have a telephone follow-up in two weeks, but I did instruct him to stop his insulin if he has any hypoglycemia.  Otherwise, we will plan to stop it when we increase to Ozempic 0.5mg  weekly.  Lenna Gilford, PharmD, DPLA

## 2023-02-04 ENCOUNTER — Other Ambulatory Visit (HOSPITAL_COMMUNITY): Payer: Self-pay

## 2023-02-13 ENCOUNTER — Other Ambulatory Visit: Payer: PPO

## 2023-02-13 NOTE — Progress Notes (Signed)
   02/13/2023  Patient ID: Dale Anderson, male   DOB: Sep 18, 1929, 89 y.o.   MRN: [id number]  S/O Telephone follow-up to see how patient is doing on recently restarted Ozempic  Diabetes Management Plan -Current medications:  Ozempic 0.25mg  weekly, Semglee 10 units nightly  -Patient recently restarted Ozempic, and has had approximately 2 doses of 0.25mg .  He endorses tolerating the medication well, with no adverse side effects -FBG 95-110 -Does not endorse any s/sx of hypoglycemia -Using Freestyle Libre 2 for CGM -The prescription did cost almost $200, which is not affordable long-term for the patient  A/P  Diabetes Management Plan -After 2 more doses of Ozempic 0.25mg , increase to 0.5mg  weekly; and decrease Semglee to 5 units nightly -Patient would qualify for Novo PAP for Ozempic; coordinating with medication assistance team to initiate application process  Follow-up:  Scheduled in 4 weeks  Lenna Gilford, PharmD, DPLA

## 2023-02-18 ENCOUNTER — Telehealth: Payer: Self-pay

## 2023-02-18 NOTE — Telephone Encounter (Signed)
Submitted application for OZEMPIC to [employer] for patient assistance via online portal.   Phone: [phone]  

## 2023-02-18 NOTE — Telephone Encounter (Signed)
-----   Message from Lenna Gilford sent at 02/13/2023 11:16 AM EDT ----- Good morning,  Mr. Sigel would qualify for Novo PAP for Ozempic 0.5mg  weekly.  Would you all mind to start the application process on his behalf?  Thank you!

## 2023-03-06 ENCOUNTER — Telehealth: Payer: Self-pay | Admitting: *Deleted

## 2023-03-06 NOTE — Telephone Encounter (Signed)
Called patient and informed him that his Ozempic patient assistance medication is here in the office. Medication will be in the main fridge at the nursing station,

## 2023-03-06 NOTE — Telephone Encounter (Signed)
A representative from [employer] said patient's application has been approved and is valid until 02/14/2024.

## 2023-03-09 NOTE — Telephone Encounter (Signed)
Pt has picked up meds

## 2023-03-12 ENCOUNTER — Other Ambulatory Visit: Payer: PPO

## 2023-03-17 ENCOUNTER — Other Ambulatory Visit: Payer: PPO

## 2023-03-17 DIAGNOSIS — E118 Type 2 diabetes mellitus with unspecified complications: Secondary | ICD-10-CM

## 2023-03-17 NOTE — Progress Notes (Signed)
03/17/2023 Name: Dale Anderson MRN: [id number] DOB: 12-Aug-1929  No chief complaint on file.   Dale Anderson is a 89 y.o. year old male who presented for a telephone follow up visit.    Subjective:  Care Team: Primary Care Provider: Etta Grandchild, MD ; Next Scheduled Visit: 01/14/2024  Medication Access/Adherence  Current Pharmacy:  [facility] - [facility] [address] [place] [address] Phone: [phone] Fax: [phone]  [id number] - [place], [place] - [address] AT [facility] OF [address] [place] [address] Phone: [phone] Fax: [phone]   Patient reports affordability concerns with their medications: No  Patient reports access/transportation concerns to their pharmacy: No  Patient reports adherence concerns with their medications:  No     Diabetes:  Current medications: Ozempic 0.5 mg SQ weekly, Semglee 10 units once daily Medications tried in the past:   Current glucose readings: pt reports occasionally up to 200 after eating but generally is around 100-150s. Does note he has had some readings <100 Using Freestyle Libre 2 meter He was unable to look up average on the [place] reader  Patient reports hypoglycemic s/sx including dizziness, shakiness, sweating but notes it was "awhile back"  Patient denies hyperglycemic symptoms including polyuria, polydipsia, polyphagia, nocturia, neuropathy, blurred vision.   Current physical activity: pt stays active w/ work, still work 40 hours per week  Current medication access support: Ozempic PAP w/ [employer]  Hypertension:  Current medications: metoprolol succinate 25 mg daily   Patient has a validated, automated, upper arm home BP cuff Current blood pressure readings readings: has not checked lately   Objective:  Lab Results  Component Value Date   HGBA1C 6.6 (H) 10/29/2022    Lab Results   Component Value Date   CREATININE 1.60 (H) 10/29/2022   BUN 28 (H) 10/29/2022   NA 140 10/29/2022   K 4.4 10/29/2022   CL 110 10/29/2022   CO2 24 10/29/2022    Lab Results  Component Value Date   CHOL 165 10/29/2022   HDL 42.80 10/29/2022   LDLCALC 97 10/29/2022   TRIG 127.0 10/29/2022   CHOLHDL 4 10/29/2022    Medications Reviewed Today   Medications were not reviewed in this encounter       Assessment/Plan:   Diabetes: - Currently controlled, A1c goal <8.0% - Reviewed goal is to keep BG >100. Concern for hypoglycemia risk taking Ozempic plus Semglee.  - Recommend getting updated A1c. If A1c remains <7.0%, recommend stopping Semglee and continue Ozempic.     Follow Up Plan: Telephone f/u 10/29 at 10 AM  Arbutus Leas, PharmD, BCPS [facility] [phone]

## 2023-03-17 NOTE — Patient Instructions (Signed)
It was a pleasure speaking with you today!  Diabetes: Continue Ozempic 0.5 mg once weekly and Semglee 10 units daily. Come for blood work sometime this week to check A1c. Let me know if you start to experience blood sugars below 100 often.   Blood Pressure: Continue metoprolol succinate 25 mg once daily. Check your blood pressure at home occasionally. Goal blood pressure should be in the 120-130s/80s.   I will plan to call for follow up in 1 month on 04/21/2023 at 10 AM.  Arbutus Leas, PharmD, BCPS [facility] [phone]

## 2023-03-24 ENCOUNTER — Telehealth: Payer: Self-pay | Admitting: Pharmacist

## 2023-04-01 ENCOUNTER — Other Ambulatory Visit (HOSPITAL_COMMUNITY): Payer: Self-pay

## 2023-04-01 ENCOUNTER — Other Ambulatory Visit: Payer: Self-pay | Admitting: Internal Medicine

## 2023-04-01 DIAGNOSIS — E118 Type 2 diabetes mellitus with unspecified complications: Secondary | ICD-10-CM

## 2023-04-01 MED ORDER — INSULIN GLARGINE-YFGN 100 UNIT/ML ~~LOC~~ SOPN
10.0000 [IU] | PEN_INJECTOR | Freq: Every day | SUBCUTANEOUS | 0 refills | Status: AC
Start: 2023-04-01 — End: ?
  Filled 2023-04-01: qty 9, 84d supply, fill #0

## 2023-04-02 ENCOUNTER — Other Ambulatory Visit (HOSPITAL_COMMUNITY): Payer: Self-pay

## 2023-04-02 NOTE — Progress Notes (Signed)
Pharmacy Quality Measure Review  This patient is appearing on a report for being at risk of failing the adherence measure for cholesterol (statin) medications this calendar year.   Medication: atorvastatin 20 mg Last fill date: 10/30/22 for 90 day supply  Spoke to the patient regarding atorvastatin. He thought he was taking but upon looking for the bottle, he discovered he did not have any. Pt is past adherence date, will fail metric.  Contacted pharmacy to facilitate refills.   Arbutus Leas, PharmD, BCPS [facility] [phone]

## 2023-04-03 ENCOUNTER — Other Ambulatory Visit (HOSPITAL_COMMUNITY): Payer: Self-pay

## 2023-04-21 ENCOUNTER — Telehealth: Payer: Self-pay | Admitting: Pharmacist

## 2023-04-21 ENCOUNTER — Other Ambulatory Visit: Payer: PPO

## 2023-05-05 ENCOUNTER — Other Ambulatory Visit: Payer: Self-pay | Admitting: Internal Medicine

## 2023-05-05 ENCOUNTER — Other Ambulatory Visit (HOSPITAL_COMMUNITY): Payer: Self-pay

## 2023-05-05 DIAGNOSIS — E118 Type 2 diabetes mellitus with unspecified complications: Secondary | ICD-10-CM

## 2023-05-05 DIAGNOSIS — E119 Type 2 diabetes mellitus without complications: Secondary | ICD-10-CM

## 2023-05-05 MED ORDER — FREESTYLE LIBRE 2 SENSOR MISC
5 refills | Status: DC
Start: 2023-05-05 — End: 2023-08-20
  Filled 2023-05-05: qty 2, 28d supply, fill #0
  Filled 2023-06-30: qty 2, 28d supply, fill #1
  Filled 2023-07-29: qty 2, 28d supply, fill #2

## 2023-05-09 DIAGNOSIS — M79604 Pain in right leg: Secondary | ICD-10-CM | POA: Diagnosis not present

## 2023-05-12 NOTE — Telephone Encounter (Signed)
Have been unable to reach patient for pharmacist telephone appt  Arbutus Leas, PharmD, BCPS Clinical Pharmacist [facility] [phone]

## 2023-05-28 ENCOUNTER — Telehealth: Payer: Self-pay

## 2023-05-28 NOTE — Telephone Encounter (Signed)
Medication has been received on 12/5 and placed int he fridge. Was unable to get in contact with patient, But left a very detailed message in regards to his medication being in office and ready for pick up.

## 2023-06-29 ENCOUNTER — Telehealth: Payer: Self-pay

## 2023-06-29 NOTE — Telephone Encounter (Signed)
 PAP: Patient assistance application for Ozempic  has been approved by PAP Companies: [employer] from 02/14/2023 to 02/14/2024. Medication should be delivered to PAP Delivery: Provider's office For further shipping updates, please contact [employer] at [phone] Pt ID is: NO ID

## 2023-06-30 ENCOUNTER — Other Ambulatory Visit (HOSPITAL_COMMUNITY): Payer: Self-pay

## 2023-07-29 ENCOUNTER — Other Ambulatory Visit (HOSPITAL_COMMUNITY): Payer: Self-pay

## 2023-08-08 ENCOUNTER — Encounter (HOSPITAL_COMMUNITY): Payer: Self-pay

## 2023-08-08 ENCOUNTER — Emergency Department (HOSPITAL_COMMUNITY): Payer: PPO

## 2023-08-08 ENCOUNTER — Other Ambulatory Visit: Payer: Self-pay

## 2023-08-08 ENCOUNTER — Emergency Department (HOSPITAL_COMMUNITY)
Admission: EM | Admit: 2023-08-08 | Discharge: 2023-08-08 | Disposition: A | Discharge status: Home / Self Care | Payer: PPO | Attending: Emergency Medicine | Admitting: Emergency Medicine

## 2023-08-08 DIAGNOSIS — E119 Type 2 diabetes mellitus without complications: Secondary | ICD-10-CM | POA: Diagnosis not present

## 2023-08-08 DIAGNOSIS — S0181XA Laceration without foreign body of other part of head, initial encounter: Secondary | ICD-10-CM | POA: Diagnosis not present

## 2023-08-08 DIAGNOSIS — Z79899 Other long term (current) drug therapy: Secondary | ICD-10-CM | POA: Diagnosis not present

## 2023-08-08 DIAGNOSIS — I10 Essential (primary) hypertension: Secondary | ICD-10-CM | POA: Diagnosis not present

## 2023-08-08 DIAGNOSIS — S022XXA Fracture of nasal bones, initial encounter for closed fracture: Secondary | ICD-10-CM | POA: Insufficient documentation

## 2023-08-08 DIAGNOSIS — Y92410 Unspecified street and highway as the place of occurrence of the external cause: Secondary | ICD-10-CM | POA: Diagnosis not present

## 2023-08-08 DIAGNOSIS — S0992XA Unspecified injury of nose, initial encounter: Secondary | ICD-10-CM | POA: Diagnosis present

## 2023-08-08 DIAGNOSIS — Z23 Encounter for immunization: Secondary | ICD-10-CM | POA: Insufficient documentation

## 2023-08-08 DIAGNOSIS — Z794 Long term (current) use of insulin: Secondary | ICD-10-CM | POA: Insufficient documentation

## 2023-08-08 DIAGNOSIS — S01419A Laceration without foreign body of unspecified cheek and temporomandibular area, initial encounter: Secondary | ICD-10-CM

## 2023-08-08 DIAGNOSIS — Z7901 Long term (current) use of anticoagulants: Secondary | ICD-10-CM | POA: Diagnosis not present

## 2023-08-08 MED ORDER — TETANUS-DIPHTH-ACELL PERTUSSIS 5-2.5-18.5 LF-MCG/0.5 IM SUSY
0.5000 mL | PREFILLED_SYRINGE | Freq: Once | INTRAMUSCULAR | Status: AC
  Administered 2023-08-08: 0.5 mL via INTRAMUSCULAR
  Filled 2023-08-08: qty 0.5
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
## 2023-08-08 NOTE — Progress Notes (Signed)
   08/08/23 1448  Spiritual Encounters  Type of Visit Initial;Attempt (pt unavailable)  Care provided to: Patient;Friend  Referral source Chaplain team  Reason for visit Trauma  OnCall Visit No  Advance Directives (For Healthcare)  Does Patient Have a Medical Advance Directive? Yes  Mental Health Advance Directives  Does Patient Have a Mental Health Advance Directive? No   Attempt was made for referral from Chaplain team to visit patient in ED.  Will continue to remain available as needed.
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
## 2023-08-13 ENCOUNTER — Other Ambulatory Visit (HOSPITAL_COMMUNITY): Payer: Self-pay

## 2023-08-13 ENCOUNTER — Other Ambulatory Visit: Payer: Self-pay

## 2023-08-14 ENCOUNTER — Other Ambulatory Visit: Payer: Self-pay

## 2023-08-14 ENCOUNTER — Other Ambulatory Visit: Payer: Self-pay | Admitting: Internal Medicine

## 2023-08-14 ENCOUNTER — Other Ambulatory Visit (HOSPITAL_COMMUNITY): Payer: Self-pay

## 2023-08-14 MED ORDER — METOPROLOL SUCCINATE ER 25 MG PO TB24
25.0000 mg | ORAL_TABLET | Freq: Every day | ORAL | 0 refills | Status: DC
  Filled 2023-08-14: qty 90, 90d supply, fill #0

## 2023-08-14 MED ORDER — OMRON 3 SERIES BP MONITOR DEVI
0 refills | Status: AC
  Filled 2023-08-14: qty 1, 365d supply, fill #0
  Filled 2023-08-14: qty 1, 30d supply, fill #0

## 2023-08-18 ENCOUNTER — Other Ambulatory Visit: Payer: Self-pay | Admitting: Internal Medicine

## 2023-08-18 ENCOUNTER — Other Ambulatory Visit (HOSPITAL_COMMUNITY): Payer: Self-pay

## 2023-08-18 DIAGNOSIS — K219 Gastro-esophageal reflux disease without esophagitis: Secondary | ICD-10-CM

## 2023-08-18 DIAGNOSIS — E785 Hyperlipidemia, unspecified: Secondary | ICD-10-CM

## 2023-08-20 ENCOUNTER — Ambulatory Visit (INDEPENDENT_AMBULATORY_CARE_PROVIDER_SITE_OTHER): Payer: PPO | Admitting: Family Medicine

## 2023-08-20 ENCOUNTER — Encounter: Payer: Self-pay | Admitting: Family Medicine

## 2023-08-20 ENCOUNTER — Other Ambulatory Visit (HOSPITAL_COMMUNITY): Payer: Self-pay

## 2023-08-20 ENCOUNTER — Other Ambulatory Visit: Payer: Self-pay | Admitting: Internal Medicine

## 2023-08-20 DIAGNOSIS — K219 Gastro-esophageal reflux disease without esophagitis: Secondary | ICD-10-CM

## 2023-08-20 DIAGNOSIS — I10 Essential (primary) hypertension: Secondary | ICD-10-CM

## 2023-08-20 DIAGNOSIS — Z794 Long term (current) use of insulin: Secondary | ICD-10-CM | POA: Diagnosis not present

## 2023-08-20 DIAGNOSIS — E119 Type 2 diabetes mellitus without complications: Secondary | ICD-10-CM | POA: Diagnosis not present

## 2023-08-20 DIAGNOSIS — S022XXA Fracture of nasal bones, initial encounter for closed fracture: Secondary | ICD-10-CM | POA: Diagnosis not present

## 2023-08-20 MED ORDER — FREESTYLE LIBRE 2 SENSOR MISC
5 refills | Status: DC
  Filled 2023-08-20: qty 2, 28d supply, fill #0
  Filled 2023-09-30: qty 2, 28d supply, fill #1
  Filled 2023-11-03: qty 2, 28d supply, fill #2
  Filled 2023-12-09: qty 2, 28d supply, fill #3
  Filled 2024-01-11: qty 2, 28d supply, fill #4
  Filled 2024-02-17: qty 2, 28d supply, fill #5
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
## 2023-08-20 NOTE — Patient Instructions (Addendum)
 Continue your current medications as prescribed.   Please follow up with Dr. Yetta Barre for your chronic health conditions and to check labs.

## 2023-08-24 ENCOUNTER — Other Ambulatory Visit (HOSPITAL_COMMUNITY): Payer: Self-pay

## 2023-08-24 MED ORDER — PANTOPRAZOLE SODIUM 40 MG PO TBEC
40.0000 mg | DELAYED_RELEASE_TABLET | Freq: Every day | ORAL | 0 refills | Status: DC
  Filled 2023-08-24: qty 90, 90d supply, fill #0

## 2023-08-24 MED ORDER — ATORVASTATIN CALCIUM 20 MG PO TABS
20.0000 mg | ORAL_TABLET | Freq: Every day | ORAL | 0 refills | Status: DC
  Filled 2023-08-24: qty 90, 90d supply, fill #0

## 2023-08-26 ENCOUNTER — Other Ambulatory Visit (HOSPITAL_COMMUNITY): Payer: Self-pay

## 2023-08-26 MED ORDER — FLUOCINOLONE ACETONIDE SCALP 0.01 % EX OIL
1.0000 | TOPICAL_OIL | Freq: Two times a day (BID) | CUTANEOUS | 5 refills | Status: AC
Start: 2023-08-26 — End: ?
  Filled 2023-08-26: qty 118.28, 14d supply, fill #0

## 2023-08-26 MED ORDER — CLOBETASOL PROPIONATE 0.05 % EX SHAM
1.0000 | MEDICATED_SHAMPOO | Freq: Every day | CUTANEOUS | 5 refills | Status: AC
Start: 2023-08-26 — End: ?
  Filled 2023-08-26: qty 118, 14d supply, fill #0

## 2023-09-04 ENCOUNTER — Telehealth: Payer: Self-pay

## 2023-09-04 NOTE — Telephone Encounter (Signed)
 Patients ozempic has arrived and has been placed in the fridge. I left a message for the patient to come to the office to pick it up at his earliest convenience.

## 2023-09-22 ENCOUNTER — Ambulatory Visit: Payer: PPO | Admitting: Internal Medicine

## 2023-09-24 ENCOUNTER — Ambulatory Visit: Payer: PPO | Admitting: Internal Medicine

## 2023-09-30 ENCOUNTER — Other Ambulatory Visit (HOSPITAL_COMMUNITY): Payer: Self-pay

## 2023-11-03 ENCOUNTER — Other Ambulatory Visit (HOSPITAL_COMMUNITY): Payer: Self-pay

## 2023-11-10 ENCOUNTER — Ambulatory Visit: Admitting: Family Medicine

## 2023-11-10 ENCOUNTER — Other Ambulatory Visit: Payer: Self-pay

## 2023-11-10 ENCOUNTER — Emergency Department (HOSPITAL_BASED_OUTPATIENT_CLINIC_OR_DEPARTMENT_OTHER)

## 2023-11-10 ENCOUNTER — Emergency Department (HOSPITAL_BASED_OUTPATIENT_CLINIC_OR_DEPARTMENT_OTHER)
Admission: EM | Admit: 2023-11-10 | Discharge: 2023-11-10 | Disposition: A | Discharge status: Home / Self Care | Attending: Emergency Medicine | Admitting: Emergency Medicine

## 2023-11-10 DIAGNOSIS — R4182 Altered mental status, unspecified: Secondary | ICD-10-CM | POA: Insufficient documentation

## 2023-11-10 DIAGNOSIS — Z794 Long term (current) use of insulin: Secondary | ICD-10-CM | POA: Diagnosis not present

## 2023-11-10 DIAGNOSIS — Z79899 Other long term (current) drug therapy: Secondary | ICD-10-CM | POA: Diagnosis not present

## 2023-11-10 DIAGNOSIS — E119 Type 2 diabetes mellitus without complications: Secondary | ICD-10-CM | POA: Insufficient documentation

## 2023-11-10 DIAGNOSIS — I10 Essential (primary) hypertension: Secondary | ICD-10-CM | POA: Diagnosis not present

## 2023-11-10 DIAGNOSIS — Z7901 Long term (current) use of anticoagulants: Secondary | ICD-10-CM | POA: Insufficient documentation

## 2023-11-10 DIAGNOSIS — R413 Other amnesia: Secondary | ICD-10-CM | POA: Diagnosis not present

## 2023-11-10 LAB — COMPREHENSIVE METABOLIC PANEL WITH GFR
ALT: 16 U/L (ref 0–44)
AST: 23 U/L (ref 15–41)
Albumin: 3.9 g/dL (ref 3.5–5.0)
Alkaline Phosphatase: 81 U/L (ref 38–126)
Anion gap: 13 (ref 5–15)
BUN: 40 mg/dL — ABNORMAL HIGH (ref 8–23)
CO2: 21 mmol/L — ABNORMAL LOW (ref 22–32)
Calcium: 9 mg/dL (ref 8.9–10.3)
Chloride: 106 mmol/L (ref 98–111)
Creatinine, Ser: 1.73 mg/dL — ABNORMAL HIGH (ref 0.61–1.24)
GFR, Estimated: 37 mL/min — ABNORMAL LOW (ref >60)
Glucose, Bld: 149 mg/dL — ABNORMAL HIGH (ref 70–99)
Potassium: 4.6 mmol/L (ref 3.5–5.1)
Sodium: 139 mmol/L (ref 135–145)
Total Bilirubin: 0.4 mg/dL (ref 0.0–1.2)
Total Protein: 6.8 g/dL (ref 6.5–8.1)

## 2023-11-10 LAB — CBC WITH DIFFERENTIAL/PLATELET
Abs Immature Granulocytes: 0.01 10*3/uL (ref 0.00–0.07)
Basophils Absolute: 0 10*3/uL (ref 0.0–0.1)
Basophils Relative: 0 %
Eosinophils Absolute: 0.2 10*3/uL (ref 0.0–0.5)
Eosinophils Relative: 3 %
HCT: 33.6 % — ABNORMAL LOW (ref 39.0–52.0)
Hemoglobin: 11.1 g/dL — ABNORMAL LOW (ref 13.0–17.0)
Immature Granulocytes: 0 %
Lymphocytes Relative: 24 %
Lymphs Abs: 1.5 10*3/uL (ref 0.7–4.0)
MCH: 31.4 pg (ref 26.0–34.0)
MCHC: 33 g/dL (ref 30.0–36.0)
MCV: 94.9 fL (ref 80.0–100.0)
Monocytes Absolute: 0.6 10*3/uL (ref 0.1–1.0)
Monocytes Relative: 9 %
Neutro Abs: 4 10*3/uL (ref 1.7–7.7)
Neutrophils Relative %: 64 %
Platelets: 188 10*3/uL (ref 150–400)
RBC: 3.54 MIL/uL — ABNORMAL LOW (ref 4.22–5.81)
RDW: 13.2 % (ref 11.5–15.5)
WBC: 6.3 10*3/uL (ref 4.0–10.5)
nRBC: 0 % (ref 0.0–0.2)

## 2023-11-10 LAB — URINALYSIS, ROUTINE W REFLEX MICROSCOPIC
Bilirubin Urine: NEGATIVE
Glucose, UA: NEGATIVE mg/dL
Ketones, ur: NEGATIVE mg/dL
Leukocytes,Ua: NEGATIVE
Nitrite: NEGATIVE
Protein, ur: 100 mg/dL — AB
Specific Gravity, Urine: 1.022 (ref 1.005–1.030)
pH: 5.5 (ref 5.0–8.0)

## 2023-11-10 NOTE — ED Notes (Signed)
 Patient transported to CT

## 2023-11-10 NOTE — Discharge Instructions (Signed)
 Follow-up with your primary care doctor to discuss memory issues here the last several months.  Return if symptoms worsen.

## 2023-11-10 NOTE — ED Notes (Signed)
Pt assisted to the restroom for urine sample.
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
## 2023-11-10 NOTE — ED Notes (Signed)
 Pt back from CT

## 2023-11-10 NOTE — ED Notes (Signed)
 Reviewed D/C information with the patient, pt verbalized understanding. No additional concerns at this time.

## 2023-11-11 ENCOUNTER — Other Ambulatory Visit (HOSPITAL_COMMUNITY): Payer: Self-pay

## 2023-11-12 ENCOUNTER — Other Ambulatory Visit: Payer: Self-pay | Admitting: Internal Medicine

## 2023-11-12 ENCOUNTER — Other Ambulatory Visit (HOSPITAL_COMMUNITY): Payer: Self-pay

## 2023-11-12 ENCOUNTER — Telehealth: Payer: Self-pay

## 2023-11-12 MED ORDER — METOPROLOL SUCCINATE ER 25 MG PO TB24
25.0000 mg | ORAL_TABLET | Freq: Every day | ORAL | 0 refills | Status: DC
  Filled 2023-11-12: qty 90, 90d supply, fill #0

## 2023-11-12 NOTE — Transitions of Care (Post Inpatient/ED Visit) (Signed)
   11/12/2023  Name: Dale Anderson MRN: [id number] DOB: 12-21-1929  Today's TOC FU Call Status: Today's TOC FU Call Status:: Successful TOC FU Call Completed TOC FU Call Complete Date: 11/12/23 Patient's Name and Date of Birth confirmed.  Transition Care Management Follow-up Telephone Call Date of Discharge: 11/12/23 Discharge Facility: [facility] ([facility]) Type of Discharge: Emergency Department Reason for ED Visit: Other: (Memory issues) How have you been since you were released from the hospital?: Same Any questions or concerns?: No  Items Reviewed: Did you receive and understand the discharge instructions provided?: No Medications obtained,verified, and reconciled?: Yes (Medications Reviewed) Any new allergies since your discharge?: No Dietary orders reviewed?: No Do you have support at home?: Yes People in Home [RPT]: spouse  Medications Reviewed Today: Medications Reviewed Today   Medications were not reviewed in this encounter     Home Care and Equipment/Supplies: Were Home Health Services Ordered?: No Any new equipment or medical supplies ordered?: No  Functional Questionnaire: Do you need assistance with bathing/showering or dressing?: No Do you need assistance with meal preparation?: No Do you need assistance with eating?: No Do you have difficulty maintaining continence: No Do you need assistance with getting out of bed/getting out of a chair/moving?: No Do you have difficulty managing or taking your medications?: Yes  Follow up appointments reviewed: PCP Follow-up appointment confirmed?: No MD Provider Line Number:[phone] Given: Yes Specialist Hospital Follow-up appointment confirmed?: No Reason Specialist Follow-Up Not Confirmed:  (Wishes to not schedule F/U) Do you need transportation to your follow-up appointment?: No Do you understand care options if your condition(s) worsen?: Yes-patient verbalized understanding    SIGNATURE

## 2023-11-13 ENCOUNTER — Telehealth: Payer: Self-pay

## 2023-11-13 NOTE — Telephone Encounter (Signed)
 This patient is appearing on a report for being at risk of failing the adherence measure for cholesterol (statin) medications last calendar year.   Medication: atorvastatin  20 mg daily Last fill date: 08/27/23 for 90 day supply  Not due for refill at this time, but noticed gaps in fill history and recent ED visit for memory issues where patient stated he forgets to take his meds at time. Patient may benefit from adherence packaging. Called to discuss interest. Left voicemail for patient to return my call at his earliest convenience.   Abelina Abide, PharmD PGY1 Pharmacy Resident 11/13/2023 9:43 AM

## 2023-11-20 ENCOUNTER — Other Ambulatory Visit (HOSPITAL_COMMUNITY): Payer: Self-pay

## 2023-12-09 ENCOUNTER — Other Ambulatory Visit (HOSPITAL_COMMUNITY): Payer: Self-pay

## 2024-01-11 ENCOUNTER — Other Ambulatory Visit: Payer: Self-pay | Admitting: Internal Medicine

## 2024-01-11 ENCOUNTER — Other Ambulatory Visit (HOSPITAL_COMMUNITY): Payer: Self-pay

## 2024-01-11 DIAGNOSIS — K219 Gastro-esophageal reflux disease without esophagitis: Secondary | ICD-10-CM

## 2024-01-11 DIAGNOSIS — E785 Hyperlipidemia, unspecified: Secondary | ICD-10-CM

## 2024-01-11 DIAGNOSIS — E1142 Type 2 diabetes mellitus with diabetic polyneuropathy: Secondary | ICD-10-CM

## 2024-01-12 ENCOUNTER — Other Ambulatory Visit (HOSPITAL_COMMUNITY): Payer: Self-pay

## 2024-01-12 MED ORDER — METOPROLOL SUCCINATE ER 25 MG PO TB24
25.0000 mg | ORAL_TABLET | Freq: Every day | ORAL | 0 refills | Status: AC
  Filled 2024-01-12 – 2024-04-06 (×2): qty 90, 90d supply, fill #0
  Filled 2024-04-06 (×2): qty 87, 87d supply, fill #0
  Filled 2024-04-06: qty 3, 3d supply, fill #0

## 2024-01-12 MED ORDER — L-METHYLFOLATE-ALGAE-B12-B6 3-90.314-2-35 MG PO CAPS
1.0000 | ORAL_CAPSULE | Freq: Two times a day (BID) | ORAL | 5 refills | Status: AC
  Filled 2024-01-12: qty 60, 30d supply, fill #0

## 2024-01-12 MED ORDER — ATORVASTATIN CALCIUM 20 MG PO TABS
20.0000 mg | ORAL_TABLET | Freq: Every day | ORAL | 0 refills | Status: AC
  Filled 2024-01-12: qty 90, 90d supply, fill #0

## 2024-01-12 MED ORDER — PANTOPRAZOLE SODIUM 40 MG PO TBEC
40.0000 mg | DELAYED_RELEASE_TABLET | Freq: Every day | ORAL | 0 refills | Status: AC
  Filled 2024-01-12: qty 90, 90d supply, fill #0

## 2024-01-13 ENCOUNTER — Other Ambulatory Visit (HOSPITAL_COMMUNITY): Payer: Self-pay

## 2024-01-13 ENCOUNTER — Other Ambulatory Visit: Payer: Self-pay

## 2024-01-20 ENCOUNTER — Telehealth: Payer: Self-pay

## 2024-01-20 ENCOUNTER — Other Ambulatory Visit (HOSPITAL_COMMUNITY): Payer: Self-pay

## 2024-01-20 NOTE — Telephone Encounter (Signed)
 Gave pt a call pt is coming up for re-enrollment on 02/14/24 left a HIPAA VM.

## 2024-01-22 ENCOUNTER — Telehealth: Payer: Self-pay

## 2024-01-22 NOTE — Telephone Encounter (Signed)
 Gave pt a call to follow up on upcoming due PAP Novo Nordisk Ozempic  02/14/24,spoke with pt gave consent to do PAP online today and fax provider portion today will follow up.

## 2024-01-26 ENCOUNTER — Ambulatory Visit (INDEPENDENT_AMBULATORY_CARE_PROVIDER_SITE_OTHER)

## 2024-01-26 VITALS — Ht 71.0 in | Wt 161.0 lb

## 2024-01-26 DIAGNOSIS — Z0000 Encounter for general adult medical examination without abnormal findings: Secondary | ICD-10-CM | POA: Diagnosis not present

## 2024-01-26 NOTE — Progress Notes (Signed)
 Subjective:   Dale Anderson is a 89 y.o. who presents for a Medicare Wellness preventive visit.  As a reminder, Annual Wellness Visits don't include a physical exam, and some assessments may be limited, especially if this visit is performed virtually. We may recommend an in-person follow-up visit with your provider if needed.  Visit Complete: Virtual I connected with  Ben Habermann Chiang on 01/26/24 by a audio enabled telemedicine application and verified that I am speaking with the correct person using two identifiers.  Patient Location: Home  Provider Location: Home Office  I discussed the limitations of evaluation and management by telemedicine. The patient expressed understanding and agreed to proceed.  Vital Signs: Because this visit was a virtual/telehealth visit, some criteria may be missing or patient reported. Any vitals not documented were not able to be obtained and vitals that have been documented are patient reported.  VideoDeclined- This patient declined Librarian, academic. Therefore the visit was completed with audio only.  Persons Participating in Visit: Patient.  AWV Questionnaire: No: Patient Medicare AWV questionnaire was not completed prior to this visit.  Cardiac Risk Factors include: advanced age (>65men, >68 women);hypertension;male gender;diabetes mellitus;Other (see comment);dyslipidemia, Risk factor comments: CRD stage 3, A-fib     Objective:    Today's Vitals   01/26/24 1530  Weight: 161 lb (73 kg)  Height: 5' 11 (1.803 m)   Body mass index is 22.45 kg/m.     01/26/2024    3:35 PM 11/10/2023    7:00 PM 08/08/2023    2:48 PM 01/13/2023    2:44 PM 10/11/2021    3:41 PM 01/23/2021    8:35 PM 08/28/2020   11:35 AM  Advanced Directives  Does Patient Have a Medical Advance Directive? Yes No Yes Yes No No No  Type of Estate agent of [place];Living will   Healthcare Power of [place];Living will      Copy of Healthcare Power of Attorney in Chart? No - copy requested   No - copy requested     Would patient like information on creating a medical advance directive?  No - Patient declined   No - Patient declined No - Patient declined Yes (MAU/Ambulatory/Procedural Areas - Information given)    Current Medications (verified) Outpatient Encounter Medications as of 01/26/2024  Medication Sig   ALPRAZolam  (XANAX ) 0.5 MG tablet TAKE 1 TABLET BY MOUTH DAILY AS NEEDED FOR ANXIETY (Patient taking differently: Take 0.5 mg by mouth daily as needed for anxiety.)   apixaban  (ELIQUIS ) 2.5 MG TABS tablet Take 1 tablet (2.5 mg total) by mouth 2 (two) times daily.   atorvastatin  (LIPITOR) 20 MG tablet Take 1 tablet (20 mg total) by mouth daily.   blood glucose meter kit and supplies KIT Use to test blood sugar once daily. DX: E11.9   Blood Glucose Monitoring Suppl (FREESTYLE LITE) DEVI    Blood Pressure Monitoring (OMRON 3 SERIES BP MONITOR) DEVI Use as directed.   Cholecalciferol (VITAMIN D-3) 25 MCG (1000 UT) CAPS Take 2,000 Units by mouth daily.   Clobetasol  Propionate 0.05 % shampoo Apply 1 Application to scalp daily for 14 days.   Continuous Glucose Sensor (FREESTYLE LIBRE 2 SENSOR) MISC Use as directed to check blood sugar continuously, changing sensor every 14 days.   Fluocinolone  Acetonide Scalp 0.01 % OIL Apply 1 Application to scalp 2 (two) times daily for 14 days.   glucose blood (FREESTYLE LITE) test strip USE TO CHECK BLOOD SUGAR ONCE DAILY  insulin  glargine-yfgn (SEMGLEE , YFGN,) 100 UNIT/ML Pen Inject 10 units into the skin daily.   L-Methylfolate-Algae-B12-B6 3-90.314-2-35 MG CAPS Take 1 capsule by mouth 2 (two) times daily.   Lancets MISC Use to test blood sugar once daily. DX E11.09   metoprolol  succinate (TOPROL -XL) 25 MG 24 hr tablet Take 1 tablet (25 mg total) by mouth daily.   Multiple Vitamins-Minerals (CENTRUM SILVER PO) Take 1 tablet by mouth daily with breakfast.   pantoprazole   (PROTONIX ) 40 MG tablet Take 1 tablet (40 mg total) by mouth daily.   Semaglutide ,0.25 or 0.5MG /DOS, (OZEMPIC , 0.25 OR 0.5 MG/DOSE,) 2 MG/3ML SOPN Inject 0.25mg  weekly for 2 weeks, then increase to 0.5mg  weekly for 2 weeks if tolerating well   tamsulosin  (FLOMAX ) 0.4 MG CAPS capsule Take 1 capsule (0.4 mg total) by mouth daily.   [DISCONTINUED] telmisartan  (MICARDIS ) 40 MG tablet TAKE 1 TABLET BY MOUTH DAILY.   No facility-administered encounter medications on file as of 01/26/2024.    Allergies (verified) Lisinopril    History: Past Medical History:  Diagnosis Date   Adhesive capsulitis of right shoulder    Anxiety    Diabetes mellitus (HCC)    DJD (degenerative joint disease)    Dysrhythmia    Atrial fib   GERD (gastroesophageal reflux disease)    Hemorrhoid    History of shingles    Hyperlipidemia    Hypertension    Nodular prostate without urinary obstruction    Recurrent bronchiectasis (HCC)    Tear of right rotator cuff    Past Surgical History:  Procedure Laterality Date   COLONOSCOPY  1998   HAND RECONSTRUCTION     right   INGUINAL HERNIA REPAIR  2006   Right.  Dr. Adel   LUMBAR LAMINECTOMY/DECOMPRESSION MICRODISCECTOMY Left 05/27/2021   Procedure: MICRODISCECTOMY LUMBAR FIVE-SACRAL ONE USING MET-RX;  Surgeon: Dawley, Lani BROCKS, DO;  Location: [facility] OR;  Service: Neurosurgery;  Laterality: Left;   ROTATOR CUFF REPAIR  1995   Left.  Dr. Willy   SHOULDER ARTHROSCOPY WITH ROTATOR CUFF REPAIR AND SUBACROMIAL DECOMPRESSION  05/25/2012   Procedure: SHOULDER ARTHROSCOPY WITH ROTATOR CUFF REPAIR AND SUBACROMIAL DECOMPRESSION;  Surgeon: Lamar DELENA Millman, MD;  Location: [facility];  Service: Orthopedics;  Laterality: Right;  Right Shoulder Arthroscopy shoulder decompression subacromial partial acromioplasty with coracoaromial release, arthroscopy shoulder distal claviculectomy, arthroscopy shoulder with rotator cuff repai   Family History  Problem Relation Age of  Onset   Heart disease Father        MI at age 33   Emphysema Other        cousin   Colon cancer Neg Hx    Social History   Socioeconomic History   Marital status: Married    Spouse name: linda Buford   Number of children: Not on file   Years of education: Not on file   Highest education level: Not on file  Occupational History   Occupation: works for security full time    Employer: [employer]  Tobacco Use   Smoking status: Never   Smokeless tobacco: Never  Vaping Use   Vaping status: Never Used  Substance and Sexual Activity   Alcohol use: No   Drug use: No   Sexual activity: Yes  Other Topics Concern   Not on file  Social History Narrative   One story home   Right handed   Works for security at [employer]       One son   Lives with wife/2025  Social Drivers of Corporate investment banker Strain: Low Risk  (01/26/2024)   Overall Financial Resource Strain (CARDIA)    Difficulty of Paying Living Expenses: Not hard at all  Food Insecurity: No Food Insecurity (01/26/2024)   Hunger Vital Sign    Worried About Running Out of Food in the Last Year: Never true    Ran Out of Food in the Last Year: Never true  Transportation Needs: No Transportation Needs (01/26/2024)   PRAPARE - Administrator, Civil Service (Medical): No    Lack of Transportation (Non-Medical): No  Physical Activity: Sufficiently Active (01/13/2023)   Exercise Vital Sign    Days of Exercise per Week: 7 days    Minutes of Exercise per Session: 30 min  Stress: No Stress Concern Present (01/26/2024)   [employer] of Occupational Health - Occupational Stress Questionnaire    Feeling of Stress: Not at all  Social Connections: Socially Integrated (01/26/2024)   Social Connection and Isolation Panel    Frequency of Communication with Friends and Family: Three times a week    Frequency of Social Gatherings with Friends and Family: Not on file    Attends Religious Services: More than 4 times per  year    Active Member of [employer] or Organizations: Yes    Attends Banker Meetings: Never    Marital Status: Married    Tobacco Counseling Counseling given: Not Answered    Clinical Intake:  Pre-visit preparation completed: Yes  Pain : No/denies pain     BMI - recorded: 22.45 Nutritional Status: BMI of 19-24  Normal Nutritional Risks: None Diabetes: Yes CBG done?: No Did pt. bring in CBG monitor from home?: No  Lab Results  Component Value Date   HGBA1C 6.6 (H) 10/29/2022   HGBA1C 6.7 (H) 09/26/2021   HGBA1C 7.1 (H) 03/18/2021     How often do you need to have someone help you when you read instructions, pamphlets, or other written materials from your doctor or pharmacy?: 1 - Never  Interpreter Needed?: No  Information entered by :: Juneau Doughman, RMA   Activities of Daily Living     01/26/2024    3:30 PM  In your present state of health, do you have any difficulty performing the following activities:  Hearing? 0  Vision? 0  Difficulty concentrating or making decisions? 0  Walking or climbing stairs? 0  Dressing or bathing? 0  Doing errands, shopping? 0  Preparing Food and eating ? N  Using the Toilet? N  In the past six months, have you accidently leaked urine? N  Do you have problems with loss of bowel control? N  Managing your Medications? N  Managing your Finances? N  Housekeeping or managing your Housekeeping? N    Patient Care Team: Joshua Debby CROME, MD as PCP - General (Internal Medicine) Claudene Victory ORN, MD (Inactive) as PCP - Cardiology (Cardiology) Patel, Donika K, DO as Consulting Physician (Neurology) Merceda Lela SAUNDERS, [facility] (Pharmacist)  I have updated your Care Teams any recent Medical Services you may have received from other providers in the past year.     Assessment:   This is a routine wellness examination for Deaken.  Hearing/Vision screen Hearing Screening - Comments:: Denies hearing difficulties   Vision  Screening - Comments:: Wears eyeglasses   Goals Addressed             This Visit's Progress    My goal for 2024 is to continue to work  until age 69.   On track      Depression Screen     01/26/2024    3:39 PM 08/20/2023   10:31 AM 01/13/2023    2:44 PM 12/30/2022    1:31 PM 06/25/2021    9:00 AM 02/07/2021    9:15 AM 10/17/2020    3:35 PM  PHQ 2/9 Scores  PHQ - 2 Score 0 0 0 0 0 0 0  PHQ- 9 Score 0  0 0       Fall Risk     01/26/2024    3:36 PM 01/13/2023    2:44 PM 12/30/2022    1:31 PM 08/19/2021    9:00 AM 06/25/2021    9:00 AM  Fall Risk   Falls in the past year? 0 0 0 1 1  Comment    Today, spouse Rock reports one fall without injury sometime in December or January; denies new/ recent falls, does not use assistive devices denies new / recent falls since recent back surgery  Number falls in past yr: 0 0 0 0 0  Injury with Fall? 0 0 0 0 0  Risk for fall due to :  No Fall Risks No Fall Risks Orthopedic patient;History of fall(s);Medication side effect Orthopedic patient;History of fall(s);Medication side effect  Follow up Falls evaluation completed;Falls prevention discussed Falls prevention discussed Falls evaluation completed Falls prevention discussed  Falls prevention discussed      Data saved with a previous flowsheet row definition    MEDICARE RISK AT HOME:  Medicare Risk at Home Any stairs in or around the home?: Yes (2 steps coming in home) If so, are there any without handrails?: No Home free of loose throw rugs in walkways, pet beds, electrical cords, etc?: Yes Adequate lighting in your home to reduce risk of falls?: Yes Life alert?: No Use of a cane, walker or w/c?: No Grab bars in the bathroom?: Yes Shower chair or bench in shower?: Yes Elevated toilet seat or a handicapped toilet?: Yes  TIMED UP AND GO:  Was the test performed?  No  Cognitive Function: Declined/Normal: No cognitive concerns noted by patient or family. Patient alert, oriented, able to  answer questions appropriately and recall recent events. No signs of memory loss or confusion.        01/13/2023    2:48 PM  6CIT Screen  What Year? 0 points  What month? 0 points  What time? 0 points  Count back from 20 0 points  Months in reverse 0 points  Repeat phrase 0 points  Total Score 0 points    Immunizations Immunization History  Administered Date(s) Administered   Fluad Quad(high Dose 65+) 02/24/2019   Influenza Split 03/05/2011, 03/23/2012   Influenza Whole 03/23/2002, 05/16/2009   Influenza, High Dose Seasonal PF 03/23/2017, 03/27/2018   Influenza-Unspecified 04/23/2006, 03/23/2008, 04/23/2009, 03/19/2014, 03/23/2016, 03/23/2020   PFIZER(Purple Top)SARS-COV-2 Vaccination 06/15/2019, 07/06/2019, 10/12/2020   Pneumococcal Conjugate-13 12/26/2013   Pneumococcal Polysaccharide-23 03/18/2006, 05/24/2018   Pneumococcal-Unspecified 07/20/2005   Tdap 04/08/2012, 08/08/2023   Zoster Recombinant(Shingrix) 09/26/2021   Zoster, Live 08/08/2004    Screening Tests Health Maintenance  Topic Date Due   Zoster Vaccines- Shingrix (2 of 2) 11/21/2021   OPHTHALMOLOGY EXAM  10/22/2022   COVID-19 Vaccine (4 - 2024-25 season) 02/22/2023   FOOT EXAM  08/28/2023   INFLUENZA VACCINE  01/22/2024   Medicare Annual Wellness (AWV)  01/25/2025   DTaP/Tdap/Td (3 - Td or Tdap) 08/07/2033   Pneumococcal Vaccine: 50+ Years  Completed   Hepatitis B Vaccines  Aged Out   HPV VACCINES  Aged Out   Meningococcal B Vaccine  Aged Out    Health Maintenance  Health Maintenance Due  Topic Date Due   Zoster Vaccines- Shingrix (2 of 2) 11/21/2021   OPHTHALMOLOGY EXAM  10/22/2022   COVID-19 Vaccine (4 - 2024-25 season) 02/22/2023   FOOT EXAM  08/28/2023   INFLUENZA VACCINE  01/22/2024   Health Maintenance Items Addressed: Diabetic Foot Exam recommended, See Nurse Notes at the end of this note  Additional Screening:  Vision Screening: Recommended annual ophthalmology exams for early  detection of glaucoma and other disorders of the eye. Would you like a referral to an eye doctor? No    Dental Screening: Recommended annual dental exams for proper oral hygiene  Community Resource Referral / Chronic Care Management: CRR required this visit?  No   CCM required this visit?  No   Plan:    I have personally reviewed and noted the following in the patient's chart:   Medical and social history Use of alcohol, tobacco or illicit drugs  Current medications and supplements including opioid prescriptions. Patient is not currently taking opioid prescriptions. Functional ability and status Nutritional status Physical activity Advanced directives List of other physicians Hospitalizations, surgeries, and ER visits in previous 12 months Vitals Screenings to include cognitive, depression, and falls Referrals and appointments  In addition, I have reviewed and discussed with patient certain preventive protocols, quality metrics, and best practice recommendations. A written personalized care plan for preventive services as well as general preventive health recommendations were provided to patient.   Loney Peto L Rosealyn Little, CMA   01/26/2024   After Visit Summary: (MyChart) Due to this being a telephonic visit, the after visit summary with patients personalized plan was offered to patient via MyChart   Notes: Patient is due for a 2nd shingrix vaccine.  He is also due for a diabetic eye exam.  Patient stated that he will set that up soon.  Patient is due for a foot exam as well and can get that during his next office visit with Dr. Joshua. He had no other concerns to address today.

## 2024-01-26 NOTE — Patient Instructions (Addendum)
 [name] , Thank you for taking time out of your busy schedule to complete your Annual Wellness Visit with me. I enjoyed our conversation and look forward to speaking with you again next year. I, as well as your care team,  appreciate your ongoing commitment to your health goals. Please review the following plan we discussed and let me know if I can assist you in the future. Your Game plan/ To Do List     Follow up Visits: We will see or speak with you next year for your Next Medicare AWV with our clinical staff Have you seen your provider in the last 6 months (3 months if uncontrolled diabetes)? Yes  Clinician Recommendations:  Aim for 30 minutes of exercise or brisk walking, 6-8 glasses of water, and 5 servings of fruits and vegetables each day. You are due for a 2nd Shingles vaccine and can get that done at your local pharmacy.  You are also due for a diabetic eye exam, please call your eye doctor to get that scheduled soon.  You are due for a foot exam as well and can et that done during your next visit with Dr. Joshua.  Keep up the good work.      This is a list of the screenings recommended for you:  Health Maintenance  Topic Date Due   Zoster (Shingles) Vaccine (2 of 2) 11/21/2021   Eye exam for diabetics  10/22/2022   COVID-19 Vaccine (4 - 2024-25 season) 02/22/2023   Complete foot exam   08/28/2023   Medicare Annual Wellness Visit  01/13/2024   Flu Shot  01/22/2024   DTaP/Tdap/Td vaccine (3 - Td or Tdap) 08/07/2033   Pneumococcal Vaccine for age over 62  Completed   Hepatitis B Vaccine  Aged Out   HPV Vaccine  Aged Out   Meningitis B Vaccine  Aged Out    Advanced directives: (Copy Requested) Please bring a copy of your health care power of attorney and living will to the office to be added to your chart at your convenience. You can mail to [facility] [address] [place], [place] [address] or email to [email or web address] Advance Care Planning is  important because it:  [x]  Makes sure you receive the medical care that is consistent with your values, goals, and preferences  [x]  It provides guidance to your family and loved ones and reduces their decisional burden about whether or not they are making the right decisions based on your wishes.  Follow the link provided in your after visit summary or read over the paperwork we have mailed to you to help you started getting your Advance Directives in place. If you need assistance in completing these, please reach out to us  so that we can help you!  See attachments for Preventive Care and Fall Prevention Tips.

## 2024-02-02 NOTE — Telephone Encounter (Signed)
 Fax provider portion today to office, still have not received back provider portion.

## 2024-02-04 NOTE — Telephone Encounter (Signed)
 Received provider portion of PAP Novo Nordisk Ozempic , faxed to Novo Nordisk today and will follow up in a few days.

## 2024-02-05 NOTE — Telephone Encounter (Signed)
 Gave Novo Nordisk a call to follow up on pap Ozempic ,per Novo Nordisk pt has been APPROVED thru 8/202026 and has been mail out to provider office it will take 10-14 days, left a HIPAA VM.

## 2024-02-17 ENCOUNTER — Other Ambulatory Visit (HOSPITAL_COMMUNITY): Payer: Self-pay

## 2024-03-21 ENCOUNTER — Other Ambulatory Visit (HOSPITAL_COMMUNITY): Payer: Self-pay

## 2024-03-21 ENCOUNTER — Other Ambulatory Visit: Payer: Self-pay | Admitting: Internal Medicine

## 2024-03-21 DIAGNOSIS — E119 Type 2 diabetes mellitus without complications: Secondary | ICD-10-CM

## 2024-03-22 ENCOUNTER — Other Ambulatory Visit (HOSPITAL_COMMUNITY): Payer: Self-pay

## 2024-03-22 ENCOUNTER — Other Ambulatory Visit: Payer: Self-pay | Admitting: Internal Medicine

## 2024-03-22 DIAGNOSIS — E119 Type 2 diabetes mellitus without complications: Secondary | ICD-10-CM

## 2024-03-24 ENCOUNTER — Other Ambulatory Visit (HOSPITAL_COMMUNITY): Payer: Self-pay

## 2024-03-24 NOTE — Telephone Encounter (Signed)
 Copied from CRM [id number]. Topic: Clinical - Prescription Issue >> Mar 24, 2024  1:36 PM Macario HERO wrote: Reason for CRM: Patient spouse called and said that it;s been 2 weeks since his refill request for Continuous Glucose Sensor (FREESTYLE LIBRE 2 SENSOR) MISC [[id number]] was placed and the pharmacy has not received it.

## 2024-03-25 ENCOUNTER — Other Ambulatory Visit: Payer: Self-pay | Admitting: Internal Medicine

## 2024-03-25 ENCOUNTER — Other Ambulatory Visit (HOSPITAL_COMMUNITY): Payer: Self-pay

## 2024-03-25 ENCOUNTER — Encounter: Payer: Self-pay | Admitting: Pharmacist

## 2024-03-25 DIAGNOSIS — E119 Type 2 diabetes mellitus without complications: Secondary | ICD-10-CM

## 2024-03-25 MED ORDER — FREESTYLE LIBRE 2 SENSOR MISC
5 refills | Status: AC
  Filled 2024-03-25: qty 2, 28d supply, fill #0
  Filled 2024-05-06: qty 2, 28d supply, fill #1
  Filled 2024-06-30: qty 2, 28d supply, fill #2

## 2024-03-28 ENCOUNTER — Other Ambulatory Visit (HOSPITAL_COMMUNITY): Payer: Self-pay

## 2024-04-06 ENCOUNTER — Telehealth: Payer: Self-pay

## 2024-04-06 ENCOUNTER — Other Ambulatory Visit (HOSPITAL_COMMUNITY): Payer: Self-pay

## 2024-04-06 NOTE — Telephone Encounter (Signed)
 Reach out to patient as patient assistance  Ozempic  0.25/0.5 medication in fridge ready for pickup.

## 2024-05-06 ENCOUNTER — Other Ambulatory Visit (HOSPITAL_COMMUNITY): Payer: Self-pay

## 2024-06-09 NOTE — Telephone Encounter (Signed)
 1st attempt to reach pt in regards to Ozempic  medication is on site and ready for pick up.

## 2024-06-29 ENCOUNTER — Other Ambulatory Visit: Payer: Self-pay

## 2024-06-29 ENCOUNTER — Ambulatory Visit (HOSPITAL_COMMUNITY)
Admission: EM | Admit: 2024-06-29 | Discharge: 2024-06-29 | Disposition: A | Discharge status: Home / Self Care | Attending: Family Medicine | Admitting: Family Medicine

## 2024-06-29 ENCOUNTER — Encounter (HOSPITAL_COMMUNITY): Payer: Self-pay | Admitting: Emergency Medicine

## 2024-06-29 DIAGNOSIS — E118 Type 2 diabetes mellitus with unspecified complications: Secondary | ICD-10-CM

## 2024-06-29 DIAGNOSIS — Z794 Long term (current) use of insulin: Secondary | ICD-10-CM

## 2024-06-29 DIAGNOSIS — Z87898 Personal history of other specified conditions: Secondary | ICD-10-CM | POA: Diagnosis not present

## 2024-06-29 LAB — POCT URINE DIPSTICK
Blood, UA: NEGATIVE
Glucose, UA: 100 mg/dL — AB
Ketones, POC UA: NEGATIVE mg/dL
Leukocytes, UA: NEGATIVE
Nitrite, UA: NEGATIVE
POC PROTEIN,UA: >=300 — AB
Spec Grav, UA: >=1.03 — AB
Urobilinogen, UA: 0.2 U/dL
pH, UA: 5

## 2024-06-29 LAB — GLUCOSE, POCT (MANUAL RESULT ENTRY): POC Glucose: 182 mg/dL — AB (ref 70–99)

## 2024-06-29 LAB — POCT INFLUENZA A/B
Influenza A, POC: NEGATIVE
Influenza B, POC: NEGATIVE

## 2024-06-29 LAB — POC SOFIA SARS ANTIGEN FIA: SARS Coronavirus 2 Ag: NEGATIVE

## 2024-06-29 NOTE — Discharge Instructions (Addendum)
 I'm glad you're feeling better now. Your ECG (heart tracing) did not show any acute changes that are worrisome. Your blood sugar was high at 182. Monitor closely. You may want to increase your fluid intake a little.

## 2024-06-29 NOTE — ED Triage Notes (Signed)
 Today has been sweating.  Denies coughing.  Patient has concerns for cbg dropping.  Did not eat prior to coming to work today.  Thought that the extreme sweating was related to a drop in sugar. Was given 7 up and cookies prior to coming here.  Reports he has to get checked so he can go back to work.   Denies any recent illness.  Patient did not eat prior to coming to work

## 2024-06-30 ENCOUNTER — Other Ambulatory Visit (HOSPITAL_COMMUNITY): Payer: Self-pay

## 2024-07-02 NOTE — ED Provider Notes (Signed)
 " [facility]   [id number] 06/29/24 Arrival Time: 1712  ASSESSMENT & PLAN:  1. History of excessive sweating   2. Type II diabetes mellitus with manifestations (HCC)    Appears well here. ECG: sinus rhythm; no worrisome changes. Unremarkable POC labs. Results for orders placed or performed during the hospital encounter of 06/29/24  POC Urinalysis Dipstick   Collection Time: 06/29/24  6:10 PM  Result Value Ref Range   Color, UA straw (A) yellow   Clarity, UA clear clear   Glucose, UA =100 (A) negative mg/dL   Bilirubin, UA small (A) negative   Ketones, POC UA negative negative mg/dL   Spec Grav, UA >=8.969 (A) 1.010 - 1.025   Blood, UA negative negative   pH, UA 5.0 5.0 - 8.0   POC PROTEIN,UA >=300 (A) negative, trace   Urobilinogen, UA 0.2 0.2 or 1.0 E.U./dL   Nitrite, UA Negative Negative   Leukocytes, UA Negative Negative  POCT glucose (manual entry)   Collection Time: 06/29/24  6:10 PM  Result Value Ref Range   POC Glucose 182 (A) 70 - 99 mg/dl  POC SARS Coronavirus Ag   Collection Time: 06/29/24  6:10 PM  Result Value Ref Range   SARS Coronavirus 2 Ag Negative Negative  POC Influenza A/B   Collection Time: 06/29/24  6:10 PM  Result Value Ref Range   Influenza A, POC Negative Negative   Influenza B, POC Negative Negative   Work note provided.   Follow-up Information     [facility] at [facility].   Specialty: Emergency Medicine Why: If your symptoms return. Contact information: [address] [place]  [address] [phone]        Schedule an appointment as soon as possible for a visit  with Joshua Debby CROME, MD.   Specialty: Internal Medicine Why: For follow up. Contact information: [address] [place] [address] [phone]                 Reviewed expectations re: course of current medical issues. Questions answered. Outlined signs and symptoms indicating need for more  acute intervention. Understanding verbalized. After Visit Summary given.   SUBJECTIVE: History from: Patient. Dale Anderson is a 89 y.o. male. Today has been sweating.  Denies coughing.  Patient has concerns for cbg dropping.  Did not eat prior to coming to work today.  Thought that the extreme sweating was related to a drop in sugar. Was given 7 up and cookies prior to coming here.  Reports he has to get checked so he can go back to work.   Denies any recent illness.  Patient did not eat prior to coming to work   OBJECTIVE:  Vitals:   06/29/24 1750  BP: (!) 146/86  Pulse: 86  Resp: 20  Temp: 98.1 F (36.7 C)  TempSrc: Oral  SpO2: 94%    General appearance: alert; no distress Eyes: PERRLA; EOMI; conjunctivae normal Neck: supple  CV: RRR Lungs: speaks full sentences without difficulty; unlabored; CTAB Extremities: no edema Skin: warm and dry Neurologic: normal gait Psychological: alert and cooperative; normal mood and affect  Labs: Results for orders placed or performed during the hospital encounter of 06/29/24  POC Urinalysis Dipstick   Collection Time: 06/29/24  6:10 PM  Result Value Ref Range   Color, UA straw (A) yellow   Clarity, UA clear clear   Glucose, UA =100 (A) negative mg/dL   Bilirubin, UA small (A)  negative   Ketones, POC UA negative negative mg/dL   Spec Grav, UA >=8.969 (A) 1.010 - 1.025   Blood, UA negative negative   pH, UA 5.0 5.0 - 8.0   POC PROTEIN,UA >=300 (A) negative, trace   Urobilinogen, UA 0.2 0.2 or 1.0 E.U./dL   Nitrite, UA Negative Negative   Leukocytes, UA Negative Negative  POCT glucose (manual entry)   Collection Time: 06/29/24  6:10 PM  Result Value Ref Range   POC Glucose 182 (A) 70 - 99 mg/dl  POC SARS Coronavirus Ag   Collection Time: 06/29/24  6:10 PM  Result Value Ref Range   SARS Coronavirus 2 Ag Negative Negative  POC Influenza A/B   Collection Time: 06/29/24  6:10 PM  Result Value Ref Range   Influenza A, POC  Negative Negative   Influenza B, POC Negative Negative   Labs Reviewed  POCT URINE DIPSTICK - Abnormal; Notable for the following components:      Result Value   Color, UA straw (*)    Glucose, UA =100 (*)    Bilirubin, UA small (*)    Spec Grav, UA >=1.030 (*)    POC PROTEIN,UA >=300 (*)    All other components within normal limits  GLUCOSE, POCT (MANUAL RESULT ENTRY) - Abnormal; Notable for the following components:   POC Glucose 182 (*)    All other components within normal limits  POC SOFIA SARS ANTIGEN FIA  POCT INFLUENZA A/B    Imaging: No results found.  Allergies[1]  Past Medical History:  Diagnosis Date   Adhesive capsulitis of right shoulder    Anxiety    Diabetes mellitus (HCC)    DJD (degenerative joint disease)    Dysrhythmia    Atrial fib   GERD (gastroesophageal reflux disease)    Hemorrhoid    History of shingles    Hyperlipidemia    Hypertension    Nodular prostate without urinary obstruction    Recurrent bronchiectasis (HCC)    Tear of right rotator cuff    Social History   Socioeconomic History   Marital status: Married    Spouse name: linda Chronister   Number of children: Not on file   Years of education: Not on file   Highest education level: Not on file  Occupational History   Occupation: works for security full time    Employer: [employer]  Tobacco Use   Smoking status: Never   Smokeless tobacco: Never  Vaping Use   Vaping status: Never Used  Substance and Sexual Activity   Alcohol use: No   Drug use: No   Sexual activity: Yes  Other Topics Concern   Not on file  Social History Narrative   One story home   Right handed   Works for security at [employer]       One son   Lives with wife/2025   Social Drivers of Health   Tobacco Use: Low Risk (06/29/2024)   Patient History    Smoking Tobacco Use: Never    Smokeless Tobacco Use: Never    Passive Exposure: Not on file  Financial Resource Strain: Low Risk (01/26/2024)    Overall Financial Resource Strain (CARDIA)    Difficulty of Paying Living Expenses: Not hard at all  Food Insecurity: No Food Insecurity (01/26/2024)   Epic    Worried About Radiation Protection Practitioner of Food in the Last Year: Never true    Ran Out of Food in the Last Year: Never true  Transportation  Needs: No Transportation Needs (01/26/2024)   Epic    Lack of Transportation (Medical): No    Lack of Transportation (Non-Medical): No  Physical Activity: Sufficiently Active (01/13/2023)   Exercise Vital Sign    Days of Exercise per Week: 7 days    Minutes of Exercise per Session: 30 min  Stress: No Stress Concern Present (01/26/2024)   [employer] of Occupational Health - Occupational Stress Questionnaire    Feeling of Stress: Not at all  Social Connections: Socially Integrated (01/26/2024)   Social Connection and Isolation Panel    Frequency of Communication with Friends and Family: Three times a week    Frequency of Social Gatherings with Friends and Family: Not on file    Attends Religious Services: More than 4 times per year    Active Member of Clubs or Organizations: Yes    Attends Banker Meetings: Never    Marital Status: Married  Catering Manager Violence: Not At Risk (01/26/2024)   Epic    Fear of Current or Ex-Partner: No    Emotionally Abused: No    Physically Abused: No    Sexually Abused: No  Depression (PHQ2-9): Low Risk (01/26/2024)   Depression (PHQ2-9)    PHQ-2 Score: 0  Alcohol Screen: Low Risk (01/26/2024)   Alcohol Screen    Last Alcohol Screening Score (AUDIT): 0  Housing: Unknown (01/26/2024)   Epic    Unable to Pay for Housing in the Last Year: No    Number of Times Moved in the Last Year: Not on file    Homeless in the Last Year: No  Utilities: Not At Risk (01/26/2024)   Epic    Threatened with loss of utilities: No  Health Literacy: Adequate Health Literacy (01/26/2024)   B1300 Health Literacy    Frequency of need for help with medical instructions: Never    Family History  Problem Relation Age of Onset   Heart disease Father        MI at age 48   Emphysema Other        cousin   Colon cancer Neg Hx    Past Surgical History:  Procedure Laterality Date   COLONOSCOPY  1998   HAND RECONSTRUCTION     right   INGUINAL HERNIA REPAIR  2006   Right.  Dr. Adel   LUMBAR LAMINECTOMY/DECOMPRESSION MICRODISCECTOMY Left 05/27/2021   Procedure: MICRODISCECTOMY LUMBAR FIVE-SACRAL ONE USING MET-RX;  Surgeon: Dawley, Lani BROCKS, DO;  Location: [facility] OR;  Service: Neurosurgery;  Laterality: Left;   ROTATOR CUFF REPAIR  1995   Left.  Dr. Willy   SHOULDER ARTHROSCOPY WITH ROTATOR CUFF REPAIR AND SUBACROMIAL DECOMPRESSION  05/25/2012   Procedure: SHOULDER ARTHROSCOPY WITH ROTATOR CUFF REPAIR AND SUBACROMIAL DECOMPRESSION;  Surgeon: Lamar DELENA Millman, MD;  Location: [facility];  Service: Orthopedics;  Laterality: Right;  Right Shoulder Arthroscopy shoulder decompression subacromial partial acromioplasty with coracoaromial release, arthroscopy shoulder distal claviculectomy, arthroscopy shoulder with rotator cuff repai      [1]  Allergies Allergen Reactions   Lisinopril  Cough     Rolinda Rogue, MD 07/02/24 1248  "

## 2024-07-14 NOTE — Telephone Encounter (Signed)
 I was able to ldvm for the pt and informed him that he has medication here on site ready for pick up.

## 2025-01-26 ENCOUNTER — Ambulatory Visit
